# Patient Record
Sex: Female | Born: 1960 | Race: White | Hispanic: No | Marital: Single | Smoking: Former smoker
Health system: Southern US, Community
[De-identification: ages and names within clinical notes are randomized; demographics above are authoritative.]

## PROBLEM LIST (undated history)

## (undated) DIAGNOSIS — Z972 Presence of dental prosthetic device (complete) (partial): Secondary | ICD-10-CM

## (undated) DIAGNOSIS — R519 Headache, unspecified: Secondary | ICD-10-CM

## (undated) DIAGNOSIS — G40309 Generalized idiopathic epilepsy and epileptic syndromes, not intractable, without status epilepticus: Secondary | ICD-10-CM

## (undated) DIAGNOSIS — G20 Parkinson's disease: Secondary | ICD-10-CM

## (undated) DIAGNOSIS — R51 Headache: Secondary | ICD-10-CM

## (undated) DIAGNOSIS — F329 Major depressive disorder, single episode, unspecified: Secondary | ICD-10-CM

## (undated) DIAGNOSIS — F509 Eating disorder, unspecified: Secondary | ICD-10-CM

## (undated) DIAGNOSIS — F32A Depression, unspecified: Secondary | ICD-10-CM

## (undated) DIAGNOSIS — Z889 Allergy status to unspecified drugs, medicaments and biological substances status: Secondary | ICD-10-CM

## (undated) DIAGNOSIS — F319 Bipolar disorder, unspecified: Secondary | ICD-10-CM

## (undated) DIAGNOSIS — G20C Parkinsonism, unspecified: Secondary | ICD-10-CM

## (undated) DIAGNOSIS — G629 Polyneuropathy, unspecified: Secondary | ICD-10-CM

## (undated) DIAGNOSIS — G250 Essential tremor: Secondary | ICD-10-CM

## (undated) DIAGNOSIS — K219 Gastro-esophageal reflux disease without esophagitis: Secondary | ICD-10-CM

## (undated) DIAGNOSIS — R109 Unspecified abdominal pain: Secondary | ICD-10-CM

## (undated) DIAGNOSIS — Z85828 Personal history of other malignant neoplasm of skin: Secondary | ICD-10-CM

## (undated) DIAGNOSIS — G20A1 Parkinson's disease without dyskinesia, without mention of fluctuations: Secondary | ICD-10-CM

## (undated) DIAGNOSIS — J449 Chronic obstructive pulmonary disease, unspecified: Secondary | ICD-10-CM

## (undated) DIAGNOSIS — C4491 Basal cell carcinoma of skin, unspecified: Secondary | ICD-10-CM

## (undated) DIAGNOSIS — I10 Essential (primary) hypertension: Secondary | ICD-10-CM

## (undated) DIAGNOSIS — J45909 Unspecified asthma, uncomplicated: Secondary | ICD-10-CM

## (undated) DIAGNOSIS — C801 Malignant (primary) neoplasm, unspecified: Secondary | ICD-10-CM

## (undated) DIAGNOSIS — L309 Dermatitis, unspecified: Secondary | ICD-10-CM

## (undated) DIAGNOSIS — F419 Anxiety disorder, unspecified: Secondary | ICD-10-CM

## (undated) DIAGNOSIS — Z5189 Encounter for other specified aftercare: Secondary | ICD-10-CM

## (undated) DIAGNOSIS — Z8489 Family history of other specified conditions: Secondary | ICD-10-CM

## (undated) HISTORY — PX: OTHER SURGICAL HISTORY: SHX169

## (undated) HISTORY — PX: OOPHORECTOMY: SHX86

## (undated) HISTORY — PX: CERVICAL FUSION: SHX112

## (undated) HISTORY — PX: COLONOSCOPY WITH ESOPHAGOGASTRODUODENOSCOPY (EGD): SHX5779

## (undated) HISTORY — PX: GRAFT APPLICATION: SHX6696

## (undated) HISTORY — PX: CATARACT EXTRACTION W/ INTRAOCULAR LENS IMPLANT: SHX1309

## (undated) HISTORY — PX: ADENOIDECTOMY: SUR15

## (undated) HISTORY — PX: NEUROPLASTY / TRANSPOSITION ULNAR NERVE AT ELBOW: SUR895

## (undated) HISTORY — PX: SKIN CANCER EXCISION: SHX779

## (undated) HISTORY — PX: LAPAROSCOPIC BILATERAL SALPINGO OOPHERECTOMY: SHX5890

## (undated) HISTORY — PX: COLONOSCOPY: SHX174

## (undated) HISTORY — PX: BLADDER SURGERY: SHX569

## (undated) HISTORY — PX: TONSILLECTOMY: SUR1361

## (undated) HISTORY — PX: TUBAL LIGATION: SHX77

## (undated) HISTORY — PX: ABDOMINAL HYSTERECTOMY: SHX81

## (undated) HISTORY — PX: EYE SURGERY: SHX253

---

## 2004-09-15 ENCOUNTER — Emergency Department: Payer: Self-pay | Admitting: Internal Medicine

## 2004-10-03 ENCOUNTER — Ambulatory Visit: Payer: Self-pay | Admitting: Obstetrics and Gynecology

## 2005-01-20 ENCOUNTER — Ambulatory Visit: Payer: Self-pay | Admitting: Internal Medicine

## 2005-02-20 ENCOUNTER — Ambulatory Visit: Payer: Self-pay | Admitting: Unknown Physician Specialty

## 2005-03-24 ENCOUNTER — Ambulatory Visit: Payer: Self-pay | Admitting: Cardiovascular Disease

## 2005-03-26 ENCOUNTER — Ambulatory Visit: Payer: Self-pay | Admitting: Cardiovascular Disease

## 2005-06-06 ENCOUNTER — Emergency Department: Payer: Self-pay | Admitting: Emergency Medicine

## 2006-01-06 ENCOUNTER — Ambulatory Visit: Payer: Self-pay | Admitting: Oncology

## 2006-01-15 LAB — CBC WITH DIFFERENTIAL (CANCER CENTER ONLY)
BASO#: 0.1 10*3/uL (ref 0.0–0.2)
EOS%: 1.5 % (ref 0.0–7.0)
Eosinophils Absolute: 0.2 10*3/uL (ref 0.0–0.5)
HGB: 13.1 g/dL (ref 11.6–15.9)
LYMPH#: 2.7 10*3/uL (ref 0.9–3.3)
NEUT#: 6.4 10*3/uL (ref 1.5–6.5)
RBC: 4.4 10*6/uL (ref 3.70–5.32)
WBC: 9.7 10*3/uL (ref 3.9–10.0)

## 2006-01-15 LAB — COMPREHENSIVE METABOLIC PANEL
AST: 21 U/L (ref 0–37)
Albumin: 3.9 g/dL (ref 3.5–5.2)
Alkaline Phosphatase: 90 U/L (ref 39–117)
BUN: 2 mg/dL — ABNORMAL LOW (ref 6–23)
Potassium: 3.7 mEq/L (ref 3.5–5.3)

## 2006-01-15 LAB — MORPHOLOGY - CHCC SATELLITE
PLT EST ~~LOC~~: ADEQUATE
Platelet Morphology: NORMAL

## 2006-01-15 LAB — IRON AND TIBC
TIBC: 359 ug/dL (ref 250–470)
UIBC: 292 ug/dL

## 2006-01-15 LAB — FERRITIN: Ferritin: 36 ng/mL (ref 10–291)

## 2006-01-17 LAB — LEUKOCYTE ALKALINE PHOS: Leukocyte Alkaline  Phos Stain: 88 (ref 33–149)

## 2006-01-27 ENCOUNTER — Ambulatory Visit: Payer: Self-pay | Admitting: Internal Medicine

## 2006-02-25 ENCOUNTER — Ambulatory Visit: Payer: Self-pay | Admitting: Oncology

## 2006-02-26 LAB — CBC WITH DIFFERENTIAL (CANCER CENTER ONLY)
BASO%: 0.7 % (ref 0.0–2.0)
EOS%: 2 % (ref 0.0–7.0)
HGB: 11.3 g/dL — ABNORMAL LOW (ref 11.6–15.9)
LYMPH#: 2.2 10*3/uL (ref 0.9–3.3)
MCHC: 33.2 g/dL (ref 32.0–36.0)
NEUT#: 5.1 10*3/uL (ref 1.5–6.5)
RDW: 13.7 % (ref 10.5–14.6)

## 2006-09-25 ENCOUNTER — Ambulatory Visit: Payer: Self-pay | Admitting: Unknown Physician Specialty

## 2007-02-02 ENCOUNTER — Ambulatory Visit: Payer: Self-pay | Admitting: Internal Medicine

## 2007-05-26 ENCOUNTER — Ambulatory Visit: Payer: Self-pay | Admitting: Internal Medicine

## 2007-07-29 ENCOUNTER — Ambulatory Visit: Payer: Self-pay | Admitting: Unknown Physician Specialty

## 2007-08-01 ENCOUNTER — Emergency Department: Payer: Self-pay | Admitting: Emergency Medicine

## 2007-08-06 ENCOUNTER — Emergency Department: Payer: Self-pay | Admitting: Emergency Medicine

## 2007-09-02 ENCOUNTER — Ambulatory Visit: Payer: Self-pay | Admitting: Emergency Medicine

## 2007-09-13 ENCOUNTER — Emergency Department: Payer: Self-pay | Admitting: Internal Medicine

## 2007-09-13 ENCOUNTER — Other Ambulatory Visit: Payer: Self-pay

## 2007-09-20 ENCOUNTER — Ambulatory Visit: Payer: Self-pay | Admitting: Family Medicine

## 2007-10-04 ENCOUNTER — Emergency Department: Payer: Self-pay | Admitting: Emergency Medicine

## 2007-11-18 ENCOUNTER — Ambulatory Visit: Payer: Self-pay | Admitting: Unknown Physician Specialty

## 2007-11-25 ENCOUNTER — Ambulatory Visit: Payer: Self-pay | Admitting: Obstetrics & Gynecology

## 2007-12-03 ENCOUNTER — Inpatient Hospital Stay: Payer: Self-pay | Admitting: Obstetrics & Gynecology

## 2008-02-04 ENCOUNTER — Ambulatory Visit: Payer: Self-pay | Admitting: Internal Medicine

## 2008-07-10 ENCOUNTER — Ambulatory Visit: Payer: Self-pay | Admitting: Internal Medicine

## 2008-12-27 ENCOUNTER — Ambulatory Visit: Payer: Self-pay | Admitting: Internal Medicine

## 2009-01-08 ENCOUNTER — Ambulatory Visit: Payer: Self-pay | Admitting: Unknown Physician Specialty

## 2009-01-19 ENCOUNTER — Ambulatory Visit: Payer: Self-pay | Admitting: Unknown Physician Specialty

## 2009-01-23 ENCOUNTER — Ambulatory Visit: Payer: Self-pay | Admitting: Unknown Physician Specialty

## 2009-02-08 ENCOUNTER — Ambulatory Visit: Payer: Self-pay | Admitting: Obstetrics & Gynecology

## 2009-03-01 ENCOUNTER — Emergency Department: Payer: Self-pay | Admitting: Emergency Medicine

## 2009-03-30 ENCOUNTER — Emergency Department: Payer: Self-pay | Admitting: Emergency Medicine

## 2009-03-30 ENCOUNTER — Ambulatory Visit: Payer: Self-pay | Admitting: Family Medicine

## 2009-09-03 ENCOUNTER — Ambulatory Visit: Payer: Self-pay

## 2009-09-11 ENCOUNTER — Ambulatory Visit: Payer: Self-pay

## 2009-10-12 ENCOUNTER — Ambulatory Visit: Payer: Self-pay

## 2009-11-05 ENCOUNTER — Encounter: Payer: Self-pay | Admitting: Rehabilitation

## 2009-11-11 ENCOUNTER — Encounter: Payer: Self-pay | Admitting: Rehabilitation

## 2009-11-11 ENCOUNTER — Ambulatory Visit: Payer: Self-pay

## 2009-12-12 ENCOUNTER — Encounter: Payer: Self-pay | Admitting: Rehabilitation

## 2010-01-11 ENCOUNTER — Encounter: Payer: Self-pay | Admitting: Rehabilitation

## 2010-03-12 ENCOUNTER — Ambulatory Visit: Payer: Self-pay

## 2010-03-14 ENCOUNTER — Ambulatory Visit: Payer: Self-pay

## 2010-04-13 ENCOUNTER — Ambulatory Visit: Payer: Self-pay

## 2010-05-02 ENCOUNTER — Emergency Department: Payer: Self-pay | Admitting: Emergency Medicine

## 2010-07-15 ENCOUNTER — Ambulatory Visit: Payer: Self-pay | Admitting: Internal Medicine

## 2010-12-26 ENCOUNTER — Ambulatory Visit: Payer: Self-pay | Admitting: Family Medicine

## 2011-01-17 DIAGNOSIS — F411 Generalized anxiety disorder: Secondary | ICD-10-CM | POA: Insufficient documentation

## 2011-01-17 DIAGNOSIS — G43009 Migraine without aura, not intractable, without status migrainosus: Secondary | ICD-10-CM | POA: Insufficient documentation

## 2011-04-14 ENCOUNTER — Ambulatory Visit: Payer: Self-pay | Admitting: Otolaryngology

## 2011-04-15 ENCOUNTER — Emergency Department: Payer: Self-pay | Admitting: *Deleted

## 2012-01-06 ENCOUNTER — Ambulatory Visit: Payer: Self-pay | Admitting: Family Medicine

## 2012-01-19 ENCOUNTER — Ambulatory Visit: Payer: Self-pay | Admitting: Family Medicine

## 2012-02-16 ENCOUNTER — Ambulatory Visit: Payer: Self-pay | Admitting: Surgery

## 2012-03-01 ENCOUNTER — Ambulatory Visit: Payer: Self-pay | Admitting: Surgery

## 2012-03-01 ENCOUNTER — Emergency Department: Payer: Self-pay | Admitting: Emergency Medicine

## 2012-03-03 LAB — PATHOLOGY REPORT

## 2013-03-06 ENCOUNTER — Emergency Department: Payer: Self-pay | Admitting: Unknown Physician Specialty

## 2013-03-06 LAB — URINALYSIS, COMPLETE
Bacteria: NONE SEEN
Glucose,UR: NEGATIVE mg/dL (ref 0–75)
Nitrite: NEGATIVE
Ph: 5 (ref 4.5–8.0)
Protein: NEGATIVE
RBC,UR: <1 /HPF (ref 0–5)
Squamous Epithelial: NONE SEEN

## 2013-03-06 LAB — CBC
HCT: 37.4 % (ref 35.0–47.0)
MCHC: 35.1 g/dL (ref 32.0–36.0)
Platelet: 304 10*3/uL (ref 150–440)
RDW: 12.7 % (ref 11.5–14.5)

## 2013-03-06 LAB — COMPREHENSIVE METABOLIC PANEL
Albumin: 3.8 g/dL (ref 3.4–5.0)
Alkaline Phosphatase: 74 U/L (ref 50–136)
Anion Gap: 3 — ABNORMAL LOW (ref 7–16)
BUN: 13 mg/dL (ref 7–18)
Calcium, Total: 9 mg/dL (ref 8.5–10.1)
Chloride: 101 mmol/L (ref 98–107)
Co2: 30 mmol/L (ref 21–32)
Creatinine: 0.67 mg/dL (ref 0.60–1.30)
EGFR (Non-African Amer.): >60
Glucose: 84 mg/dL (ref 65–99)
Osmolality: 268 (ref 275–301)
Potassium: 4 mmol/L (ref 3.5–5.1)
SGOT(AST): 25 U/L (ref 15–37)
SGPT (ALT): 17 U/L (ref 12–78)
Sodium: 134 mmol/L — ABNORMAL LOW (ref 136–145)
Total Protein: 7.5 g/dL (ref 6.4–8.2)

## 2013-03-06 LAB — ETHANOL: Ethanol: <3 mg/dL

## 2013-03-06 LAB — TSH: Thyroid Stimulating Horm: 0.92 u[IU]/mL

## 2013-03-07 LAB — DRUG SCREEN, URINE
Barbiturates, Ur Screen: NEGATIVE (ref ?–200)
Cannabinoid 50 Ng, Ur ~~LOC~~: NEGATIVE (ref ?–50)
MDMA (Ecstasy)Ur Screen: NEGATIVE (ref ?–500)
Methadone, Ur Screen: NEGATIVE (ref ?–300)
Opiate, Ur Screen: NEGATIVE (ref ?–300)
Tricyclic, Ur Screen: POSITIVE (ref ?–1000)

## 2013-03-07 LAB — ACETAMINOPHEN LEVEL: Acetaminophen: <2 ug/mL

## 2013-03-07 LAB — SALICYLATE LEVEL: Salicylates, Serum: <1.7 mg/dL

## 2013-05-02 ENCOUNTER — Emergency Department: Payer: Self-pay | Admitting: Emergency Medicine

## 2013-07-28 DIAGNOSIS — J45909 Unspecified asthma, uncomplicated: Secondary | ICD-10-CM | POA: Insufficient documentation

## 2013-07-28 DIAGNOSIS — J449 Chronic obstructive pulmonary disease, unspecified: Secondary | ICD-10-CM | POA: Insufficient documentation

## 2014-01-02 DIAGNOSIS — R251 Tremor, unspecified: Secondary | ICD-10-CM | POA: Insufficient documentation

## 2014-03-08 ENCOUNTER — Ambulatory Visit: Payer: Self-pay | Admitting: Unknown Physician Specialty

## 2014-03-10 LAB — PATHOLOGY REPORT
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
## 2014-11-03 NOTE — Consult Note (Signed)
Brief Consult Note: Diagnosis: Schizoaffective disorder bipolar type.   Patient was seen by consultant.   Consult note dictated.   Recommend further assessment or treatment.   Orders entered.   Comments: Stacy Pearson has a long h/o mental illness. She called crisis line feeling suicidal with a plan to put a plastic bag over her head after an argument with her mother. She is no longer suicidal or homicidal.  PLAN: 1. The patient no longer meets criteria for IVC. I will terminate proceedings. Please discharge as appropriate.  2. She is to continue all medications as prescribed by Dr. Timmothy Euler. No Rx necessary.  3. She has appointment with Dr. Timmothy Euler on Oct 15 at 8:00. This will be moved up after she sees her therapist, Cristine Odom on 03/11/13 at 11:00.  Electronic Signatures: Orson Slick (MD)  (Signed 25-Aug-14 12:39)  Authored: Brief Consult Note   Last Updated: 25-Aug-14 12:39 by Orson Slick (MD)

## 2015-02-12 ENCOUNTER — Other Ambulatory Visit: Payer: Self-pay | Admitting: Family Medicine

## 2015-02-12 DIAGNOSIS — Z1231 Encounter for screening mammogram for malignant neoplasm of breast: Secondary | ICD-10-CM

## 2015-02-22 ENCOUNTER — Ambulatory Visit: Payer: Self-pay | Attending: Family Medicine

## 2015-07-04 DIAGNOSIS — M549 Dorsalgia, unspecified: Secondary | ICD-10-CM | POA: Insufficient documentation

## 2016-03-25 DIAGNOSIS — R61 Generalized hyperhidrosis: Secondary | ICD-10-CM | POA: Insufficient documentation

## 2016-03-25 DIAGNOSIS — L853 Xerosis cutis: Secondary | ICD-10-CM | POA: Insufficient documentation

## 2016-03-25 DIAGNOSIS — R238 Other skin changes: Secondary | ICD-10-CM | POA: Insufficient documentation

## 2016-08-19 DIAGNOSIS — G20 Parkinson's disease: Secondary | ICD-10-CM | POA: Insufficient documentation

## 2016-08-19 DIAGNOSIS — Z9181 History of falling: Secondary | ICD-10-CM | POA: Insufficient documentation

## 2016-08-19 DIAGNOSIS — R748 Abnormal levels of other serum enzymes: Secondary | ICD-10-CM | POA: Insufficient documentation

## 2016-08-19 DIAGNOSIS — R7989 Other specified abnormal findings of blood chemistry: Secondary | ICD-10-CM | POA: Insufficient documentation

## 2016-08-19 DIAGNOSIS — R112 Nausea with vomiting, unspecified: Secondary | ICD-10-CM | POA: Insufficient documentation

## 2016-08-25 DIAGNOSIS — G479 Sleep disorder, unspecified: Secondary | ICD-10-CM | POA: Insufficient documentation

## 2016-11-18 DIAGNOSIS — R252 Cramp and spasm: Secondary | ICD-10-CM | POA: Insufficient documentation

## 2016-12-17 DIAGNOSIS — J45909 Unspecified asthma, uncomplicated: Secondary | ICD-10-CM | POA: Insufficient documentation

## 2016-12-17 DIAGNOSIS — J3081 Allergic rhinitis due to animal (cat) (dog) hair and dander: Secondary | ICD-10-CM | POA: Insufficient documentation

## 2016-12-30 DIAGNOSIS — J309 Allergic rhinitis, unspecified: Secondary | ICD-10-CM | POA: Insufficient documentation

## 2016-12-30 DIAGNOSIS — T63461A Toxic effect of venom of wasps, accidental (unintentional), initial encounter: Secondary | ICD-10-CM | POA: Insufficient documentation

## 2017-09-03 DIAGNOSIS — M5412 Radiculopathy, cervical region: Secondary | ICD-10-CM | POA: Insufficient documentation

## 2017-10-08 DIAGNOSIS — K635 Polyp of colon: Secondary | ICD-10-CM | POA: Insufficient documentation

## 2017-10-08 DIAGNOSIS — G250 Essential tremor: Secondary | ICD-10-CM | POA: Insufficient documentation

## 2017-10-28 DIAGNOSIS — M4722 Other spondylosis with radiculopathy, cervical region: Secondary | ICD-10-CM | POA: Insufficient documentation

## 2017-12-31 DIAGNOSIS — Z981 Arthrodesis status: Secondary | ICD-10-CM | POA: Insufficient documentation

## 2018-03-08 ENCOUNTER — Ambulatory Visit
Admission: RE | Admit: 2018-03-08 | Discharge: 2018-03-08 | Disposition: A | Discharge status: Home / Self Care | Payer: Medicare Other | Source: Ambulatory Visit | Attending: Nurse Practitioner | Admitting: Nurse Practitioner

## 2018-03-08 ENCOUNTER — Other Ambulatory Visit: Payer: Self-pay | Admitting: Nurse Practitioner

## 2018-03-08 DIAGNOSIS — R197 Diarrhea, unspecified: Secondary | ICD-10-CM | POA: Insufficient documentation

## 2018-03-08 DIAGNOSIS — I700 Atherosclerosis of aorta: Secondary | ICD-10-CM | POA: Insufficient documentation

## 2018-03-08 DIAGNOSIS — R935 Abnormal findings on diagnostic imaging of other abdominal regions, including retroperitoneum: Secondary | ICD-10-CM | POA: Diagnosis not present

## 2018-03-08 DIAGNOSIS — R100 Acute abdomen: Secondary | ICD-10-CM | POA: Insufficient documentation

## 2018-03-08 DIAGNOSIS — R109 Unspecified abdominal pain: Secondary | ICD-10-CM

## 2018-03-08 HISTORY — DX: Malignant (primary) neoplasm, unspecified: C80.1

## 2018-03-08 HISTORY — DX: Unspecified asthma, uncomplicated: J45.909

## 2018-03-08 MED ORDER — IOHEXOL 300 MG/ML  SOLN
80.0000 mL | Freq: Once | INTRAMUSCULAR | Status: AC | PRN
  Administered 2018-03-08: 80 mL via INTRAVENOUS

## 2018-03-09 ENCOUNTER — Other Ambulatory Visit
Admission: RE | Admit: 2018-03-09 | Discharge: 2018-03-09 | Disposition: A | Discharge status: Home / Self Care | Payer: Medicare Other | Source: Ambulatory Visit | Attending: Nurse Practitioner | Admitting: Nurse Practitioner

## 2018-03-09 DIAGNOSIS — R197 Diarrhea, unspecified: Secondary | ICD-10-CM | POA: Insufficient documentation

## 2018-03-09 DIAGNOSIS — R1084 Generalized abdominal pain: Secondary | ICD-10-CM | POA: Diagnosis present

## 2018-03-09 LAB — GASTROINTESTINAL PANEL BY PCR, STOOL (REPLACES STOOL CULTURE)

## 2018-03-09 LAB — C DIFFICILE QUICK SCREEN W PCR REFLEX
C Diff antigen: NEGATIVE
C Diff interpretation: NOT DETECTED
C Diff toxin: NEGATIVE

## 2018-03-19 ENCOUNTER — Other Ambulatory Visit
Admission: RE | Admit: 2018-03-19 | Discharge: 2018-03-19 | Disposition: A | Discharge status: Home / Self Care | Payer: Medicare Other | Source: Ambulatory Visit | Attending: Nurse Practitioner | Admitting: Nurse Practitioner

## 2018-03-19 DIAGNOSIS — R197 Diarrhea, unspecified: Secondary | ICD-10-CM | POA: Insufficient documentation

## 2018-03-19 LAB — LACTOFERRIN, FECAL, QUALITATIVE: Lactoferrin, Fecal, Qual: NEGATIVE

## 2018-03-22 LAB — CALPROTECTIN, FECAL: Calprotectin, Fecal: <16 ug/g (ref 0–120)

## 2018-04-27 ENCOUNTER — Encounter: Payer: Self-pay | Admitting: *Deleted

## 2018-04-28 ENCOUNTER — Ambulatory Visit: Payer: Medicare Other | Admitting: Anesthesiology

## 2018-04-28 ENCOUNTER — Encounter: Payer: Self-pay | Admitting: *Deleted

## 2018-04-28 ENCOUNTER — Encounter
Admission: RE | Disposition: A | Discharge status: Home / Self Care | Payer: Self-pay | Source: Ambulatory Visit | Attending: Unknown Physician Specialty

## 2018-04-28 ENCOUNTER — Ambulatory Visit
Admission: RE | Admit: 2018-04-28 | Discharge: 2018-04-28 | Disposition: A | Discharge status: Home / Self Care | Payer: Medicare Other | Source: Ambulatory Visit | Attending: Unknown Physician Specialty | Admitting: Unknown Physician Specialty

## 2018-04-28 DIAGNOSIS — K219 Gastro-esophageal reflux disease without esophagitis: Secondary | ICD-10-CM | POA: Diagnosis not present

## 2018-04-28 DIAGNOSIS — G20 Parkinson's disease: Secondary | ICD-10-CM | POA: Diagnosis not present

## 2018-04-28 DIAGNOSIS — Z87891 Personal history of nicotine dependence: Secondary | ICD-10-CM | POA: Insufficient documentation

## 2018-04-28 DIAGNOSIS — Z1211 Encounter for screening for malignant neoplasm of colon: Secondary | ICD-10-CM | POA: Insufficient documentation

## 2018-04-28 DIAGNOSIS — Z885 Allergy status to narcotic agent status: Secondary | ICD-10-CM | POA: Diagnosis not present

## 2018-04-28 DIAGNOSIS — F319 Bipolar disorder, unspecified: Secondary | ICD-10-CM | POA: Insufficient documentation

## 2018-04-28 DIAGNOSIS — D123 Benign neoplasm of transverse colon: Secondary | ICD-10-CM | POA: Diagnosis not present

## 2018-04-28 DIAGNOSIS — Z85828 Personal history of other malignant neoplasm of skin: Secondary | ICD-10-CM | POA: Diagnosis not present

## 2018-04-28 DIAGNOSIS — D122 Benign neoplasm of ascending colon: Secondary | ICD-10-CM | POA: Insufficient documentation

## 2018-04-28 DIAGNOSIS — J45909 Unspecified asthma, uncomplicated: Secondary | ICD-10-CM | POA: Diagnosis not present

## 2018-04-28 DIAGNOSIS — Z79899 Other long term (current) drug therapy: Secondary | ICD-10-CM | POA: Insufficient documentation

## 2018-04-28 DIAGNOSIS — K5730 Diverticulosis of large intestine without perforation or abscess without bleeding: Secondary | ICD-10-CM | POA: Insufficient documentation

## 2018-04-28 DIAGNOSIS — Z981 Arthrodesis status: Secondary | ICD-10-CM | POA: Diagnosis not present

## 2018-04-28 DIAGNOSIS — Z888 Allergy status to other drugs, medicaments and biological substances status: Secondary | ICD-10-CM | POA: Insufficient documentation

## 2018-04-28 HISTORY — DX: Parkinson's disease: G20

## 2018-04-28 HISTORY — DX: Gastro-esophageal reflux disease without esophagitis: K21.9

## 2018-04-28 HISTORY — DX: Parkinson's disease without dyskinesia, without mention of fluctuations: G20.A1

## 2018-04-28 HISTORY — DX: Bipolar disorder, unspecified: F31.9

## 2018-04-28 HISTORY — PX: COLONOSCOPY WITH PROPOFOL: SHX5780

## 2018-04-28 SURGERY — COLONOSCOPY WITH PROPOFOL
Anesthesia: General

## 2018-04-28 MED ORDER — SODIUM CHLORIDE 0.9 % IV SOLN
INTRAVENOUS | Status: DC
  Administered 2018-04-28: 1000 mL via INTRAVENOUS

## 2018-04-28 MED ORDER — EPHEDRINE SULFATE 50 MG/ML IJ SOLN
INTRAMUSCULAR | Status: DC | PRN
  Administered 2018-04-28 (×4): 5 mg via INTRAVENOUS

## 2018-04-28 MED ORDER — PHENYLEPHRINE HCL 10 MG/ML IJ SOLN
INTRAMUSCULAR | Status: DC | PRN
  Administered 2018-04-28: 50 ug via INTRAVENOUS
  Administered 2018-04-28 (×2): 100 ug via INTRAVENOUS

## 2018-04-28 MED ORDER — FENTANYL CITRATE (PF) 100 MCG/2ML IJ SOLN
INTRAMUSCULAR | Status: DC | PRN
  Administered 2018-04-28 (×2): 50 ug via INTRAVENOUS

## 2018-04-28 MED ORDER — GLYCOPYRROLATE 0.2 MG/ML IJ SOLN
INTRAMUSCULAR | Status: DC | PRN
  Administered 2018-04-28 (×2): 0.2 mg via INTRAVENOUS

## 2018-04-28 MED ORDER — HYDROCORTISONE NA SUCCINATE PF 100 MG IJ SOLR
INTRAMUSCULAR | Status: DC | PRN
  Administered 2018-04-28: 100 mg via INTRAVENOUS

## 2018-04-28 MED ORDER — PROPOFOL 500 MG/50ML IV EMUL
INTRAVENOUS | Status: DC | PRN
  Administered 2018-04-28: 120 ug/kg/min via INTRAVENOUS

## 2018-04-28 MED ORDER — SODIUM CHLORIDE 0.9 % IV SOLN: INTRAVENOUS | Status: DC

## 2018-04-28 MED ORDER — PROPOFOL 10 MG/ML IV BOLUS
INTRAVENOUS | Status: DC | PRN
  Administered 2018-04-28: 70 mg via INTRAVENOUS

## 2018-04-28 MED ORDER — EPINEPHRINE PF 1 MG/10ML IJ SOSY
PREFILLED_SYRINGE | INTRAMUSCULAR | Status: DC | PRN
  Administered 2018-04-28 (×6): 10 ug via INTRAVENOUS

## 2018-04-28 MED ORDER — FLUMAZENIL 0.5 MG/5ML IV SOLN
INTRAVENOUS | Status: AC
  Filled 2018-04-28: qty 5

## 2018-04-28 MED ORDER — MIDAZOLAM HCL 5 MG/5ML IJ SOLN
INTRAMUSCULAR | Status: DC | PRN
  Administered 2018-04-28: 1 mg via INTRAVENOUS

## 2018-04-28 NOTE — Anesthesia Preprocedure Evaluation (Signed)
Anesthesia Evaluation  Patient identified by MRN, date of birth, ID band Patient awake    Reviewed: Allergy & Precautions, H&P , NPO status , Patient's Chart, lab work & pertinent test results  History of Anesthesia Complications Negative for: history of anesthetic complications  Airway Mallampati: III  TM Distance: >3 FB Neck ROM: full    Dental  (+) Upper Dentures, Lower Dentures   Pulmonary neg shortness of breath, asthma , former smoker,           Cardiovascular Exercise Tolerance: Good (-) angina(-) Past MI and (-) DOE negative cardio ROS       Neuro/Psych PSYCHIATRIC DISORDERS negative neurological ROS     GI/Hepatic Neg liver ROS, GERD  Medicated and Controlled,  Endo/Other  negative endocrine ROS  Renal/GU negative Renal ROS  negative genitourinary   Musculoskeletal   Abdominal   Peds  Hematology negative hematology ROS (+)   Anesthesia Other Findings Past Medical History: No date: Asthma No date: Bipolar disorder (HCC) No date: Cancer ([place])     Comment:  MOS surgery basal cell face No date: GERD (gastroesophageal reflux disease) No date: Parkinson's disease ([place])  Past Surgical History: No date: ABDOMINAL HYSTERECTOMY No date: BLADDER SURGERY No date: CATARACT EXTRACTION W/ INTRAOCULAR LENS IMPLANT No date: CERVICAL FUSION No date: COLONOSCOPY No date: COLONOSCOPY WITH ESOPHAGOGASTRODUODENOSCOPY (EGD) No date: EYE SURGERY No date: GRAFT APPLICATION     Comment:  in neck No date: LAPAROSCOPIC BILATERAL SALPINGO OOPHERECTOMY No date: NEUROPLASTY / TRANSPOSITION ULNAR NERVE AT ELBOW No date: SKIN CANCER EXCISION left forearm; hand bil.lower extremeties: skin grafts  No date: titanium plate No date: TONSILLECTOMY No date: TUBAL LIGATION     Reproductive/Obstetrics negative OB ROS                             Anesthesia Physical Anesthesia Plan  ASA:  III  Anesthesia Plan: General   Post-op Pain Management:    Induction: Intravenous  PONV Risk Score and Plan: Propofol infusion and TIVA  Airway Management Planned: Natural Airway and Nasal Cannula  Additional Equipment:   Intra-op Plan:   Post-operative Plan:   Informed Consent: I have reviewed the patients History and Physical, chart, labs and discussed the procedure including the risks, benefits and alternatives for the proposed anesthesia with the patient or authorized representative who has indicated his/her understanding and acceptance.   Dental Advisory Given  Plan Discussed with: Anesthesiologist, CRNA and Surgeon  Anesthesia Plan Comments: (Patient consented for risks of anesthesia including but not limited to:  - adverse reactions to medications - risk of intubation if required - damage to teeth, lips or other oral mucosa - sore throat or hoarseness - Damage to heart, brain, lungs or loss of life  Patient voiced understanding.)        Anesthesia Quick Evaluation

## 2018-04-28 NOTE — H&P (Signed)
Primary Care Physician:  Valera Castle, MD Primary Gastroenterologist:  Dr. Vira Agar  Pre-Procedure History & Physical: HPI:  Stacy Pearson is a 58 y.o. female is here for an colonoscopy.   Past Medical History:  Diagnosis Date  . Asthma   . Bipolar disorder ([place])   . Cancer ([place])    MOS surgery basal cell face  . GERD (gastroesophageal reflux disease)   . Parkinson's disease [facility])     Past Surgical History:  Procedure Laterality Date  . ABDOMINAL HYSTERECTOMY    . BLADDER SURGERY    . CATARACT EXTRACTION W/ INTRAOCULAR LENS IMPLANT    . CERVICAL FUSION    . COLONOSCOPY    . COLONOSCOPY WITH ESOPHAGOGASTRODUODENOSCOPY (EGD)    . EYE SURGERY    . GRAFT APPLICATION     in neck  . LAPAROSCOPIC BILATERAL SALPINGO OOPHERECTOMY    . NEUROPLASTY / TRANSPOSITION ULNAR NERVE AT ELBOW    . SKIN CANCER EXCISION    . skin grafts   left forearm; hand bil.lower extremeties  . titanium plate    . TONSILLECTOMY    . TUBAL LIGATION      Prior to Admission medications   Medication Sig Start Date End Date Taking? Authorizing Provider  azelastine (ASTELIN) 0.1 % nasal spray Place into both nostrils 2 (two) times daily. Use in each nostril as directed   Yes [provider]  carbidopa-levodopa (SINEMET IR) 25-100 MG tablet Take 1 tablet by mouth 3 (three) times daily.   Yes [provider]  fluticasone-salmeterol (ADVAIR HFA) 230-21 MCG/ACT inhaler Inhale 2 puffs into the lungs 2 (two) times daily.   Yes [provider]  Ipratropium-Albuterol (COMBIVENT RESPIMAT) 20-100 MCG/ACT AERS respimat Inhale 1 puff into the lungs every 6 (six) hours.   Yes [provider]  magnesium oxide (MAG-OX) 400 MG tablet Take 400 mg by mouth daily.   Yes [provider]  Multiple Vitamin (MULTIVITAMIN) capsule Take 1 capsule by mouth daily.   Yes [provider]  omega-3 acid ethyl esters (LOVAZA) 1 g capsule Take by mouth 2 (two) times daily.    Yes [provider]  ondansetron (ZOFRAN) 8 MG tablet Take by mouth every 8 (eight) hours as needed for nausea or vomiting.   Yes [provider]  predniSONE (DELTASONE) 1 MG tablet Take 1 mg by mouth daily with breakfast.   Yes [provider]  predniSONE (DELTASONE) 10 MG tablet Take 10 mg by mouth daily with breakfast.   Yes [provider]  rOPINIRole (REQUIP XL) 8 MG 24 hr tablet Take 8 mg by mouth at bedtime.   Yes [provider]  EPINEPHrine 0.3 mg/0.3 mL IJ SOAJ injection Inject into the muscle once.    [provider]  fluconazole (DIFLUCAN) 200 MG tablet Take 200 mg by mouth daily.    [provider]  montelukast (SINGULAIR) 10 MG tablet Take 10 mg by mouth at bedtime.    [provider]  naproxen (NAPROSYN) 500 MG tablet Take 500 mg by mouth 2 (two) times daily with a meal.    [provider]  SUMAtriptan (IMITREX) 50 MG tablet Take 50 mg by mouth every 2 (two) hours as needed for migraine. May repeat in 2 hours if headache persists or recurs.    [provider]    Allergies as of 04/02/2018 - Review Complete 03/08/2018  Allergen Reaction Noted  . Codeine Nausea And Vomiting 03/08/2018    History reviewed.  No pertinent family history.  Social History   Socioeconomic History  . Marital status: Single    Spouse name: Not on file  . Number of children: Not on file  . Years of education: Not on file  . Highest education level: Not on file  Occupational History  . Not on file  Social Needs  . Financial resource strain: Not on file  . Food insecurity:    Worry: Not on file    Inability: Not on file  . Transportation needs:    Medical: Not on file    Non-medical: Not on file  Tobacco Use  . Smoking status: Former Smoker    Types: Cigarettes    Last attempt to quit: 07/14/2006    Years since quitting: 11.7  . Smokeless tobacco: Never Used  Substance and Sexual Activity  . Alcohol  use: Never    Frequency: Never  . Drug use: Never  . Sexual activity: Not on file  Lifestyle  . Physical activity:    Days per week: Not on file    Minutes per session: Not on file  . Stress: Not on file  Relationships  . Social connections:    Talks on phone: Not on file    Gets together: Not on file    Attends religious service: Not on file    Active member of club or organization: Not on file    Attends meetings of clubs or organizations: Not on file    Relationship status: Not on file  . Intimate partner violence:    Fear of current or ex partner: Not on file    Emotionally abused: Not on file    Physically abused: Not on file    Forced sexual activity: Not on file  Other Topics Concern  . Not on file  Social History Narrative  . Not on file    Review of Systems: See HPI, otherwise negative ROS  Physical Exam: BP (!) 88/60   Pulse 62   Temp 98.6 F (37 C) (Tympanic)   Resp 20   Ht 5' 2.5" (1.588 m)   Wt 54 kg   SpO2 98%   BMI 21.42 kg/m  General:   Alert,  pleasant and cooperative in NAD Head:  Normocephalic and atraumatic. Neck:  Supple; no masses or thyromegaly. Lungs:  Clear throughout to auscultation.    Heart:  Regular rate and rhythm. Abdomen:  Soft, nontender and nondistended. Normal bowel sounds, without guarding, and without rebound.   Neurologic:  Alert and  oriented x4;  grossly normal neurologically.  Impression/Plan: Stacy Pearson is here for an colonoscopy to be performed for [facility] colon polyps.  Last one 03/08/14  Risks, benefits, limitations, and alternatives regarding  colonoscopy have been reviewed with the patient.  Questions have been answered.  All parties agreeable.   Gaylyn Cheers, MD  04/28/2018, 1:21 PM

## 2018-04-28 NOTE — Anesthesia Post-op Follow-up Note (Signed)
Anesthesia QCDR form completed.        

## 2018-04-28 NOTE — Transfer of Care (Signed)
Immediate Anesthesia Transfer of Care Note  Patient: Stacy Pearson  Procedure(s) Performed: COLONOSCOPY WITH PROPOFOL (N/A )  Patient Location: PACU  Anesthesia Type:General  Level of Consciousness: awake and patient cooperative  Airway & Oxygen Therapy: Patient Spontanous Breathing and Patient connected to nasal cannula oxygen  Post-op Assessment: Report given to RN and Post -op Vital signs reviewed and stable  Post vital signs: Reviewed and stable  Last Vitals:  Vitals Value Taken Time  BP 86/60 04/28/2018  2:26 PM  Temp 36.1 C 04/28/2018  2:17 PM  Pulse 57 04/28/2018  2:27 PM  Resp 17 04/28/2018  2:28 PM  SpO2 95 % 04/28/2018  2:27 PM  Vitals shown include unvalidated device data.  Last Pain:  Vitals:   04/28/18 1417  TempSrc: Tympanic  PainSc: 0-No pain         Complications: No apparent anesthesia complications

## 2018-04-28 NOTE — Op Note (Signed)
[facility] Gastroenterology Patient Name: Stacy Pearson Procedure Date: 04/28/2018 1:02 PM MRN: [id number] Account #: [id number] Date of Birth: 1960-07-22 Admit Type: Outpatient Age: 57 Room: [facility] ENDO ROOM 2 Gender: Female Note Status: Finalized Procedure:            Colonoscopy Indications:          Screening for colorectal malignant neoplasm Providers:            Manya Silvas, MD Referring MD:         Valera Castle (Referring MD) Medicines:            Propofol per Anesthesia Complications:        No immediate complications. Procedure:            Pre-Anesthesia Assessment:                       - After reviewing the risks and benefits, the patient                        was deemed in satisfactory condition to undergo the                        procedure.                       After obtaining informed consent, the colonoscope was                        passed under direct vision. Throughout the procedure,                        the patient's blood pressure, pulse, and oxygen                        saturations were monitored continuously. The                        Colonoscope was introduced through the anus and                        advanced to the the terminal ileum. The patient                        tolerated the procedure well. The quality of the bowel                        preparation was adequate to identify polyps. Findings:      Three sessile polyps were found in the hepatic flexure and ascending       colon. The polyps were small in size. These polyps were removed with a       hot snare. Resection and retrieval were complete.      Multiple small-mouthed diverticula were found in the sigmoid colon and       descending colon.      The terminal ileum appeared normal. This was entered for 10cm with       normal small bowel seen.      The colon had a fair amount of liquid stool we suctioned out and made       the scope clog 3  times. Impression:           -  Three small polyps at the hepatic flexure and in the                        ascending colon, removed with a hot snare. Resected and                        retrieved.                       - Diverticulosis in the sigmoid colon and in the                        descending colon.                       - The examined portion of the ileum was normal. Fluid                        aspiration performed. Recommendation:       - Await pathology results. Manya Silvas, MD 04/28/2018 2:17:36 PM This report has been signed electronically. Number of Addenda: 0 Note Initiated On: 04/28/2018 1:02 PM Scope Withdrawal Time: 0 hours 17 minutes 45 seconds  Total Procedure Duration: 0 hours 34 minutes 29 seconds       [facility]

## 2018-04-28 NOTE — Anesthesia Postprocedure Evaluation (Signed)
Anesthesia Post Note  Patient: Stacy Pearson  Procedure(s) Performed: COLONOSCOPY WITH PROPOFOL (N/A )  Patient location during evaluation: Endoscopy Anesthesia Type: General Level of consciousness: awake and alert Pain management: pain level controlled Vital Signs Assessment: post-procedure vital signs reviewed and stable Respiratory status: spontaneous breathing, nonlabored ventilation, respiratory function stable and patient connected to nasal cannula oxygen Cardiovascular status: blood pressure returned to baseline and stable Postop Assessment: no apparent nausea or vomiting Anesthetic complications: no     Last Vitals:  Vitals:   04/28/18 1427 04/28/18 1437  BP: (!) 86/60 (!) 96/58  Pulse: (!) 57   Resp: 16 12  Temp:    SpO2: 95%     Last Pain:  Vitals:   04/28/18 1437  TempSrc:   PainSc: 0-No pain                 Precious Haws Piscitello

## 2018-04-29 ENCOUNTER — Encounter: Payer: Self-pay | Admitting: Unknown Physician Specialty

## 2018-04-30 LAB — SURGICAL PATHOLOGY

## 2018-05-05 ENCOUNTER — Other Ambulatory Visit: Payer: Self-pay | Admitting: Nurse Practitioner

## 2018-05-05 DIAGNOSIS — R1011 Right upper quadrant pain: Secondary | ICD-10-CM

## 2018-05-05 DIAGNOSIS — K529 Noninfective gastroenteritis and colitis, unspecified: Secondary | ICD-10-CM

## 2018-05-27 ENCOUNTER — Ambulatory Visit: Payer: Medicare Other

## 2018-06-17 ENCOUNTER — Encounter: Payer: Self-pay | Admitting: Student

## 2018-06-18 ENCOUNTER — Encounter: Admission: RE | Payer: Self-pay | Source: Ambulatory Visit

## 2018-06-18 ENCOUNTER — Ambulatory Visit
Admission: RE | Admit: 2018-06-18 | Payer: Medicare Other | Source: Ambulatory Visit | Admitting: Unknown Physician Specialty

## 2018-06-18 HISTORY — DX: Encounter for other specified aftercare: Z51.89

## 2018-06-18 HISTORY — DX: Major depressive disorder, single episode, unspecified: F32.9

## 2018-06-18 HISTORY — DX: Parkinson's disease: G20

## 2018-06-18 HISTORY — DX: Parkinsonism, unspecified: G20.C

## 2018-06-18 HISTORY — DX: Polyneuropathy, unspecified: G62.9

## 2018-06-18 HISTORY — DX: Chronic obstructive pulmonary disease, unspecified: J44.9

## 2018-06-18 HISTORY — DX: Basal cell carcinoma of skin, unspecified: C44.91

## 2018-06-18 HISTORY — DX: Depression, unspecified: F32.A

## 2018-06-18 HISTORY — DX: Allergy status to unspecified drugs, medicaments and biological substances: Z88.9

## 2018-06-18 HISTORY — DX: Essential tremor: G25.0

## 2018-06-18 HISTORY — DX: Dermatitis, unspecified: L30.9

## 2018-06-18 HISTORY — DX: Headache: R51

## 2018-06-18 HISTORY — DX: Generalized idiopathic epilepsy and epileptic syndromes, not intractable, without status epilepticus: G40.309

## 2018-06-18 HISTORY — DX: Personal history of other malignant neoplasm of skin: Z85.828

## 2018-06-18 HISTORY — DX: Unspecified abdominal pain: R10.9

## 2018-06-18 HISTORY — DX: Headache, unspecified: R51.9

## 2018-06-18 HISTORY — DX: Essential (primary) hypertension: I10

## 2018-06-18 HISTORY — DX: Eating disorder, unspecified: F50.9

## 2018-06-18 HISTORY — DX: Anxiety disorder, unspecified: F41.9

## 2018-06-18 SURGERY — ESOPHAGOGASTRODUODENOSCOPY (EGD) WITH PROPOFOL
Anesthesia: General

## 2020-04-03 ENCOUNTER — Ambulatory Visit (INDEPENDENT_AMBULATORY_CARE_PROVIDER_SITE_OTHER): Payer: Medicare Other | Admitting: Podiatry

## 2020-04-03 ENCOUNTER — Other Ambulatory Visit: Payer: Self-pay

## 2020-04-03 ENCOUNTER — Ambulatory Visit (INDEPENDENT_AMBULATORY_CARE_PROVIDER_SITE_OTHER): Payer: Medicare Other

## 2020-04-03 DIAGNOSIS — M7742 Metatarsalgia, left foot: Secondary | ICD-10-CM | POA: Diagnosis not present

## 2020-04-03 DIAGNOSIS — M722 Plantar fascial fibromatosis: Secondary | ICD-10-CM

## 2020-04-03 DIAGNOSIS — M7741 Metatarsalgia, right foot: Secondary | ICD-10-CM

## 2020-04-03 NOTE — Progress Notes (Signed)
   Subjective: 59 y.o. female very active presenting today as a new patient for evaluation of bilateral foot pain.  Patient states that she has had pain for several years now.  She has tried custom orthotics with minimal relief.  She is not interested in any oral medication or treatment.  She denies history of injury.  She is very active and states that she normally walks 8 miles per day.  Currently because of her foot pain she is only able to walk to-2.5 miles per day.  She has been diagnosed with Parkinson's disease and she tries to stay very active.  She presents for further treatment evaluation   Past Medical History:  Diagnosis Date  . Abdominal pain   . Allergic genetic state   . Anxiety   . Asthma   . Basal cell carcinoma   . Bipolar disorder ([place])   . Cancer ([place])    MOS surgery basal cell face  . COPD (chronic obstructive pulmonary disease) ([place])   . Depression   . Eating disorder   . Eczema   . Encounter for blood transfusion   . Essential tremor   . 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 GERD (gastroesophageal reflux disease)   . Headache   . History of basal cell carcinoma   . Hypertension   . Neuropathy   . Parkinson's disease ([place])   . Parkinson's disease ([place])   . Parkinsonism ([place])      Objective: Physical Exam General: The patient is alert and oriented x3 in no acute distress.  Dermatology: Skin is warm, dry and supple bilateral lower extremities. Negative for open lesions or macerations bilateral.   Vascular: Dorsalis Pedis and Posterior Tibial pulses palpable bilateral.  Capillary fill time is immediate to all digits.  Neurological: Epicritic and protective threshold intact bilateral.   Musculoskeletal: Tenderness to palpation to the plantar aspect of the bilateral heels along the plantar fascia.  There is also some pain with palpation range of motion to the metatarsophalangeal joints of the bilateral feet.  High arches  noted bilateral all other joints range of motion within normal limits bilateral. Strength 5/5 in all groups bilateral.   Radiographic exam: Normal osseous mineralization. Joint spaces preserved. No fracture/dislocation/boney destruction. No other soft tissue abnormalities or radiopaque foreign bodies.  There is a small incidental finding of a bone spur formation to the talonavicular joint seen on lateral view of the right foot.  Assessment: 1. plantar fasciitis bilateral feet 2.  Metatarsalgia of both feet 3.  Cavus foot type bilateral  Plan of Care:  1. Patient evaluated. Xrays reviewed.   2.  OTC power step insoles were provided today.  Wear daily. 3.  Continue wearing Hoka, Solomon, and Altra shoes.  Patient states that she wears very good supportive sneakers daily and she changes them out. 4.  Plantar fascial braces were dispensed bilateral 5.  Return to clinic as needed  Edrick Kins, DPM [facility]  Dr. Edrick Kins, DPM    [address], [place] [address]                Office [phone]  Fax [phone]

## 2020-04-04 ENCOUNTER — Other Ambulatory Visit: Payer: Self-pay | Admitting: Podiatry

## 2020-04-04 DIAGNOSIS — M722 Plantar fascial fibromatosis: Secondary | ICD-10-CM

## 2020-06-25 ENCOUNTER — Telehealth: Payer: Self-pay | Admitting: Nurse Practitioner

## 2020-06-25 NOTE — Telephone Encounter (Signed)
Called patient to offer to schedule an in-home Palliative Consult, no answer - left message with reason for call along with my name and call back number. 

## 2020-06-29 ENCOUNTER — Telehealth: Payer: Self-pay | Admitting: Nurse Practitioner

## 2020-06-29 NOTE — Telephone Encounter (Signed)
Called patient's cell number to offer to schedule a Palliative Consult with her, no answer - left message with reason for call along with my name and call back number.

## 2020-07-02 ENCOUNTER — Telehealth: Payer: Self-pay | Admitting: Nurse Practitioner

## 2020-07-02 NOTE — Telephone Encounter (Signed)
Spoke with patient regarding the Palliative referral/services and she was in agreement with scheduling a visit with NP.  I have scheduled an In-home Consult for 07/17/20 @ 3 PM (pt requested visit after holidays).

## 2020-07-17 ENCOUNTER — Other Ambulatory Visit: Payer: Medicare Other | Admitting: Nurse Practitioner

## 2020-07-17 ENCOUNTER — Telehealth: Payer: Self-pay | Admitting: Nurse Practitioner

## 2020-07-17 ENCOUNTER — Other Ambulatory Visit: Payer: Self-pay

## 2020-07-17 NOTE — Telephone Encounter (Signed)
I attempted to contact Stacy Pearson concerning initial in person Palliative care visit to confirm appointment and covid screening. No answer, message left to contact prior to appointment to confirm. Call back not received, will need to attempt to reschedule appointment

## 2020-08-03 ENCOUNTER — Telehealth: Payer: Self-pay | Admitting: Nurse Practitioner

## 2020-08-03 NOTE — Telephone Encounter (Signed)
Called patient to offer to reschedule the Palliative Consult that was scheduled on 07/17/20 (No show), no answer - left message requesting a return call to reschedule visit, left my name and contact number.

## 2020-08-08 ENCOUNTER — Telehealth: Payer: Self-pay | Admitting: Nurse Practitioner

## 2020-08-08 NOTE — Telephone Encounter (Signed)
Called patient to offer to reschedule the Initial Palliative Consult that was scheduled previously for 07/17/20 (due to a No Show), no answer - left message requesting a return call to reschedule visit.  I then called the mother, Jetta Lout, and spoke with her to let her know that I was trying to reach the patient to schedule a visit and she said that patient doesn't always answer the phone if she doesn't know who it is.  Mother said they live together and I rescheduled the Consult with her for 08/16/20 @ 8:30 AM.

## 2020-08-09 ENCOUNTER — Telehealth: Payer: Self-pay | Admitting: Nurse Practitioner

## 2020-08-09 NOTE — Telephone Encounter (Signed)
The Palliative NP requested that I call and speak with the patient personally, to make sure that she was in agreement with her coming out and also to see if we could reschedule the Consult to 08/13/20 @ 9 AM. Called patient and left a message requesting that she call me back to confirm this instead of her Mom.  11:35 AM:  Rec'd call back from patient and she was in agreement with Palliative services and NP coming out to see her and she was also in agreement with rescheduling the visit to 08/13/20.  I also told her that the SW will also be accompanying the NP on the visit and she was okay with this.

## 2020-08-13 ENCOUNTER — Other Ambulatory Visit: Payer: Self-pay

## 2020-08-13 ENCOUNTER — Other Ambulatory Visit: Payer: Medicare Other | Admitting: Nurse Practitioner

## 2020-08-13 ENCOUNTER — Encounter: Payer: Self-pay | Admitting: Nurse Practitioner

## 2020-08-13 DIAGNOSIS — Z515 Encounter for palliative care: Secondary | ICD-10-CM

## 2020-08-13 DIAGNOSIS — G20 Parkinson's disease: Secondary | ICD-10-CM
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
## 2020-08-16 ENCOUNTER — Other Ambulatory Visit: Payer: Medicare Other | Admitting: Nurse Practitioner

## 2020-08-26 ENCOUNTER — Emergency Department: Payer: Medicare HMO

## 2020-08-26 ENCOUNTER — Inpatient Hospital Stay
Admission: EM | Admit: 2020-08-26 | Discharge: 2020-09-10 | DRG: 871 | Disposition: A | Discharge status: Home Health | Payer: Medicare HMO | Attending: Internal Medicine | Admitting: Internal Medicine

## 2020-08-26 ENCOUNTER — Other Ambulatory Visit: Payer: Self-pay

## 2020-08-26 ENCOUNTER — Encounter: Payer: Self-pay | Admitting: Internal Medicine

## 2020-08-26 ENCOUNTER — Inpatient Hospital Stay: Payer: Medicare HMO

## 2020-08-26 DIAGNOSIS — Z515 Encounter for palliative care: Secondary | ICD-10-CM | POA: Diagnosis not present

## 2020-08-26 DIAGNOSIS — J9601 Acute respiratory failure with hypoxia: Secondary | ICD-10-CM | POA: Diagnosis present

## 2020-08-26 DIAGNOSIS — J449 Chronic obstructive pulmonary disease, unspecified: Secondary | ICD-10-CM | POA: Diagnosis not present

## 2020-08-26 DIAGNOSIS — Z87891 Personal history of nicotine dependence: Secondary | ICD-10-CM

## 2020-08-26 DIAGNOSIS — G250 Essential tremor: Secondary | ICD-10-CM | POA: Diagnosis present

## 2020-08-26 DIAGNOSIS — N3000 Acute cystitis without hematuria: Secondary | ICD-10-CM

## 2020-08-26 DIAGNOSIS — E870 Hyperosmolality and hypernatremia: Secondary | ICD-10-CM | POA: Diagnosis present

## 2020-08-26 DIAGNOSIS — Z7951 Long term (current) use of inhaled steroids: Secondary | ICD-10-CM

## 2020-08-26 DIAGNOSIS — F319 Bipolar disorder, unspecified: Secondary | ICD-10-CM | POA: Diagnosis not present

## 2020-08-26 DIAGNOSIS — G40909 Epilepsy, unspecified, not intractable, without status epilepticus: Secondary | ICD-10-CM

## 2020-08-26 DIAGNOSIS — G20 Parkinson's disease: Secondary | ICD-10-CM | POA: Diagnosis present

## 2020-08-26 DIAGNOSIS — G43009 Migraine without aura, not intractable, without status migrainosus: Secondary | ICD-10-CM | POA: Diagnosis present

## 2020-08-26 DIAGNOSIS — R410 Disorientation, unspecified: Secondary | ICD-10-CM | POA: Diagnosis not present

## 2020-08-26 DIAGNOSIS — N390 Urinary tract infection, site not specified: Secondary | ICD-10-CM | POA: Diagnosis present

## 2020-08-26 DIAGNOSIS — K828 Other specified diseases of gallbladder: Secondary | ICD-10-CM | POA: Diagnosis present

## 2020-08-26 DIAGNOSIS — I120 Hypertensive chronic kidney disease with stage 5 chronic kidney disease or end stage renal disease: Secondary | ICD-10-CM | POA: Diagnosis present

## 2020-08-26 DIAGNOSIS — E875 Hyperkalemia: Secondary | ICD-10-CM | POA: Diagnosis present

## 2020-08-26 DIAGNOSIS — E872 Acidosis: Secondary | ICD-10-CM | POA: Diagnosis present

## 2020-08-26 DIAGNOSIS — Z85828 Personal history of other malignant neoplasm of skin: Secondary | ICD-10-CM

## 2020-08-26 DIAGNOSIS — I10 Essential (primary) hypertension: Secondary | ICD-10-CM | POA: Diagnosis present

## 2020-08-26 DIAGNOSIS — N2581 Secondary hyperparathyroidism of renal origin: Secondary | ICD-10-CM | POA: Diagnosis present

## 2020-08-26 DIAGNOSIS — M899 Disorder of bone, unspecified: Secondary | ICD-10-CM | POA: Diagnosis present

## 2020-08-26 DIAGNOSIS — R7989 Other specified abnormal findings of blood chemistry: Secondary | ICD-10-CM | POA: Diagnosis present

## 2020-08-26 DIAGNOSIS — C4490 Unspecified malignant neoplasm of skin, unspecified: Secondary | ICD-10-CM | POA: Diagnosis present

## 2020-08-26 DIAGNOSIS — G9341 Metabolic encephalopathy: Secondary | ICD-10-CM | POA: Diagnosis present

## 2020-08-26 DIAGNOSIS — L309 Dermatitis, unspecified: Secondary | ICD-10-CM | POA: Diagnosis present

## 2020-08-26 DIAGNOSIS — Z992 Dependence on renal dialysis: Secondary | ICD-10-CM | POA: Diagnosis not present

## 2020-08-26 DIAGNOSIS — D631 Anemia in chronic kidney disease: Secondary | ICD-10-CM | POA: Diagnosis present

## 2020-08-26 DIAGNOSIS — I700 Atherosclerosis of aorta: Secondary | ICD-10-CM | POA: Diagnosis present

## 2020-08-26 DIAGNOSIS — Z66 Do not resuscitate: Secondary | ICD-10-CM | POA: Diagnosis present

## 2020-08-26 DIAGNOSIS — G928 Other toxic encephalopathy: Secondary | ICD-10-CM | POA: Diagnosis present

## 2020-08-26 DIAGNOSIS — T424X5A Adverse effect of benzodiazepines, initial encounter: Secondary | ICD-10-CM | POA: Diagnosis not present

## 2020-08-26 DIAGNOSIS — T424X1A Poisoning by benzodiazepines, accidental (unintentional), initial encounter: Secondary | ICD-10-CM | POA: Diagnosis present

## 2020-08-26 DIAGNOSIS — G629 Polyneuropathy, unspecified: Secondary | ICD-10-CM | POA: Diagnosis present

## 2020-08-26 DIAGNOSIS — F418 Other specified anxiety disorders: Secondary | ICD-10-CM

## 2020-08-26 DIAGNOSIS — N179 Acute kidney failure, unspecified: Secondary | ICD-10-CM | POA: Diagnosis present

## 2020-08-26 DIAGNOSIS — D729 Disorder of white blood cells, unspecified: Secondary | ICD-10-CM | POA: Diagnosis not present

## 2020-08-26 DIAGNOSIS — T428X5A Adverse effect of antiparkinsonism drugs and other central muscle-tone depressants, initial encounter: Secondary | ICD-10-CM | POA: Diagnosis not present

## 2020-08-26 DIAGNOSIS — R6521 Severe sepsis with septic shock: Secondary | ICD-10-CM | POA: Diagnosis present

## 2020-08-26 DIAGNOSIS — G9340 Encephalopathy, unspecified: Secondary | ICD-10-CM | POA: Diagnosis not present

## 2020-08-26 DIAGNOSIS — N186 End stage renal disease: Secondary | ICD-10-CM | POA: Diagnosis present

## 2020-08-26 DIAGNOSIS — E8809 Other disorders of plasma-protein metabolism, not elsewhere classified: Secondary | ICD-10-CM | POA: Diagnosis present

## 2020-08-26 DIAGNOSIS — R509 Fever, unspecified: Secondary | ICD-10-CM | POA: Diagnosis not present

## 2020-08-26 DIAGNOSIS — R4182 Altered mental status, unspecified: Secondary | ICD-10-CM

## 2020-08-26 DIAGNOSIS — A419 Sepsis, unspecified organism: Secondary | ICD-10-CM | POA: Diagnosis present

## 2020-08-26 DIAGNOSIS — Z888 Allergy status to other drugs, medicaments and biological substances status: Secondary | ICD-10-CM

## 2020-08-26 DIAGNOSIS — Z7189 Other specified counseling: Secondary | ICD-10-CM | POA: Diagnosis not present

## 2020-08-26 DIAGNOSIS — G40409 Other generalized epilepsy and epileptic syndromes, not intractable, without status epilepticus: Secondary | ICD-10-CM | POA: Diagnosis present

## 2020-08-26 DIAGNOSIS — Z79891 Long term (current) use of opiate analgesic: Secondary | ICD-10-CM

## 2020-08-26 DIAGNOSIS — Z20822 Contact with and (suspected) exposure to covid-19: Secondary | ICD-10-CM | POA: Diagnosis present

## 2020-08-26 DIAGNOSIS — Z90710 Acquired absence of both cervix and uterus: Secondary | ICD-10-CM

## 2020-08-26 DIAGNOSIS — F061 Catatonic disorder due to known physiological condition: Secondary | ICD-10-CM | POA: Diagnosis present

## 2020-08-26 DIAGNOSIS — N185 Chronic kidney disease, stage 5: Secondary | ICD-10-CM | POA: Diagnosis not present

## 2020-08-26 DIAGNOSIS — Z885 Allergy status to narcotic agent status: Secondary | ICD-10-CM

## 2020-08-26 DIAGNOSIS — N170 Acute kidney failure with tubular necrosis: Secondary | ICD-10-CM | POA: Diagnosis present

## 2020-08-26 DIAGNOSIS — K219 Gastro-esophageal reflux disease without esophagitis: Secondary | ICD-10-CM | POA: Diagnosis present

## 2020-08-26 DIAGNOSIS — R0602 Shortness of breath: Secondary | ICD-10-CM

## 2020-08-26 DIAGNOSIS — F419 Anxiety disorder, unspecified: Secondary | ICD-10-CM | POA: Diagnosis present

## 2020-08-26 DIAGNOSIS — Z8659 Personal history of other mental and behavioral disorders: Secondary | ICD-10-CM

## 2020-08-26 DIAGNOSIS — Z79899 Other long term (current) drug therapy: Secondary | ICD-10-CM

## 2020-08-26 LAB — CBC WITH DIFFERENTIAL/PLATELET
Abs Immature Granulocytes: 0.13 10*3/uL — ABNORMAL HIGH (ref 0.00–0.07)
Basophils Absolute: 0 10*3/uL (ref 0.0–0.1)
Basophils Relative: 0 %
Eosinophils Absolute: 0 10*3/uL (ref 0.0–0.5)
Eosinophils Relative: 0 %
HCT: 46 % (ref 36.0–46.0)
Hemoglobin: 14.9 g/dL (ref 12.0–15.0)
Immature Granulocytes: 1 %
Lymphocytes Relative: 3 %
Lymphs Abs: 0.6 10*3/uL — ABNORMAL LOW (ref 0.7–4.0)
MCH: 30.7 pg (ref 26.0–34.0)
MCHC: 32.4 g/dL (ref 30.0–36.0)
MCV: 94.7 fL (ref 80.0–100.0)
Monocytes Absolute: 0.9 10*3/uL (ref 0.1–1.0)
Monocytes Relative: 5 %
Neutro Abs: 16.7 10*3/uL — ABNORMAL HIGH (ref 1.7–7.7)
Neutrophils Relative %: 91 %
Platelets: 475 10*3/uL — ABNORMAL HIGH (ref 150–400)
RBC: 4.86 MIL/uL (ref 3.87–5.11)
RDW: 14.9 % (ref 11.5–15.5)
WBC: 18.3 10*3/uL — ABNORMAL HIGH (ref 4.0–10.5)
nRBC: 0 % (ref 0.0–0.2)

## 2020-08-26 LAB — URINALYSIS, COMPLETE (UACMP) WITH MICROSCOPIC
Bilirubin Urine: NEGATIVE
Glucose, UA: 50 mg/dL — AB
Ketones, ur: NEGATIVE mg/dL
Leukocytes,Ua: NEGATIVE
Nitrite: NEGATIVE
Protein, ur: 100 mg/dL — AB
RBC / HPF: >50 RBC/hpf — ABNORMAL HIGH (ref 0–5)
Specific Gravity, Urine: 1.009 (ref 1.005–1.030)
pH: 5 (ref 5.0–8.0)

## 2020-08-26 LAB — TROPONIN I (HIGH SENSITIVITY)
Troponin I (High Sensitivity): 8 ng/L (ref <18)
Troponin I (High Sensitivity): 4 ng/L (ref <18)

## 2020-08-26 LAB — LACTIC ACID, PLASMA
Lactic Acid, Venous: 6.7 mmol/L (ref 0.5–1.9)
Lactic Acid, Venous: 5.3 mmol/L (ref 0.5–1.9)
Lactic Acid, Venous: 2.3 mmol/L (ref 0.5–1.9)
Lactic Acid, Venous: 2.8 mmol/L (ref 0.5–1.9)

## 2020-08-26 LAB — HIV ANTIBODY (ROUTINE TESTING W REFLEX): HIV Screen 4th Generation wRfx: NONREACTIVE

## 2020-08-26 LAB — COMPREHENSIVE METABOLIC PANEL
ALT: 20 U/L (ref 0–44)
AST: 46 U/L — ABNORMAL HIGH (ref 15–41)
Albumin: 4.5 g/dL (ref 3.5–5.0)
Alkaline Phosphatase: 112 U/L (ref 38–126)
Anion gap: 22 — ABNORMAL HIGH (ref 5–15)
BUN: 43 mg/dL — ABNORMAL HIGH (ref 6–20)
CO2: 12 mmol/L — ABNORMAL LOW (ref 22–32)
Calcium: 9.5 mg/dL (ref 8.9–10.3)
Chloride: 112 mmol/L — ABNORMAL HIGH (ref 98–111)
Creatinine, Ser: 3.53 mg/dL — ABNORMAL HIGH (ref 0.44–1.00)
GFR, Estimated: 14 mL/min — ABNORMAL LOW (ref >60)
Glucose, Bld: 148 mg/dL — ABNORMAL HIGH (ref 70–99)
Potassium: 4.8 mmol/L (ref 3.5–5.1)
Sodium: 146 mmol/L — ABNORMAL HIGH (ref 135–145)
Total Bilirubin: 0.5 mg/dL (ref 0.3–1.2)
Total Protein: 9 g/dL — ABNORMAL HIGH (ref 6.5–8.1)

## 2020-08-26 LAB — PROCALCITONIN: Procalcitonin: 1.77 ng/mL

## 2020-08-26 LAB — RESP PANEL BY RT-PCR (FLU A&B, COVID) ARPGX2
Influenza A by PCR: NEGATIVE
Influenza B by PCR: NEGATIVE
SARS Coronavirus 2 by RT PCR: NEGATIVE

## 2020-08-26 MED ORDER — PROPRANOLOL HCL ER 60 MG PO CP24
60.0000 mg | ORAL_CAPSULE | Freq: Every day | ORAL | Status: DC
  Administered 2020-08-26 – 2020-09-10 (×10): 60 mg via ORAL
  Filled 2020-08-26 (×16): qty 1

## 2020-08-26 MED ORDER — CARBIDOPA-LEVODOPA 25-100 MG PO TABS
1.0000 | ORAL_TABLET | Freq: Every day | ORAL | Status: DC
  Filled 2020-08-26: qty 1

## 2020-08-26 MED ORDER — IPRATROPIUM-ALBUTEROL 20-100 MCG/ACT IN AERS
1.0000 | INHALATION_SPRAY | Freq: Four times a day (QID) | RESPIRATORY_TRACT | Status: DC
  Administered 2020-09-01 – 2020-09-10 (×26): 1 via RESPIRATORY_TRACT
  Filled 2020-08-26 (×3): qty 4

## 2020-08-26 MED ORDER — VANCOMYCIN HCL 1500 MG/300ML IV SOLN
1500.0000 mg | Freq: Once | INTRAVENOUS | Status: AC
  Administered 2020-08-26: 1500 mg via INTRAVENOUS
  Filled 2020-08-26: qty 300

## 2020-08-26 MED ORDER — ACETAMINOPHEN 325 MG PO TABS
650.0000 mg | ORAL_TABLET | Freq: Four times a day (QID) | ORAL | Status: DC | PRN
  Administered 2020-09-09 – 2020-09-10 (×3): 650 mg via ORAL
  Filled 2020-08-26 (×3): qty 2

## 2020-08-26 MED ORDER — CARBIDOPA-LEVODOPA 25-100 MG PO TABS
1.0000 | ORAL_TABLET | Freq: Every day | ORAL | Status: DC
  Filled 2020-08-26 (×2): qty 1

## 2020-08-26 MED ORDER — ENOXAPARIN SODIUM 30 MG/0.3ML ~~LOC~~ SOLN
30.0000 mg | SUBCUTANEOUS | Status: DC
  Administered 2020-08-26 – 2020-08-27 (×2): 30 mg via SUBCUTANEOUS
  Filled 2020-08-26 (×3): qty 0.3

## 2020-08-26 MED ORDER — LORAZEPAM 2 MG/ML IJ SOLN: 1.0000 mg | Freq: Once | INTRAMUSCULAR | Status: DC

## 2020-08-26 MED ORDER — LORAZEPAM 2 MG/ML IJ SOLN
1.0000 mg | Freq: Four times a day (QID) | INTRAMUSCULAR | Status: DC
  Administered 2020-08-26 – 2020-08-28 (×7): 1 mg via INTRAVENOUS
  Filled 2020-08-26 (×7): qty 1

## 2020-08-26 MED ORDER — LAMOTRIGINE 25 MG PO TABS
25.0000 mg | ORAL_TABLET | Freq: Two times a day (BID) | ORAL | Status: DC
  Administered 2020-08-26 – 2020-09-10 (×21): 25 mg via ORAL
  Filled 2020-08-26 (×27): qty 1

## 2020-08-26 MED ORDER — LORAZEPAM 2 MG/ML IJ SOLN
0.5000 mg | INTRAMUSCULAR | Status: DC | PRN
  Filled 2020-08-26: qty 1

## 2020-08-26 MED ORDER — ACETAMINOPHEN 325 MG PO TABS: 650.0000 mg | ORAL_TABLET | Freq: Four times a day (QID) | ORAL | Status: DC | PRN

## 2020-08-26 MED ORDER — LAMOTRIGINE 25 MG PO TABS: 25.0000 mg | ORAL_TABLET | Freq: Two times a day (BID) | ORAL | Status: DC

## 2020-08-26 MED ORDER — CARBIDOPA-LEVODOPA 25-100 MG PO TABS
2.0000 | ORAL_TABLET | Freq: Two times a day (BID) | ORAL | Status: DC
  Administered 2020-08-26 – 2020-08-27 (×2): 2 via ORAL
  Filled 2020-08-26 (×4): qty 2

## 2020-08-26 MED ORDER — LORAZEPAM 2 MG/ML IJ SOLN: 2.0000 mg | INTRAMUSCULAR | Status: DC | PRN

## 2020-08-26 MED ORDER — SODIUM CHLORIDE 0.9 % IV SOLN
2.0000 g | Freq: Once | INTRAVENOUS | Status: AC
  Administered 2020-08-26: 2 g via INTRAVENOUS
  Filled 2020-08-26: qty 2

## 2020-08-26 MED ORDER — ENOXAPARIN SODIUM 40 MG/0.4ML ~~LOC~~ SOLN: 40.0000 mg | SUBCUTANEOUS | Status: DC

## 2020-08-26 MED ORDER — CARBIDOPA-LEVODOPA ER 25-100 MG PO TBCR: 2.0000 | EXTENDED_RELEASE_TABLET | Freq: Every day | ORAL | Status: DC

## 2020-08-26 MED ORDER — CARBIDOPA-LEVODOPA ER 25-100 MG PO TBCR: 1.0000 | EXTENDED_RELEASE_TABLET | Freq: Three times a day (TID) | ORAL | Status: DC

## 2020-08-26 MED ORDER — LACTATED RINGERS IV SOLN: INTRAVENOUS | Status: AC

## 2020-08-26 MED ORDER — VANCOMYCIN HCL IN DEXTROSE 1-5 GM/200ML-% IV SOLN: 1000.0000 mg | Freq: Once | INTRAVENOUS | Status: DC

## 2020-08-26 MED ORDER — LACTATED RINGERS IV BOLUS (SEPSIS)
1000.0000 mL | Freq: Once | INTRAVENOUS | Status: AC
  Administered 2020-08-26: 1000 mL via INTRAVENOUS

## 2020-08-26 MED ORDER — VANCOMYCIN HCL 750 MG/150ML IV SOLN
750.0000 mg | INTRAVENOUS | Status: DC
  Filled 2020-08-26: qty 150

## 2020-08-26 MED ORDER — CARBIDOPA-LEVODOPA ER 25-100 MG PO TBCR: 1.0000 | EXTENDED_RELEASE_TABLET | Freq: Four times a day (QID) | ORAL | Status: DC

## 2020-08-26 MED ORDER — METRONIDAZOLE IN NACL 5-0.79 MG/ML-% IV SOLN
500.0000 mg | Freq: Once | INTRAVENOUS | Status: AC
  Administered 2020-08-26: 500 mg via INTRAVENOUS
  Filled 2020-08-26: qty 100

## 2020-08-26 MED ORDER — ACETAMINOPHEN 650 MG RE SUPP
650.0000 mg | Freq: Four times a day (QID) | RECTAL | Status: DC | PRN
  Administered 2020-09-03 – 2020-09-04 (×2): 650 mg via RECTAL
  Filled 2020-08-26 (×2): qty 1

## 2020-08-26 MED ORDER — SODIUM CHLORIDE 0.9 % IV SOLN
2.0000 g | INTRAVENOUS | Status: DC
  Administered 2020-08-27: 2 g via INTRAVENOUS
  Filled 2020-08-26: qty 2

## 2020-08-26 MED ORDER — CARBIDOPA-LEVODOPA 25-100 MG PO TABS
1.0000 | ORAL_TABLET | Freq: Three times a day (TID) | ORAL | Status: DC
  Filled 2020-08-26 (×2): qty 1

## 2020-08-26 MED ORDER — DIAZEPAM 1 MG/ML PO SOLN: 5.0000 mg | Freq: Three times a day (TID) | ORAL | Status: DC

## 2020-08-26 MED ORDER — HALOPERIDOL LACTATE 5 MG/ML IJ SOLN
5.0000 mg | Freq: Once | INTRAMUSCULAR | Status: AC
  Administered 2020-08-26: 5 mg via INTRAVENOUS
  Filled 2020-08-26: qty 1

## 2020-08-26 MED ORDER — ONDANSETRON HCL 4 MG/2ML IJ SOLN
4.0000 mg | Freq: Three times a day (TID) | INTRAMUSCULAR | Status: DC | PRN
  Administered 2020-09-06 – 2020-09-08 (×2): 4 mg via INTRAVENOUS
  Filled 2020-08-26 (×2): qty 2

## 2020-08-26 MED ORDER — ALBUTEROL SULFATE (2.5 MG/3ML) 0.083% IN NEBU
2.5000 mg | INHALATION_SOLUTION | RESPIRATORY_TRACT | Status: DC | PRN
  Administered 2020-09-02: 2.5 mg via RESPIRATORY_TRACT
  Filled 2020-08-26: qty 3

## 2020-08-26 MED ORDER — CARBIDOPA-LEVODOPA ER 25-100 MG PO TBCR
1.0000 | EXTENDED_RELEASE_TABLET | Freq: Four times a day (QID) | ORAL | Status: DC
  Administered 2020-08-26 (×2): 1 via ORAL
  Filled 2020-08-26 (×7): qty 1

## 2020-08-26 NOTE — ED Notes (Signed)
Attempted second line, unsuccessfully.  Pt did not tolerate well.  Second liter of LR infusing.  Will hang abx pending Dr Blaine Hamper recommendation, as we are waiting for second set of cultures.  Pt becomes very agitated with mild stimulation.

## 2020-08-26 NOTE — ED Notes (Signed)
Critical received for RN Tammy, Lactic 5.3, will alert admitting MD

## 2020-08-26 NOTE — ED Notes (Signed)
Pt restless, trying to squirm out of bed.  Sitter remains at bedside.  Messaged provider regarding giving something to help calm.  Awaiting further orders.

## 2020-08-26 NOTE — Progress Notes (Signed)
PHARMACY -  BRIEF ANTIBIOTIC NOTE   Pharmacy has received consult(s) for Cefepime and Vancomycin from an ED provider.  The patient's profile has been reviewed for ht/wt/allergies/indication/available labs.    One time order(s) placed for Cefepime 2 gm and Vancomycin 1500mg  (Pt wt 61.6 kg).  Further antibiotics/pharmacy consults should be ordered by admitting physician if indicated.                       Renda Rolls, PharmD, [facility] 08/26/2020 6:04 AM

## 2020-08-26 NOTE — ED Triage Notes (Signed)
Pt arrives EMS for c/o altered mental status. Pt was found unresponsive by her mother and was difficult to awake. Last known well was Friday 2/11 at 1400. Pts mother states that she felt like the pt was short of breath. Pt took her bipolar meds and slept for the past few days which is her normal. Pt is restless and cussing during triage.

## 2020-08-26 NOTE — Progress Notes (Signed)
Pharmacy Antibiotic Note  Stacy Pearson is a 60 y.o. female admitted on 08/26/2020 with sepsis with unclear origin of infection.  Pharmacy has been consulted for vancomycin and cefepime dosing. Her renal function is noted to be significantly impaired compared to her last baseline level. She is scheduled to receive 1500 mg IV vancomycin as a loading dose in the ED  Plan:  1) start cefepime 2 grams IV every 24 hours  2) start vancomycin 750 mg IV every 48 hours  Ke: 0.018 h-1, T1/2: 38h  Css (calculated): 29.1/12.3 mcg/mL  Daily SCr while on vancomycin to assess renal function  Vancomycin levels as clinically indicated  Height: 5\' 7"  (170.2 cm) Weight: 61.6 kg (135 lb 12.9 oz) IBW/kg (Calculated) : 61.6  Temp (24hrs), Avg:98.5 F (36.9 C), Min:98.3 F (36.8 C), Max:98.7 F (37.1 C)  Recent Labs  Lab 08/26/20 0459 08/26/20 0508 08/26/20 0640  WBC  --  18.3*  --   CREATININE  --  3.53*  --   LATICACIDVEN 6.7*  --  5.3*    Estimated Creatinine Clearance: 16.7 mL/min (A) (by C-G formula based on SCr of 3.53 mg/dL (H)).    Allergies  Allergen Reactions  . Codeine Nausea And Vomiting  . Prednisone     Antimicrobials this admission: vancomycin 2/13 >>  cefepime 2/13 >>   Microbiology results: 2/13 BCx: pending 2/13 UCx: pending  2/13 SARS CoV-2: negative 2/13 influenza A/B: negaive  Thank you for allowing pharmacy to be a part of this patient's care.  Dallie Piles 08/26/2020 8:01 AM

## 2020-08-26 NOTE — ED Notes (Signed)
Sitting with pt 1:1

## 2020-08-26 NOTE — Consult Note (Signed)
[facility] Face-to-Face Psychiatry Consult   Reason for Consult: Consult for 60 year old woman brought to the hospital with altered mental status Referring Physician:  Blaine Hamper Patient Identification: Stacy Pearson MRN:  [id number] Principal Diagnosis: Delirium Diagnosis:  Principal Problem:   Delirium Active Problems:   Bipolar 1 disorder ([place])   COPD (chronic obstructive pulmonary disease) ([place])   Essential tremor   Migraine without aura   Parkinson's disease ([place])   Epilepsy ([place])   Depression with anxiety   HTN (hypertension)   Acute metabolic encephalopathy   UTI (urinary tract infection)   AKI (acute kidney injury) ([place])   Severe sepsis with septic shock ([place])   Total Time spent with patient: 1 hour  Subjective:   Stacy Pearson is a 60 y.o. female patient admitted with patient is not able to communicate.  HPI: Patient seen chart reviewed.  60 year old woman brought to the emergency room after family found her with altered mental status.  It sounds like her mother last saw her a couple days ago at which time the patient appeared to be in her normal condition.  Was found today confused and disorganized.  On interview today the patient was not able to give any information.  She responded very minimally to saying her name loudly but did not speak.  At 1 point I moved her hands slightly and she reacted with pain and said "I will" but then would not say anything else after that.  Labs so far not revealing of a specific abnormality.  Patient has a history of a diagnosis of bipolar disorder as well as other psychiatric concerns and is on Valium 10 mg 3 times a day along with other medications.  On examination today the patient is confused and not communicating but is not stiff at all it was possible to move all of her limbs without difficulty.  Past Psychiatric History: History of bipolar disorder diagnosis.  Also sounds like a more recent history of Parkinson's disease.  Risk to Self:    Risk to Others:   Prior Inpatient Therapy:   Prior Outpatient Therapy:    Past Medical History:  Past Medical History:  Diagnosis Date  . Abdominal pain   . Allergic genetic state   . Anxiety   . Asthma   . Basal cell carcinoma   . Bipolar disorder ([place])   . Cancer ([place])    MOS surgery basal cell face  . COPD (chronic obstructive pulmonary disease) ([place])   . Depression   . Eating disorder   . Eczema   . Encounter for blood transfusion   . Essential tremor   . 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 GERD (gastroesophageal reflux disease)   . Headache   . History of basal cell carcinoma   . Hypertension   . Neuropathy   . Parkinson's disease ([place])   . Parkinson's disease ([place])   . Parkinsonism [facility])     Past Surgical History:  Procedure Laterality Date  . ABDOMINAL HYSTERECTOMY    . ADENOIDECTOMY    . BLADDER SURGERY    . bone grafts    . CATARACT EXTRACTION W/ INTRAOCULAR LENS IMPLANT    . CERVICAL FUSION    . COLONOSCOPY    . COLONOSCOPY WITH ESOPHAGOGASTRODUODENOSCOPY (EGD)    . COLONOSCOPY WITH PROPOFOL N/A 04/28/2018   Procedure: COLONOSCOPY WITH PROPOFOL;  Surgeon: Manya Silvas, MD;  Location: [facility] ENDOSCOPY;  Service: Endoscopy;  Laterality: N/A;  . EYE SURGERY    .  GRAFT APPLICATION     in neck  . LAPAROSCOPIC BILATERAL SALPINGO OOPHERECTOMY    . NEUROPLASTY / TRANSPOSITION ULNAR NERVE AT ELBOW    . OOPHORECTOMY    . SKIN CANCER EXCISION    . skin grafts   left forearm; hand bil.lower extremeties  . titanium plate    . TONSILLECTOMY    . TUBAL LIGATION     Family History:  Family History  Problem Relation Age of Onset  . Breast cancer Mother   . Lung cancer Father    Family Psychiatric  History: None known at this time Social History:  Social History   Substance and Sexual Activity  Alcohol Use Never     Social History   Substance and Sexual Activity  Drug Use Never    Social History   Socioeconomic  History  . Marital status: Single    Spouse name: Not on file  . Number of children: Not on file  . Years of education: Not on file  . Highest education level: Not on file  Occupational History  . Not on file  Tobacco Use  . Smoking status: Former Smoker    Types: Cigarettes    Quit date: 07/14/2006    Years since quitting: 14.1  . Smokeless tobacco: Never Used  Substance and Sexual Activity  . Alcohol use: Never  . Drug use: Never  . Sexual activity: Not on file  Other Topics Concern  . Not on file  Social History Narrative  . Not on file   Social Determinants of Health   Financial Resource Strain: Not on file  Food Insecurity: Not on file  Transportation Needs: Not on file  Physical Activity: Not on file  Stress: Not on file  Social Connections: Not on file   Additional Social History:    Allergies:   Allergies  Allergen Reactions  . Codeine Nausea And Vomiting  . Prednisone     Labs:  Results for orders placed or performed during the hospital encounter of 08/26/20 (from the past 48 hour(s))  Lactic acid, plasma     Status: Abnormal   Collection Time: 08/26/20  4:59 AM  Result Value Ref Range   Lactic Acid, Venous 6.7 (HH) 0.5 - 1.9 mmol/L    Comment: CRITICAL RESULT CALLED TO, READ BACK BY AND VERIFIED WITH MEGAN CHANEY AT 4098 08/26/20 MF. Performed at [facility], [address] [place], [place] [address]   CBC with Differential     Status: Abnormal   Collection Time: 08/26/20  5:08 AM  Result Value Ref Range   WBC 18.3 (H) 4.0 - 10.5 K/uL   RBC 4.86 3.87 - 5.11 MIL/uL   Hemoglobin 14.9 12.0 - 15.0 g/dL   HCT 46.0 36.0 - 46.0 %   MCV 94.7 80.0 - 100.0 fL   MCH 30.7 26.0 - 34.0 pg   MCHC 32.4 30.0 - 36.0 g/dL   RDW 14.9 11.5 - 15.5 %   Platelets 475 (H) 150 - 400 K/uL   nRBC 0.0 0.0 - 0.2 %   Neutrophils Relative % 91 %   Neutro Abs 16.7 (H) 1.7 - 7.7 K/uL   Lymphocytes Relative 3 %   Lymphs Abs 0.6 (L) 0.7 - 4.0 K/uL   Monocytes  Relative 5 %   Monocytes Absolute 0.9 0.1 - 1.0 K/uL   Eosinophils Relative 0 %   Eosinophils Absolute 0.0 0.0 - 0.5 K/uL   Basophils Relative 0 %   Basophils Absolute 0.0 0.0 -  0.1 K/uL   Immature Granulocytes 1 %   Abs Immature Granulocytes 0.13 (H) 0.00 - 0.07 K/uL    Comment: Performed at [facility], [address] [place], [place] [address]  Comprehensive metabolic panel     Status: Abnormal   Collection Time: 08/26/20  5:08 AM  Result Value Ref Range   Sodium 146 (H) 135 - 145 mmol/L    Comment: LYTES REPEATED DAS   Potassium 4.8 3.5 - 5.1 mmol/L   Chloride 112 (H) 98 - 111 mmol/L   CO2 12 (L) 22 - 32 mmol/L   Glucose, Bld 148 (H) 70 - 99 mg/dL    Comment: Glucose reference range applies only to samples taken after fasting for at least 8 hours.   BUN 43 (H) 6 - 20 mg/dL   Creatinine, Ser 3.53 (H) 0.44 - 1.00 mg/dL   Calcium 9.5 8.9 - 10.3 mg/dL   Total Protein 9.0 (H) 6.5 - 8.1 g/dL   Albumin 4.5 3.5 - 5.0 g/dL   AST 46 (H) 15 - 41 U/L   ALT 20 0 - 44 U/L   Alkaline Phosphatase 112 38 - 126 U/L   Total Bilirubin 0.5 0.3 - 1.2 mg/dL   GFR, Estimated 14 (L) >60 mL/min    Comment: (NOTE) Calculated using the CKD-EPI Creatinine Equation (2021)    Anion gap 22 (H) 5 - 15    Comment: Performed at [facility], [place], [place] [address]  Troponin I (High Sensitivity)     Status: None   Collection Time: 08/26/20  5:08 AM  Result Value Ref Range   Troponin I (High Sensitivity) 8 <18 ng/L    Comment: (NOTE) Elevated high sensitivity troponin I (hsTnI) values and significant  changes across serial measurements may suggest ACS but many other  chronic and acute conditions are known to elevate hsTnI results.  Refer to the "Links" section for chest pain algorithms and additional  guidance. Performed at [facility], [address] [place], [place] [address]   Urinalysis, Complete w Microscopic Urine, Clean Catch     Status:  Abnormal   Collection Time: 08/26/20  5:08 AM  Result Value Ref Range   Color, Urine YELLOW (A) YELLOW   APPearance CLOUDY (A) CLEAR   Specific Gravity, Urine 1.009 1.005 - 1.030   pH 5.0 5.0 - 8.0   Glucose, UA 50 (A) NEGATIVE mg/dL   Hgb urine dipstick LARGE (A) NEGATIVE   Bilirubin Urine NEGATIVE NEGATIVE   Ketones, ur NEGATIVE NEGATIVE mg/dL   Protein, ur 100 (A) NEGATIVE mg/dL   Nitrite NEGATIVE NEGATIVE   Leukocytes,Ua NEGATIVE NEGATIVE   RBC / HPF >50 (H) 0 - 5 RBC/hpf   WBC, UA 11-20 0 - 5 WBC/hpf   Bacteria, UA RARE (A) NONE SEEN   Squamous Epithelial / LPF 0-5 0 - 5   WBC Clumps PRESENT     Comment: Performed at [facility], [address], [place], [place] [address]  Resp Panel by RT-PCR (Flu A&B, Covid) Urine, Clean Catch     Status: None   Collection Time: 08/26/20  5:08 AM   Specimen: Urine, Clean Catch; Nasopharyngeal(NP) swabs in vial transport medium  Result Value Ref Range   SARS Coronavirus 2 by RT PCR NEGATIVE NEGATIVE    Comment: (NOTE) SARS-CoV-2 target nucleic acids are NOT DETECTED.  The SARS-CoV-2 RNA is generally detectable in upper respiratory specimens during the acute phase of infection. The lowest concentration of  SARS-CoV-2 viral copies this assay can detect is 138 copies/mL. A negative result does not preclude SARS-Cov-2 infection and should not be used as the sole basis for treatment or other patient management decisions. A negative result may occur with  improper specimen collection/handling, submission of specimen other than nasopharyngeal swab, presence of viral mutation(s) within the areas targeted by this assay, and inadequate number of viral copies(<138 copies/mL). A negative result must be combined with clinical observations, patient history, and epidemiological information. The expected result is Negative.  Fact Sheet for Patients:  EntrepreneurPulse.com.au  Fact Sheet for Healthcare Providers:   IncredibleEmployment.be  This test is no t yet approved or cleared by the [place] FDA and  has been authorized for detection and/or diagnosis of SARS-CoV-2 by FDA under an Emergency Use Authorization (EUA). This EUA will remain  in effect (meaning this test can be used) for the duration of the COVID-19 declaration under Section 564(b)(1) of the Act, 21 U.S.C.section 360bbb-3(b)(1), unless the authorization is terminated  or revoked sooner.       Influenza A by PCR NEGATIVE NEGATIVE   Influenza B by PCR NEGATIVE NEGATIVE    Comment: (NOTE) The Xpert Xpress SARS-CoV-2/FLU/RSV plus assay is intended as an aid in the diagnosis of influenza from Nasopharyngeal swab specimens and should not be used as a sole basis for treatment. Nasal washings and aspirates are unacceptable for Xpert Xpress SARS-CoV-2/FLU/RSV testing.  Fact Sheet for Patients: EntrepreneurPulse.com.au  Fact Sheet for Healthcare Providers: IncredibleEmployment.be  This test is not yet approved or cleared by the [place] FDA and has been authorized for detection and/or diagnosis of SARS-CoV-2 by FDA under an Emergency Use Authorization (EUA). This EUA will remain in effect (meaning this test can be used) for the duration of the COVID-19 declaration under Section 564(b)(1) of the Act, 21 U.S.C. section 360bbb-3(b)(1), unless the authorization is terminated or revoked.  Performed at [facility], [place], [place] [address]   Procalcitonin - Baseline     Status: None   Collection Time: 08/26/20  5:08 AM  Result Value Ref Range   Procalcitonin 1.77 ng/mL    Comment:        Interpretation: PCT > 0.5 ng/mL and <= 2 ng/mL: Systemic infection (sepsis) is possible, but other conditions are known to elevate PCT as well. (NOTE)       Sepsis PCT Algorithm           Lower Respiratory Tract                                       Infection PCT Algorithm    ----------------------------     ----------------------------         PCT < 0.25 ng/mL                PCT < 0.10 ng/mL          Strongly encourage             Strongly discourage   discontinuation of antibiotics    initiation of antibiotics    ----------------------------     -----------------------------       PCT 0.25 - 0.50 ng/mL            PCT 0.10 - 0.25 ng/mL               OR       >80% decrease  in PCT            Discourage initiation of                                            antibiotics      Encourage discontinuation           of antibiotics    ----------------------------     -----------------------------         PCT >= 0.50 ng/mL              PCT 0.26 - 0.50 ng/mL                AND       <80% decrease in PCT             Encourage initiation of                                             antibiotics       Encourage continuation           of antibiotics    ----------------------------     -----------------------------        PCT >= 0.50 ng/mL                  PCT > 0.50 ng/mL               AND         increase in PCT                  Strongly encourage                                      initiation of antibiotics    Strongly encourage escalation           of antibiotics                                     -----------------------------                                           PCT <= 0.25 ng/mL                                                 OR                                        > 80% decrease in PCT                                      Discontinue / Do not initiate  antibiotics  Performed at [facility], [place], [place] [address]   Lactic acid, plasma     Status: Abnormal   Collection Time: 08/26/20  6:40 AM  Result Value Ref Range   Lactic Acid, Venous 5.3 (HH) 0.5 - 1.9 mmol/L    Comment: CRITICAL RESULT CALLED TO, READ BACK BY AND VERIFIED WITH LAURA  STANFIELD AT 7989 08/26/20 DAS Performed at [facility], [place], [place] [address]   Troponin I (High Sensitivity)     Status: None   Collection Time: 08/26/20  6:40 AM  Result Value Ref Range   Troponin I (High Sensitivity) 4 <18 ng/L    Comment: (NOTE) Elevated high sensitivity troponin I (hsTnI) values and significant  changes across serial measurements may suggest ACS but many other  chronic and acute conditions are known to elevate hsTnI results.  Refer to the "Links" section for chest pain algorithms and additional  guidance. Performed at [facility], [address] [place], [place] [address]   Lactic acid, plasma     Status: Abnormal   Collection Time: 08/26/20 10:30 AM  Result Value Ref Range   Lactic Acid, Venous 2.3 (HH) 0.5 - 1.9 mmol/L    Comment: CRITICAL VALUE NOTED. VALUE IS CONSISTENT WITH PREVIOUSLY REPORTED/CALLED VALUE DAS Performed at [facility], [address] [place], [place] [address]     Current Facility-Administered Medications  Medication Dose Route Frequency Provider Last Rate Last Admin  . acetaminophen (TYLENOL) tablet 650 mg  650 mg Oral Q6H PRN Ivor Costa, MD       Or  . acetaminophen (TYLENOL) suppository 650 mg  650 mg Rectal Q6H PRN Ivor Costa, MD      . albuterol (PROVENTIL) (2.5 MG/3ML) 0.083% nebulizer solution 2.5 mg  2.5 mg Nebulization Q4H PRN Ivor Costa, MD      . carbidopa-levodopa (SINEMET IR) 25-100 MG per tablet immediate release 1 tablet  1 tablet Oral Daily Ivor Costa, MD      . carbidopa-levodopa (SINEMET IR) 25-100 MG per tablet immediate release 2 tablet  2 tablet Oral BID Ivor Costa, MD   2 tablet at 08/26/20 1243  . Carbidopa-Levodopa ER (SINEMET CR) 25-100 MG tablet controlled release 1 tablet  1 tablet Oral QID Ivor Costa, MD   1 tablet at 08/26/20 1243  . [START ON 08/27/2020] ceFEPIme (MAXIPIME) 2 g in sodium chloride 0.9 % 100 mL IVPB  2 g Intravenous Q24H Dallie Piles, RPH       . enoxaparin (LOVENOX) injection 30 mg  30 mg Subcutaneous Q24H Dallie Piles, RPH      . Ipratropium-Albuterol (COMBIVENT) respimat 1 puff  1 puff Inhalation Q6H Ivor Costa, MD      . lactated ringers infusion   Intravenous Continuous Ivor Costa, MD 100 mL/hr at 08/26/20 0912 New Bag at 08/26/20 0912  . lamoTRIgine (LAMICTAL) tablet 25 mg  25 mg Oral BID Ivor Costa, MD   25 mg at 08/26/20 1110  . LORazepam (ATIVAN) injection 0.5 mg  0.5 mg Intravenous Q4H PRN Ivor Costa, MD      . LORazepam (ATIVAN) injection 1 mg  1 mg Intravenous Once Ivor Costa, MD      . LORazepam (ATIVAN) injection 1 mg  1 mg Intravenous Q6H Maripat Borba T, MD      . LORazepam (ATIVAN) injection 2 mg  2 mg Intravenous Q2H PRN Ivor Costa, MD      . ondansetron (  ZOFRAN) injection 4 mg  4 mg Intravenous Q8H PRN Ivor Costa, MD      . propranolol ER (INDERAL LA) 24 hr capsule 60 mg  60 mg Oral Daily Ivor Costa, MD   60 mg at 08/26/20 1242  . [START ON 08/27/2020] vancomycin (VANCOREADY) IVPB 750 mg/150 mL  750 mg Intravenous Q48H Dallie Piles, [facility]       Current Outpatient Medications  Medication Sig Dispense Refill  . Carbidopa-Levodopa ER (SINEMET CR) 25-100 MG tablet controlled release Take 1-2 tablets by mouth 4 (four) times daily. Take one tablet by mouth at 7 am, one tablet at 12 pm, one tablet between 5-6 pm and two tablets at bedtime    . amitriptyline (ELAVIL) 50 MG tablet Take 1-2 tablets by mouth at bedtime.    . carbidopa-levodopa (SINEMET IR) 25-100 MG tablet Take 1-2 tablets by mouth 3 (three) times daily. Take two tablets by mouth at 7 am, two tablets at 12 pm and one tablet at 5 pm    . Cariprazine HCl 6 MG CAPS Take 6 mg by mouth daily at 12 noon.    . diazepam (VALIUM) 10 MG tablet Take 10 mg by mouth 3 (three) times daily as needed.    . DULoxetine (CYMBALTA) 60 MG capsule Take by mouth.    . EPINEPHrine 0.3 mg/0.3 mL IJ SOAJ injection Inject into the muscle once.    . Ipratropium-Albuterol  (COMBIVENT) 20-100 MCG/ACT AERS respimat Inhale 1 puff into the lungs every 6 (six) hours.    Marland Kitchen lamoTRIgine (LAMICTAL) 25 MG tablet Take 50 mg by mouth at bedtime.    . ondansetron (ZOFRAN) 8 MG tablet Take by mouth every 8 (eight) hours as needed for nausea or vomiting.    . propranolol ER (INDERAL LA) 60 MG 24 hr capsule Take 60 mg by mouth daily.      Musculoskeletal: Strength & Muscle Tone: decreased Gait & Station: unable to stand Patient leans: N/A  Psychiatric Specialty Exam: Physical Exam Vitals and nursing note reviewed.  Constitutional:      Appearance: She is well-developed and well-nourished.  HENT:     Head: Normocephalic and atraumatic.  Eyes:     Conjunctiva/sclera: Conjunctivae normal.     Pupils: Pupils are equal, round, and reactive to light.  Cardiovascular:     Heart sounds: Normal heart sounds.  Pulmonary:     Effort: Pulmonary effort is normal.  Abdominal:     Palpations: Abdomen is soft.  Musculoskeletal:        General: Normal range of motion.     Cervical back: Normal range of motion.  Skin:    General: Skin is warm and dry.  Neurological:     General: No focal deficit present.     Mental Status: She is alert.  Psychiatric:        Attention and Perception: She is inattentive.        Mood and Affect: Affect is blunt.        Speech: She is noncommunicative.     Review of Systems  Unable to perform ROS: Mental status change    Blood pressure 134/65, pulse (!) 108, temperature 98.7 F (37.1 C), temperature source Rectal, resp. rate (!) 23, height 5\' 7"  (1.702 m), weight 61.6 kg, SpO2 99 %.Body mass index is 21.27 kg/m.  General Appearance: Disheveled  Eye Contact:  None  Speech:  Negative  Volume:  Decreased  Mood:  Negative  Affect:  Negative  Thought Process:  Disorganized  Orientation:  Negative  Thought Content:  Negative  Suicidal Thoughts:  No  Homicidal Thoughts:  No  Memory:  Negative  Judgement:  Negative  Insight:  Negative   Psychomotor Activity:  Negative  Concentration:  Concentration: Negative  Recall:  Negative  Fund of Knowledge:  Negative  Language:  Negative  Akathisia:  Negative  Handed:  Right  AIMS (if indicated):     Assets:  Housing  ADL's:  Impaired  Cognition:  Impaired,  Moderate  Sleep:        Treatment Plan Summary: Medication management and Plan Patient is currently being worked up and managed with empiric coverage for possible sepsis.  Sitter is present.  She was not agitated at this time and I would not recommend adding any new psychiatric medicine.  Orders appear to be in place for her lamotrigine which apparently she was taking before she came in.  I saw that she got her diazepam filled about 5 days ago on the usual schedule 10 mg 3 times a day.  This is a high enough dose that even if she had not overused it the withdrawal could be problematic.  Therefore I have put in orders for Ativan 1 mg every 6 hours IV hoping that this will at least prevent seizures.  Patient is not currently able to take p.o.  I will continue to follow and will certainly see her tomorrow and see if we can get any more information about what might be going on.  Disposition: No evidence of imminent risk to self or others at present.   Patient does not meet criteria for psychiatric inpatient admission. Supportive therapy provided about ongoing stressors.  Alethia Berthold, MD 08/26/2020 2:39 PM

## 2020-08-26 NOTE — ED Notes (Addendum)
Mother, Marquis Buggy, called, notified of inability to give verbal permission to release information att, pt room assignment, and of visiting hours - family reports will visit after a doctor appt (post 11am) tomorrow

## 2020-08-26 NOTE — Progress Notes (Signed)
Elink following for Sepsis Protocol 

## 2020-08-26 NOTE — H&P (Addendum)
History and Physical    Stacy Pearson GDJ:[id number] DOB: 1961/04/12 DOA: 08/26/2020  Referring MD/NP/PA:   PCP: Valera Castle, MD   Patient coming from:  The patient is coming from home.  At baseline, pt is independent for most of ADL.        Chief Complaint: AMS  HPI: Stacy Pearson is a 60 y.o. female with medical history significant of bipolar, depression, anxiety, Parkinson, epilepsy, HTN, COPD, presents with AMS.  Patient is confused and agitated. She is unable to provide accurate medical history. Per her mother (I called her mother by phone), patient was last seen in her normal state of health Friday afternoon. She has been sleeping since then. Pt was found to be very confused and difficult to be aroused. At arrival to ED, pt woke up, confused, agitated, being restless and cursing.  She moves all extremities normally.  No facial droop or slurred speech.  Per her mother, patient seems to have some shortness breath, but no active cough, respiratory distress, nausea, vomiting, diarrhea noted.  No fever or chills.   ED Course: pt was found to have WBC 18.3, lactic acid 6.7, 5.3, troponin level 4 -->4, urinalysis (cloudy appearance, negative leukocyte, rare bacteria, WBC 11-20), Covid PCR negative, AKI with creatinine 3.53, BUN 43 (creatinine 0.8 on 01/31/2020), temperature normal, blood pressure 131/68, tachycardia with heart rate 105, RR 33, oxygen saturation 92-100% on room air.  Chest x-ray showed low lung volumes without infiltration.  CT of the head is negative for acute intracranial abnormalities.  Patient is admitted to progressive bed as inpatient.   Review of Systems: Could not be reviewed due to altered mental status   Allergy:  Allergies  Allergen Reactions  . Codeine Nausea And Vomiting  . Prednisone     Past Medical History:  Diagnosis Date  . Abdominal pain   . Allergic genetic state   . Anxiety   . Asthma   . Basal cell carcinoma   . Bipolar  disorder ([place])   . Cancer ([place])    MOS surgery basal cell face  . COPD (chronic obstructive pulmonary disease) ([place])   . Depression   . Eating disorder   . Eczema   . Encounter for blood transfusion   . Essential tremor   . 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 GERD (gastroesophageal reflux disease)   . Headache   . History of basal cell carcinoma   . Hypertension   . Neuropathy   . Parkinson's disease ([place])   . Parkinson's disease ([place])   . Parkinsonism [facility])     Past Surgical History:  Procedure Laterality Date  . ABDOMINAL HYSTERECTOMY    . ADENOIDECTOMY    . BLADDER SURGERY    . bone grafts    . CATARACT EXTRACTION W/ INTRAOCULAR LENS IMPLANT    . CERVICAL FUSION    . COLONOSCOPY    . COLONOSCOPY WITH ESOPHAGOGASTRODUODENOSCOPY (EGD)    . COLONOSCOPY WITH PROPOFOL N/A 04/28/2018   Procedure: COLONOSCOPY WITH PROPOFOL;  Surgeon: Manya Silvas, MD;  Location: [facility] ENDOSCOPY;  Service: Endoscopy;  Laterality: N/A;  . EYE SURGERY    . GRAFT APPLICATION     in neck  . LAPAROSCOPIC BILATERAL SALPINGO OOPHERECTOMY    . NEUROPLASTY / TRANSPOSITION ULNAR NERVE AT ELBOW    . OOPHORECTOMY    . SKIN CANCER EXCISION    . skin grafts   left forearm; hand bil.lower extremeties  . titanium  plate    . TONSILLECTOMY    . TUBAL LIGATION      Social History:  reports that she quit smoking about 14 years ago. Her smoking use included cigarettes. She has never used smokeless tobacco. She reports that she does not drink alcohol and does not use drugs.  Family History:  Family History  Problem Relation Age of Onset  . Breast cancer Mother   . Lung cancer Father      Prior to Admission medications   Medication Sig Start Date End Date Taking? Authorizing Provider  albuterol (PROVENTIL) (2.5 MG/3ML) 0.083% nebulizer solution Inhale into the lungs. 12/14/18   [provider]  amitriptyline (ELAVIL) 50 MG tablet Take 1-2 tablets by mouth at  bedtime. 03/10/20   [provider]  azelastine (ASTELIN) 0.1 % nasal spray Place into both nostrils 2 (two) times daily. Use in each nostril as directed    [provider]  carbidopa-levodopa (SINEMET IR) 25-100 MG tablet Take 1 tablet by mouth 3 (three) times daily.    [provider]  carbidopa-levodopa (SINEMET IR) 25-100 MG tablet Take 1 tablet by mouth at bedtime.    [provider]  Cariprazine HCl (VRAYLAR) 4.5 MG CAPS  03/30/20   [provider]  diazepam (VALIUM) 10 MG tablet Take 10 mg by mouth 3 (three) times daily as needed. 08/22/20   [provider]  DULoxetine (CYMBALTA) 60 MG capsule Take by mouth. 05/03/19   [provider]  EPINEPHrine 0.3 mg/0.3 mL IJ SOAJ injection Inject into the muscle once.    [provider]  fluconazole (DIFLUCAN) 200 MG tablet Take 200 mg by mouth daily.    [provider]  fluticasone-salmeterol (ADVAIR HFA) 230-21 MCG/ACT inhaler Inhale 2 puffs into the lungs 2 (two) times daily.    [provider]  HYDROcodone-acetaminophen (NORCO/VICODIN) 5-325 MG tablet Take by mouth. 11/09/17   [provider]  Ipratropium-Albuterol (COMBIVENT RESPIMAT) 20-100 MCG/ACT AERS respimat Inhale 1 puff into the lungs every 6 (six) hours.    [provider]  lamoTRIgine (LAMICTAL) 25 MG tablet Take 25 mg by mouth 2 (two) times daily. 08/14/20   [provider]  magnesium oxide (MAG-OX) 400 MG tablet Take 400 mg by mouth daily.    [provider]  methocarbamol (ROBAXIN) 750 MG tablet Take by mouth. 12/09/17   [provider]  montelukast (SINGULAIR) 10 MG tablet Take 10 mg by mouth at bedtime.    [provider]  Multiple Vitamin (MULTIVITAMIN) capsule Take 1 capsule by mouth daily.    [provider]  naproxen (NAPROSYN) 500 MG tablet Take 500 mg by mouth 2 (two) times daily with a meal.    [provider]  omega-3 acid  ethyl esters (LOVAZA) 1 g capsule Take by mouth 2 (two) times daily.    [provider]  Omega-3 Fatty Acids (FISH OIL) 1200 MG CAPS Take by mouth. 11/04/10   [provider]  ondansetron (ZOFRAN) 8 MG tablet Take by mouth every 8 (eight) hours as needed for nausea or vomiting.    [provider]  ondansetron (ZOFRAN-ODT) 8 MG disintegrating tablet Take by mouth. 04/25/14   [provider]  predniSONE (DELTASONE) 1 MG tablet Take 1 mg by mouth daily with breakfast.    [provider]  predniSONE (DELTASONE) 10 MG tablet Take 10 mg by mouth daily with breakfast.    [provider]  propranolol ER (INDERAL LA) 60 MG 24 hr  capsule Take 60 mg by mouth daily. 08/14/20   [provider]  rOPINIRole (REQUIP XL) 8 MG 24 hr tablet Take 8 mg by mouth at bedtime.    [provider]  SUMAtriptan (IMITREX) 50 MG tablet Take 50 mg by mouth every 2 (two) hours as needed for migraine. May repeat in 2 hours if headache persists or recurs.    [provider]    Physical Exam: Vitals:   08/26/20 0630 08/26/20 0700 08/26/20 0730 08/26/20 0800  BP: 131/68 134/77 126/77 136/76  Pulse: 96 97 94 98  Resp: (!) 26 (!) 24 20 (!) 23  Temp:      TempSrc:      SpO2: 100% 100% 98% 100%  Weight:      Height:       General: Not in acute distress HEENT:       Eyes: PERRL, EOMI, no scleral icterus.       ENT: No discharge from the ears and nose       Neck: No JVD, no bruit, no mass felt. Heme: No neck lymph node enlargement. Cardiac: S1/S2, RRR, No murmurs, No gallops or rubs. Respiratory: No rales, wheezing, rhonchi or rubs. GI: Soft, nondistended, nontender, no organomegaly, BS present. GU: No hematuria Ext: No pitting leg edema bilaterally. 1+DP/PT pulse bilaterally. Musculoskeletal: No joint deformities, No joint redness or warmth, no limitation of ROM in spin. Skin: No rashes.  Neuro: confused, not oriented X3, not following command,  cranial nerves II-XII grossly intact, moves all extremities normally. Psych: Patient is not psychotic, no suicidal or hemocidal ideation.  Labs on Admission: I have personally reviewed following labs and imaging studies  CBC: Recent Labs  Lab 08/26/20 0508  WBC 18.3*  NEUTROABS 16.7*  HGB 14.9  HCT 46.0  MCV 94.7  PLT 161*   Basic Metabolic Panel: Recent Labs  Lab 08/26/20 0508  NA 146*  K 4.8  CL 112*  CO2 12*  GLUCOSE 148*  BUN 43*  CREATININE 3.53*  CALCIUM 9.5   GFR: Estimated Creatinine Clearance: 16.7 mL/min (A) (by C-G formula based on SCr of 3.53 mg/dL (H)). Liver Function Tests: Recent Labs  Lab 08/26/20 0508  AST 46*  ALT 20  ALKPHOS 112  BILITOT 0.5  PROT 9.0*  ALBUMIN 4.5   No results for input(s): LIPASE, AMYLASE in the last 168 hours. No results for input(s): AMMONIA in the last 168 hours. Coagulation Profile: No results for input(s): INR, PROTIME in the last 168 hours. Cardiac Enzymes: No results for input(s): CKTOTAL, CKMB, CKMBINDEX, TROPONINI in the last 168 hours. BNP (last 3 results) No results for input(s): PROBNP in the last 8760 hours. HbA1C: No results for input(s): HGBA1C in the last 72 hours. CBG: No results for input(s): GLUCAP in the last 168 hours. Lipid Profile: No results for input(s): CHOL, HDL, LDLCALC, TRIG, CHOLHDL, LDLDIRECT in the last 72 hours. Thyroid Function Tests: No results for input(s): TSH, T4TOTAL, FREET4, T3FREE, THYROIDAB in the last 72 hours. Anemia Panel: No results for input(s): VITAMINB12, FOLATE, FERRITIN, TIBC, IRON, RETICCTPCT in the last 72 hours. Urine analysis:    Component Value Date/Time   COLORURINE YELLOW (A) 08/26/2020 0508   APPEARANCEUR CLOUDY (A) 08/26/2020 0508   APPEARANCEUR Clear 03/06/2013 2254   LABSPEC 1.009 08/26/2020 0508   LABSPEC 1.005 03/06/2013 2254   PHURINE 5.0 08/26/2020 0508   GLUCOSEU 50 (A) 08/26/2020 0508   GLUCOSEU Negative 03/06/2013 2254   HGBUR LARGE (A)  08/26/2020 0960  BILIRUBINUR NEGATIVE 08/26/2020 0508   BILIRUBINUR Negative 03/06/2013 2254   KETONESUR NEGATIVE 08/26/2020 0508   PROTEINUR 100 (A) 08/26/2020 0508   NITRITE NEGATIVE 08/26/2020 0508   LEUKOCYTESUR NEGATIVE 08/26/2020 0508   LEUKOCYTESUR Negative 03/06/2013 2254   Sepsis Labs: @LABRCNTIP (procalcitonin:4,lacticidven:4) ) Recent Results (from the past 240 hour(s))  Resp Panel by RT-PCR (Flu A&B, Covid) Urine, Clean Catch     Status: None   Collection Time: 08/26/20  5:08 AM   Specimen: Urine, Clean Catch; Nasopharyngeal(NP) swabs in vial transport medium  Result Value Ref Range Status   SARS Coronavirus 2 by RT PCR NEGATIVE NEGATIVE Final    Comment: (NOTE) SARS-CoV-2 target nucleic acids are NOT DETECTED.  The SARS-CoV-2 RNA is generally detectable in upper respiratory specimens during the acute phase of infection. The lowest concentration of SARS-CoV-2 viral copies this assay can detect is 138 copies/mL. A negative result does not preclude SARS-Cov-2 infection and should not be used as the sole basis for treatment or other patient management decisions. A negative result may occur with  improper specimen collection/handling, submission of specimen other than nasopharyngeal swab, presence of viral mutation(s) within the areas targeted by this assay, and inadequate number of viral copies(<138 copies/mL). A negative result must be combined with clinical observations, patient history, and epidemiological information. The expected result is Negative.  Fact Sheet for Patients:  EntrepreneurPulse.com.au  Fact Sheet for Healthcare Providers:  IncredibleEmployment.be  This test is no t yet approved or cleared by the [place] FDA and  has been authorized for detection and/or diagnosis of SARS-CoV-2 by FDA under an Emergency Use Authorization (EUA). This EUA will remain  in effect (meaning this test can be used) for the  duration of the COVID-19 declaration under Section 564(b)(1) of the Act, 21 U.S.C.section 360bbb-3(b)(1), unless the authorization is terminated  or revoked sooner.       Influenza A by PCR NEGATIVE NEGATIVE Final   Influenza B by PCR NEGATIVE NEGATIVE Final    Comment: (NOTE) The Xpert Xpress SARS-CoV-2/FLU/RSV plus assay is intended as an aid in the diagnosis of influenza from Nasopharyngeal swab specimens and should not be used as a sole basis for treatment. Nasal washings and aspirates are unacceptable for Xpert Xpress SARS-CoV-2/FLU/RSV testing.  Fact Sheet for Patients: EntrepreneurPulse.com.au  Fact Sheet for Healthcare Providers: IncredibleEmployment.be  This test is not yet approved or cleared by the [place] FDA and has been authorized for detection and/or diagnosis of SARS-CoV-2 by FDA under an Emergency Use Authorization (EUA). This EUA will remain in effect (meaning this test can be used) for the duration of the COVID-19 declaration under Section 564(b)(1) of the Act, 21 U.S.C. section 360bbb-3(b)(1), unless the authorization is terminated or revoked.  Performed at [facility], [address] [place], [place] [address]      Radiological Exams on Admission: CT Head Wo Contrast  Result Date: 08/26/2020 CLINICAL DATA:  Mental status change.  Unknown cause. EXAM: CT HEAD WITHOUT CONTRAST TECHNIQUE: Contiguous axial images were obtained from the base of the skull through the vertex without intravenous contrast. COMPARISON:  CT head 03/30/2009, MRI head 07/2010 FINDINGS: Brain: No evidence of large-territorial acute infarction. No parenchymal hemorrhage. No mass lesion. No extra-axial collection. No mass effect or midline shift. Redemonstration of a nonspecific tiny 2 mm hyperdensity within the foramen of [place] noted on axial imaging and not definitely visualized on coronal or sagittal view. Finding likely related to  normal anatomy/vasculature versus could represent a tiny colloid. No  hydrocephalus. Basilar cisterns are patent. Vascular: No hyperdense vessel. Atherosclerotic calcifications are present within the cavernous internal carotid arteries. Skull: No acute fracture or focal lesion. Sinuses/Orbits: Paranasal sinuses and mastoid air cells are clear. Bilateral lens replacement. Otherwise the orbits are unremarkable. Other: None. IMPRESSION: No acute intracranial abnormality. Electronically Signed   By: Iven Finn M.D.   On: 08/26/2020 06:07   DG Chest Port 1 View  Result Date: 08/26/2020 CLINICAL DATA:  Weakness.  Found unresponsive.  Short of breath. EXAM: PORTABLE CHEST 1 VIEW COMPARISON:  Chest x-ray 05/02/2013, CT chest 09/13/2007 FINDINGS: The heart size and mediastinal contours are within normal limits. Low lung volumes. No focal consolidation. No pulmonary edema. No pleural effusion. No pneumothorax. No acute osseous abnormality.  Cervical surgical hardware noted. IMPRESSION: Low lung volumes with no acute cardiopulmonary disease. Electronically Signed   By: Iven Finn M.D.   On: 08/26/2020 05:19     EKG: I have personally reviewed.  Sinus rhythm, QTC 482, early R wave progression, ST depression in the inferior leads in the precordial leads  Assessment/Plan Principal Problem:   Severe sepsis with septic shock (HCC) Active Problems:   Bipolar 1 disorder (HCC)   COPD (chronic obstructive pulmonary disease) (HCC)   Essential tremor   Migraine without aura   Parkinson's disease ([place])   Epilepsy ([place])   Depression with anxiety   HTN (hypertension)   Acute metabolic encephalopathy   UTI (urinary tract infection)   AKI (acute kidney injury) ([place])   Severe sepsis with septic shock due to possible UTI: Patient meets criteria for severe sepsis with septic shock with WBC 18.3, tachycardia with heart rate of 105, tachypnea with RR 34.  Lactic acid is elevated up to 6.7.  Currently  hemodynamically stable.  Source of infection is not completely clear, but possibly due to UTI.  Urinalysis showed cloudy appearance, negative leukocyte, rare bacteria, WBC 11-20.  Since patient has acute AKI with creatinine up from 0.8 to 3.53, will get CT scan to rule out obstructive uropathy.  -Admitted to progressive bed as inpatient -Vancomycin, cefepime and Flagyl started in ED --> will continue vancomycin and cefepime -Follow-up blood culture urine culture -will get Procalcitonin and trend lactic acid levels per sepsis protocol. -IVF: 2L of LR bolus in ED, followed by 100 cc/h  - f/u CT-renal stone study.  AKI (acute kidney injury) ([place]): Recent creatinine 0.8 on 01/31/2020.  Her creatinine is 3.53, BUN 43.  Likely multifactorial etiology, including possible UTI, dehydration and continuation of naproxen. Will also need to r/o obstructive uropathy - IVF as above - Follow up renal function by BMP - Avoid using renal toxic medications, hypotension and contrast dye (or carefully use) - Hold naproxen - f/u CT-renal stone study.  Acute metabolic encephalopathy: Etiology is not clear, likely multifactorial etiology, including severe sepsis with septic shock, UTI.  Patient is taking multiple sedative medications, indicating possible polypharmacy.  Patient is taking amitriptyline, cariprazine, Valium, Cymbalta. Also has Norco, Robaxin, Requip on list, not sure if she takes or not.  CT head is negative for acute intracranial abnormalities.  No focal neuro deficit on physical examination -Will hold sedative medications -Frequent neuro check  Bipolar 1 disorder and depression with anxiety: Patient is agitated, restless and confused -Hold home medications as above due to concerning for polypharmacy -Consult psychiatry, Dr. Weber Cooks  COPD (chronic obstructive pulmonary disease) ([place]): Stable.  No oxygen desaturation. -Bronchodilators  Essential tremor -Propranolol  Migraine without  aura -Continue home as needed tylenol  Parkinson's disease ([place]) -Sinemet  Epilepsy ([place]) -Seizure precaution -When necessary Ativan for seizure -Continue Home medications: Lamictal  HTN (hypertension) -On propranolol      DVT ppx:  SQ Lovenox  Code Status: DNR per her mother Family Communication:   Yes, patient's mother by phone Disposition Plan:  Anticipate discharge back to previous environment Consults called:  Dr. Weber Cooks of psychiatry Admission status and Level of care: Progressive Cardiac:   as inpt       Status is: Inpatient  Remains inpatient appropriate because:Inpatient level of care appropriate due to severity of illness   Dispo: The patient is from: Home              Anticipated d/c is to: Home              Anticipated d/c date is: 2 days              Patient currently is not medically stable to d/c.   Difficult to place patient No          Date of Service 08/26/2020    [facility]   If 7PM-7AM, please contact night-coverage [email or web address] 08/26/2020, 8:50 AM

## 2020-08-26 NOTE — ED Notes (Signed)
Dr Mariam Dollar at bedside

## 2020-08-26 NOTE — ED Provider Notes (Signed)
[facility] Emergency Department Provider Note   ____________________________________________   Event Date/Time   First MD Initiated Contact with Patient 08/26/20 [id number]     (approximate)  I have reviewed the triage vital signs and the nursing notes.   HISTORY  Chief Complaint Altered Mental Status  Level of V caveat: Limited by altered mentation  HPI Stacy Pearson is a 60 y.o. female brought to the ED via EMS from home with a chief complaint of altered mentation. Patient has a history of bipolar disorder, COPD, hypertension, Parkinson's disease who lives with her mother. Mother told EMS it is not unusual for patient to take her bipolar medications and to sleep for several days straight. Patient was last seen in her normal state of health Friday afternoon. Mother decided to wake her tonight as she appeared to have increased work of breathing. Once awake, mother states patient was altered and not following commands which is unusual for her. Patient arrives to the ED awake, alert, agitated, restless and cursing. Rest of history is unobtainable secondary to altered mentation.     Past Medical History:  Diagnosis Date  . Abdominal pain   . Allergic genetic state   . Anxiety   . Asthma   . Basal cell carcinoma   . Bipolar disorder ([place])   . Cancer ([place])    MOS surgery basal cell face  . COPD (chronic obstructive pulmonary disease) ([place])   . Depression   . Eating disorder   . Eczema   . Encounter for blood transfusion   . Essential tremor   . 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 GERD (gastroesophageal reflux disease)   . Headache   . History of basal cell carcinoma   . Hypertension   . Neuropathy   . Parkinson's disease ([place])   . Parkinson's disease ([place])   . Parkinsonism [facility])     Patient Active Problem List   Diagnosis Date Noted  . Severe sepsis ([place]) 08/26/2020  . Acute diarrhea 03/08/2018  . Status post  cervical spinal fusion 12/31/2017  . Cervical spondylosis with radiculopathy 10/28/2017  . Colon polyp 10/08/2017  . Essential tremor 10/08/2017  . Cervical radiculopathy 09/03/2017  . Allergic rhinitis 12/30/2016  . Sting of hornets, wasps, and bees causing poisoning and toxic reactions 12/30/2016  . Allergic rhinitis due to dogs 12/17/2016  . Asthma, currently active 12/17/2016  . Muscle cramps 11/18/2016  . Sleep disorder 08/25/2016  . At risk for falls 08/19/2016  . Elevated vitamin B12 level 08/19/2016  . History of fall within past 90 days 08/19/2016  . Nausea and vomiting 08/19/2016  . Parkinson's disease ([place]) 08/19/2016  . Diaphoresis 03/25/2016  . Dry scalp 03/25/2016  . Dry skin 03/25/2016  . Back pain 07/04/2015  . Tremor 01/02/2014  . Asthma 07/28/2013  . COPD (chronic obstructive pulmonary disease) ([place]) 07/28/2013  . Ulnar neuropathy of left upper extremity 06/06/2013  . Meibomian gland dysfunction 11/09/2012  . Raynaud phenomenon 05/03/2012  . Anorexia nervosa with bulimia 01/17/2011  . Basal cell carcinoma of other specified sites of skin 01/17/2011  . Dyspepsia and other specified disorders of function of stomach 01/17/2011  . Generalized anxiety disorder 01/17/2011  . 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 Migraine without aura 01/17/2011  . Other atopic dermatitis and related conditions 01/17/2011  . Bipolar 1 disorder ([place]) 04/28/1995    Past Surgical History:  Procedure Laterality Date  . ABDOMINAL HYSTERECTOMY    .  ADENOIDECTOMY    . BLADDER SURGERY    . bone grafts    . CATARACT EXTRACTION W/ INTRAOCULAR LENS IMPLANT    . CERVICAL FUSION    . COLONOSCOPY    . COLONOSCOPY WITH ESOPHAGOGASTRODUODENOSCOPY (EGD)    . COLONOSCOPY WITH PROPOFOL N/A 04/28/2018   Procedure: COLONOSCOPY WITH PROPOFOL;  Surgeon: Manya Silvas, MD;  Location: [facility] ENDOSCOPY;  Service: Endoscopy;  Laterality: N/A;  . EYE SURGERY    . GRAFT APPLICATION      in neck  . LAPAROSCOPIC BILATERAL SALPINGO OOPHERECTOMY    . NEUROPLASTY / TRANSPOSITION ULNAR NERVE AT ELBOW    . OOPHORECTOMY    . SKIN CANCER EXCISION    . skin grafts   left forearm; hand bil.lower extremeties  . titanium plate    . TONSILLECTOMY    . TUBAL LIGATION      Prior to Admission medications   Medication Sig Start Date End Date Taking? Authorizing Provider  albuterol (PROVENTIL) (2.5 MG/3ML) 0.083% nebulizer solution Inhale into the lungs. 12/14/18   [provider]  amitriptyline (ELAVIL) 50 MG tablet Take 1-2 tablets by mouth at bedtime. 03/10/20   [provider]  azelastine (ASTELIN) 0.1 % nasal spray Place into both nostrils 2 (two) times daily. Use in each nostril as directed    [provider]  carbidopa-levodopa (SINEMET IR) 25-100 MG tablet Take 1 tablet by mouth 3 (three) times daily.    [provider]  carbidopa-levodopa (SINEMET IR) 25-100 MG tablet Take 1 tablet by mouth at bedtime.    [provider]  Cariprazine HCl (VRAYLAR) 4.5 MG CAPS  03/30/20   [provider]  diazepam (VALIUM) 10 MG tablet Take 10 mg by mouth 3 (three) times daily as needed. 08/22/20   [provider]  DULoxetine (CYMBALTA) 60 MG capsule Take by mouth. 05/03/19   [provider]  EPINEPHrine 0.3 mg/0.3 mL IJ SOAJ injection Inject into the muscle once.    [provider]  fluconazole (DIFLUCAN) 200 MG tablet Take 200 mg by mouth daily.    [provider]  fluticasone-salmeterol (ADVAIR HFA) 230-21 MCG/ACT inhaler Inhale 2 puffs into the lungs 2 (two) times daily.    [provider]  HYDROcodone-acetaminophen (NORCO/VICODIN) 5-325 MG tablet Take by mouth. 11/09/17   [provider]  Ipratropium-Albuterol (COMBIVENT RESPIMAT) 20-100 MCG/ACT AERS respimat Inhale 1 puff into the lungs every 6 (six) hours.    [provider]  lamoTRIgine (LAMICTAL) 25 MG tablet Take 25 mg by mouth 2  (two) times daily. 08/14/20   [provider]  magnesium oxide (MAG-OX) 400 MG tablet Take 400 mg by mouth daily.    [provider]  methocarbamol (ROBAXIN) 750 MG tablet Take by mouth. 12/09/17   [provider]  montelukast (SINGULAIR) 10 MG tablet Take 10 mg by mouth at bedtime.    [provider]  Multiple Vitamin (MULTIVITAMIN) capsule Take 1 capsule by mouth daily.    [provider]  naproxen (NAPROSYN) 500 MG tablet Take 500 mg by mouth 2 (two) times daily with a meal.    [provider]  omega-3 acid ethyl esters (LOVAZA) 1 g capsule Take by mouth 2 (two) times daily.    [provider]  Omega-3 Fatty Acids (FISH OIL) 1200 MG CAPS Take by mouth. 11/04/10   [provider]  ondansetron (ZOFRAN) 8 MG tablet Take by mouth every 8 (eight) hours as needed for nausea or vomiting.  [provider]  ondansetron (ZOFRAN-ODT) 8 MG disintegrating tablet Take by mouth. 04/25/14   [provider]  predniSONE (DELTASONE) 1 MG tablet Take 1 mg by mouth daily with breakfast.    [provider]  predniSONE (DELTASONE) 10 MG tablet Take 10 mg by mouth daily with breakfast.    [provider]  propranolol ER (INDERAL LA) 60 MG 24 hr capsule Take 60 mg by mouth daily. 08/14/20   [provider]  rOPINIRole (REQUIP XL) 8 MG 24 hr tablet Take 8 mg by mouth at bedtime.    [provider]  SUMAtriptan (IMITREX) 50 MG tablet Take 50 mg by mouth every 2 (two) hours as needed for migraine. May repeat in 2 hours if headache persists or recurs.    [provider]    Allergies Codeine and Prednisone  No family history on file.  Social History Social History   Tobacco Use  . Smoking status: Former Smoker    Types: Cigarettes    Quit date: 07/14/2006    Years since quitting: 14.1  . Smokeless tobacco: Never Used  Substance Use Topics  . Alcohol use: Never  . Drug use: Never     Review of Systems  Constitutional: No fever/chills Eyes: No visual changes. ENT: No sore throat. Cardiovascular: Denies chest pain. Respiratory: Denies shortness of breath. Gastrointestinal: No abdominal pain.  No nausea, no vomiting.  No diarrhea.  No constipation. Genitourinary: Negative for dysuria. Musculoskeletal: Negative for back pain. Skin: Negative for rash. Neurological: Positive for altered mentation. Negative for headaches, focal weakness or numbness.   ____________________________________________   PHYSICAL EXAM:  VITAL SIGNS: ED Triage Vitals  Enc Vitals Group     BP 08/26/20 0453 111/64     Pulse Rate 08/26/20 0453 (!) 105     Resp 08/26/20 0453 19     Temp 08/26/20 0457 98.3 F (36.8 C)     Temp Source 08/26/20 0457 Oral     SpO2 08/26/20 0453 92 %     Weight 08/26/20 0458 135 lb 12.9 oz (61.6 kg)     Height 08/26/20 0458 5\' 7"  (1.702 m)     Head Circumference --      Peak Flow --      Pain Score 08/26/20 0458 0     Pain Loc --      Pain Edu? --      Excl. in [place]? --     Constitutional: Alert and oriented. Appears older than stated age and in mild acute distress. Eyes: Conjunctivae are normal. PERRL. EOMI. Head: Atraumatic. Nose: No congestion/rhinnorhea. Mouth/Throat: Mucous membranes are mildly dry.  Neck: No stridor.   Cardiovascular: Normal rate, regular rhythm. Grossly normal heart sounds.  Good peripheral circulation. Respiratory: Normal respiratory effort.  No retractions. Lungs CTAB. Gastrointestinal: Soft and nontender. No distention. No abdominal bruits. No CVA tenderness. Musculoskeletal: No lower extremity tenderness nor edema.  No joint effusions. Neurologic: Alert and oriented to person only. Slurred speech and language. No gross focal neurologic deficits are appreciated. Obeys only simple commands. Cannot follow directions for NIH scale. MAEx4. Skin:  Skin is warm, dry and intact. No rash noted. Psychiatric: Mood and affect are  agitated. Speech and behavior are normal.  ____________________________________________   LABS (all labs ordered are listed, but only abnormal results are displayed)  Labs Reviewed  CBC WITH DIFFERENTIAL/PLATELET - Abnormal; Notable for the following components:      Result Value   WBC 18.3 (*)  Platelets 475 (*)    Neutro Abs 16.7 (*)    Lymphs Abs 0.6 (*)    Abs Immature Granulocytes 0.13 (*)    All other components within normal limits  COMPREHENSIVE METABOLIC PANEL - Abnormal; Notable for the following components:   Sodium 146 (*)    Chloride 112 (*)    CO2 12 (*)    Glucose, Bld 148 (*)    BUN 43 (*)    Creatinine, Ser 3.53 (*)    Total Protein 9.0 (*)    AST 46 (*)    GFR, Estimated 14 (*)    Anion gap 22 (*)    All other components within normal limits  LACTIC ACID, PLASMA - Abnormal; Notable for the following components:   Lactic Acid, Venous 6.7 (*)    All other components within normal limits  URINALYSIS, COMPLETE (UACMP) WITH MICROSCOPIC - Abnormal; Notable for the following components:   Color, Urine YELLOW (*)    APPearance CLOUDY (*)    Glucose, UA 50 (*)    Hgb urine dipstick LARGE (*)    Protein, ur 100 (*)    RBC / HPF >50 (*)    Bacteria, UA RARE (*)    All other components within normal limits  RESP PANEL BY RT-PCR (FLU A&B, COVID) ARPGX2  CULTURE, BLOOD (ROUTINE X 2)  CULTURE, BLOOD (ROUTINE X 2)  URINE CULTURE  PROCALCITONIN  LACTIC ACID, PLASMA  TROPONIN I (HIGH SENSITIVITY)  TROPONIN I (HIGH SENSITIVITY)   ____________________________________________  EKG  ED ECG REPORT I, Aubery Douthat J, the attending physician, personally viewed and interpreted this ECG.   Date: 08/26/2020  EKG Time: 0504  Rate: 99  Rhythm: normal EKG, normal sinus rhythm  Axis: Normal  Intervals:none  ST&T Change: Nonspecific  ____________________________________________  RADIOLOGY I, Emer Onnen J, personally viewed and evaluated these images (plain  radiographs) as part of my medical decision making, as well as reviewing the written report by the radiologist.  ED MD interpretation: No acute cardiopulmonary process  Official radiology report(s): CT Head Wo Contrast  Result Date: 08/26/2020 CLINICAL DATA:  Mental status change.  Unknown cause. EXAM: CT HEAD WITHOUT CONTRAST TECHNIQUE: Contiguous axial images were obtained from the base of the skull through the vertex without intravenous contrast. COMPARISON:  CT head 03/30/2009, MRI head 07/2010 FINDINGS: Brain: No evidence of large-territorial acute infarction. No parenchymal hemorrhage. No mass lesion. No extra-axial collection. No mass effect or midline shift. Redemonstration of a nonspecific tiny 2 mm hyperdensity within the foramen of [place] noted on axial imaging and not definitely visualized on coronal or sagittal view. Finding likely related to normal anatomy/vasculature versus could represent a tiny colloid. No hydrocephalus. Basilar cisterns are patent. Vascular: No hyperdense vessel. Atherosclerotic calcifications are present within the cavernous internal carotid arteries. Skull: No acute fracture or focal lesion. Sinuses/Orbits: Paranasal sinuses and mastoid air cells are clear. Bilateral lens replacement. Otherwise the orbits are unremarkable. Other: None. IMPRESSION: No acute intracranial abnormality. Electronically Signed   By: Iven Finn M.D.   On: 08/26/2020 06:07   DG Chest Port 1 View  Result Date: 08/26/2020 CLINICAL DATA:  Weakness.  Found unresponsive.  Short of breath. EXAM: PORTABLE CHEST 1 VIEW COMPARISON:  Chest x-ray 05/02/2013, CT chest 09/13/2007 FINDINGS: The heart size and mediastinal contours are within normal limits. Low lung volumes. No focal consolidation. No pulmonary edema. No pleural effusion. No pneumothorax. No acute osseous abnormality.  Cervical surgical hardware noted. IMPRESSION: Low lung volumes with no acute cardiopulmonary  disease. Electronically  Signed   By: Iven Finn M.D.   On: 08/26/2020 05:19    ____________________________________________   PROCEDURES  Procedure(s) performed (including Critical Care):  .1-3 Lead EKG Interpretation Performed by: Paulette Blanch, MD Authorized by: Paulette Blanch, MD     Interpretation: normal     ECG rate:  99   ECG rate assessment: normal     Rhythm: sinus rhythm     Ectopy: none     Conduction: normal   Comments:     Placed on cardiac monitor to evaluate for arrhythmias     ____________________________________________   INITIAL IMPRESSION / ASSESSMENT AND PLAN / ED COURSE  As part of my medical decision making, I reviewed the following data within the [place] notes reviewed and incorporated, Labs reviewed, EKG interpreted, Old chart reviewed, Radiograph reviewed, Discussed with admitting physician and Notes from prior ED visits     60 year old female brought to the ED for altered mental status Differential diagnosis includes, but is not limited to, alcohol, illicit or prescription medications, or other toxic ingestion; intracranial pathology such as stroke or intracerebral hemorrhage; fever or infectious causes including sepsis; hypoxemia and/or hypercarbia; uremia; trauma; endocrine related disorders such as diabetes, hypoglycemia, and thyroid-related diseases; hypertensive encephalopathy; etc.  We will obtain basic lab work, CT head. Patient unable to follow commands to assess NIH score. She was last seen in her usual state of health the afternoon of 08/24/2020. She is out of the window for TPA or procedural intervention for CVA. Requires calming agent for CT scan. Anticipate hospitalization. Clinical Course as of 08/26/20 6160  Sun Aug 26, 2020  0601 Sepsis reassessment - remains hemodynamically stable. IV fluids and abx infusing. [JS]    Clinical Course User Index [JS] Paulette Blanch, MD      ____________________________________________   FINAL CLINICAL IMPRESSION(S) / ED DIAGNOSES  Final diagnoses:  Altered mental status, unspecified altered mental status type  Confusion  Sepsis, due to unspecified organism, unspecified whether acute organ dysfunction present [facility])     ED Discharge Orders    None      *Please note:  Stacy Pearson was evaluated in Emergency Department on 08/26/2020 for the symptoms described in the history of present illness. She was evaluated in the context of the global COVID-19 pandemic, which necessitated consideration that the patient might be at risk for infection with the SARS-CoV-2 virus that causes COVID-19. Institutional protocols and algorithms that pertain to the evaluation of patients at risk for COVID-19 are in a state of rapid change based on information released by regulatory bodies including the CDC and federal and state organizations. These policies and algorithms were followed during the patient's care in the ED.  Some ED evaluations and interventions may be delayed as a result of limited staffing during and the pandemic.*   Note:  This document was prepared using Dragon voice recognition software and may include unintentional dictation errors.   Paulette Blanch, MD 08/26/20 [id number]

## 2020-08-26 NOTE — Progress Notes (Signed)
CODE SEPSIS - PHARMACY COMMUNICATION  **Broad Spectrum Antibiotics should be administered within 1 hour of Sepsis diagnosis**  Time Code Sepsis Called/Page Received: 6886  Antibiotics Ordered: Cefepime. Flagyl, & Vancomcyin  Time of 1st antibiotic administration: 0609  Renda Rolls, PharmD, [facility] 08/26/2020 6:12 AM

## 2020-08-26 NOTE — ED Notes (Signed)
Attempted to give pt apple juice thru straw.  Unable to use straw.  Could not suck, but was blowing the juice from the cup.   Pt able to drink 2 apple juices when given with syringe.  Also ate applesause without difficulty

## 2020-08-26 NOTE — ED Notes (Signed)
Handoff report provided to Melody Comas, RN. Pt to be transported at this time.

## 2020-08-26 NOTE — Progress Notes (Signed)
PHARMACIST - PHYSICIAN COMMUNICATION  CONCERNING:  Enoxaparin (Lovenox) for DVT Prophylaxis    RECOMMENDATION: Patient was prescribed enoxaprin 40mg  q24 hours for VTE prophylaxis.   Filed Weights   08/26/20 0458  Weight: 61.6 kg (135 lb 12.9 oz)    Body mass index is 21.27 kg/m.  Estimated Creatinine Clearance: 16.7 mL/min (A) (by C-G formula based on SCr of 3.53 mg/dL (H)).  Patient is candidate for enoxaparin 30mg  every 24 hours based on CrCl <30ml/min   DESCRIPTION: Pharmacy has adjusted enoxaparin dose per [facility] policy.  Patient is now receiving enoxaparin 30 mg every 24 hours    Dallie Piles, PharmD Clinical Pharmacist  08/26/2020 10:43 AM

## 2020-08-26 NOTE — ED Notes (Signed)
2nd set of blood cultures drawn by IV team

## 2020-08-27 DIAGNOSIS — R4182 Altered mental status, unspecified: Secondary | ICD-10-CM

## 2020-08-27 DIAGNOSIS — N179 Acute kidney failure, unspecified: Secondary | ICD-10-CM | POA: Diagnosis not present

## 2020-08-27 DIAGNOSIS — R410 Disorientation, unspecified: Secondary | ICD-10-CM | POA: Diagnosis not present

## 2020-08-27 LAB — URINE CULTURE: Culture: NO GROWTH

## 2020-08-27 LAB — URINE DRUG SCREEN, QUALITATIVE (ARMC ONLY)
Amphetamines, Ur Screen: NOT DETECTED
Barbiturates, Ur Screen: NOT DETECTED
Benzodiazepine, Ur Scrn: POSITIVE — AB
Cannabinoid 50 Ng, Ur ~~LOC~~: NOT DETECTED
Cocaine Metabolite,Ur ~~LOC~~: NOT DETECTED
MDMA (Ecstasy)Ur Screen: NOT DETECTED
Methadone Scn, Ur: NOT DETECTED
Opiate, Ur Screen: NOT DETECTED
Phencyclidine (PCP) Ur S: NOT DETECTED
Tricyclic, Ur Screen: POSITIVE — AB

## 2020-08-27 LAB — SALICYLATE LEVEL: Salicylate Lvl: <7 mg/dL — ABNORMAL LOW (ref 7.0–30.0)

## 2020-08-27 LAB — CBC
HCT: 35.2 % — ABNORMAL LOW (ref 36.0–46.0)
Hemoglobin: 11.8 g/dL — ABNORMAL LOW (ref 12.0–15.0)
MCH: 31.1 pg (ref 26.0–34.0)
MCHC: 33.5 g/dL (ref 30.0–36.0)
MCV: 92.6 fL (ref 80.0–100.0)
Platelets: 281 10*3/uL (ref 150–400)
RBC: 3.8 MIL/uL — ABNORMAL LOW (ref 3.87–5.11)
RDW: 15.2 % (ref 11.5–15.5)
WBC: 15.3 10*3/uL — ABNORMAL HIGH (ref 4.0–10.5)
nRBC: 0 % (ref 0.0–0.2)

## 2020-08-27 LAB — ACETAMINOPHEN LEVEL: Acetaminophen (Tylenol), Serum: <10 ug/mL — ABNORMAL LOW (ref 10–30)

## 2020-08-27 LAB — LACTIC ACID, PLASMA
Lactic Acid, Venous: 3 mmol/L (ref 0.5–1.9)
Lactic Acid, Venous: 2.1 mmol/L (ref 0.5–1.9)

## 2020-08-27 LAB — BASIC METABOLIC PANEL
Anion gap: 18 — ABNORMAL HIGH (ref 5–15)
BUN: 63 mg/dL — ABNORMAL HIGH (ref 6–20)
CO2: 17 mmol/L — ABNORMAL LOW (ref 22–32)
Calcium: 9.2 mg/dL (ref 8.9–10.3)
Chloride: 110 mmol/L (ref 98–111)
Creatinine, Ser: 5.55 mg/dL — ABNORMAL HIGH (ref 0.44–1.00)
GFR, Estimated: 8 mL/min — ABNORMAL LOW (ref >60)
Glucose, Bld: 75 mg/dL (ref 70–99)
Potassium: 5.4 mmol/L — ABNORMAL HIGH (ref 3.5–5.1)
Sodium: 145 mmol/L (ref 135–145)

## 2020-08-27 LAB — VITAMIN B12: Vitamin B-12: 221 pg/mL (ref 180–914)

## 2020-08-27 LAB — FOLATE: Folate: 31 ng/mL (ref >5.9)

## 2020-08-27 LAB — RAPID HIV SCREEN (HIV 1/2 AB+AG)
HIV 1/2 Antibodies: NONREACTIVE
HIV-1 P24 Antigen - HIV24: NONREACTIVE

## 2020-08-27 LAB — TSH: TSH: 1.155 u[IU]/mL (ref 0.350–4.500)

## 2020-08-27 LAB — CK: Total CK: 364 U/L — ABNORMAL HIGH (ref 38–234)

## 2020-08-27 LAB — VANCOMYCIN, RANDOM: Vancomycin Rm: 22

## 2020-08-27 LAB — AMMONIA: Ammonia: 28 umol/L (ref 9–35)

## 2020-08-27 MED ORDER — CARBIDOPA-LEVODOPA 25-100 MG PO TABS
1.0000 | ORAL_TABLET | Freq: Every day | ORAL | Status: DC
  Administered 2020-09-01 – 2020-09-02 (×2): 1 via ORAL
  Filled 2020-08-27 (×8): qty 1

## 2020-08-27 MED ORDER — SODIUM CHLORIDE 0.9 % IV SOLN: 1.0000 g | INTRAVENOUS | Status: DC

## 2020-08-27 MED ORDER — CARBIDOPA-LEVODOPA ER 25-100 MG PO TBCR
1.0000 | EXTENDED_RELEASE_TABLET | Freq: Three times a day (TID) | ORAL | Status: DC
  Administered 2020-08-27 – 2020-09-02 (×7): 1 via ORAL
  Filled 2020-08-27 (×24): qty 1

## 2020-08-27 MED ORDER — CARBIDOPA-LEVODOPA 25-100 MG PO TABS
2.0000 | ORAL_TABLET | Freq: Two times a day (BID) | ORAL | Status: DC
  Administered 2020-08-27 – 2020-09-02 (×5): 2 via ORAL
  Filled 2020-08-27 (×15): qty 2

## 2020-08-27 MED ORDER — VANCOMYCIN VARIABLE DOSE PER UNSTABLE RENAL FUNCTION (PHARMACIST DOSING): Status: DC

## 2020-08-27 MED ORDER — SODIUM CHLORIDE 0.9 % IV SOLN
1.0000 g | INTRAVENOUS | Status: AC
  Administered 2020-08-28: 1 g via INTRAVENOUS
  Filled 2020-08-27 (×2): qty 1

## 2020-08-27 MED ORDER — THIAMINE HCL 100 MG/ML IJ SOLN: 100.0000 mg | Freq: Every day | INTRAMUSCULAR | Status: DC

## 2020-08-27 MED ORDER — VANCOMYCIN HCL 1000 MG/200ML IV SOLN
1000.0000 mg | INTRAVENOUS | Status: AC
  Administered 2020-08-28: 1000 mg via INTRAVENOUS
  Filled 2020-08-27: qty 200

## 2020-08-27 MED ORDER — SODIUM CHLORIDE 0.9 % IV SOLN
2.0000 g | INTRAVENOUS | Status: DC
  Filled 2020-08-27: qty 20

## 2020-08-27 MED ORDER — SODIUM BICARBONATE 8.4 % IV SOLN
INTRAVENOUS | Status: AC
  Filled 2020-08-27 (×2): qty 150
  Filled 2020-08-27: qty 850
  Filled 2020-08-27: qty 150
  Filled 2020-08-27: qty 850

## 2020-08-27 MED ORDER — DEXTROSE 5 % IV SOLN
10.0000 mg/kg | INTRAVENOUS | Status: DC
  Administered 2020-08-27: 640 mg via INTRAVENOUS
  Filled 2020-08-27 (×2): qty 12.8

## 2020-08-27 MED ORDER — CARBIDOPA-LEVODOPA ER 25-100 MG PO TBCR
2.0000 | EXTENDED_RELEASE_TABLET | Freq: Every day | ORAL | Status: DC
  Administered 2020-08-27 – 2020-09-02 (×3): 2 via ORAL
  Filled 2020-08-27 (×7): qty 2

## 2020-08-27 MED ORDER — THIAMINE HCL 100 MG/ML IJ SOLN
500.0000 mg | Freq: Three times a day (TID) | INTRAVENOUS | Status: DC
  Administered 2020-08-27 – 2020-08-28 (×2): 500 mg via INTRAVENOUS
  Filled 2020-08-27 (×6): qty 5

## 2020-08-27 MED ORDER — SODIUM ZIRCONIUM CYCLOSILICATE 10 G PO PACK
10.0000 g | PACK | Freq: Every day | ORAL | Status: DC
  Administered 2020-08-27: 10 g via ORAL
  Filled 2020-08-27 (×2): qty 1

## 2020-08-27 MED ORDER — THIAMINE HCL 100 MG/ML IJ SOLN: 250.0000 mg | Freq: Every day | INTRAVENOUS | Status: DC

## 2020-08-27 NOTE — Progress Notes (Signed)
Unable to complete admission. Patient does not respond to questions or follow commands. She will occasionally say "yes" or "no" to some questions. Able to take meds whole without problem. Will continue with the current plan of care and rounding on patient.

## 2020-08-27 NOTE — Progress Notes (Addendum)
PROGRESS NOTE    Stacy Pearson  YPP:[id number] DOB: 05-Mar-1961 DOA: 08/26/2020 PCP: Valera Castle, MD   Brief Narrative: Taken from H&P. Stacy Pearson is a 60 y.o. female with medical history significant of bipolar, depression, anxiety, Parkinson, epilepsy, HTN, COPD, presents with AMS. On presentation patient was confused and agitated.  Admitting provider was able to contact her mother who is the only contact listed, and according to her she lives alone and was at her normal state of health until Friday afternoon. She was sleeping most of the time since then and found to be more confused and somnolent and EMS was called. Mother was not aware of any recent illnesses. On presentation she was found to have neutrophilic predominant leukocytosis, lactic acidosis at 6.7, Covid PCR negative, AKI, tachycardic and tachypneic.  Chest x-ray with low lung volumes and no infiltrate.  CT head was negative.  Lactic acidosis started improving, worsening renal function and hyperkalemia next morning.  Procalcitonin elevated at 1.77.  Blood and urine cultures negative.  Remained very altered, unable to provide any history.  Subjective: Patient remained very altered, appears somnolent, easily arousable and keeps saying yes ma'am and not following any command.  Not comprehending any statement.  Tried calling mother twice with no response.  Assessment & Plan:   Principal Problem:   Delirium Active Problems:   Bipolar 1 disorder (HCC)   COPD (chronic obstructive pulmonary disease) (HCC)   Essential tremor   Migraine without aura   Parkinson's disease ([place])   Epilepsy ([place])   Depression with anxiety   HTN (hypertension)   Acute metabolic encephalopathy   UTI (urinary tract infection)   AKI (acute kidney injury) ([place])   Severe sepsis with septic shock (HCC)  Altered mental status.  Initially met severe sepsis criteria with leukocytosis, tachycardic, tachypneic, lactic acidosis, cultures  remain negative, sepsis cannot be ruled in at this time without any obvious source of infection. Initial CT head negative.  Procalcitonin at 1.77.  Worsening renal function. Patient is having quite broad differential at this time which include CNS infection, polypharmacy, drug overdose. -Neurology consult to rule out any CNS infection. -UDS -Check CK [id number] -Salicylates and acetaminophen levels-within normal limit. -Discontinue vancomycin due to worsening renal function and no obvious source. -Switch cefepime with ceftriaxone. -Keep holding sedative medications.  AKI.  Worsening renal function.  Baseline creatinine below 1 Nephrology was consulted-appreciate their help. CT stone studies without any significant abnormalities except a hypodensity in the bladder wall which can represent some bleed without any wall thickness. UA was positive for RBCs, urine cultures negative. Nephrology is recommending continue with IV fluid-some concern of diabetes insipidus with her underlying psych issues and meds. -Monitor renal function -Avoid nephrotoxins  Hyperkalemia.  Most likely secondary to worsening renal function.  Concern of renal tubular acidosis. -Try Lokelma. -Monitor potassium.  Anion gap metabolic acidosis.  Most likely secondary to lactic acidosis. Elevated CK so rhabdomyolysis can be playing a role. -Nephrology started her on bicarb infusion. -Pending UDS screening  Bipolar 1 disorder with depression and anxiety.  Psych was consulted from ED, they are recommending continuation of benzos with Ativan as she was taking very high dose at baseline to prevent any withdrawal. -Continue with Ativan -Keep holding rest of her sedating medications.  COPD.  Appears stable, no wheezing and chest x-ray without any acute abnormality. -As needed bronchodilators  History of Parkinson's disease.  Unknown baseline -Continue with home dose of Sinemet. -Continue with propranolol-do not know  whether tremors are essential or secondary to underlying Parkinson's.  History of migraine. -Continue as needed Tylenol  History of seizure disorder ??  No witnessed seizure-like activity. -Continue home dose of Lamictal-not sure whether she was taking it for seizures. -Continue with Ativan as needed. -EEG  Hypertension.  Blood pressure mildly elevated. On home propranolol. -Can do as needed hydralazine.   Objective: Vitals:   08/27/20 0100 08/27/20 0421 08/27/20 0820 08/27/20 1139  BP:  133/85 (!) 145/74 (!) 142/80  Pulse:  75 74 79  Resp:  '17 16 20  ' Temp:  97.9 F (36.6 C) 98.1 F (36.7 C) 98.4 F (36.9 C)  TempSrc:   Oral Oral  SpO2:  93% 98%   Weight: 64.1 kg     Height:        Intake/Output Summary (Last 24 hours) at 08/27/2020 1308 Last data filed at 08/27/2020 1013 Gross per 24 hour  Intake 1339.69 ml  Output --  Net 1339.69 ml   Filed Weights   08/26/20 0458 08/27/20 0100  Weight: 61.6 kg 64.1 kg    Examination:  General exam: Somnolent lady, not following any command Respiratory system: Clear to auscultation. Respiratory effort normal. Cardiovascular system: S1 & S2 heard, RRR.  Gastrointestinal system: Soft, nontender, nondistended, bowel sounds positive. Central nervous system: Somnolent and not following any command. Extremities: No edema, no cyanosis, pulses intact and symmetrical. Psychiatry: Judgement and insight appear impaired.   DVT prophylaxis: Lovenox Code Status: DNR-admitting provider confirmed with mother Family Communication: Called mother twice with no response. Disposition Plan:  Status is: Inpatient  Remains inpatient appropriate because:Inpatient level of care appropriate due to severity of illness   Dispo: The patient is from: Home              Anticipated d/c is to: To be determined              Anticipated d/c date is: 3 days              Patient currently is not medically stable to d/c.   Difficult to place patient Yes               Level of care: Progressive Cardiac  Consultants:   Psychiatry  Nephrology  Neurology  Procedures:  Antimicrobials:  Ceftriaxone  Data Reviewed: I have personally reviewed following labs and imaging studies  CBC: Recent Labs  Lab 08/26/20 0508 08/27/20 0643  WBC 18.3* 15.3*  NEUTROABS 16.7*  --   HGB 14.9 11.8*  HCT 46.0 35.2*  MCV 94.7 92.6  PLT 475* 688   Basic Metabolic Panel: Recent Labs  Lab 08/26/20 0508 08/27/20 0643  NA 146* 145  K 4.8 5.4*  CL 112* 110  CO2 12* 17*  GLUCOSE 148* 75  BUN 43* 63*  CREATININE 3.53* 5.55*  CALCIUM 9.5 9.2   GFR: Estimated Creatinine Clearance: 10.6 mL/min (A) (by C-G formula based on SCr of 5.55 mg/dL (H)). Liver Function Tests: Recent Labs  Lab 08/26/20 0508  AST 46*  ALT 20  ALKPHOS 112  BILITOT 0.5  PROT 9.0*  ALBUMIN 4.5   No results for input(s): LIPASE, AMYLASE in the last 168 hours. No results for input(s): AMMONIA in the last 168 hours. Coagulation Profile: No results for input(s): INR, PROTIME in the last 168 hours. Cardiac Enzymes: Recent Labs  Lab 08/27/20 0911  CKTOTAL 364*   BNP (last 3 results) No results for input(s): PROBNP in the last 8760 hours. HbA1C: No results for  input(s): HGBA1C in the last 72 hours. CBG: No results for input(s): GLUCAP in the last 168 hours. Lipid Profile: No results for input(s): CHOL, HDL, LDLCALC, TRIG, CHOLHDL, LDLDIRECT in the last 72 hours. Thyroid Function Tests: No results for input(s): TSH, T4TOTAL, FREET4, T3FREE, THYROIDAB in the last 72 hours. Anemia Panel: No results for input(s): VITAMINB12, FOLATE, FERRITIN, TIBC, IRON, RETICCTPCT in the last 72 hours. Sepsis Labs: Recent Labs  Lab 08/26/20 0459 08/26/20 0508 08/26/20 0640 08/26/20 1030 08/26/20 1451  PROCALCITON  --  1.77  --   --   --   LATICACIDVEN 6.7*  --  5.3* 2.3* 2.8*    Recent Results (from the past 240 hour(s))  Resp Panel by RT-PCR (Flu A&B, Covid) Urine, Clean  Catch     Status: None   Collection Time: 08/26/20  5:08 AM   Specimen: Urine, Clean Catch; Nasopharyngeal(NP) swabs in vial transport medium  Result Value Ref Range Status   SARS Coronavirus 2 by RT PCR NEGATIVE NEGATIVE Final    Comment: (NOTE) SARS-CoV-2 target nucleic acids are NOT DETECTED.  The SARS-CoV-2 RNA is generally detectable in upper respiratory specimens during the acute phase of infection. The lowest concentration of SARS-CoV-2 viral copies this assay can detect is 138 copies/mL. A negative result does not preclude SARS-Cov-2 infection and should not be used as the sole basis for treatment or other patient management decisions. A negative result may occur with  improper specimen collection/handling, submission of specimen other than nasopharyngeal swab, presence of viral mutation(s) within the areas targeted by this assay, and inadequate number of viral copies(<138 copies/mL). A negative result must be combined with clinical observations, patient history, and epidemiological information. The expected result is Negative.  Fact Sheet for Patients:  EntrepreneurPulse.com.au  Fact Sheet for Healthcare Providers:  IncredibleEmployment.be  This test is no t yet approved or cleared by the [place] FDA and  has been authorized for detection and/or diagnosis of SARS-CoV-2 by FDA under an Emergency Use Authorization (EUA). This EUA will remain  in effect (meaning this test can be used) for the duration of the COVID-19 declaration under Section 564(b)(1) of the Act, 21 U.S.C.section 360bbb-3(b)(1), unless the authorization is terminated  or revoked sooner.       Influenza A by PCR NEGATIVE NEGATIVE Final   Influenza B by PCR NEGATIVE NEGATIVE Final    Comment: (NOTE) The Xpert Xpress SARS-CoV-2/FLU/RSV plus assay is intended as an aid in the diagnosis of influenza from Nasopharyngeal swab specimens and should not be used as a sole  basis for treatment. Nasal washings and aspirates are unacceptable for Xpert Xpress SARS-CoV-2/FLU/RSV testing.  Fact Sheet for Patients: EntrepreneurPulse.com.au  Fact Sheet for Healthcare Providers: IncredibleEmployment.be  This test is not yet approved or cleared by the [place] FDA and has been authorized for detection and/or diagnosis of SARS-CoV-2 by FDA under an Emergency Use Authorization (EUA). This EUA will remain in effect (meaning this test can be used) for the duration of the COVID-19 declaration under Section 564(b)(1) of the Act, 21 U.S.C. section 360bbb-3(b)(1), unless the authorization is terminated or revoked.  Performed at [facility], [address], [place], [place] [address]   Urine culture     Status: None   Collection Time: 08/26/20  5:54 AM   Specimen: Urine, Clean Catch  Result Value Ref Range Status   Specimen Description   Final    URINE, CLEAN CATCH Performed at [facility], [place], [place]  [address]    Special Requests   Final    NONE Performed at [facility], [address] [place], [place] [address]    Culture   Final    NO GROWTH Performed at [facility], [place] [address] [place], [place] [address]    Report Status 08/27/2020 FINAL  Final  Culture, blood (routine x 2)     Status: None (Preliminary result)   Collection Time: 08/26/20  6:03 AM   Specimen: Left Antecubital; Blood  Result Value Ref Range Status   Specimen Description LEFT ANTECUBITAL  Final   Special Requests   Final    BOTTLES DRAWN AEROBIC AND ANAEROBIC Blood Culture adequate volume   Culture   Final    NO GROWTH 1 DAY Performed at [facility], [address] [place], [place] [address]    Report Status PENDING  Incomplete  Culture, blood (routine x 2)     Status: None (Preliminary result)   Collection Time: 08/26/20  8:55 AM   Specimen: BLOOD LEFT FOREARM   Result Value Ref Range Status   Specimen Description BLOOD LEFT FOREARM  Final   Special Requests   Final    BOTTLES DRAWN AEROBIC AND ANAEROBIC Blood Culture adequate volume   Culture   Final    NO GROWTH 1 DAY Performed at [facility], [address] [place], [place] [address]    Report Status PENDING  Incomplete     Radiology Studies: CT Head Wo Contrast  Result Date: 08/26/2020 CLINICAL DATA:  Mental status change.  Unknown cause. EXAM: CT HEAD WITHOUT CONTRAST TECHNIQUE: Contiguous axial images were obtained from the base of the skull through the vertex without intravenous contrast. COMPARISON:  CT head 03/30/2009, MRI head 07/2010 FINDINGS: Brain: No evidence of large-territorial acute infarction. No parenchymal hemorrhage. No mass lesion. No extra-axial collection. No mass effect or midline shift. Redemonstration of a nonspecific tiny 2 mm hyperdensity within the foramen of [place] noted on axial imaging and not definitely visualized on coronal or sagittal view. Finding likely related to normal anatomy/vasculature versus could represent a tiny colloid. No hydrocephalus. Basilar cisterns are patent. Vascular: No hyperdense vessel. Atherosclerotic calcifications are present within the cavernous internal carotid arteries. Skull: No acute fracture or focal lesion. Sinuses/Orbits: Paranasal sinuses and mastoid air cells are clear. Bilateral lens replacement. Otherwise the orbits are unremarkable. Other: None. IMPRESSION: No acute intracranial abnormality. Electronically Signed   By: Iven Finn M.D.   On: 08/26/2020 06:07   DG Chest Port 1 View  Result Date: 08/26/2020 CLINICAL DATA:  Weakness.  Found unresponsive.  Short of breath. EXAM: PORTABLE CHEST 1 VIEW COMPARISON:  Chest x-ray 05/02/2013, CT chest 09/13/2007 FINDINGS: The heart size and mediastinal contours are within normal limits. Low lung volumes. No focal consolidation. No pulmonary edema. No pleural effusion. No  pneumothorax. No acute osseous abnormality.  Cervical surgical hardware noted. IMPRESSION: Low lung volumes with no acute cardiopulmonary disease. Electronically Signed   By: Iven Finn M.D.   On: 08/26/2020 05:19   CT RENAL STONE STUDY  Result Date: 08/26/2020 CLINICAL DATA:  Sepsis.  Renal failure. EXAM: CT ABDOMEN AND PELVIS WITHOUT CONTRAST TECHNIQUE: Multidetector CT imaging of the abdomen and pelvis was performed following the standard protocol without IV contrast. COMPARISON:  CT abdomen pelvis dated March 08, 2018. FINDINGS: Lower chest: No acute abnormality. Hepatobiliary: No focal liver abnormality. Layering sludge in the gallbladder. No gallbladder wall thickening or biliary dilatation. Pancreas: Unremarkable. No pancreatic  ductal dilatation or surrounding inflammatory changes. Spleen: Normal in size without focal abnormality. Adrenals/Urinary Tract: The adrenal glands are unremarkable. Unchanged small left renal simple cysts. No renal calculi or hydronephrosis. Small amount of layering hyperdensity in the posterior bladder (series 2, image 79). No bladder wall thickening. Stomach/Bowel: Stomach is within normal limits. Appendix appears normal. No evidence of bowel wall thickening, distention, or inflammatory changes. Vascular/Lymphatic: Aortic atherosclerosis. No enlarged abdominal or pelvic lymph nodes. Reproductive: Status post hysterectomy. No adnexal masses. Other: No abdominal wall hernia or abnormality. No abdominopelvic ascites. No pneumoperitoneum. Musculoskeletal: No acute or significant osseous findings. IMPRESSION: 1. Small amount of layering hyperdensity in the posterior bladder could reflect hemorrhage or tiny calculi. No obstructive uropathy. 2. Gallbladder sludge. 3. Aortic Atherosclerosis (ICD10-I70.0). Electronically Signed   By: Titus Dubin M.D.   On: 08/26/2020 09:25    Scheduled Meds: . carbidopa-levodopa  1 tablet Oral Daily  . carbidopa-levodopa  2 tablet Oral BID   . Carbidopa-Levodopa ER  1 tablet Oral TID  . Carbidopa-Levodopa ER  2 tablet Oral QHS  . enoxaparin (LOVENOX) injection  30 mg Subcutaneous Q24H  . Ipratropium-Albuterol  1 puff Inhalation Q6H  . lamoTRIgine  25 mg Oral BID  . LORazepam  1 mg Intravenous Q6H  . propranolol ER  60 mg Oral Daily  . sodium zirconium cyclosilicate  10 g Oral [id number]   Continuous Infusions: . [START ON 08/28/2020] cefTRIAXone (ROCEPHIN)  IV    . sodium bicarbonate (isotonic) 150 mEq in D5W 1000 mL infusion 100 mL/hr at 08/27/20 1233     LOS: 1 day   Time spent: 45 minutes.  Lorella Nimrod, MD [facility]  If 7PM-7AM, please contact night-coverage [email or web address]  08/27/2020, 1:08 PM   This record has been created using Systems analyst. Errors have been sought and corrected,but may not always be located. Such creation errors do not reflect on the standard of care.

## 2020-08-27 NOTE — Progress Notes (Signed)
Per pharmacy Carbidopa-Levadopa dosing is correct for the patient.

## 2020-08-27 NOTE — Consult Note (Signed)
NEUROLOGY CONSULTATION NOTE   Date of service: August 27, 2020 Patient Name: Stacy Pearson MRN:  [id number] DOB:  30-Aug-1960 Reason for consult: "concern for meningitis" _ _ _   _ __   _ __ _ _  __ __   _ __   __ _  History of Present Illness  GLYNIS HUNSUCKER is a 60 y.o. female with PMH significant for Asthma, Depression, COPD, Essential tremor, GERD, HTN, Parkinsonism, Bipolar disorder who presents with somnolence, confusion and difficult to arouse. She was found to become agitated. She is moving all extremitie sapontaneously, is awake, responds to every question with "Yes Ma'm", knows her name but is unable to provide any meaningful history. Most of the history obtained from chart review.   It appears that patient is a high functioning adult despite her medical issues and was last seen by her family at her baseline on 08/24/20. Since then has been more somnolent, confused and family eventually called EMS. I attempted to contact her mother to get some history but her phone went to voice mail on 2 separate attempts.  Workup revealed leukocytosis with a WBC count of 18 on presentation, lactic acidosis, elevated troponins with elevated creatinine to 3 and 5 today. CTH was negative for an acute infarct. CXR with no clear pneumonia, Urine analysis with no concerns for a UTI.  Antibiotics were stopped but she continues to be encephalopathic and her lactate uptrended to 3 today.   ROS   Unable to obtain due to encephalopathy.  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[place])    GERD  (gastroesophageal reflux disease)    Headache    History of basal cell carcinoma    Hypertension    Neuropathy    Parkinson's disease ([place])    Parkinson's disease ([place])    Parkinsonism ([place])    Past Surgical History:  Procedure Laterality Date   ABDOMINAL HYSTERECTOMY     ADENOIDECTOMY     BLADDER SURGERY     bone grafts     CATARACT EXTRACTION W/ INTRAOCULAR LENS IMPLANT     CERVICAL FUSION     COLONOSCOPY     COLONOSCOPY WITH ESOPHAGOGASTRODUODENOSCOPY (EGD)     COLONOSCOPY WITH PROPOFOL N/A 04/28/2018   Procedure: COLONOSCOPY WITH PROPOFOL;  Surgeon: Manya Silvas, MD;  Location: [facility] ENDOSCOPY;  Service: Endoscopy;  Laterality: N/A;   EYE SURGERY     GRAFT APPLICATION     in neck   LAPAROSCOPIC BILATERAL SALPINGO OOPHERECTOMY     NEUROPLASTY / TRANSPOSITION ULNAR NERVE AT ELBOW     OOPHORECTOMY     SKIN CANCER EXCISION     skin grafts   left forearm; hand bil.lower extremeties   titanium plate     TONSILLECTOMY     TUBAL LIGATION     Family History  Problem Relation Age of Onset   Breast cancer Mother    Lung cancer Father    Social History   Socioeconomic History  Marital status: Single    Spouse name: Not on file   Number of children: Not on file   Years of education: Not on file   Highest education level: Not on file  Occupational History   Not on file  Tobacco Use   Smoking status: Former Smoker    Types: Cigarettes    Quit date: 07/14/2006    Years since quitting: 14.1   Smokeless tobacco: Never Used  Substance and Sexual Activity   Alcohol use: Never   Drug use: Never   Sexual activity: Not on file  Other Topics Concern   Not on file  Social History Narrative   Not on file   Social Determinants of Health   Financial Resource Strain: Not on file  Food Insecurity: Not on file  Transportation Needs: Not on file  Physical Activity: Not on file  Stress: Not on file  Social Connections: Not on file    Allergies  Allergen Reactions   Codeine Nausea And Vomiting   Prednisone     Medications   Medications Prior to Admission  Medication Sig Dispense Refill Last Dose   Carbidopa-Levodopa ER (SINEMET CR) 25-100 MG tablet controlled release Take 1-2 tablets by mouth 4 (four) times daily. Take one tablet by mouth at 7 am, one tablet at 12 pm, one tablet between 5-6 pm and two tablets at bedtime   08/24/2020   amitriptyline (ELAVIL) 50 MG tablet Take 1-2 tablets by mouth at bedtime.   08/24/2020   carbidopa-levodopa (SINEMET IR) 25-100 MG tablet Take 1-2 tablets by mouth 3 (three) times daily. Take two tablets by mouth at 7 am, two tablets at 12 pm and one tablet at 5 pm   08/24/2020   Cariprazine HCl 6 MG CAPS Take 6 mg by mouth daily at 12 noon.   08/24/2020   diazepam (VALIUM) 10 MG tablet Take 10 mg by mouth 3 (three) times daily as needed.   08/24/2020   DULoxetine (CYMBALTA) 60 MG capsule Take by mouth.      EPINEPHrine 0.3 mg/0.3 mL IJ SOAJ injection Inject into the muscle once.   prn at prn   Ipratropium-Albuterol (COMBIVENT) 20-100 MCG/ACT AERS respimat Inhale 1 puff into the lungs every 6 (six) hours.   prn at prn   lamoTRIgine (LAMICTAL) 25 MG tablet Take 50 mg by mouth at bedtime.   08/24/2020   ondansetron (ZOFRAN) 8 MG tablet Take by mouth every 8 (eight) hours as needed for nausea or vomiting.   unknown at prn   propranolol ER (INDERAL LA) 60 MG 24 hr capsule Take 60 mg by mouth daily.   08/24/2020     Vitals   Vitals:   08/27/20 0100 08/27/20 0421 08/27/20 0820 08/27/20 1139  BP:  133/85 (!) 145/74 (!) 142/80  Pulse:  75 74 79  Resp:  17 16 20   Temp:  97.9 F (36.6 C) 98.1 F (36.7 C) 98.4 F (36.9 C)  TempSrc:   Oral Oral  SpO2:  93% 98%   Weight: 64.1 kg     Height:         Body mass index is 22.15 kg/m.  Physical Exam   General: Laying comfortably in bed; in no acute distress. HENT: Normal oropharynx and mucosa. Normal external appearance of ears  and nose. Neck: Supple, no pain or tenderness CV: No JVD. No peripheral edema. Pulmonary: Symmetric Chest rise. Normal respiratory effort. Abdomen: Soft to touch, non-tender. Ext: No cyanosis, edema, or deformity Skin: No rash.  Normal palpation of skin.  Musculoskeletal: Normal digits and nails by inspection. No clubbing.  Neurologic Examination  Mental status/Cognition:Alert, oriented to self only, responds with yes mam to any questions asked. Very poor attention, does not follow any commands.  Speech/language: poor attention makes it difficult to assess language accurately. Does not follow commands, does not name objects or repeat. No dysarthria. Cranial nerves:   CN II Pupils equal and reactive to light, blinks to threat BL   CN III,IV,VI EOM intact to dolls eyes, no gaze preference or deviation, no nystagmus   CN V    CN VII Symmetric facial grimace to pain.   CN VIII Turns towards speech.   CN IX & X    CN XI    CN XII    Motor:  Muscle bulk: normal, tone increased mildly in all extremitie but no cog wheeling.  Attempting to sit up in the bed. Moves all extremities spontaneously and antigravity and holds them up in the air, does not move to command.  Reflexes:  Right Left Comments  Pectoralis      Biceps (C5/6) 2+ 2+   Brachioradialis (C5/6) 2+ 2+    Triceps (C6/7) 2+ 2+    Patellar (L3/4) 2+ 2+    Achilles (S1) 2 2    Hoffman      Plantar     Jaw jerk    Sensation:  Light touch Grimace to pain in all extremities.   Pin prick    Temperature    Vibration   Proprioception    Coordination/Complex Motor:  Unable to assess.  Labs   CBC:  Recent Labs  Lab 08/26/20 0508 08/27/20 0643  WBC 18.3* 15.3*  NEUTROABS 16.7*  --   HGB 14.9 11.8*  HCT 46.0 35.2*  MCV 94.7 92.6  PLT 475* 025    Basic Metabolic Panel:  Lab Results  Component Value Date   NA 145 08/27/2020   K 5.4 (H) 08/27/2020   CO2 17 (L) 08/27/2020   GLUCOSE 75 08/27/2020   BUN 63 (H)  08/27/2020   CREATININE 5.55 (H) 08/27/2020   CALCIUM 9.2 08/27/2020   GFRNONAA 8 (L) 08/27/2020   GFRAA >60 03/06/2013   Lipid Panel: No results found for: LDLCALC HgbA1c: No results found for: HGBA1C Urine Drug Screen:     Component Value Date/Time   LABOPIA NEGATIVE 03/06/2013 2254   COCAINSCRNUR NEGATIVE 03/06/2013 2254   LABBENZ POSITIVE 03/06/2013 2254   AMPHETMU NEGATIVE 03/06/2013 2254   THCU NEGATIVE 03/06/2013 2254   LABBARB NEGATIVE 03/06/2013 2254    Alcohol Level No results found for: ETH  CT Head without contrast: CTH was negative for a large hypodensity concerning for a large territory infarct or hyperdensity concerning for an ICH  rEEG: pending  Impression   GERALINE HALBERSTADT is a 60 y.o. female with PMH significant for Asthma, Depression, COPD, Essential tremor, GERD, HTN, Parkinsonism, Bipolar disorder who presents with confusion, lethargy, followed by agitation. Encephalopathic on exam with no focal deficit. Labs with leukocytosis, elevated lactate, Procalcitonin and significant AKI.   Given her presentation, specifically with sepsis with unknown/unclear source, recommend CNS coverage with Vanc, Cefepime and Acyclovir. I do think that part of her presentation is also due to polypharmacy. Specially with the AKI and reduced clearance of multiple medications.  Her UDS is positive for Tricyclics, Benzos, with negative Salcylate, Acetaminophen and Ethanol levels. She does hav a benzo and tricyclic prescription.  Recommendations  - Recommend Broad Spectrum Antibiotic coverage with  Vanc, Cefepime, Acyclovir - Recommend consult to IR for LP under fluoro with CSF cell count, differential, protein, glucose, gram stain, cultures, CSF lactate. - I ordered rEEG. - I ordered serum Ammonia, Vit B12, folate, TSH, thiamine levels with high dose Thiamine replacement. - Will need to get MRI Brain without contrast after  LP ______________________________________________________________________   Thank you for the opportunity to take part in the care of this patient. If you have any further questions, please contact the neurology consultation attending.  Signed,  [place] [facility] Pager Number [phone] _ _ _   _ __   _ __ _ _  __ __   _ __   __ _

## 2020-08-27 NOTE — Progress Notes (Addendum)
Pharmacy Antibiotic Note  Stacy Pearson is a 60 y.o. female admitted on 08/26/2020 with sepsis with unclear origin of infection. Patient found by mother with decreased level of consciousness and labored breathing.   Pharmacy has been consulted for vancomycin and cefepime dosing. Her renal function is noted to be significantly impaired compared to her last baseline level. She is scheduled to receive 1500 mg IV vancomycin as a loading dose in the ED  Today, 08/27/2020 Day #2 vancomycin, cefepime  Renal: Scr worsening  WBC improved  Afebrile  No Urine Drug screen available  CXR and CT head unrevealing for acute infectious process  Urine Culture: No growth  Blood Culture: pending   Plan: Per Worsening renal function  1) Reduce cefepime to 1 gram IV every 24 hours  2) Stop vancomycin and check random level 2/15 with morning labs  F/u ability to de-escalate antibiotics  (? narrow cefepime to ceftriaxone, f/u ability to stop vancomycin )  Addendum: vancomycin has been stopped by Hospitalist  Height: 5\' 7"  (170.2 cm) Weight: 64.1 kg (141 lb 6.4 oz) IBW/kg (Calculated) : 61.6  Temp (24hrs), Avg:98.7 F (37.1 C), Min:97.9 F (36.6 C), Max:99.7 F (37.6 C)  Recent Labs  Lab 08/26/20 0459 08/26/20 0508 08/26/20 0640 08/26/20 1030 08/26/20 1451 08/27/20 0643  WBC  --  18.3*  --   --   --  15.3*  CREATININE  --  3.53*  --   --   --  5.55*  LATICACIDVEN 6.7*  --  5.3* 2.3* 2.8*  --     Estimated Creatinine Clearance: 10.6 mL/min (A) (by C-G formula based on SCr of 5.55 mg/dL (H)).    Allergies  Allergen Reactions  . Codeine Nausea And Vomiting  . Prednisone     Antimicrobials this admission: vancomycin 2/13 >>  cefepime 2/13 >>   Microbiology results: 2/13 BCx: pending 2/13 UCx: NG 2/13 SARS CoV-2: negative 2/13 influenza A/B: negaive  Thank you for allowing pharmacy to be a part of this patient's care.  Doreene Eland, PharmD, BCPS.   Work Cell:  [phone] 08/27/2020 8:48 AM

## 2020-08-27 NOTE — Consult Note (Signed)
[facility]  CONSULT NOTE    Date: 08/27/2020                  Patient Name:  Stacy Pearson  MRN: [id number]  DOB: 1960-10-08  Age / Sex: 60 y.o., female         PCP: Valera Castle, MD                 Service Requesting Consult: Dr. Reesa Chew                 Reason for Consult: Acute kidney injury            History of Present Illness: [name] admitted to [facility] for altered mental status. Patient is unable to give a history. Patient was found after missing for two days by her mother. Patient was confused and agitated. Patient found to have acute kidney injury and nephrology was consulted. Lactated Ringers IV fluids. CT with no obstruction. No IV contrast exposure. Baseline creatinine of 0.8 with normal GFR on 01/31/2020.     Medications: Outpatient medications: Medications Prior to Admission  Medication Sig Dispense Refill Last Dose  . Carbidopa-Levodopa ER (SINEMET CR) 25-100 MG tablet controlled release Take 1-2 tablets by mouth 4 (four) times daily. Take one tablet by mouth at 7 am, one tablet at 12 pm, one tablet between 5-6 pm and two tablets at bedtime   08/24/2020  . amitriptyline (ELAVIL) 50 MG tablet Take 1-2 tablets by mouth at bedtime.   08/24/2020  . carbidopa-levodopa (SINEMET IR) 25-100 MG tablet Take 1-2 tablets by mouth 3 (three) times daily. Take two tablets by mouth at 7 am, two tablets at 12 pm and one tablet at 5 pm   08/24/2020  . Cariprazine HCl 6 MG CAPS Take 6 mg by mouth daily at 12 noon.   08/24/2020  . diazepam (VALIUM) 10 MG tablet Take 10 mg by mouth 3 (three) times daily as needed.   08/24/2020  . DULoxetine (CYMBALTA) 60 MG capsule Take by mouth.     . EPINEPHrine 0.3 mg/0.3 mL IJ SOAJ injection Inject into the muscle once.   prn at prn  . Ipratropium-Albuterol (COMBIVENT) 20-100 MCG/ACT AERS respimat Inhale 1 puff into the lungs every 6 (six) hours.   prn at prn  . lamoTRIgine (LAMICTAL) 25 MG tablet Take 50 mg by  mouth at bedtime.   08/24/2020  . ondansetron (ZOFRAN) 8 MG tablet Take by mouth every 8 (eight) hours as needed for nausea or vomiting.   unknown at prn  . propranolol ER (INDERAL LA) 60 MG 24 hr capsule Take 60 mg by mouth daily.   08/24/2020    Current medications: Current Facility-Administered Medications  Medication Dose Route Frequency Provider Last Rate Last Admin  . acetaminophen (TYLENOL) tablet 650 mg  650 mg Oral Q6H PRN Ivor Costa, MD       Or  . acetaminophen (TYLENOL) suppository 650 mg  650 mg Rectal Q6H PRN Ivor Costa, MD      . albuterol (PROVENTIL) (2.5 MG/3ML) 0.083% nebulizer solution 2.5 mg  2.5 mg Nebulization Q4H PRN Ivor Costa, MD      . carbidopa-levodopa (SINEMET IR) 25-100 MG per tablet immediate release 1 tablet  1 tablet Oral Daily Lorella Nimrod, MD      . carbidopa-levodopa (SINEMET IR) 25-100 MG per tablet immediate release 2 tablet  2 tablet Oral BID Lorella Nimrod, MD   2 tablet at 08/27/20  1417  . Carbidopa-Levodopa ER (SINEMET CR) 25-100 MG tablet controlled release 1 tablet  1 tablet Oral TID Oswald Hillock, RPH   1 tablet at 08/27/20 1229  . Carbidopa-Levodopa ER (SINEMET CR) 25-100 MG tablet controlled release 2 tablet  2 tablet Oral QHS Oswald Hillock, RPH      . [START ON 08/28/2020] cefTRIAXone (ROCEPHIN) 2 g in sodium chloride 0.9 % 100 mL IVPB  2 g Intravenous Q24H Amin, Soundra Pilon, MD      . enoxaparin (LOVENOX) injection 30 mg  30 mg Subcutaneous Q24H Dallie Piles, RPH   30 mg at 08/26/20 2234  . Ipratropium-Albuterol (COMBIVENT) respimat 1 puff  1 puff Inhalation Q6H Ivor Costa, MD      . lamoTRIgine (LAMICTAL) tablet 25 mg  25 mg Oral BID Ivor Costa, MD   25 mg at 08/27/20 0959  . LORazepam (ATIVAN) injection 0.5 mg  0.5 mg Intravenous Q4H PRN Ivor Costa, MD      . LORazepam (ATIVAN) injection 1 mg  1 mg Intravenous Q6H Clapacs, Madie Reno, MD   1 mg at 08/27/20 0957  . LORazepam (ATIVAN) injection 2 mg  2 mg Intravenous Q2H PRN Ivor Costa, MD      .  [facility]) injection 4 mg  4 mg Intravenous Q8H PRN Ivor Costa, MD      . propranolol ER (INDERAL LA) 24 hr capsule 60 mg  60 mg Oral Daily Ivor Costa, MD   60 mg at 08/27/20 0958  . sodium bicarbonate 150 mEq in dextrose 5 % 1,000 mL infusion   Intravenous Continuous Petrice Beedy, MD 100 mL/hr at 08/27/20 1233 New Bag at 08/27/20 1233  . sodium zirconium cyclosilicate (LOKELMA) packet 10 g  10 g Oral Q1400 Lorella Nimrod, MD   10 g at 08/27/20 1417      Allergies: Allergies  Allergen Reactions  . Codeine Nausea And Vomiting  . Prednisone       Past Medical History: Past Medical History:  Diagnosis Date  . Abdominal pain   . Allergic genetic state   . Anxiety   . Asthma   . Basal cell carcinoma   . Bipolar disorder ([place])   . Cancer ([place])    MOS surgery basal cell face  . COPD (chronic obstructive pulmonary disease) ([place])   . Depression   . Eating disorder   . Eczema   . Encounter for blood transfusion   . Essential tremor   . 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 GERD (gastroesophageal reflux disease)   . Headache   . History of basal cell carcinoma   . Hypertension   . Neuropathy   . Parkinson's disease ([place])   . Parkinson's disease ([place])   . Parkinsonism [facility])      Past Surgical History: Past Surgical History:  Procedure Laterality Date  . ABDOMINAL HYSTERECTOMY    . ADENOIDECTOMY    . BLADDER SURGERY    . bone grafts    . CATARACT EXTRACTION W/ INTRAOCULAR LENS IMPLANT    . CERVICAL FUSION    . COLONOSCOPY    . COLONOSCOPY WITH ESOPHAGOGASTRODUODENOSCOPY (EGD)    . COLONOSCOPY WITH PROPOFOL N/A 04/28/2018   Procedure: COLONOSCOPY WITH PROPOFOL;  Surgeon: Manya Silvas, MD;  Location: [facility] ENDOSCOPY;  Service: Endoscopy;  Laterality: N/A;  . EYE SURGERY    . GRAFT APPLICATION     in neck  . LAPAROSCOPIC BILATERAL SALPINGO OOPHERECTOMY    . NEUROPLASTY /  TRANSPOSITION ULNAR NERVE AT ELBOW    . OOPHORECTOMY     . SKIN CANCER EXCISION    . skin grafts   left forearm; hand bil.lower extremeties  . titanium plate    . TONSILLECTOMY    . TUBAL LIGATION       Family History: Family History  Problem Relation Age of Onset  . Breast cancer Mother   . Lung cancer Father      Social History: Social History   Socioeconomic History  . Marital status: Single    Spouse name: Not on file  . Number of children: Not on file  . Years of education: Not on file  . Highest education level: Not on file  Occupational History  . Not on file  Tobacco Use  . Smoking status: Former Smoker    Types: Cigarettes    Quit date: 07/14/2006    Years since quitting: 14.1  . Smokeless tobacco: Never Used  Substance and Sexual Activity  . Alcohol use: Never  . Drug use: Never  . Sexual activity: Not on file  Other Topics Concern  . Not on file  Social History Narrative  . Not on file   Social Determinants of Health   Financial Resource Strain: Not on file  Food Insecurity: Not on file  Transportation Needs: Not on file  Physical Activity: Not on file  Stress: Not on file  Social Connections: Not on file  Intimate Partner Violence: Not on file     Review of Systems: Review of Systems  Unable to perform ROS: Mental status change    Vital Signs: Blood pressure (!) 142/80, pulse 79, temperature 98.4 F (36.9 C), temperature source Oral, resp. rate 20, height 5\' 7"  (1.702 m), weight 64.1 kg, SpO2 98 %.  Weight trends: Filed Weights   08/26/20 0458 08/27/20 0100  Weight: 61.6 kg 64.1 kg    Physical Exam: General: Ill appearing  Head: Dry oral mucosal membranes  Eyes: Anicteric, PERRL  Neck: Supple, trachea midline  Lungs:  Clear to auscultation  Heart: Regular rate and rhythm  Abdomen:  Soft, nontender,   Extremities:  no peripheral edema.  Neurologic: Not following commands  Skin: No lesions  Access: none     Lab results: Basic Metabolic Panel: Recent Labs  Lab 08/26/20 0508  08/27/20 0643  NA 146* 145  K 4.8 5.4*  CL 112* 110  CO2 12* 17*  GLUCOSE 148* 75  BUN 43* 63*  CREATININE 3.53* 5.55*  CALCIUM 9.5 9.2    Liver Function Tests: Recent Labs  Lab 08/26/20 0508  AST 46*  ALT 20  ALKPHOS 112  BILITOT 0.5  PROT 9.0*  ALBUMIN 4.5   No results for input(s): LIPASE, AMYLASE in the last 168 hours. No results for input(s): AMMONIA in the last 168 hours.  CBC: Recent Labs  Lab 08/26/20 0508 08/27/20 0643  WBC 18.3* 15.3*  NEUTROABS 16.7*  --   HGB 14.9 11.8*  HCT 46.0 35.2*  MCV 94.7 92.6  PLT 475* 281    Cardiac Enzymes: Recent Labs  Lab 08/27/20 0911  CKTOTAL 364*    BNP: Invalid input(s): POCBNP  CBG: No results for input(s): GLUCAP in the last 168 hours.  Microbiology: Results for orders placed or performed during the hospital encounter of 08/26/20  Resp Panel by RT-PCR (Flu A&B, Covid) Urine, Clean Catch     Status: None   Collection Time: 08/26/20  5:08 AM   Specimen: Urine, Clean Catch; Nasopharyngeal(NP) swabs  in vial transport medium  Result Value Ref Range Status   SARS Coronavirus 2 by RT PCR NEGATIVE NEGATIVE Final    Comment: (NOTE) SARS-CoV-2 target nucleic acids are NOT DETECTED.  The SARS-CoV-2 RNA is generally detectable in upper respiratory specimens during the acute phase of infection. The lowest concentration of SARS-CoV-2 viral copies this assay can detect is 138 copies/mL. A negative result does not preclude SARS-Cov-2 infection and should not be used as the sole basis for treatment or other patient management decisions. A negative result may occur with  improper specimen collection/handling, submission of specimen other than nasopharyngeal swab, presence of viral mutation(s) within the areas targeted by this assay, and inadequate number of viral copies(<138 copies/mL). A negative result must be combined with clinical observations, patient history, and epidemiological information. The expected  result is Negative.  Fact Sheet for Patients:  EntrepreneurPulse.com.au  Fact Sheet for Healthcare Providers:  IncredibleEmployment.be  This test is no t yet approved or cleared by the [place] FDA and  has been authorized for detection and/or diagnosis of SARS-CoV-2 by FDA under an Emergency Use Authorization (EUA). This EUA will remain  in effect (meaning this test can be used) for the duration of the COVID-19 declaration under Section 564(b)(1) of the Act, 21 U.S.C.section 360bbb-3(b)(1), unless the authorization is terminated  or revoked sooner.       Influenza A by PCR NEGATIVE NEGATIVE Final   Influenza B by PCR NEGATIVE NEGATIVE Final    Comment: (NOTE) The Xpert Xpress SARS-CoV-2/FLU/RSV plus assay is intended as an aid in the diagnosis of influenza from Nasopharyngeal swab specimens and should not be used as a sole basis for treatment. Nasal washings and aspirates are unacceptable for Xpert Xpress SARS-CoV-2/FLU/RSV testing.  Fact Sheet for Patients: EntrepreneurPulse.com.au  Fact Sheet for Healthcare Providers: IncredibleEmployment.be  This test is not yet approved or cleared by the [place] FDA and has been authorized for detection and/or diagnosis of SARS-CoV-2 by FDA under an Emergency Use Authorization (EUA). This EUA will remain in effect (meaning this test can be used) for the duration of the COVID-19 declaration under Section 564(b)(1) of the Act, 21 U.S.C. section 360bbb-3(b)(1), unless the authorization is terminated or revoked.  Performed at [facility], [address] [place], [place] [address]   Urine culture     Status: None   Collection Time: 08/26/20  5:54 AM   Specimen: Urine, Clean Catch  Result Value Ref Range Status   Specimen Description   Final    URINE, CLEAN CATCH Performed at [facility], [address] [place], [place] [address]     Special Requests   Final    NONE Performed at [facility], [address] [place], [place] [address]    Culture   Final    NO GROWTH Performed at [facility], [place] [address], [place], [place] [address]    Report Status 08/27/2020 FINAL  Final  Culture, blood (routine x 2)     Status: None (Preliminary result)   Collection Time: 08/26/20  6:03 AM   Specimen: Left Antecubital; Blood  Result Value Ref Range Status   Specimen Description LEFT ANTECUBITAL  Final   Special Requests   Final    BOTTLES DRAWN AEROBIC AND ANAEROBIC Blood Culture adequate volume   Culture   Final    NO GROWTH 1 DAY Performed at [facility], [address], [place], [place] [address]    Report Status PENDING  Incomplete  Culture, blood (routine x 2)     Status: None (Preliminary result)   Collection Time: 08/26/20  8:55 AM   Specimen: BLOOD LEFT FOREARM  Result Value Ref Range Status   Specimen Description BLOOD LEFT FOREARM  Final   Special Requests   Final    BOTTLES DRAWN AEROBIC AND ANAEROBIC Blood Culture adequate volume   Culture   Final    NO GROWTH 1 DAY Performed at [facility], [address] [place], [place] [address]    Report Status PENDING  Incomplete    Coagulation Studies: No results for input(s): LABPROT, INR in the last 72 hours.  Urinalysis: Recent Labs    08/26/20 0508  COLORURINE YELLOW*  LABSPEC 1.009  PHURINE 5.0  GLUCOSEU 50*  HGBUR LARGE*  BILIRUBINUR NEGATIVE  KETONESUR NEGATIVE  PROTEINUR 100*  NITRITE NEGATIVE  LEUKOCYTESUR NEGATIVE      Imaging: CT Head Wo Contrast  Result Date: 08/26/2020 CLINICAL DATA:  Mental status change.  Unknown cause. EXAM: CT HEAD WITHOUT CONTRAST TECHNIQUE: Contiguous axial images were obtained from the base of the skull through the vertex without intravenous contrast. COMPARISON:  CT head 03/30/2009, MRI head 07/2010 FINDINGS: Brain: No evidence of large-territorial acute infarction. No  parenchymal hemorrhage. No mass lesion. No extra-axial collection. No mass effect or midline shift. Redemonstration of a nonspecific tiny 2 mm hyperdensity within the foramen of [place] noted on axial imaging and not definitely visualized on coronal or sagittal view. Finding likely related to normal anatomy/vasculature versus could represent a tiny colloid. No hydrocephalus. Basilar cisterns are patent. Vascular: No hyperdense vessel. Atherosclerotic calcifications are present within the cavernous internal carotid arteries. Skull: No acute fracture or focal lesion. Sinuses/Orbits: Paranasal sinuses and mastoid air cells are clear. Bilateral lens replacement. Otherwise the orbits are unremarkable. Other: None. IMPRESSION: No acute intracranial abnormality. Electronically Signed   By: Iven Finn M.D.   On: 08/26/2020 06:07   DG Chest Port 1 View  Result Date: 08/26/2020 CLINICAL DATA:  Weakness.  Found unresponsive.  Short of breath. EXAM: PORTABLE CHEST 1 VIEW COMPARISON:  Chest x-ray 05/02/2013, CT chest 09/13/2007 FINDINGS: The heart size and mediastinal contours are within normal limits. Low lung volumes. No focal consolidation. No pulmonary edema. No pleural effusion. No pneumothorax. No acute osseous abnormality.  Cervical surgical hardware noted. IMPRESSION: Low lung volumes with no acute cardiopulmonary disease. Electronically Signed   By: Iven Finn M.D.   On: 08/26/2020 05:19   CT RENAL STONE STUDY  Result Date: 08/26/2020 CLINICAL DATA:  Sepsis.  Renal failure. EXAM: CT ABDOMEN AND PELVIS WITHOUT CONTRAST TECHNIQUE: Multidetector CT imaging of the abdomen and pelvis was performed following the standard protocol without IV contrast. COMPARISON:  CT abdomen pelvis dated March 08, 2018. FINDINGS: Lower chest: No acute abnormality. Hepatobiliary: No focal liver abnormality. Layering sludge in the gallbladder. No gallbladder wall thickening or biliary dilatation. Pancreas: Unremarkable. No  pancreatic ductal dilatation or surrounding inflammatory changes. Spleen: Normal in size without focal abnormality. Adrenals/Urinary Tract: The adrenal glands are unremarkable. Unchanged small left renal simple cysts. No renal calculi or hydronephrosis. Small amount of layering hyperdensity in the posterior bladder (series 2, image 79). No bladder wall thickening. Stomach/Bowel: Stomach is within normal limits. Appendix appears normal. No evidence of bowel wall thickening, distention, or inflammatory changes. Vascular/Lymphatic: Aortic atherosclerosis. No enlarged abdominal or pelvic lymph nodes. Reproductive: Status post hysterectomy. No adnexal masses. Other: No abdominal wall hernia or abnormality. No abdominopelvic ascites. No pneumoperitoneum. Musculoskeletal:  No acute or significant osseous findings. IMPRESSION: 1. Small amount of layering hyperdensity in the posterior bladder could reflect hemorrhage or tiny calculi. No obstructive uropathy. 2. Gallbladder sludge. 3. Aortic Atherosclerosis (ICD10-I70.0). Electronically Signed   By: Titus Dubin M.D.   On: 08/26/2020 09:25      Assessment & Plan: Ms. LUCI BELLUCCI is a 60 y.o. white female with Bipolar disorder, Parkinson, GERD, history of seizure disorder, COPD, who was admitted to [facility] on 08/26/2020 for Confusion [R41.0] Severe sepsis with septic shock ([place]) [A41.9, R65.21] Severe sepsis ([place]) [A41.9, R65.20] Altered mental status, unspecified altered mental status type [R41.82] Sepsis, due to unspecified organism, unspecified whether acute organ dysfunction present ([place]) [A41.9]  1. Acute kidney injury  2. Hyperkalemia 3. Hypernatremia - with history of hyponatremia 4. Metabolic acidosis 5. Proteinuria  6. Hematuria  Concerning for prerenal azotemia.  - IV fluids: sodium bicarbonate infusion - Lokelma - empiric antibiotics.  - Low threshold for renal replacement therapy - hold cariprazine due to renal insufficiency.   LOS:  1 Carrah Eppolito 2/14/20223:00 PM

## 2020-08-27 NOTE — Progress Notes (Signed)
Attempted to give 7am scheduled medication but patient uncooperative at this time and just spits out the pills.

## 2020-08-27 NOTE — Progress Notes (Signed)
Pharmacy Antibiotic Note  Stacy Pearson is a 60 y.o. female admitted on 08/26/2020 with sepsis with unclear origin of infection. Patient found by mother with decreased level of consciousness and labored breathing.   Pharmacy has been consulted for vancomycin and cefepime dosing. Her renal function is noted to be significantly impaired compared to her last baseline level. She is scheduled to receive 1500 mg IV vancomycin as a loading dose in the ED  Today, 08/27/2020 Day #2 antibiotics  Antibiotics were adjusted to ceftriaxone monotherapy this morning but patient seen by neurology and rec broadening coverage for meningitis.  The recommendation is for vancomycin, cefepime and acyclovir    Renal: Scr worsening  WBC improved  Afebrile  CK 364  Urine Drug screen - detects TCA and benzos  CXR and CT head unrevealing for acute infectious process  Urine Culture: No growth  Blood Culture: pending   Vancomycin 1500mg  IV x 1 given 2/13 at 0900  Plan:  Check vancomycin trough prior to redosing vancomycin, redose if trough < 20 mcg/ml  Cefepime 1gm IV q24h for CrCl < 11 ml/min (next dose due 2/15 am as givne 2gm 2/14 at 0600)  Acyclovir 10mg /kg q24h per current renal function,  Change to 5mg /kg if renal function worsens  Follow renal function closely  Await LP  Monitor for seizures on cefepime and acyclovir with poor renal fx and PMH of seizures  Height: 5\' 7"  (170.2 cm) Weight: 64.1 kg (141 lb 6.4 oz) IBW/kg (Calculated) : 61.6  Temp (24hrs), Avg:98.6 F (37 C), Min:97.9 F (36.6 C), Max:99.7 F (37.6 C)  Recent Labs  Lab 08/26/20 0508 08/26/20 0640 08/26/20 1030 08/26/20 1451 08/27/20 0643 08/27/20 1235 08/27/20 1510  WBC 18.3*  --   --   --  15.3*  --   --   CREATININE 3.53*  --   --   --  5.55*  --   --   LATICACIDVEN  --  5.3* 2.3* 2.8*  --  3.0* 2.1*    Estimated Creatinine Clearance: 10.6 mL/min (A) (by C-G formula based on SCr of 5.55 mg/dL (H)).     Allergies  Allergen Reactions  . Codeine Nausea And Vomiting  . Prednisone     Antimicrobials this admission: vancomycin 2/13 >>  cefepime 2/13 >>  Acyclovir 2/13 >>  Microbiology results: 2/13 BCx: NGTD 2/13 UCx: NG 2/13 SARS CoV-2: negative 2/13 influenza A/B: negaive  Thank you for allowing pharmacy to be a part of this patient's care.  Doreene Eland, PharmD, BCPS.   Work Cell: [phone] 08/27/2020 4:12 PM

## 2020-08-27 NOTE — Consult Note (Signed)
[facility] Consult   Reason for Consult: Follow-up consult 60 year old woman with acute mental status change Referring Physician: Reesa Chew Patient Identification: Stacy Pearson MRN:  [id number] Principal Diagnosis: Delirium Diagnosis:  Principal Problem:   Delirium Active Problems:   Bipolar 1 disorder ([place])   COPD (chronic obstructive pulmonary disease) (HCC)   Essential tremor   Migraine without aura   Parkinson's disease ([place])   Epilepsy ([place])   Depression with anxiety   HTN (hypertension)   Acute metabolic encephalopathy   UTI (urinary tract infection)   AKI (acute kidney injury) ([place])   Severe sepsis with septic shock ([place])   Total Time spent with patient: 30 minutes  Subjective:   Stacy Pearson is a 60 y.o. female patient admitted with patient nonverbal.  HPI:   Patient seen chart reviewed.  Patient remains nonverbal.  I found her in the fetal position in bed seemed to be breathing comfortably not diaphoretic did not appear to be in evident pain.  Limbs were not stiff and were easily moved.  When I said the name of the patient loudly her eyes would flicker but she would not vocalize.  Full evaluation continues including spinal tap stuff etc.  So far no diagnostic findings.  Past Psychiatric History: History of substance use and bipolar disorder and multiple psychiatric illnesses.  Relatively newer diagnosis of Parkinson's disease  Risk to Self:   Risk to Others:   Prior Inpatient Therapy:   Prior Outpatient Therapy:    Past Medical History:  Past Medical History:  Diagnosis Date  . Abdominal pain   . Allergic genetic state   . Anxiety   . Asthma   . Basal cell carcinoma   . Bipolar disorder ([place])   . Cancer ([place])    MOS surgery basal cell face  . COPD (chronic obstructive pulmonary disease) ([place])   . Depression   . Eating disorder   . Eczema   . Encounter for blood transfusion   . Essential tremor   . 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 GERD (gastroesophageal reflux disease)   . Headache   . History of basal cell carcinoma   . Hypertension   . Neuropathy   . Parkinson's disease ([place])   . Parkinson's disease ([place])   . Parkinsonism [facility])     Past Surgical History:  Procedure Laterality Date  . ABDOMINAL HYSTERECTOMY    . ADENOIDECTOMY    . BLADDER SURGERY    . bone grafts    . CATARACT EXTRACTION W/ INTRAOCULAR LENS IMPLANT    . CERVICAL FUSION    . COLONOSCOPY    . COLONOSCOPY WITH ESOPHAGOGASTRODUODENOSCOPY (EGD)    . COLONOSCOPY WITH PROPOFOL N/A 04/28/2018   Procedure: COLONOSCOPY WITH PROPOFOL;  Surgeon: Manya Silvas, MD;  [facility] END[facility];  Service: Endoscopy;  Laterality: N/A;  . EYE SURGERY    . GRAFT APPLICATION     in neck  . LAPAROSCOPIC BILATERAL SALPINGO OOPHERECTOMY    . NEUROPLASTY / TRANSPOSITION ULNAR NERVE AT ELBOW    . OOPHORECTOMY    . SKIN CANCER EXCISION    . skin grafts   left forearm; hand bil.lower extremeties  . titanium plate    . TONSILLECTOMY    . TUBAL LIGATION     Family History:  Family History  Problem Relation Age of Onset  . Breast cancer Mother   . Lung cancer Father    Family Psychiatric  History: See previous Social  History:  Social History   Substance and Sexual Activity  Alcohol Use Never     Social History   Substance and Sexual Activity  Drug Use Never    Social History   Socioeconomic History  . Marital status: Single    Spouse name: Not on file  . Number of children: Not on file  . Years of education: Not on file  . Highest education level: Not on file  Occupational History  . Not on file  Tobacco Use  . Smoking status: Former Smoker    Types: Cigarettes    Quit date: 07/14/2006    Years since quitting: 14.1  . Smokeless tobacco: Never Used  Substance and Sexual Activity  . Alcohol use: Never  . Drug use: Never  . Sexual activity: Not on file  Other Topics Concern  . Not on file   Social History Narrative  . Not on file   Social Determinants of Health   Financial Resource Strain: Not on file  Food Insecurity: Not on file  Transportation Needs: Not on file  Physical Activity: Not on file  Stress: Not on file  Social Connections: Not on file   Additional Social History:    Allergies:   Allergies  Allergen Reactions  . Codeine Nausea And Vomiting  . Prednisone     Labs:  Results for orders placed or performed during the hospital encounter of 08/26/20 (from the past 48 hour(s))  Lactic acid, plasma     Status: Abnormal   Collection Time: 08/26/20  4:59 AM  Result Value Ref Range   Lactic Acid, Venous 6.7 (HH) 0.5 - 1.9 mmol/L    Comment: CRITICAL RESULT CALLED TO, READ BACK BY AND VERIFIED WITH MEGAN CHANEY AT 3810 08/26/20 MF. Performed at [facility], [address] [place], [place] [address]   CBC with Differential     Status: Abnormal   Collection Time: 08/26/20  5:08 AM  Result Value Ref Range   WBC 18.3 (H) 4.0 - 10.5 K/uL   RBC 4.86 3.87 - 5.11 MIL/uL   Hemoglobin 14.9 12.0 - 15.0 g/dL   HCT 46.0 36.0 - 46.0 %   MCV 94.7 80.0 - 100.0 fL   MCH 30.7 26.0 - 34.0 pg   MCHC 32.4 30.0 - 36.0 g/dL   RDW 14.9 11.5 - 15.5 %   Platelets 475 (H) 150 - 400 K/uL   nRBC 0.0 0.0 - 0.2 %   Neutrophils Relative % 91 %   Neutro Abs 16.7 (H) 1.7 - 7.7 K/uL   Lymphocytes Relative 3 %   Lymphs Abs 0.6 (L) 0.7 - 4.0 K/uL   Monocytes Relative 5 %   Monocytes Absolute 0.9 0.1 - 1.0 K/uL   Eosinophils Relative 0 %   Eosinophils Absolute 0.0 0.0 - 0.5 K/uL   Basophils Relative 0 %   Basophils Absolute 0.0 0.0 - 0.1 K/uL   Immature Granulocytes 1 %   Abs Immature Granulocytes 0.13 (H) 0.00 - 0.07 K/uL    Comment: Performed at [facility], [address] [place], [place] [address]  Comprehensive metabolic panel     Status: Abnormal   Collection Time: 08/26/20  5:08 AM  Result Value Ref Range   Sodium 146 (H) 135 - 145 mmol/L     Comment: LYTES REPEATED DAS   Potassium 4.8 3.5 - 5.1 mmol/L   Chloride 112 (H) 98 - 111 mmol/L   CO2 12 (L) 22 - 32 mmol/L   Glucose,  Bld 148 (H) 70 - 99 mg/dL    Comment: Glucose reference range applies only to samples taken after fasting for at least 8 hours.   BUN 43 (H) 6 - 20 mg/dL   Creatinine, Ser 3.53 (H) 0.44 - 1.00 mg/dL   Calcium 9.5 8.9 - 10.3 mg/dL   Total Protein 9.0 (H) 6.5 - 8.1 g/dL   Albumin 4.5 3.5 - 5.0 g/dL   AST 46 (H) 15 - 41 U/L   ALT 20 0 - 44 U/L   Alkaline Phosphatase 112 38 - 126 U/L   Total Bilirubin 0.5 0.3 - 1.2 mg/dL   GFR, Estimated 14 (L) >60 mL/min    Comment: (NOTE) Calculated using the CKD-EPI Creatinine Equation (2021)    Anion gap 22 (H) 5 - 15    Comment: Performed at [facility], [place], [place] [address]  Troponin I (High Sensitivity)     Status: None   Collection Time: 08/26/20  5:08 AM  Result Value Ref Range   Troponin I (High Sensitivity) 8 <18 ng/L    Comment: (NOTE) Elevated high sensitivity troponin I (hsTnI) values and significant  changes across serial measurements may suggest ACS but many other  chronic and acute conditions are known to elevate hsTnI results.  Refer to the "Links" section for chest pain algorithms and additional  guidance. Performed at [facility], [address] [place], [place] [address]   Urinalysis, Complete w Microscopic Urine, Clean Catch     Status: Abnormal   Collection Time: 08/26/20  5:08 AM  Result Value Ref Range   Color, Urine YELLOW (A) YELLOW   APPearance CLOUDY (A) CLEAR   Specific Gravity, Urine 1.009 1.005 - 1.030   pH 5.0 5.0 - 8.0   Glucose, UA 50 (A) NEGATIVE mg/dL   Hgb urine dipstick LARGE (A) NEGATIVE   Bilirubin Urine NEGATIVE NEGATIVE   Ketones, ur NEGATIVE NEGATIVE mg/dL   Protein, ur 100 (A) NEGATIVE mg/dL   Nitrite NEGATIVE NEGATIVE   Leukocytes,Ua NEGATIVE NEGATIVE   RBC / HPF >50 (H) 0 - 5 RBC/hpf   WBC, UA 11-20 0 - 5 WBC/hpf    Bacteria, UA RARE (A) NONE SEEN   Squamous Epithelial / LPF 0-5 0 - 5   WBC Clumps PRESENT     Comment: Performed at [facility], [address] [place], [place] [address]  Resp Panel by RT-PCR (Flu A&B, Covid) Urine, Clean Catch     Status: None   Collection Time: 08/26/20  5:08 AM   Specimen: Urine, Clean Catch; Nasopharyngeal(NP) swabs in vial transport medium  Result Value Ref Range   SARS Coronavirus 2 by RT PCR NEGATIVE NEGATIVE    Comment: (NOTE) SARS-CoV-2 target nucleic acids are NOT DETECTED.  The SARS-CoV-2 RNA is generally detectable in upper respiratory specimens during the acute phase of infection. The lowest concentration of SARS-CoV-2 viral copies this assay can detect is 138 copies/mL. A negative result does not preclude SARS-Cov-2 infection and should not be used as the sole basis for treatment or other patient management decisions. A negative result may occur with  improper specimen collection/handling, submission of specimen other than nasopharyngeal swab, presence of viral mutation(s) within the areas targeted by this assay, and inadequate number of viral copies(<138 copies/mL). A negative result must be combined with clinical observations, patient history, and epidemiological information. The expected result is Negative.  Fact Sheet for Patients:  EntrepreneurPulse.com.au  Fact Sheet for Healthcare Providers:  IncredibleEmployment.be  This test is no t yet approved or cleared by the [place] and  has been authorized for detection and/or diagnosis of SARS-CoV-2 by FDA under an Emergency Use Authorization (EUA). This EUA will remain  in effect (meaning this test can be used) for the duration of the COVID-19 declaration under Section 564(b)(1) of the Act, 21 U.S.C.section 360bbb-3(b)(1), unless the authorization is terminated  or revoked sooner.       Influenza A by PCR NEGATIVE NEGATIVE   Influenza B  by PCR NEGATIVE NEGATIVE    Comment: (NOTE) The Xpert Xpress SARS-CoV-2/FLU/RSV plus assay is intended as an aid in the diagnosis of influenza from Nasopharyngeal swab specimens and should not be used as a sole basis for treatment. Nasal washings and aspirates are unacceptable for Xpert Xpress SARS-CoV-2/FLU/RSV testing.  Fact Sheet for Patients: EntrepreneurPulse.com.au  Fact Sheet for Healthcare Providers: IncredibleEmployment.be  This test is not yet approved or cleared by the [place] FDA and has been authorized for detection and/or diagnosis of SARS-CoV-2 by FDA under an Emergency Use Authorization (EUA). This EUA will remain in effect (meaning this test can be used) for the duration of the COVID-19 declaration under Section 564(b)(1) of the Act, 21 U.S.C. section 360bbb-3(b)(1), unless the authorization is terminated or revoked.  Performed at [facility], [place], [place] [address]   Procalcitonin - Baseline     Status: None   Collection Time: 08/26/20  5:08 AM  Result Value Ref Range   Procalcitonin 1.77 ng/mL    Comment:        Interpretation: PCT > 0.5 ng/mL and <= 2 ng/mL: Systemic infection (sepsis) is possible, but other conditions are known to elevate PCT as well. (NOTE)       Sepsis PCT Algorithm           Lower Respiratory Tract                                      Infection PCT Algorithm    ----------------------------     ----------------------------         PCT < 0.25 ng/mL                PCT < 0.10 ng/mL          Strongly encourage             Strongly discourage   discontinuation of antibiotics    initiation of antibiotics    ----------------------------     -----------------------------       PCT 0.25 - 0.50 ng/mL            PCT 0.10 - 0.25 ng/mL               OR       >80% decrease in PCT            Discourage initiation of                                            antibiotics       Encourage discontinuation           of antibiotics    ----------------------------     -----------------------------         PCT >= 0.50 ng/mL  PCT 0.26 - 0.50 ng/mL                AND       <80% decrease in PCT             Encourage initiation of                                             antibiotics       Encourage continuation           of antibiotics    ----------------------------     -----------------------------        PCT >= 0.50 ng/mL                  PCT > 0.50 ng/mL               AND         increase in PCT                  Strongly encourage                                      initiation of antibiotics    Strongly encourage escalation           of antibiotics                                     -----------------------------                                           PCT <= 0.25 ng/mL                                                 OR                                        > 80% decrease in PCT                                      Discontinue / Do not initiate                                             antibiotics  Performed at [facility], [address], [place], [place] [address]   Urine culture     Status: None   Collection Time: 08/26/20  5:54 AM   Specimen: Urine, Clean Catch  Result Value Ref Range   Specimen Description      URINE, CLEAN CATCH Performed at [facility], [address] [place], [place] [address]    Special Requests      NONE Performed at [facility],  [place], [place] [address]    Culture      NO GROWTH Performed at [facility], [place] [address], [place], [place] [address]    Report Status 08/27/2020 FINAL   Culture, blood (routine x 2)     Status: None (Preliminary result)   Collection Time: 08/26/20  6:03 AM   Specimen: Left Antecubital; Blood  Result Value Ref Range   Specimen Description LEFT ANTECUBITAL    Special Requests      BOTTLES DRAWN AEROBIC AND  ANAEROBIC Blood Culture adequate volume   Culture      NO GROWTH 1 DAY Performed at [facility], [address] [place], [place] [address]    Report Status PENDING   Lactic acid, plasma     Status: Abnormal   Collection Time: 08/26/20  6:40 AM  Result Value Ref Range   Lactic Acid, Venous 5.3 (HH) 0.5 - 1.9 mmol/L    Comment: CRITICAL RESULT CALLED TO, READ BACK BY AND VERIFIED WITH LAURA STANFIELD AT 9147 08/26/20 DAS Performed at [facility], [place], [place] [address]   Troponin I (High Sensitivity)     Status: None   Collection Time: 08/26/20  6:40 AM  Result Value Ref Range   Troponin I (High Sensitivity) 4 <18 ng/L    Comment: (NOTE) Elevated high sensitivity troponin I (hsTnI) values and significant  changes across serial measurements may suggest ACS but many other  chronic and acute conditions are known to elevate hsTnI results.  Refer to the "Links" section for chest pain algorithms and additional  guidance. Performed at [facility], [address] [place], [place] [address]   HIV Antibody (routine testing w rflx)     Status: None   Collection Time: 08/26/20  8:27 AM  Result Value Ref Range   HIV Screen 4th Generation wRfx Non Reactive Non Reactive    Comment: Performed at [facility], [place] [address] [place], [place] [address]  Culture, blood (routine x 2)     Status: None (Preliminary result)   Collection Time: 08/26/20  8:55 AM   Specimen: BLOOD LEFT FOREARM  Result Value Ref Range   Specimen Description BLOOD LEFT FOREARM    Special Requests      BOTTLES DRAWN AEROBIC AND ANAEROBIC Blood Culture adequate volume   Culture      NO GROWTH 1 DAY Performed at [facility], [address], [place], [place] [address]    Report Status PENDING   Lactic acid, plasma     Status: Abnormal   Collection Time: 08/26/20 10:30 AM  Result Value Ref Range   Lactic Acid, Venous 2.3 (HH) 0.5 - 1.9 mmol/L     Comment: CRITICAL VALUE NOTED. VALUE IS CONSISTENT WITH PREVIOUSLY REPORTED/CALLED VALUE DAS Performed at [facility], [place], [place] [address]   Lactic acid, plasma     Status: Abnormal   Collection Time: 08/26/20  2:51 PM  Result Value Ref Range   Lactic Acid, Venous 2.8 (HH) 0.5 - 1.9 mmol/L    Comment: CRITICAL VALUE NOTED. VALUE IS CONSISTENT WITH PREVIOUSLY REPORTED/CALLED VALUE KBH Performed at [facility], [address] [place], [place] [address]   Basic metabolic panel     Status: Abnormal   Collection Time: 08/27/20  6:43 AM  Result Value Ref Range   Sodium 145 135 - 145 mmol/L   Potassium 5.4 (H) 3.5 - 5.1 mmol/L  Chloride 110 98 - 111 mmol/L   CO2 17 (L) 22 - 32 mmol/L   Glucose, Bld 75 70 - 99 mg/dL    Comment: Glucose reference range applies only to samples taken after fasting for at least 8 hours.   BUN 63 (H) 6 - 20 mg/dL   Creatinine, Ser 5.55 (H) 0.44 - 1.00 mg/dL   Calcium 9.2 8.9 - 10.3 mg/dL   GFR, Estimated 8 (L) >60 mL/min    Comment: (NOTE) Calculated using the CKD-EPI Creatinine Equation (2021)    Anion gap 18 (H) 5 - 15    Comment: Performed at [facility], [address] [place], [place] [address]  CBC     Status: Abnormal   Collection Time: 08/27/20  6:43 AM  Result Value Ref Range   WBC 15.3 (H) 4.0 - 10.5 K/uL   RBC 3.80 (L) 3.87 - 5.11 MIL/uL   Hemoglobin 11.8 (L) 12.0 - 15.0 g/dL   HCT 35.2 (L) 36.0 - 46.0 %   MCV 92.6 80.0 - 100.0 fL   MCH 31.1 26.0 - 34.0 pg   MCHC 33.5 30.0 - 36.0 g/dL   RDW 15.2 11.5 - 15.5 %   Platelets 281 150 - 400 K/uL   nRBC 0.0 0.0 - 0.2 %    Comment: Performed at [facility], [address], [place], [place] [address]  Salicylate level     Status: Abnormal   Collection Time: 08/27/20  9:11 AM  Result Value Ref Range   Salicylate Lvl <9.3 (L) 7.0 - 30.0 mg/dL    Comment: Performed at [facility], [place], [place]  [address]  Acetaminophen level     Status: Abnormal   Collection Time: 08/27/20  9:11 AM  Result Value Ref Range   Acetaminophen (Tylenol), Serum <10 (L) 10 - 30 ug/mL    Comment: (NOTE) Therapeutic concentrations vary significantly. A range of 10-30 ug/mL  may be an effective concentration for many patients. However, some  are best treated at concentrations outside of this range. Acetaminophen concentrations >150 ug/mL at 4 hours after ingestion  and >50 ug/mL at 12 hours after ingestion are often associated with  toxic reactions.  Performed at [facility], [address] [place], [place] [address]   CK     Status: Abnormal   Collection Time: 08/27/20  9:11 AM  Result Value Ref Range   Total CK 364 (H) 38 - 234 U/L    Comment: Performed at [facility], [address] [place], [place] [address]  Lactic acid, plasma     Status: Abnormal   Collection Time: 08/27/20 12:35 PM  Result Value Ref Range   Lactic Acid, Venous 3.0 (HH) 0.5 - 1.9 mmol/L    Comment: CRITICAL VALUE NOTED. VALUE IS CONSISTENT WITH PREVIOUSLY REPORTED/CALLED VALUE. QSD Performed at [facility], [address] [place], [place] [address]   Urine Drug Screen, Qualitative [facility] only)     Status: Abnormal   Collection Time: 08/27/20  2:00 PM  Result Value Ref Range   Tricyclic, Ur Screen POSITIVE (A) NONE DETECTED   Amphetamines, Ur Screen NONE DETECTED NONE DETECTED   MDMA (Ecstasy)Ur Screen NONE DETECTED NONE DETECTED   Cocaine Metabolite,Ur [place] NONE DETECTED NONE DETECTED   Opiate, Ur Screen NONE DETECTED NONE DETECTED   Phencyclidine (PCP) Ur S NONE DETECTED NONE DETECTED   Cannabinoid 50 Ng, Ur [place] NONE DETECTED NONE DETECTED   Barbiturates, Ur Screen NONE DETECTED  NONE DETECTED   Benzodiazepine, Ur Scrn POSITIVE (A) NONE DETECTED   Methadone Scn, Ur NONE DETECTED NONE DETECTED    Comment: (NOTE) Tricyclics + metabolites, urine    Cutoff 1000 ng/mL Amphetamines + metabolites,  urine  Cutoff 1000 ng/mL MDMA (Ecstasy), urine              Cutoff 500 ng/mL Cocaine Metabolite, urine          Cutoff 300 ng/mL Opiate + metabolites, urine        Cutoff 300 ng/mL Phencyclidine (PCP), urine         Cutoff 25 ng/mL Cannabinoid, urine                 Cutoff 50 ng/mL Barbiturates + metabolites, urine  Cutoff 200 ng/mL Benzodiazepine, urine              Cutoff 200 ng/mL Methadone, urine                   Cutoff 300 ng/mL  The urine drug screen provides only a preliminary, unconfirmed analytical test result and should not be used for non-medical purposes. Clinical consideration and professional judgment should be applied to any positive drug screen result due to possible interfering substances. A more specific alternate chemical method must be used in order to obtain a confirmed analytical result. Gas chromatography / mass spectrometry (GC/MS) is the preferred confirm atory method. Performed at [facility], [address] [place], [place] [address]   Lactic acid, plasma     Status: Abnormal   Collection Time: 08/27/20  3:10 PM  Result Value Ref Range   Lactic Acid, Venous 2.1 (HH) 0.5 - 1.9 mmol/L    Comment: CRITICAL VALUE NOTED. VALUE IS CONSISTENT WITH PREVIOUSLY REPORTED/CALLED VALUE SNG Performed at [facility], [address] [place], [place] [address]   Ammonia     Status: None   Collection Time: 08/27/20  3:57 PM  Result Value Ref Range   Ammonia 28 9 - 35 umol/L    Comment: Performed at [facility], [address] [place], [place] [address]  TSH     Status: None   Collection Time: 08/27/20  3:57 PM  Result Value Ref Range   TSH 1.155 0.350 - 4.500 uIU/mL    Comment: Performed by a 3rd Generation assay with a functional sensitivity of <=0.01 uIU/mL. Performed at [facility], [address] [place], [place] [address]   Folate     Status: None   Collection Time: 08/27/20  3:57 PM  Result Value Ref Range    Folate 31.0 >5.9 ng/mL    Comment: Performed at [facility], [place], [place] [address]  Rapid HIV screen (HIV 1/2 Ab+Ag) ([facility] Only)     Status: None   Collection Time: 08/27/20  3:57 PM  Result Value Ref Range   HIV-1 P24 Antigen - HIV24 NON REACTIVE NON REACTIVE    Comment: (NOTE) Detection of p24 may be inhibited by biotin in the sample, causing false negative results in acute infection.    HIV 1/2 Antibodies NON REACTIVE NON REACTIVE   Interpretation (HIV Ag Ab)      A non reactive test result means that HIV 1 or HIV 2 antibodies and HIV 1 p24 antigen were not detected in the specimen.    Comment: Performed at [facility], [address] [place], [place] [address]    Current Facility-Administered Medications  Medication Dose  Route Frequency Provider Last Rate Last Admin  . acetaminophen (TYLENOL) tablet 650 mg  650 mg Oral Q6H PRN Ivor Costa, MD       Or  . acetaminophen (TYLENOL) suppository 650 mg  650 mg Rectal Q6H PRN Ivor Costa, MD      . acyclovir (ZOVIRAX) 640 mg in dextrose 5 % 100 mL IVPB  10 mg/kg Intravenous Q24H Zeigler, Dustin G, RPH      . albuterol (PROVENTIL) (2.5 MG/3ML) 0.083% nebulizer solution 2.5 mg  2.5 mg Nebulization Q4H PRN Ivor Costa, MD      . carbidopa-levodopa (SINEMET IR) 25-100 MG per tablet immediate release 1 tablet  1 tablet Oral Daily Lorella Nimrod, MD      . carbidopa-levodopa (SINEMET IR) 25-100 MG per tablet immediate release 2 tablet  2 tablet Oral BID Lorella Nimrod, MD   2 tablet at 08/27/20 1417  . Carbidopa-Levodopa ER (SINEMET CR) 25-100 MG tablet controlled release 1 tablet  1 tablet Oral TID Oswald Hillock, RPH   1 tablet at 08/27/20 1229  . Carbidopa-Levodopa ER (SINEMET CR) 25-100 MG tablet controlled release 2 tablet  2 tablet Oral QHS Oswald Hillock, RPH      . [START ON 08/28/2020] ceFEPIme (MAXIPIME) 1 g in sodium chloride 0.9 % 100 mL IVPB  1 g Intravenous Q24H Zeigler, Sandi Mealy, RPH      .  enoxaparin (LOVENOX) injection 30 mg  30 mg Subcutaneous Q24H Dallie Piles, RPH   30 mg at 08/26/20 2234  . Ipratropium-Albuterol (COMBIVENT) respimat 1 puff  1 puff Inhalation Q6H Ivor Costa, MD      . lamoTRIgine (LAMICTAL) tablet 25 mg  25 mg Oral BID Ivor Costa, MD   25 mg at 08/27/20 0959  . LORazepam (ATIVAN) injection 0.5 mg  0.5 mg Intravenous Q4H PRN Ivor Costa, MD      . LORazepam (ATIVAN) injection 1 mg  1 mg Intravenous Q6H Hawraa Stambaugh T, MD   1 mg at 08/27/20 1551  . LORazepam (ATIVAN) injection 2 mg  2 mg Intravenous Q2H PRN Ivor Costa, MD      . ondansetron [facility]) injection 4 mg  4 mg Intravenous Q8H PRN Ivor Costa, MD      . propranolol ER (INDERAL LA) 24 hr capsule 60 mg  60 mg Oral Daily Ivor Costa, MD   60 mg at 08/27/20 0958  . sodium bicarbonate 150 mEq in dextrose 5 % 1,000 mL infusion   Intravenous Continuous Kolluru, Sarath, MD 100 mL/hr at 08/27/20 1233 New Bag at 08/27/20 1233  . sodium zirconium cyclosilicate (LOKELMA) packet 10 g  10 g Oral Q1400 Lorella Nimrod, MD   10 g at 08/27/20 1417  . thiamine 500mg  in normal saline (42ml) IVPB  500 mg Intravenous Q8H Donnetta Simpers, MD 100 mL/hr at 08/27/20 1650 500 mg at 08/27/20 1650   Followed by  . [START ON 08/29/2020] thiamine (B-1) 250 mg in sodium chloride 0.9 % 50 mL IVPB  250 mg Intravenous Daily Donnetta Simpers, MD       Followed by  . [START ON 09/04/2020] thiamine (B-1) injection 100 mg  100 mg Intravenous Daily Donnetta Simpers, MD      . vancomycin variable dose per unstable renal function (pharmacist dosing)   Does not apply See admin instructions Berton Mount, [facility]        Musculoskeletal: Strength & Muscle Tone: decreased Gait & Station: unable to stand Patient leans: N/A  Psychiatric Specialty  Exam: Physical Exam Vitals and nursing note reviewed.  Constitutional:      Appearance: She is well-developed and well-nourished.  HENT:     Head: Normocephalic and atraumatic.  Eyes:      Conjunctiva/sclera: Conjunctivae normal.     Pupils: Pupils are equal, round, and reactive to light.  Cardiovascular:     Heart sounds: Normal heart sounds.  Pulmonary:     Effort: Pulmonary effort is normal.  Abdominal:     Palpations: Abdomen is soft.  Musculoskeletal:        General: Normal range of motion.     Cervical back: Normal range of motion.  Skin:    General: Skin is warm and dry.  Neurological:     General: No focal deficit present.     Mental Status: She is alert.     Review of Systems  Unable to perform ROS: Mental status change    Blood pressure (!) 111/57, pulse 69, temperature 98.3 F (36.8 C), temperature source Oral, resp. rate 20, height 5\' 7"  (1.702 m), weight 64.1 kg, SpO2 98 %.Body mass index is 22.15 kg/m.  General Appearance: Disheveled  Eye Contact:  Minimal  Speech:  Negative  Volume:  Decreased  Mood:  Negative  Affect:  Negative  Thought Process:  NA  Orientation:  Negative  Thought Content:  Negative  Suicidal Thoughts:  No  Homicidal Thoughts:  No  Memory:  Negative  Judgement:  Negative  Insight:  Negative  Psychomotor Activity:  Negative  Concentration:  Concentration: Negative  Recall:  Negative  Fund of Knowledge:  Negative  Language:  Negative  Akathisia:  Negative  Handed:  Right  AIMS (if indicated):     Assets:  Social Support  ADL's:  Impaired  Cognition:  Impaired,  Severe  Sleep:        Treatment Plan Summary: Plan Patient continues to be severely delirious unresponsive largely.  Wide differential diagnosis.  Being treated with antibiotics and supportive therapy.  I would recommend continuing the modest dose of benzodiazepines to prevent worsening withdrawal from benzodiazepines but at this point she does not require any increase in any medication.  Continue to monitor as needed.  Disposition: Patient does not meet criteria for psychiatric inpatient admission.  Alethia Berthold, MD 08/27/2020 6:24 PM

## 2020-08-27 NOTE — Progress Notes (Signed)
Pt will not open her mouth for her evening dose of Carbidopa-Levodopa. Dr. Reesa Chew made aware via secure chat.

## 2020-08-28 ENCOUNTER — Inpatient Hospital Stay: Payer: Medicare HMO

## 2020-08-28 DIAGNOSIS — G9341 Metabolic encephalopathy: Secondary | ICD-10-CM | POA: Diagnosis not present

## 2020-08-28 DIAGNOSIS — N179 Acute kidney failure, unspecified: Secondary | ICD-10-CM | POA: Diagnosis not present

## 2020-08-28 DIAGNOSIS — D729 Disorder of white blood cells, unspecified: Secondary | ICD-10-CM | POA: Diagnosis not present

## 2020-08-28 DIAGNOSIS — R410 Disorientation, unspecified: Secondary | ICD-10-CM | POA: Diagnosis not present

## 2020-08-28 LAB — RENAL FUNCTION PANEL
Albumin: 2.9 g/dL — ABNORMAL LOW (ref 3.5–5.0)
Anion gap: 16 — ABNORMAL HIGH (ref 5–15)
BUN: 87 mg/dL — ABNORMAL HIGH (ref 6–20)
CO2: 23 mmol/L (ref 22–32)
Calcium: 8.5 mg/dL — ABNORMAL LOW (ref 8.9–10.3)
Chloride: 105 mmol/L (ref 98–111)
Creatinine, Ser: 6.92 mg/dL — ABNORMAL HIGH (ref 0.44–1.00)
GFR, Estimated: 6 mL/min — ABNORMAL LOW (ref >60)
Glucose, Bld: 120 mg/dL — ABNORMAL HIGH (ref 70–99)
Phosphorus: 5.8 mg/dL — ABNORMAL HIGH (ref 2.5–4.6)
Potassium: 3.9 mmol/L (ref 3.5–5.1)
Sodium: 144 mmol/L (ref 135–145)

## 2020-08-28 LAB — CSF CELL COUNT WITH DIFFERENTIAL
Eosinophils, CSF: 0 %
Lymphs, CSF: 5 %
Monocyte-Macrophage-Spinal Fluid: 27 %
Other Cells, CSF: 0
RBC Count, CSF: 10 /mm3 — ABNORMAL HIGH (ref 0–3)
Segmented Neutrophils-CSF: 68 %
Tube #: 3
WBC, CSF: 39 /mm3 (ref 0–5)

## 2020-08-28 LAB — CBC
HCT: 29.6 % — ABNORMAL LOW (ref 36.0–46.0)
Hemoglobin: 9.8 g/dL — ABNORMAL LOW (ref 12.0–15.0)
MCH: 30.2 pg (ref 26.0–34.0)
MCHC: 33.1 g/dL (ref 30.0–36.0)
MCV: 91.4 fL (ref 80.0–100.0)
Platelets: 268 10*3/uL (ref 150–400)
RBC: 3.24 MIL/uL — ABNORMAL LOW (ref 3.87–5.11)
RDW: 14.5 % (ref 11.5–15.5)
WBC: 7.8 10*3/uL (ref 4.0–10.5)
nRBC: 0 % (ref 0.0–0.2)

## 2020-08-28 LAB — GLUCOSE, CAPILLARY: Glucose-Capillary: 96 mg/dL (ref 70–99)

## 2020-08-28 LAB — PROTIME-INR
INR: 1.1 (ref 0.8–1.2)
Prothrombin Time: 13.3 seconds (ref 11.4–15.2)

## 2020-08-28 LAB — PATHOLOGIST SMEAR REVIEW

## 2020-08-28 LAB — RPR: RPR Ser Ql: NONREACTIVE

## 2020-08-28 LAB — PROTEIN, CSF: Total  Protein, CSF: 64 mg/dL — ABNORMAL HIGH (ref 15–45)

## 2020-08-28 LAB — GLUCOSE, CSF: Glucose, CSF: 75 mg/dL — ABNORMAL HIGH (ref 40–70)

## 2020-08-28 MED ORDER — CHLORHEXIDINE GLUCONATE CLOTH 2 % EX PADS
6.0000 | MEDICATED_PAD | Freq: Every day | CUTANEOUS | Status: DC
  Administered 2020-08-29 – 2020-09-10 (×11): 6 via TOPICAL

## 2020-08-28 MED ORDER — CYANOCOBALAMIN 1000 MCG/ML IJ SOLN
1000.0000 ug | Freq: Once | INTRAMUSCULAR | Status: AC
  Administered 2020-08-28: 1000 ug via INTRAMUSCULAR
  Filled 2020-08-28: qty 1

## 2020-08-28 MED ORDER — DEXTROSE 5 % IV SOLN
500.0000 mg | Freq: Once | INTRAVENOUS | Status: AC
  Administered 2020-08-28: 500 mg via INTRAVENOUS
  Filled 2020-08-28: qty 10

## 2020-08-28 MED ORDER — VITAMIN B-12 1000 MCG PO TABS
1000.0000 ug | ORAL_TABLET | Freq: Every day | ORAL | Status: DC
  Administered 2020-09-01 – 2020-09-10 (×8): 1000 ug via ORAL
  Filled 2020-08-28 (×11): qty 1

## 2020-08-28 MED ORDER — HEPARIN SODIUM (PORCINE) 5000 UNIT/ML IJ SOLN
5000.0000 [IU] | Freq: Three times a day (TID) | INTRAMUSCULAR | Status: DC
  Administered 2020-08-28 – 2020-09-10 (×34): 5000 [IU] via SUBCUTANEOUS
  Filled 2020-08-28 (×34): qty 1

## 2020-08-28 MED ORDER — DEXTROSE 5 % IV SOLN
10.0000 mg/kg | INTRAVENOUS | Status: DC
  Filled 2020-08-28: qty 12.8

## 2020-08-28 MED ORDER — THIAMINE HCL 100 MG/ML IJ SOLN
500.0000 mg | Freq: Three times a day (TID) | INTRAVENOUS | Status: AC
  Administered 2020-08-28 (×3): 500 mg via INTRAVENOUS
  Filled 2020-08-28 (×3): qty 5

## 2020-08-28 MED ORDER — SODIUM CHLORIDE 0.9 % IV SOLN
2.0000 g | Freq: Two times a day (BID) | INTRAVENOUS | Status: DC
  Administered 2020-08-29: 2 g via INTRAVENOUS
  Filled 2020-08-28 (×2): qty 20

## 2020-08-28 MED ORDER — DEXTROSE 5 % IV SOLN
5.0000 mg/kg | INTRAVENOUS | Status: DC
  Filled 2020-08-28: qty 6.4

## 2020-08-28 MED ORDER — LORAZEPAM 2 MG/ML IJ SOLN: 0.5000 mg | Freq: Three times a day (TID) | INTRAMUSCULAR | Status: DC

## 2020-08-28 MED ORDER — CYANOCOBALAMIN 1000 MCG/ML IJ SOLN
1000.0000 ug | Freq: Every day | INTRAMUSCULAR | Status: DC
  Administered 2020-08-30 – 2020-09-04 (×4): 1000 ug via INTRAMUSCULAR
  Filled 2020-08-28 (×13): qty 1

## 2020-08-28 MED ORDER — THIAMINE HCL 100 MG/ML IJ SOLN
250.0000 mg | Freq: Every day | INTRAVENOUS | Status: DC
  Administered 2020-08-30 – 2020-09-03 (×4): 250 mg via INTRAVENOUS
  Filled 2020-08-28 (×7): qty 2.5

## 2020-08-28 MED ORDER — THIAMINE HCL 100 MG/ML IJ SOLN: 100.0000 mg | Freq: Every day | INTRAMUSCULAR | Status: DC

## 2020-08-28 MED ORDER — LACTATED RINGERS IV SOLN: INTRAVENOUS | Status: DC

## 2020-08-28 NOTE — Significant Event (Signed)
Rapid Response Event Note   Reason for Call :   Altered mental status Initial Focused Assessment:   Per bedside RN- patient came back from having EEG and LP performed and patient more altered than previous assessment. Vitals stable on room air- O2 sats are 98%.     Interventions:    Plan of Care:  Dr. Reesa Chew at bedside- she ordered to have an ABG, foley inserted and neuro consulted to assess patient. Patient to stay in room 250 at this time.   Event Summary:   MD Notified: Dr. Reesa Chew Call Time:14:45 Arrival Time:14:46 End Time:15:00  Penne Lash, RN

## 2020-08-28 NOTE — Progress Notes (Signed)
Pt is currently off the unit to a procedure.

## 2020-08-28 NOTE — Progress Notes (Signed)
eeg done °

## 2020-08-28 NOTE — Progress Notes (Signed)
NEUROLOGY CONSULTATION PROGRESS NOTE   Date of service: August 28, 2020 Patient Name: Stacy Pearson MRN:  [id number] DOB:  22-Nov-1960  Brief HPI  Stacy Pearson is a 60 y.o. female admitted with somnolence, confusion, agitation and lethargy. Found to have sepsis with unclear/unknown source, elevated serum lactate along with significant AKI. Started on Meningitis coverage and LP after about 2 doses of Vanc, Cefepime and Acyclovir with pleocytosis WBC count of 49 and mildly elevated CSF protein. CTH negative. MRI Brain pending. rEEG with no seizures.   Interval Hx   Notified by RN that patient is more confused after she returned from LP and EEG. She was evaluated by Rapid Response team earlier.  Evalauted patient at bedside along with her primary RN besides me. Did discuss that it might be appropriate to move her to Progressive care if she continues to worsen at this pace.  Vitals   Vitals:   08/27/20 1945 08/28/20 0245 08/28/20 0757 08/28/20 1537  BP: 128/64 (!) 146/82 135/80 (!) 145/95  Pulse: 68 72 79 83  Resp: 20 18 16 17   Temp: 97.8 F (36.6 C) 97.6 F (36.4 C) 98.1 F (36.7 C) 98.1 F (36.7 C)  TempSrc:  Oral    SpO2: 99% 100% 97% 92%  Weight:  66.8 kg    Height:         Body mass index is 23.05 kg/m.  Physical Exam   General: Laying diagonally in bed; in no acute distress. HENT: Normal oropharynx and mucosa. Normal external appearance of ears and nose. Neck: Supple, no pain or tenderness CV: No JVD. No peripheral edema. Pulmonary: Symmetric Chest rise. Normal respiratory effort. Abdomen: Soft to touch, non-tender. Ext: No cyanosis, edema, or deformity Skin: No rash. Normal palpation of skin.  Musculoskeletal: Normal digits and nails by inspection. No clubbing.  Neurologic Examination  Mental status/Cognition: Eyes closed. Partially opens eyes to vigorous tactile stimulation. Much less interactive then yesterday. With a lot of encouragement, she eventually  opens her eyes briefly regard my face but does not track. She responds with yes. Speech/language: Only spoke a few words including yes and okay during my brief interaction. Does not follow commands. Cranial nerves:   CN II Pupils equal and reactive to light, no VF deficits   CN III,IV,VI EOM intact, no gaze preference or deviation, no nystagmus   CN V    CN VII Some initial concern for a mild L facial droop but none noted when she grimaces.   CN VIII Responds to yes to loud voice later in exam and turns towards speech   CN IX & X    CN XI    CN XII    Gag intact.  Motor:  Muscle bulk: normal, tone normal  Both arms fall to the side when lifted above her head and let go. She withdraws and lifts her legs up in the air and holds for a couple seconds when the bottom of her feet are stroked bilaterally.  Reflexes:  Right Left Comments  Pectoralis      Biceps (C5/6) 2 2   Brachioradialis (C5/6) 2 2    Triceps (C6/7) 2 2    Patellar (L3/4) 2 2    Achilles (S1)      Hoffman      Plantar     Jaw jerk    Sensation:  Light touch Grimaces to nailbed pressure in all extremities.   Pin prick    Temperature    Vibration  Proprioception    Coordination/Complex Motor:  Unable to assess but no obvious ataxia or tremor.  Labs   Basic Metabolic Panel:  Lab Results  Component Value Date   NA 144 08/28/2020   K 3.9 08/28/2020   CO2 23 08/28/2020   GLUCOSE 120 (H) 08/28/2020   BUN 87 (H) 08/28/2020   CREATININE 6.92 (H) 08/28/2020   CALCIUM 8.5 (L) 08/28/2020   GFRNONAA 6 (L) 08/28/2020   GFRAA >60 03/06/2013   HbA1c: No results found for: HGBA1C LDL: No results found for: [facility] Urine Drug Screen:     Component Value Date/Time   LABOPIA NONE DETECTED 08/27/2020 1400   COCAINSCRNUR NONE DETECTED 08/27/2020 1400   LABBENZ POSITIVE (A) 08/27/2020 1400   AMPHETMU NONE DETECTED 08/27/2020 1400   THCU NONE DETECTED 08/27/2020 1400   LABBARB NONE DETECTED 08/27/2020 1400     Alcohol Level No results found for: ETH No results found for: PHENYTOIN, ZONISAMIDE, LAMOTRIGINE, LEVETIRACETA No results found for: PHENYTOIN, PHENOBARB, VALPROATE, CBMZ  Imaging and Diagnostic studies   CTH without contrast: CTH was negative for a large hypodensity concerning for a large territory infarct or hyperdensity concerning for an ICH.  LP with WBC count of 39, RBC count of 10. Protein of 64, glucose of 75. CSF Gram stain with no organisms. CSF cultures pending.  REEG: This study is suggestive of moderate diffuse encephalopathy, nonspecific etiology. Given the morphology and frequency of periodic discharges with triphasic morphology are most likely due to toxic-metabolic causes. No seizures or definite epileptiform discharges were seen throughout the recording.  Impression   Stacy Pearson is a 60 y.o. female  admitted with somnolence, confusion, agitation and lethargy. Found to have sepsis with unclear/unknown source, elevated serum lactate along with significant AKI. Her neurologic examination is notable for worsening somnolence, no focal deficit.  Labs with low normal Vit B12 levels. Vit B12 replacement ordered. LP with pleocytosis with WBC count of 39 with a RBC count of 10. LP findings are confounded by the fact that she was started on Acyclovir, Cefepime, and Vancomycin a day before her LP. Continue broad spectrum coverage. Will get ID team on board to assist with Antimicrobial management and help with de-escalation.  Likely, her presentation is due to a combination of meningitis along with contribution from poor renal clearance.  Recommendations  - Antimicrobials and duration per ID - Vit B12 replacement. PO or IM. - Continue Thiamine high dose replacement until levels returns. - I ordered MRI Brain without contrast to evaluate for any other intracranial abnormalities. CTH w/o contrast was negative. - Will consider repeating rEEG if she continues to be  somnolent. ______________________________________________________________________   Thank you for the opportunity to take part in the care of this patient. If you have any further questions, please contact the neurology consultation attending.  Signed,  [place] Pager Number [phone]

## 2020-08-28 NOTE — Consult Note (Signed)
[place] Psychiatry Consult   Reason for Consult: Follow-up patient with ongoing delirium of unclear etiology Referring Physician: Reesa Chew Patient Identification: Stacy Pearson MRN:  [id number] Principal Diagnosis: Delirium Diagnosis:  Principal Problem:   Delirium Active Problems:   Bipolar 1 disorder ([place])   COPD (chronic obstructive pulmonary disease) (HCC)   Essential tremor   Migraine without aura   Parkinson's disease ([place])   Epilepsy ([place])   Depression with anxiety   HTN (hypertension)   Acute metabolic encephalopathy   UTI (urinary tract infection)   AKI (acute kidney injury) ([place])   Severe sepsis with septic shock ([place])   Total Time spent with patient: 30 minutes  Subjective:   Stacy Pearson is a 60 y.o. female patient admitted with unable to communicate.  HPI: Patient seen.  Not much different than yesterday.  Eyelids will flicker a little to her name but she does not respond verbally.  Chart reviewed.  Looks like the spinal tap may have some abnormal findings.  Look forward to comment from infectious disease and more results from the lumbar puncture.  Patient remains extremely delirious unresponsive not aggressive though.  Past Psychiatric History: See previous  Risk to Self:   Risk to Others:   Prior Inpatient Therapy:   Prior Outpatient Therapy:    Past Medical History:  Past Medical History:  Diagnosis Date  . Abdominal pain   . Allergic genetic state   . Anxiety   . Asthma   . Basal cell carcinoma   . Bipolar disorder ([place])   . Cancer ([place])    MOS surgery basal cell face  . COPD (chronic obstructive pulmonary disease) ([place])   . Depression   . Eating disorder   . Eczema   . Encounter for blood transfusion   . Essential tremor   . 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 GERD (gastroesophageal reflux disease)   . Headache   . History of basal cell carcinoma   . Hypertension   . Neuropathy   . Parkinson's  disease ([place])   . Parkinson's disease ([place])   . [facility])     Past Surgical History:  Procedure Laterality Date  . ABDOMINAL HYSTERECTOMY    . ADENOIDECTOMY    . BLADDER SURGERY    . bone grafts    . CATARACT EXTRACTION W/ INTRAOCULAR LENS IMPLANT    . CERVICAL FUSION    . COLONOSCOPY    . COLONOSCOPY WITH ESOPHAGOGASTRODUODENOSCOPY (EGD)    . COLONOSCOPY WITH PROPOFOL N/A 04/28/2018   Procedure: COLONOSCOPY WITH PROPOFOL;  Surgeon: Manya Silvas, MD;  Location[facility] ENDOSCOPY;  Service: Endoscopy;  Laterality: N/A;  . EYE SURGERY    . GRAFT APPLICATION     in neck  . LAPAROSCOPIC BILATERAL SALPINGO OOPHERECTOMY    . NEUROPLASTY / TRANSPOSITION ULNAR NERVE AT ELBOW    . OOPHORECTOMY    . SKIN CANCER EXCISION    . skin grafts   left forearm; hand bil.lower extremeties  . titanium plate    . TONSILLECTOMY    . TUBAL LIGATION     Family History:  Family History  Problem Relation Age of Onset  . Breast cancer Mother   . Lung cancer Father    Family Psychiatric  History: See previous Social History:  Social History   Substance and Sexual Activity  Alcohol Use Never     Social History   Substance and Sexual Activity  Drug Use Never  Social History   Socioeconomic History  . Marital status: Single    Spouse name: Not on file  . Number of children: Not on file  . Years of education: Not on file  . Highest education level: Not on file  Occupational History  . Not on file  Tobacco Use  . Smoking status: Former Smoker    Types: Cigarettes    Quit date: 07/14/2006    Years since quitting: 14.1  . Smokeless tobacco: Never Used  Substance and Sexual Activity  . Alcohol use: Never  . Drug use: Never  . Sexual activity: Not on file  Other Topics Concern  . Not on file  Social History Narrative  . Not on file   Social Determinants of Health   Financial Resource Strain: Not on file  Food Insecurity: Not on file  Transportation Needs: Not on file   Physical Activity: Not on file  Stress: Not on file  Social Connections: Not on file   Additional Social History:    Allergies:   Allergies  Allergen Reactions  . Codeine Nausea And Vomiting  . Prednisone     Labs:  Results for orders placed or performed during the hospital encounter of 08/26/20 (from the past 48 hour(s))  Basic metabolic panel     Status: Abnormal   Collection Time: 08/27/20  6:43 AM  Result Value Ref Range   Sodium 145 135 - 145 mmol/L   Potassium 5.4 (H) 3.5 - 5.1 mmol/L   Chloride 110 98 - 111 mmol/L   CO2 17 (L) 22 - 32 mmol/L   Glucose, Bld 75 70 - 99 mg/dL    Comment: Glucose reference range applies only to samples taken after fasting for at least 8 hours.   BUN 63 (H) 6 - 20 mg/dL   Creatinine, Ser 5.55 (H) 0.44 - 1.00 mg/dL   Calcium 9.2 8.9 - 10.3 mg/dL   GFR, Estimated 8 (L) >60 mL/min    Comment: (NOTE) Calculated using the CKD-EPI Creatinine Equation (2021)    Anion gap 18 (H) 5 - 15    Comment: Performed at [facility], [address] [place], [place] [address]  CBC     Status: Abnormal   Collection Time: 08/27/20  6:43 AM  Result Value Ref Range   WBC 15.3 (H) 4.0 - 10.5 K/uL   RBC 3.80 (L) 3.87 - 5.11 MIL/uL   Hemoglobin 11.8 (L) 12.0 - 15.0 g/dL   HCT 35.2 (L) 36.0 - 46.0 %   MCV 92.6 80.0 - 100.0 fL   MCH 31.1 26.0 - 34.0 pg   MCHC 33.5 30.0 - 36.0 g/dL   RDW 15.2 11.5 - 15.5 %   Platelets 281 150 - 400 K/uL   nRBC 0.0 0.0 - 0.2 %    Comment: Performed at [facility], [address], [place], [place] [address]  Salicylate level     Status: Abnormal   Collection Time: 08/27/20  9:11 AM  Result Value Ref Range   Salicylate Lvl <9.3 (L) 7.0 - 30.0 mg/dL    Comment: Performed at [facility], [place], [place] [address]  Acetaminophen level     Status: Abnormal   Collection Time: 08/27/20  9:11 AM  Result Value Ref Range   Acetaminophen (Tylenol), Serum <10 (L) 10 - 30 ug/mL     Comment: (NOTE) Therapeutic concentrations vary significantly. A range of 10-30 ug/mL  may be an effective concentration for many patients. However,  some  are best treated at concentrations outside of this range. Acetaminophen concentrations >150 ug/mL at 4 hours after ingestion  and >50 ug/mL at 12 hours after ingestion are often associated with  toxic reactions.  Performed at [facility], [address] [place], [place] [address]   CK     Status: Abnormal   Collection Time: 08/27/20  9:11 AM  Result Value Ref Range   Total CK 364 (H) 38 - 234 U/L    Comment: Performed at [facility], [address] [place], [place] [address]  Lactic acid, plasma     Status: Abnormal   Collection Time: 08/27/20 12:35 PM  Result Value Ref Range   Lactic Acid, Venous 3.0 (HH) 0.5 - 1.9 mmol/L    Comment: CRITICAL VALUE NOTED. VALUE IS CONSISTENT WITH PREVIOUSLY REPORTED/CALLED VALUE. QSD Performed at [facility], [address] [place], [place] [address]   Urine Drug Screen, [facility] only)     Status: Abnormal   Collection Time: 08/27/20  2:00 PM  Result Value Ref Range   Tricyclic, Ur Screen POSITIVE (A) NONE DETECTED   Amphetamines, Ur Screen NONE DETECTED NONE DETECTED   MDMA (Ecstasy)Ur Screen NONE DETECTED NONE DETECTED   Cocaine Metabolite,Ur [place] NONE DETECTED NONE DETECTED   Opiate, Ur Screen NONE DETECTED NONE DETECTED   Phencyclidine (PCP) Ur S NONE DETECTED NONE DETECTED   Cannabinoid 50 Ng, Ur [place] NONE DETECTED NONE DETECTED   Barbiturates, Ur Screen NONE DETECTED NONE DETECTED   Benzodiazepine, Ur Scrn POSITIVE (A) NONE DETECTED   Methadone Scn, Ur NONE DETECTED NONE DETECTED    Comment: (NOTE) Tricyclics + metabolites, urine    Cutoff 1000 ng/mL Amphetamines + metabolites, urine  Cutoff 1000 ng/mL MDMA (Ecstasy), urine              Cutoff 500 ng/mL Cocaine Metabolite, urine          Cutoff 300 ng/mL Opiate + metabolites, urine         Cutoff 300 ng/mL Phencyclidine (PCP), urine         Cutoff 25 ng/mL Cannabinoid, urine                 Cutoff 50 ng/mL Barbiturates + metabolites, urine  Cutoff 200 ng/mL Benzodiazepine, urine              Cutoff 200 ng/mL Methadone, urine                   Cutoff 300 ng/mL  The urine drug screen provides only a preliminary, unconfirmed analytical test result and should not be used for non-medical purposes. Clinical consideration and professional judgment should be applied to any positive drug screen result due to possible interfering substances. A more specific alternate chemical method must be used in order to obtain a confirmed analytical result. Gas chromatography / mass spectrometry (GC/MS) is the preferred confirm atory method. Performed at [facility], [address] [place], [place] [address]   Lactic acid, plasma     Status: Abnormal   Collection Time: 08/27/20  3:10 PM  Result Value Ref Range   Lactic Acid, Venous 2.1 (HH) 0.5 - 1.9 mmol/L    Comment: CRITICAL VALUE NOTED. VALUE IS CONSISTENT WITH PREVIOUSLY REPORTED/CALLED VALUE SNG Performed at [facility], [address] [place],  [address]   Ammonia     Status: None   Collection Time: 08/27/20  3:57 PM  Result Value Ref Range  Ammonia 28 9 - 35 umol/L    Comment: Performed at [facility], [address] [place], [place] [address]  RPR     Status: None   Collection Time: 08/27/20  3:57 PM  Result Value Ref Range   RPR Ser Ql NON REACTIVE NON REACTIVE    Comment: Performed at [facility], [place] [address] [place], [place] [address]  TSH     Status: None   Collection Time: 08/27/20  3:57 PM  Result Value Ref Range   TSH 1.155 0.350 - 4.500 uIU/mL    Comment: Performed by a 3rd Generation assay with a functional sensitivity of <=0.01 uIU/mL. Performed at [facility], [address] [place], [place] [address]   Vitamin B12     Status: None   Collection  Time: 08/27/20  3:57 PM  Result Value Ref Range   Vitamin B-12 221 180 - 914 pg/mL    Comment: (NOTE) This assay is not validated for testing neonatal or myeloproliferative syndrome specimens for Vitamin B12 levels. Performed at [facility], [address], [place], [place] [address]   Folate     Status: None   Collection Time: 08/27/20  3:57 PM  Result Value Ref Range   Folate 31.0 >5.9 ng/mL    Comment: Performed at [facility], [place], [place] [address]  Rapid HIV screen (HIV 1/2 Ab+Ag) ([facility] Only)     Status: None   Collection Time: 08/27/20  3:57 PM  Result Value Ref Range   HIV-1 P24 Antigen - HIV24 NON REACTIVE NON REACTIVE    Comment: (NOTE) Detection of p24 may be inhibited by biotin in the sample, causing false negative results in acute infection.    HIV 1/2 Antibodies NON REACTIVE NON REACTIVE   Interpretation (HIV Ag Ab)      A non reactive test result means that HIV 1 or HIV 2 antibodies and HIV 1 p24 antigen were not detected in the specimen.    Comment: Performed at [facility], [address] [place], [place] [address]  Vancomycin, random     Status: None   Collection Time: 08/27/20  5:24 PM  Result Value Ref Range   Vancomycin Rm 22     Comment:        Random Vancomycin therapeutic range is dependent on dosage and time of specimen collection. A peak range is 20.0-40.0 ug/mL A trough range is 5.0-15.0 ug/mL        Performed at [facility], [address] [place],  [address]   Renal function panel     Status: Abnormal   Collection Time: 08/28/20  9:02 AM  Result Value Ref Range   Sodium 144 135 - 145 mmol/L   Potassium 3.9 3.5 - 5.1 mmol/L   Chloride 105 98 - 111 mmol/L   CO2 23 22 - 32 mmol/L   Glucose, Bld 120 (H) 70 - 99 mg/dL    Comment: Glucose reference range applies only to samples taken after fasting for at least 8 hours.   BUN 87 (H) 6 - 20 mg/dL   Creatinine, Ser 6.92 (H) 0.44 -  1.00 mg/dL   Calcium 8.5 (L) 8.9 - 10.3 mg/dL   Phosphorus 5.8 (H) 2.5 - 4.6 mg/dL   Albumin 2.9 (L) 3.5 - 5.0 g/dL   GFR, Estimated 6 (L) >60 mL/min    Comment: (NOTE) Calculated using the CKD-EPI Creatinine Equation (2021)    Anion  gap 16 (H) 5 - 15    Comment: Performed at [facility], [address] [place], [place] [address]  Protime-INR     Status: None   Collection Time: 08/28/20  9:02 AM  Result Value Ref Range   Prothrombin Time 13.3 11.4 - 15.2 seconds   INR 1.1 0.8 - 1.2    Comment: (NOTE) INR goal varies based on device and disease states. Performed at [facility], [address] [place], [place] [address]   CBC     Status: Abnormal   Collection Time: 08/28/20  9:02 AM  Result Value Ref Range   WBC 7.8 4.0 - 10.5 K/uL   RBC 3.24 (L) 3.87 - 5.11 MIL/uL   Hemoglobin 9.8 (L) 12.0 - 15.0 g/dL   HCT 29.6 (L) 36.0 - 46.0 %   MCV 91.4 80.0 - 100.0 fL   MCH 30.2 26.0 - 34.0 pg   MCHC 33.1 30.0 - 36.0 g/dL   RDW 14.5 11.5 - 15.5 %   Platelets 268 150 - 400 K/uL   nRBC 0.0 0.0 - 0.2 %    Comment: Performed at [facility], [address] [place], [place] [address]  Glucose, CSF     Status: Abnormal   Collection Time: 08/28/20 12:01 PM  Result Value Ref Range   Glucose, CSF 75 (H) 40 - 70 mg/dL    Comment: Performed at [facility], [address] [place], [place] [address]  Protein, CSF     Status: Abnormal   Collection Time: 08/28/20 12:01 PM  Result Value Ref Range   Total  Protein, CSF 64 (H) 15 - 45 mg/dL    Comment: Performed at [facility], [address] [place], [place] [address]  CSF cell count with differential     Status: Abnormal   Collection Time: 08/28/20 12:01 PM  Result Value Ref Range   Tube # 3    Color, CSF COLORLESS COLORLESS   Appearance, CSF CLEAR CLEAR   Supernatant NOT INDICATED    RBC Count, CSF 10 (H) 0 - 3 /cu mm   WBC, CSF 39 (HH) 0 - 5 /cu mm    Comment: CRITICAL RESULT CALLED  TO, READ BACK BY AND VERIFIED WITH: EDWINNA HICKMAN RN AT 1343 ON 08/28/20 SNG    Segmented Neutrophils-CSF 68 %   Lymphs, CSF 5 %   Monocyte-Macrophage-Spinal Fluid 27 %   Eosinophils, CSF 0 %   Other Cells, CSF 0     Comment: Performed at [facility], [place], [place] [address]  CSF culture w Gram Stain     Status: None (Preliminary result)   Collection Time: 08/28/20 12:01 PM   Specimen: PATH Cytology CSF; Cerebrospinal Fluid  Result Value Ref Range   Specimen Description      CSF Performed at [facility], [address], [place], [place] [address]    Special Requests      NONE Performed at [facility] [address], [place], [place] [address]    Gram Stain      WBC SEEN RED BLOOD CELLS NO ORGANISMS SEEN Performed at [facility], [address] [place], [place] [address]    Culture PENDING    Report Status PENDING   Culture, fungus without smear     Status: None (Preliminary result)   Collection Time: 08/28/20 12:01 PM   Specimen: PATH Cytology CSF; Cerebrospinal Fluid  Result Value Ref Range  Specimen Description      CSF Performed at [facility], [address], [place], [place] [address]    Special Requests      NONE Performed at [facility], [place] [address] [place], [place] [address]    Culture PENDING    Report Status PENDING   Pathologist smear review     Status: None   Collection Time: 08/28/20 12:01 PM  Result Value Ref Range   Path Review Cytospin slide of cerebrospinal fluid is reviewed.     Comment: I agree with the reported differential. No immature cells or atypical cells are identified. Reviewed by Lemmie Evens. Dicie Beam, MD. Performed at [facility], [address] [place], [place] [address]   Glucose, capillary     Status: None   Collection Time: 08/28/20  2:49 PM  Result Value Ref Range   Glucose-Capillary 96 70 - 99 mg/dL    Comment: Glucose reference range  applies only to samples taken after fasting for at least 8 hours.    Current Facility-Administered Medications  Medication Dose Route Frequency Provider Last Rate Last Admin  . acetaminophen (TYLENOL) tablet 650 mg  650 mg Oral Q6H PRN Ivor Costa, MD       Or  . acetaminophen (TYLENOL) suppository 650 mg  650 mg Rectal Q6H PRN Ivor Costa, MD      . albuterol (PROVENTIL) (2.5 MG/3ML) 0.083% nebulizer solution 2.5 mg  2.5 mg Nebulization Q4H PRN Ivor Costa, MD      . carbidopa-levodopa (SINEMET IR) 25-100 MG per tablet immediate release 1 tablet  1 tablet Oral Daily Lorella Nimrod, MD      . carbidopa-levodopa (SINEMET IR) 25-100 MG per tablet immediate release 2 tablet  2 tablet Oral BID Lorella Nimrod, MD   2 tablet at 08/27/20 1417  . Carbidopa-Levodopa ER (SINEMET CR) 25-100 MG tablet controlled release 1 tablet  1 tablet Oral TID Oswald Hillock, RPH   1 tablet at 08/27/20 1229  . Carbidopa-Levodopa ER (SINEMET CR) 25-100 MG tablet controlled release 2 tablet  2 tablet Oral QHS Oswald Hillock, RPH   2 tablet at 08/27/20 2134  . [START ON 08/29/2020] cefTRIAXone (ROCEPHIN) 2 g in sodium chloride 0.9 % 100 mL IVPB  2 g Intravenous Q12H Tsosie Billing, MD      . Derrill Memo ON 08/29/2020] Chlorhexidine Gluconate Cloth 2 % PADS 6 each  6 each Topical Q0600 Lorella Nimrod, MD      . cyanocobalamin ((VITAMIN B-12)) injection 1,000 mcg  1,000 mcg Intramuscular Once Donnetta Simpers, MD      . Derrill Memo ON 08/29/2020] vitamin B-12 (CYANOCOBALAMIN) tablet 1,000 mcg  1,000 mcg Oral Daily Donnetta Simpers, MD       Or  . Derrill Memo ON 08/29/2020] cyanocobalamin ((VITAMIN B-12)) injection 1,000 mcg  1,000 mcg Intramuscular Daily Donnetta Simpers, MD      . heparin injection 5,000 Units  5,000 Units Subcutaneous Q8H Oswald Hillock, RPH      . Ipratropium-Albuterol (COMBIVENT) respimat 1 puff  1 puff Inhalation Q6H Ivor Costa, MD      . lactated ringers infusion   Intravenous Continuous Lorella Nimrod, MD 75  mL/hr at 08/28/20 1608 New Bag at 08/28/20 1608  . lamoTRIgine (LAMICTAL) tablet 25 mg  25 mg Oral BID Ivor Costa, MD   25 mg at 08/28/20 0900  . LORazepam (ATIVAN) injection 0.5 mg  0.5 mg Intravenous Q4H PRN Ivor Costa, MD      . LORazepam (  ATIVAN) injection 0.5 mg  0.5 mg Intravenous Q8H Amin, Soundra Pilon, MD      . LORazepam (ATIVAN) injection 2 mg  2 mg Intravenous Q2H PRN Ivor Costa, MD      . ondansetron [facility]) injection 4 mg  4 mg Intravenous Q8H PRN Ivor Costa, MD      . propranolol ER (INDERAL LA) 24 hr capsule 60 mg  60 mg Oral Daily Ivor Costa, MD   60 mg at 08/28/20 0900  . thiamine 500mg  in normal saline (43ml) IVPB  500 mg Intravenous Ferdinand Cava, MD   Stopped at 08/28/20 1648   Followed by  . [START ON 08/29/2020] thiamine (B-1) 250 mg in sodium chloride 0.9 % 50 mL IVPB  250 mg Intravenous Daily Lorella Nimrod, MD       Followed by  . [START ON 09/04/2020] thiamine (B-1) injection 100 mg  100 mg Intravenous Daily Lorella Nimrod, MD      . vancomycin variable dose per unstable renal function (pharmacist dosing)   Does not apply See admin instructions Berton Mount, [facility]        Musculoskeletal: Strength & Muscle Tone: decreased Gait & Station: unable to stand Patient leans: N/A  Psychiatric Specialty Exam: Physical Exam Vitals and nursing note reviewed.  Constitutional:      Appearance: She is well-developed and well-nourished. She is ill-appearing.  HENT:     Head: Normocephalic and atraumatic.  Eyes:     Conjunctiva/sclera: Conjunctivae normal.     Pupils: Pupils are equal, round, and reactive to light.  Cardiovascular:     Heart sounds: Normal heart sounds.  Pulmonary:     Effort: Pulmonary effort is normal.  Abdominal:     Palpations: Abdomen is soft.  Musculoskeletal:        General: Normal range of motion.     Cervical back: Normal range of motion.  Skin:    General: Skin is warm and dry.     Review of Systems  Unable to perform ROS: Mental status  change    Blood pressure (!) 145/95, pulse 83, temperature 98.1 F (36.7 C), resp. rate 17, height 5\' 7"  (1.702 m), weight 66.8 kg, SpO2 92 %.Body mass index is 23.05 kg/m.  General Appearance: Disheveled  Eye Contact:  None  Speech:  Negative  Volume:  Decreased  Mood:  Negative  Affect:  Negative  Thought Process:  NA  Orientation:  Negative  Thought Content:  Negative  Suicidal Thoughts:  No  Homicidal Thoughts:  No  Memory:  Negative  Judgement:  Negative  Insight:  Negative  Psychomotor Activity:  Negative  Concentration:  Concentration: Negative  Recall:  Negative  Fund of Knowledge:  Negative  Language:  Negative  Akathisia:  Negative  Handed:  Right  AIMS (if indicated):     Assets:  Social Support  ADL's:  Impaired  Cognition:  Impaired,  Severe  Sleep:        Treatment Plan Summary: Plan Doubtful that this is psychiatric at this point.  Possibly still could have represented some kind of overdose but minimal recovery.  Further work-up is proceeding.  No changes to medicine.  Disposition: Patient does not meet criteria for psychiatric inpatient admission. Supportive therapy provided about ongoing stressors.  Alethia Berthold, MD 08/28/2020 7:10 PM

## 2020-08-28 NOTE — Progress Notes (Signed)
Pt returned from room from EEG. Pt normally shakes her head and say, "yes ma'am", The pt was using abdominal muscles while breathing. Rapid response and Dr. Reesa Pearson notified. Dr. Lorrin Goodell also notified regarding the patient.

## 2020-08-28 NOTE — Progress Notes (Addendum)
Pharmacy Antibiotic Note  Stacy Pearson is a 60 y.o. female admitted on 08/26/2020 with sepsis with unclear origin of infection. Patient found by mother with decreased level of consciousness and labored breathing.   Pharmacy has been consulted for vancomycin and cefepime dosing. Her renal function is noted to be significantly impaired compared to her last baseline level. She is scheduled to receive 1500 mg IV vancomycin as a loading dose in the ED  Today, 08/28/2020 Day #3 antibiotics - currently vancomycin/cefepime Day #2 acyclovir  Indication: rule out meningitis per neurology  Renal: Scr worsening  WBC improved  Afebrile  CK 364  Urine Drug screen - detects TCA and benzos  CXR and CT head unrevealing for acute infectious process  Urine Culture: No growth  Blood Culture: pending   Vancomycin 1500mg  IV x 1 given 2/13 at 0900  Vanco random 2/14 at 17:24 = 22 mcg/mL  Plan:  Vancomycin 1gm x 1 today and will plan to redose per levels  Cefepime 1gm IV q24h   Change Acyclovir to 5mg /kg q24h for worsening renal function  Follow renal function closely  Awaiting LP  Monitor for seizures on cefepime and acyclovir with poor renal fx and PMH of seizures  Height: 5\' 7"  (170.2 cm) Weight: 66.8 kg (147 lb 3.2 oz) IBW/kg (Calculated) : 61.6  Temp (24hrs), Avg:98 F (36.7 C), Min:97.6 F (36.4 C), Max:98.4 F (36.9 C)  Recent Labs  Lab 08/26/20 0508 08/26/20 0640 08/26/20 1030 08/26/20 1451 08/27/20 0643 08/27/20 1235 08/27/20 1510 08/27/20 1724 08/28/20 0902  WBC 18.3*  --   --   --  15.3*  --   --   --  7.8  CREATININE 3.53*  --   --   --  5.55*  --   --   --  6.92*  LATICACIDVEN  --  5.3* 2.3* 2.8*  --  3.0* 2.1*  --   --   VANCORANDOM  --   --   --   --   --   --   --  22  --     Estimated Creatinine Clearance: 8.5 mL/min (A) (by C-G formula based on SCr of 6.92 mg/dL (H)).    Allergies  Allergen Reactions  . Codeine Nausea And Vomiting  . Prednisone      Antimicrobials this admission: vancomycin 2/13 >>  cefepime 2/13 >>  Acyclovir 2/13 >>  Microbiology results: 2/13 BCx: NGTD 2/13 UCx: NG 2/13 SARS CoV-2: negative 2/13 influenza A/B: negaive  Thank you for allowing pharmacy to be a part of this patient's care.  Doreene Eland, PharmD, BCPS.   Work Cell: [phone] 08/28/2020 10:21 AM

## 2020-08-28 NOTE — Procedures (Signed)
Patient Name: GIAVANNA KANG  MRN: [id number]  Epilepsy Attending: Lora Havens  Referring Physician/Provider: Dr Malvin Johns Date: 08/28/2020 Duration: 26.12 mins  Patient history: 60yo F with ams. EEG to evaluate for seizure  Level of alertness: Awake  AEDs during EEG study: LTG, LRZ  Technical aspects: This EEG study was done with scalp electrodes positioned according to the 10-20 International system of electrode placement. Electrical activity was acquired at a sampling rate of 500Hz  and reviewed with a high frequency filter of 70Hz  and a low frequency filter of 1Hz . EEG data were recorded continuously and digitally stored.   Description: No posterior dominant rhythm was seen. EEG showed continuous generalized 3 to 6 Hz theta-delta slowing. Generalized periodic discharges with triphasic morphology at 1hz  were also noted. Physiologic photic driving was not seen during photic stimulation.  Hyperventilation  Was not performed.     ABNORMALITY -Continuous slow, generalized -Periodic discharges with triphasic morphology, generalized  IMPRESSION: This study is suggestive of moderate diffuse encephalopathy, nonspecific etiology. Given the morphology and frequency of periodic discharges with triphasic morphology are most likely due to toxic-metabolic causes. No seizures or definite epileptiform discharges were seen throughout the recording.  Salimatou Simone Barbra Sarks

## 2020-08-28 NOTE — Consult Note (Signed)
NAME: Stacy Pearson  DOB: 07-Nov-1960  MRN: [id number]  Date/Time: 08/28/2020 3:49 PM  REQUESTING PROVIDER: Dr.Amin Subjective:  REASON FOR CONSULT: meningits  No History available from patient- chart reviewed- cpuld not reach mother  ?Stacy Pearson is a 60 y.o.female with bipolar disorder,  with a history oAs per the EMS note pt was found to be in altered mental status when they brought he rin on 08/26/20. Pt lives with ehr mother- She has Bipolar, parkinsons disease, seizure disorder- Mother said to the EMS that patient once a month will sleep for 2 days after taking her bipolar meds and she thought she was doing the same from Friday 2/11 until Sunday 2/13- When she went to check on her on Sunday she found the patient obtunded and breathing was abnormal    Past Medical History:  Diagnosis Date  . Abdominal pain   . Allergic genetic state   . Anxiety   . Asthma   . Basal cell carcinoma   . Bipolar disorder ([place])   . Cancer ([place])    MOS surgery basal cell face  . COPD (chronic obstructive pulmonary disease) ([place])   . Depression   . Eating disorder   . Eczema   . Encounter for blood transfusion   . Essential tremor   . 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 GERD (gastroesophageal reflux disease)   . Headache   . History of basal cell carcinoma   . Hypertension   . Neuropathy   . Parkinson's disease ([place])   . Parkinson's disease ([place])   . Parkinsonism [facility])     Past Surgical History:  Procedure Laterality Date  . ABDOMINAL HYSTERECTOMY    . ADENOIDECTOMY    . BLADDER SURGERY    . bone grafts    . CATARACT EXTRACTION W/ INTRAOCULAR LENS IMPLANT    . CERVICAL FUSION    . COLONOSCOPY    . COLONOSCOPY WITH ESOPHAGOGASTRODUODENOSCOPY (EGD)    . COLONOSCOPY WITH PROPOFOL N/A 04/28/2018   Procedure: COLONOSCOPY WITH PROPOFOL;  Surgeon: Manya Silvas, MD;  Location: [facility] ENDOSCOPY;  Service: Endoscopy;  Laterality: N/A;  . EYE SURGERY     . GRAFT APPLICATION     in neck  . LAPAROSCOPIC BILATERAL SALPINGO OOPHERECTOMY    . NEUROPLASTY / TRANSPOSITION ULNAR NERVE AT ELBOW    . OOPHORECTOMY    . SKIN CANCER EXCISION    . skin grafts   left forearm; hand bil.lower extremeties  . titanium plate    . TONSILLECTOMY    . TUBAL LIGATION      Social History   Socioeconomic History  . Marital status: Single    Spouse name: Not on file  . Number of children: Not on file  . Years of education: Not on file  . Highest education level: Not on file  Occupational History  . Not on file  Tobacco Use  . Smoking status: Former Smoker    Types: Cigarettes    Quit date: 07/14/2006    Years since quitting: 14.1  . Smokeless tobacco: Never Used  Substance and Sexual Activity  . Alcohol use: Never  . Drug use: Never  . Sexual activity: Not on file  Other Topics Concern  . Not on file  Social History Narrative  . Not on file   Social Determinants of Health   Financial Resource Strain: Not on file  Food Insecurity: Not on file  Transportation Needs: Not on file  Physical Activity:  Not on file  Stress: Not on file  Social Connections: Not on file  Intimate Partner Violence: Not on file    Family History  Problem Relation Age of Onset  . Breast cancer Mother   . Lung cancer Father    Allergies  Allergen Reactions  . Codeine Nausea And Vomiting  . Prednisone    ? Current Facility-Administered Medications  Medication Dose Route Frequency Provider Last Rate Last Admin  . acetaminophen (TYLENOL) tablet 650 mg  650 mg Oral Q6H PRN Ivor Costa, MD       Or  . acetaminophen (TYLENOL) suppository 650 mg  650 mg Rectal Q6H PRN Ivor Costa, MD      . albuterol (PROVENTIL) (2.5 MG/3ML) 0.083% nebulizer solution 2.5 mg  2.5 mg Nebulization Q4H PRN Ivor Costa, MD      . carbidopa-levodopa (SINEMET IR) 25-100 MG per tablet immediate release 1 tablet  1 tablet Oral Daily Lorella Nimrod, MD      . carbidopa-levodopa (SINEMET IR) 25-100  MG per tablet immediate release 2 tablet  2 tablet Oral BID Lorella Nimrod, MD   2 tablet at 08/27/20 1417  . Carbidopa-Levodopa ER (SINEMET CR) 25-100 MG tablet controlled release 1 tablet  1 tablet Oral TID Oswald Hillock, RPH   1 tablet at 08/27/20 1229  . Carbidopa-Levodopa ER (SINEMET CR) 25-100 MG tablet controlled release 2 tablet  2 tablet Oral QHS Oswald Hillock, RPH   2 tablet at 08/27/20 2134  . ceFEPIme (MAXIPIME) 1 g in sodium chloride 0.9 % 100 mL IVPB  1 g Intravenous Q24H Berton Mount, [place]   Stopped at 08/28/20 7622  . [START ON 08/29/2020] Chlorhexidine Gluconate Cloth 2 % PADS 6 each  6 each Topical Q0600 Lorella Nimrod, MD      . cyanocobalamin ((VITAMIN B-12)) injection 1,000 mcg  1,000 mcg Intramuscular Once Donnetta Simpers, MD      . Derrill Memo ON 08/29/2020] vitamin B-12 (CYANOCOBALAMIN) tablet 1,000 mcg  1,000 mcg Oral Daily Donnetta Simpers, MD       Or  . Derrill Memo ON 08/29/2020] cyanocobalamin ((VITAMIN B-12)) injection 1,000 mcg  1,000 mcg Intramuscular Daily Donnetta Simpers, MD      . heparin injection 5,000 Units  5,000 Units Subcutaneous Q8H Oswald Hillock, RPH      . Ipratropium-Albuterol (COMBIVENT) respimat 1 puff  1 puff Inhalation Q6H Ivor Costa, MD      . lactated ringers infusion   Intravenous Continuous Lorella Nimrod, MD      . lamoTRIgine (LAMICTAL) tablet 25 mg  25 mg Oral BID Ivor Costa, MD   25 mg at 08/28/20 0900  . LORazepam (ATIVAN) injection 0.5 mg  0.5 mg Intravenous Q4H PRN Ivor Costa, MD      . LORazepam (ATIVAN) injection 1 mg  1 mg Intravenous Q6H Clapacs, John T, MD   1 mg at 08/28/20 0900  . LORazepam (ATIVAN) injection 2 mg  2 mg Intravenous Q2H PRN Ivor Costa, MD      . ondansetron [facility]) injection 4 mg  4 mg Intravenous Q8H PRN Ivor Costa, MD      . propranolol ER (INDERAL LA) 24 hr capsule 60 mg  60 mg Oral Daily Ivor Costa, MD   60 mg at 08/28/20 0900  . thiamine 500mg  in normal saline (34ml) IVPB  500 mg Intravenous Q8H Amin,  Soundra Pilon, MD 100 mL/hr at 08/28/20 1529 500 mg at 08/28/20 1529   Followed by  . [START ON 08/29/2020]  thiamine (B-1) 250 mg in sodium chloride 0.9 % 50 mL IVPB  250 mg Intravenous Daily Lorella Nimrod, MD       Followed by  . [START ON 09/04/2020] thiamine (B-1) injection 100 mg  100 mg Intravenous Daily Lorella Nimrod, MD      . vancomycin variable dose per unstable renal function (pharmacist dosing)   Does not apply See admin instructions Berton Mount, RPH         Abtx:  Anti-infectives (From admission, onward)   Start     Dose/Rate Route Frequency Ordered Stop   08/28/20 2000  acyclovir (ZOVIRAX) 640 mg in dextrose 5 % 100 mL IVPB  Status:  Discontinued        10 mg/kg  64.1 kg 112.8 mL/hr over 60 Minutes Intravenous Every 24 hours 08/28/20 0727 08/28/20 1005   08/28/20 2000  acyclovir (ZOVIRAX) 320 mg in dextrose 5 % 100 mL IVPB  Status:  Discontinued        5 mg/kg  64.1 kg 106.4 mL/hr over 60 Minutes Intravenous Every 24 hours 08/28/20 1005 08/28/20 1549   08/28/20 1000  vancomycin (VANCOREADY) IVPB 1000 mg/200 mL        1,000 mg 200 mL/hr over 60 Minutes Intravenous Every 48 hours 08/27/20 1927 08/28/20 1103   08/28/20 0600  ceFEPIme (MAXIPIME) 1 g in sodium chloride 0.9 % 100 mL IVPB  Status:  Discontinued        1 g 200 mL/hr over 30 Minutes Intravenous Every 24 hours 08/27/20 0850 08/27/20 0901   08/28/20 0600  cefTRIAXone (ROCEPHIN) 2 g in sodium chloride 0.9 % 100 mL IVPB  Status:  Discontinued        2 g 200 mL/hr over 30 Minutes Intravenous Every 24 hours 08/27/20 0901 08/27/20 1611   08/28/20 0600  ceFEPIme (MAXIPIME) 1 g in sodium chloride 0.9 % 100 mL IVPB        1 g 200 mL/hr over 30 Minutes Intravenous Every 24 hours 08/27/20 1623     08/27/20 2200  vancomycin (VANCOREADY) IVPB 750 mg/150 mL  Status:  Discontinued        750 mg 150 mL/hr over 60 Minutes Intravenous Every 48 hours 08/26/20 0816 08/27/20 0846   08/27/20 1800  acyclovir (ZOVIRAX) 640 mg in dextrose  5 % 100 mL IVPB  Status:  Discontinued        10 mg/kg  64.1 kg 112.8 mL/hr over 60 Minutes Intravenous Every 24 hours 08/27/20 1623 08/28/20 0727   08/27/20 1621  vancomycin variable dose per unstable renal function (pharmacist dosing)         Does not apply See admin instructions 08/27/20 1623     08/27/20 0600  ceFEPIme (MAXIPIME) 2 g in sodium chloride 0.9 % 100 mL IVPB  Status:  Discontinued        2 g 200 mL/hr over 30 Minutes Intravenous Every 24 hours 08/26/20 0816 08/27/20 0850   08/26/20 0615  vancomycin (VANCOREADY) IVPB 1500 mg/300 mL        1,500 mg 150 mL/hr over 120 Minutes Intravenous  Once 08/26/20 0602 08/26/20 1152   08/26/20 0600  ceFEPIme (MAXIPIME) 2 g in sodium chloride 0.9 % 100 mL IVPB        2 g 200 mL/hr over 30 Minutes Intravenous  Once 08/26/20 0555 08/26/20 0646   08/26/20 0600  metroNIDAZOLE (FLAGYL) IVPB 500 mg        500 mg 100 mL/hr over 60 Minutes Intravenous  Once 08/26/20 0555 08/26/20 0712   08/26/20 0600  vancomycin (VANCOCIN) IVPB 1000 mg/200 mL premix  Status:  Discontinued        1,000 mg 200 mL/hr over 60 Minutes Intravenous  Once 08/26/20 0555 08/26/20 0602      REVIEW OF SYSTEMS:  NA Objective:  VITALS:  BP (!) 145/95 (BP Location: Right Arm)   Pulse 83   Temp 98.1 F (36.7 C)   Resp 17   Ht 5\' 7"  (1.702 m)   Wt 66.8 kg   SpO2 92%   BMI 23.05 kg/m  PHYSICAL EXAM:  General: obtunded Head: Normocephalic, without obvious abnormality, atraumatic. Eyes: Conjunctivae clear, anicteric sclerae. Pupils are equal but small ENT cannot examine Neck: Supple, symmetrical, no adenopathy, thyroid: non tender no carotid bruit and no JVD. Lungs: b/l air entry Heart: s1s2 Abdomen: Soft, non-tender,not distended. Bowel sounds normal. No masses Extremities: atraumatic, no cyanosis. No edema. No clubbing Skin: small erythematous area over the rt knee medially  Neurologic: cannot assess  Pertinent Labs Lab Results CBC    Component Value  Date/Time   WBC 7.8 08/28/2020 0902   RBC 3.24 (L) 08/28/2020 0902   HGB 9.8 (L) 08/28/2020 0902   HGB 13.1 03/06/2013 2254   HGB 11.3 (L) 02/26/2006 1125   HCT 29.6 (L) 08/28/2020 0902   HCT 37.4 03/06/2013 2254   HCT 34.2 (L) 02/26/2006 1125   PLT 268 08/28/2020 0902   PLT 304 03/06/2013 2254   PLT 367 02/26/2006 1125   MCV 91.4 08/28/2020 0902   MCV 91 03/06/2013 2254   MCV 88 02/26/2006 1125   MCH 30.2 08/28/2020 0902   MCHC 33.1 08/28/2020 0902   RDW 14.5 08/28/2020 0902   RDW 12.7 03/06/2013 2254   RDW 13.7 02/26/2006 1125   LYMPHSABS 0.6 (L) 08/26/2020 0508   LYMPHSABS 2.2 02/26/2006 1125   MONOABS 0.9 08/26/2020 0508   EOSABS 0.0 08/26/2020 0508   EOSABS 0.2 02/26/2006 1125   BASOSABS 0.0 08/26/2020 0508   BASOSABS 0.1 02/26/2006 1125    CMP Latest Ref Rng & Units 08/28/2020 08/27/2020 08/26/2020  Glucose 70 - 99 mg/dL 120(H) 75 148(H)  BUN 6 - 20 mg/dL 87(H) 63(H) 43(H)  Creatinine 0.44 - 1.00 mg/dL 6.92(H) 5.55(H) 3.53(H)  Sodium 135 - 145 mmol/L 144 145 146(H)  Potassium 3.5 - 5.1 mmol/L 3.9 5.4(H) 4.8  Chloride 98 - 111 mmol/L 105 110 112(H)  CO2 22 - 32 mmol/L 23 17(L) 12(L)  Calcium 8.9 - 10.3 mg/dL 8.5(L) 9.2 9.5  Total Protein 6.5 - 8.1 g/dL - - 9.0(H)  Total Bilirubin 0.3 - 1.2 mg/dL - - 0.5  Alkaline Phos 38 - 126 U/L - - 112  AST 15 - 41 U/L - - 46(H)  ALT 0 - 44 U/L - - 20      Microbiology: Recent Results (from the past 240 hour(s))  Resp Panel by RT-PCR (Flu A&B, Covid) Urine, Clean Catch     Status: None   Collection Time: 08/26/20  5:08 AM   Specimen: Urine, Clean Catch; Nasopharyngeal(NP) swabs in vial transport medium  Result Value Ref Range Status   SARS Coronavirus 2 by RT PCR NEGATIVE NEGATIVE Final    Comment: (NOTE) SARS-CoV-2 target nucleic acids are NOT DETECTED.  The SARS-CoV-2 RNA is generally detectable in upper respiratory specimens during the acute phase of infection. The lowest concentration of SARS-CoV-2 viral copies this  assay can detect is 138 copies/mL. A negative result does not preclude SARS-Cov-2 infection  and should not be used as the sole basis for treatment or other patient management decisions. A negative result may occur with  improper specimen collection/handling, submission of specimen other than nasopharyngeal swab, presence of viral mutation(s) within the areas targeted by this assay, and inadequate number of viral copies(<138 copies/mL). A negative result must be combined with clinical observations, patient history, and epidemiological information. The expected result is Negative.  Fact Sheet for Patients:  EntrepreneurPulse.com.au  Fact Sheet for Healthcare Providers:  IncredibleEmployment.be  This test is no t yet approved or cleared by the [place] FDA and  has been authorized for detection and/or diagnosis of SARS-CoV-2 by FDA under an Emergency Use Authorization (EUA). This EUA will remain  in effect (meaning this test can be used) for the duration of the COVID-19 declaration under Section 564(b)(1) of the Act, 21 U.S.C.section 360bbb-3(b)(1), unless the authorization is terminated  or revoked sooner.       Influenza A by PCR NEGATIVE NEGATIVE Final   Influenza B by PCR NEGATIVE NEGATIVE Final    Comment: (NOTE) The Xpert Xpress SARS-CoV-2/FLU/RSV plus assay is intended as an aid in the diagnosis of influenza from Nasopharyngeal swab specimens and should not be used as a sole basis for treatment. Nasal washings and aspirates are unacceptable for Xpert Xpress SARS-CoV-2/FLU/RSV testing.  Fact Sheet for Patients: EntrepreneurPulse.com.au  Fact Sheet for Healthcare Providers: IncredibleEmployment.be  This test is not yet approved or cleared by the [place] FDA and has been authorized for detection and/or diagnosis of SARS-CoV-2 by FDA under an Emergency Use Authorization (EUA). This EUA will  remain in effect (meaning this test can be used) for the duration of the COVID-19 declaration under Section 564(b)(1) of the Act, 21 U.S.C. section 360bbb-3(b)(1), unless the authorization is terminated or revoked.  Performed at [facility], [address], [place], [place] [address]   Urine culture     Status: None   Collection Time: 08/26/20  5:54 AM   Specimen: Urine, Clean Catch  Result Value Ref Range Status   Specimen Description   Final    URINE, CLEAN CATCH Performed at [facility], [address], [place], [place] [address]    Special Requests   Final    NONE Performed at [facility], [address], [place], [place] [address]    Culture   Final    NO GROWTH Performed at [facility], [place] [address], [place], [place] [address]    Report Status 08/27/2020 FINAL  Final  Culture, blood (routine x 2)     Status: None (Preliminary result)   Collection Time: 08/26/20  6:03 AM   Specimen: Left Antecubital; Blood  Result Value Ref Range Status   Specimen Description LEFT ANTECUBITAL  Final   Special Requests   Final    BOTTLES DRAWN AEROBIC AND ANAEROBIC Blood Culture adequate volume   Culture   Final    NO GROWTH 2 DAYS Performed at [facility], [address] [place], [place] [address]    Report Status PENDING  Incomplete  Culture, blood (routine x 2)     Status: None (Preliminary result)   Collection Time: 08/26/20  8:55 AM   Specimen: BLOOD LEFT FOREARM  Result Value Ref Range Status   Specimen Description BLOOD LEFT FOREARM  Final   Special Requests   Final    BOTTLES DRAWN AEROBIC AND ANAEROBIC Blood Culture adequate volume   Culture   Final    NO  GROWTH 2 DAYS Performed at [facility], [address] [place], [place] [address]    Report Status PENDING  Incomplete  CSF culture w Gram Stain     Status: None (Preliminary result)   Collection Time: 08/28/20 12:01 PM   Specimen: PATH Cytology CSF;  Cerebrospinal Fluid  Result Value Ref Range Status   Specimen Description   Final    CSF Performed at [facility], [address] [place], [place] [address]    Special Requests   Final    NONE Performed at [facility], [place] [address] [place], [place] [address]    Gram Stain   Final    WBC SEEN RED BLOOD CELLS NO ORGANISMS SEEN Performed at [facility], [address] [place], [place] [address]    Culture PENDING  Incomplete   Report Status PENDING  Incomplete  Culture, fungus without smear     Status: None (Preliminary result)   Collection Time: 08/28/20 12:01 PM   Specimen: PATH Cytology CSF; Cerebrospinal Fluid  Result Value Ref Range Status   Specimen Description   Final    CSF Performed at [facility], [address], [place], [place] [address]    Special Requests   Final    NONE Performed at [facility], [place] [address] [place],  [address]    Culture PENDING  Incomplete   Report Status PENDING  Incomplete    IMAGING RESULTS:  I have personally reviewed the films ? Impression/Recommendation ? ?unclear picture, not able to reach her mother to get history Acute encephalopathy, AKI, leucocytosis,lactic acidosis in a patient who has bipolar disorder, parkinsons on sinemet CSF 39 wbc and 10 rbc does not favor herpes encephalitis, even if the LP was done after 1 dose of acyclovir. EEG no PLEDS, CT SCAN without contrast  Shows no temporal lobe findings eventhough MRI more sensitive for herpes. especially with AKI  caution regarding nephrotoxic drug administration, await PCR and if neg DC acyclovir D.D infectious VS non infectious   meningitis  VS seizures VS neuroleptic malignant syndrome due to her meds( but no fever)    Pt is on scheduled ativan which can keep her somnolent and obtunded especially with underlying AKI Also got haldol 2 days ago  Will  need dialysis especially because of persistent encephalopathy  and polypharmacy  ?Discussed with her nurse Unable to reach her mother Note:  This document was prepared using Dragon voice recognition software and may include unintentional dictation errors.

## 2020-08-28 NOTE — Progress Notes (Signed)
Results for LUNETTE, TAPP (MRN [id number]) as of 08/28/2020 13:57  Ref. Range 08/28/2020 12:01  WBC, CSF Latest Ref Range: 0 - 5 /cu mm 39 (HH)    Dr. Reesa Chew notified via secure chat.

## 2020-08-28 NOTE — Procedures (Signed)
Lumbar Puncture Procedure Note  TEXIE TUPOU  [id number]  September 23, 1960  Date:08/28/20  Time:12:31 PM   Provider Performing:[place] Inthavong Posey Pronto   Procedure: Fluoroscopic Guided Lumbar Puncture   Indication(s) Rule out meningitis  Consent Risks of the procedure as well as the alternatives and risks of each were explained to the patient and/or caregiver.  Consent for the procedure was obtained and is signed in the bedside chart  Anesthesia Topical only with 1% lidocaine    Time Out Verified patient identification, verified procedure, site/side was marked, verified correct patient position, special equipment/implants available, medications/allergies/relevant history reviewed, required imaging and test results available.   Sterile Technique Maximal sterile technique including sterile barrier drape, hand hygiene, sterile gown, sterile gloves, mask, hair covering.    Procedure Description Lidocaine used to anesthetize skin and subcutaneous tissue overlying this area.  A 22g spinal needle was then used to access the subarachnoid space at the L4-L5 level. Opening pressure:Not obtained. Closing pressure:Not obtained. Clear CSF obtained. Refer to PACS for dictation.   Complications/Tolerance None; patient tolerated the procedure well.   EBL Minimal   Specimen(s) CSF

## 2020-08-28 NOTE — Progress Notes (Signed)
Brief Neuro Update:  Labs with low normal Vit B12 levels. Vit B12 replacement ordered. LP with pleocytosis with WBC count of 39 with a RBC count of 10. LP findings are confounded by the fact that she was started on Acyclovir, Cefepime, and Vancomycin a day before her LP. Continue broad spectrum coverage. Will get ID team on board to assist with Antimicrobial management and help with de-escalation.  Recs: - EEG is pending. - I ordered MRI Brain without contrast. - Continue Thiamine high dose replacement until thiamine levels are back.  [place] Pager Number [phone]

## 2020-08-28 NOTE — Progress Notes (Signed)
[place], [place] 08/28/20  Subjective:  Stacy Pearson is a 60 y.o. caucasian female with PMHX of bipolar, depression, anxiety, parkinson's, epilepsy, HTN, and COPD. She presents to the ED with AMS.   Dx of severe sepsis with septic shock due to possible UTI.   Patient was seen today resting quietly in bed. She was arousable, but incoherent with responses to questions  [id number] via straight cath  Objective:  Vital signs in last 24 hours:  Temp:  [97.6 F (36.4 C)-98.1 F (36.7 C)] 98.1 F (36.7 C) (02/15 1537) Pulse Rate:  [68-83] 83 (02/15 1537) Resp:  [16-20] 17 (02/15 1537) BP: (128-146)/(64-95) 145/95 (02/15 1537) SpO2:  [92 %-100 %] 92 % (02/15 1537) Weight:  [66.8 kg] 66.8 kg (02/15 0245)  Weight change: 2.631 kg Filed Weights   08/26/20 0458 08/27/20 0100 08/28/20 0245  Weight: 61.6 kg 64.1 kg 66.8 kg    Intake/Output:    Intake/Output Summary (Last 24 hours) at 08/28/2020 1628 Last data filed at 08/28/2020 7673 Gross per 24 hour  Intake 2044.71 ml  Output --  Net 2044.71 ml    Physical Exam: General: Ill appearing, NAD  Head: Dry oral mucosal membranes  Eyes: Anicteric  Neck: Supple, trachea midline  Lungs:  Clear to auscultation  Heart: Regular rate and rhythm  Abdomen:  Soft, nontender,   Extremities:  no peripheral edema.  Neurologic: Not following commands, incoherent speech  Skin: No lesions  Access: none   Basic Metabolic Panel:  Recent Labs  Lab 08/26/20 0508 08/27/20 0643 08/28/20 0902  NA 146* 145 144  K 4.8 5.4* 3.9  CL 112* 110 105  CO2 12* 17* 23  GLUCOSE 148* 75 120*  BUN 43* 63* 87*  CREATININE 3.53* 5.55* 6.92*  CALCIUM 9.5 9.2 8.5*  PHOS  --   --  5.8*     CBC: Recent Labs  Lab 08/26/20 0508 08/27/20 0643 08/28/20 0902  WBC 18.3* 15.3* 7.8  NEUTROABS 16.7*  --   --   HGB 14.9 11.8* 9.8*  HCT 46.0 35.2* 29.6*  MCV 94.7 92.6 91.4  PLT 475* 281 268     No results found for:  HEPBSAG, HEPBSAB, HEPBIGM    Microbiology:  Recent Results (from the past 240 hour(s))  Resp Panel by RT-PCR (Flu A&B, Covid) Urine, Clean Catch     Status: None   Collection Time: 08/26/20  5:08 AM   Specimen: Urine, Clean Catch; Nasopharyngeal(NP) swabs in vial transport medium  Result Value Ref Range Status   SARS Coronavirus 2 by RT PCR NEGATIVE NEGATIVE Final    Comment: (NOTE) SARS-CoV-2 target nucleic acids are NOT DETECTED.  The SARS-CoV-2 RNA is generally detectable in upper respiratory specimens during the acute phase of infection. The lowest concentration of SARS-CoV-2 viral copies this assay can detect is 138 copies/mL. A negative result does not preclude SARS-Cov-2 infection and should not be used as the sole basis for treatment or other patient management decisions. A negative result may occur with  improper specimen collection/handling, submission of specimen other than nasopharyngeal swab, presence of viral mutation(s) within the areas targeted by this assay, and inadequate number of viral copies(<138 copies/mL). A negative result must be combined with clinical observations, patient history, and epidemiological information. The expected result is Negative.  Fact Sheet for Patients:  EntrepreneurPulse.com.au  Fact Sheet for Healthcare Providers:  IncredibleEmployment.be  This test is no t yet approved or cleared by the [place] FDA and  has been  authorized for detection and/or diagnosis of SARS-CoV-2 by FDA under an Emergency Use Authorization (EUA). This EUA will remain  in effect (meaning this test can be used) for the duration of the COVID-19 declaration under Section 564(b)(1) of the Act, 21 U.S.C.section 360bbb-3(b)(1), unless the authorization is terminated  or revoked sooner.       Influenza A by PCR NEGATIVE NEGATIVE Final   Influenza B by PCR NEGATIVE NEGATIVE Final    Comment: (NOTE) The Xpert Xpress  SARS-CoV-2/FLU/RSV plus assay is intended as an aid in the diagnosis of influenza from Nasopharyngeal swab specimens and should not be used as a sole basis for treatment. Nasal washings and aspirates are unacceptable for Xpert Xpress SARS-CoV-2/FLU/RSV testing.  Fact Sheet for Patients: EntrepreneurPulse.com.au  Fact Sheet for Healthcare Providers: IncredibleEmployment.be  This test is not yet approved or cleared by the [place] FDA and has been authorized for detection and/or diagnosis of SARS-CoV-2 by FDA under an Emergency Use Authorization (EUA). This EUA will remain in effect (meaning this test can be used) for the duration of the COVID-19 declaration under Section 564(b)(1) of the Act, 21 U.S.C. section 360bbb-3(b)(1), unless the authorization is terminated or revoked.  Performed at [facility], [address], [place], [place] [address]   Urine culture     Status: None   Collection Time: 08/26/20  5:54 AM   Specimen: Urine, Clean Catch  Result Value Ref Range Status   Specimen Description   Final    URINE, CLEAN CATCH Performed at [facility], [address] [place], [place] [address]    Special Requests   Final    NONE Performed at [facility], [address] [place], [place] [address]    Culture   Final    NO GROWTH Performed at [facility], [place] [address] [place], [place] [address]    Report Status 08/27/2020 FINAL  Final  Culture, blood (routine x 2)     Status: None (Preliminary result)   Collection Time: 08/26/20  6:03 AM   Specimen: Left Antecubital; Blood  Result Value Ref Range Status   Specimen Description LEFT ANTECUBITAL  Final   Special Requests   Final    BOTTLES DRAWN AEROBIC AND ANAEROBIC Blood Culture adequate volume   Culture   Final    NO GROWTH 2 DAYS Performed at [facility], [address] [place], [place] [address]    Report Status PENDING   Incomplete  Culture, blood (routine x 2)     Status: None (Preliminary result)   Collection Time: 08/26/20  8:55 AM   Specimen: BLOOD LEFT FOREARM  Result Value Ref Range Status   Specimen Description BLOOD LEFT FOREARM  Final   Special Requests   Final    BOTTLES DRAWN AEROBIC AND ANAEROBIC Blood Culture adequate volume   Culture   Final    NO GROWTH 2 DAYS Performed at [facility], [address], [place], [place] [address]    Report Status PENDING  Incomplete  CSF culture w Gram Stain     Status: None (Preliminary result)   Collection Time: 08/28/20 12:01 PM   Specimen: PATH Cytology CSF; Cerebrospinal Fluid  Result Value Ref Range Status   Specimen Description   Final    CSF Performed at [facility], [address] [place],  [address]    Special Requests   Final    NONE Performed at [facility], [place] [address]  [place], [place] [address]    Gram Stain   Final    WBC SEEN RED BLOOD CELLS NO ORGANISMS SEEN Performed at [facility], [address] [place], [place] [address]    Culture PENDING  Incomplete   Report Status PENDING  Incomplete  Culture, fungus without smear     Status: None (Preliminary result)   Collection Time: 08/28/20 12:01 PM   Specimen: PATH Cytology CSF; Cerebrospinal Fluid  Result Value Ref Range Status   Specimen Description   Final    CSF Performed at [facility], [address] [place], [place] [address]    Special Requests   Final    NONE Performed at [facility], [place] [address], [place], [place] [address]    Culture PENDING  Incomplete   Report Status PENDING  Incomplete    Coagulation Studies: Recent Labs    08/28/20 0902  LABPROT 13.3  INR 1.1    Urinalysis: Recent Labs    08/26/20 0508  COLORURINE YELLOW*  LABSPEC 1.009  PHURINE 5.0  GLUCOSEU 50*  HGBUR LARGE*  BILIRUBINUR NEGATIVE  KETONESUR NEGATIVE  PROTEINUR 100*  NITRITE NEGATIVE  LEUKOCYTESUR  NEGATIVE      Imaging: EEG  Result Date: 08/28/2020 Lora Havens, MD     08/28/2020  3:40 PM Patient Name: NIANA MARTORANA MRN: [id number] Epilepsy Attending: Lora Havens Referring Physician/Provider: Dr Malvin Johns Date: 08/28/2020 Duration: 26.12 mins Patient history: 59yo F with ams. EEG to evaluate for seizure Level of alertness: Awake AEDs during EEG study: LTG, LRZ Technical aspects: This EEG study was done with scalp electrodes positioned according to the 10-20 International system of electrode placement. Electrical activity was acquired at a sampling rate of 500Hz  and reviewed with a high frequency filter of 70Hz  and a low frequency filter of 1Hz . EEG data were recorded continuously and digitally stored. Description: No posterior dominant rhythm was seen. EEG showed continuous generalized 3 to 6 Hz theta-delta slowing. Generalized periodic discharges with triphasic morphology at 1hz  were also noted. Physiologic photic driving was not seen during photic stimulation.  Hyperventilation  Was not performed.   ABNORMALITY -Continuous slow, generalized -Periodic discharges with triphasic morphology, generalized IMPRESSION: This study is suggestive of moderate diffuse encephalopathy, nonspecific etiology. Given the morphology and frequency of periodic discharges with triphasic morphology are most likely due to toxic-metabolic causes. No seizures or definite epileptiform discharges were seen throughout the recording. Melissa   DG FLUORO GUIDED LOC OF NEEDLE/CATH TIP FOR SPINAL INJECT RT  Result Date: 08/28/2020 CLINICAL DATA:  Altered mental status EXAM: DIAGNOSTIC LUMBAR PUNCTURE UNDER FLUOROSCOPIC GUIDANCE FLUOROSCOPY TIME:  Fluoroscopy Time:  18 seconds Radiation Exposure Index (if provided by the fluoroscopic device): 3.8 mGy PROCEDURE: Informed consent was obtained from the patient prior to the procedure, including potential complications of headache, allergy, and pain. With the  patient prone, the lower back was prepped with Betadine. 1% Lidocaine was used for local anesthesia. Lumbar puncture was performed at the L4-L5 level using a 22 gauge needle with return of clear CSF. 10 ml of CSF were obtained for laboratory studies. The patient tolerated the procedure well and there were no apparent complications. IMPRESSION: Technically successful fluoroscopy guided lumbar puncture. Electronically Signed   By: Macy Mis M.D.   On: 08/28/2020 12:35     Medications:    ceFEPime (MAXIPIME) IV Stopped (08/28/20 3614)   lactated ringers 75 mL/hr at 08/28/20 1608   thiamine injection 500 mg (08/28/20  1529)   Followed by   Derrill Memo ON 08/29/2020] thiamine injection      carbidopa-levodopa  1 tablet Oral Daily   carbidopa-levodopa  2 tablet Oral BID   Carbidopa-Levodopa ER  1 tablet Oral TID   Carbidopa-Levodopa ER  2 tablet Oral QHS   [START ON 08/29/2020] Chlorhexidine Gluconate Cloth  6 each Topical Q0600   cyanocobalamin  1,000 mcg Intramuscular Once   [START ON 08/29/2020] vitamin B-12  1,000 mcg Oral Daily   Or   [START ON 08/29/2020] cyanocobalamin  1,000 mcg Intramuscular Daily   heparin injection (subcutaneous)  5,000 Units Subcutaneous Q8H   Ipratropium-Albuterol  1 puff Inhalation Q6H   lamoTRIgine  25 mg Oral BID   LORazepam  0.5 mg Intravenous Q8H   propranolol ER  60 mg Oral Daily   [START ON 09/04/2020] thiamine injection  100 mg Intravenous Daily   vancomycin variable dose per unstable renal function (pharmacist dosing)   Does not apply See admin instructions   acetaminophen **OR** acetaminophen, albuterol, LORazepam, LORazepam, ondansetron (ZOFRAN) IV  Assessment/ Plan:  60 y.o. female with PMHX of bipolar, depression, anxiety, parkinson's, epilepsy, HTN, and COPD was admitted on 08/26/2020   Principal Problem:   Delirium Active Problems:   Bipolar 1 disorder (HCC)   COPD (chronic obstructive pulmonary disease) (HCC)   Essential  tremor   Migraine without aura   Parkinson's disease ([place])   Epilepsy ([place])   Depression with anxiety   HTN (hypertension)   Acute metabolic encephalopathy   UTI (urinary tract infection)   AKI (acute kidney injury) ([place])   Severe sepsis with septic shock (HCC)  Confusion [R41.0] Severe sepsis with septic shock ([place]) [A41.9, R65.21] Severe sepsis ([place]) [A41.9, R65.20] Altered mental status, unspecified altered mental status type [R41.82] Sepsis, due to unspecified organism, unspecified whether acute organ dysfunction present ([place]) [A41.9]  #. Acute kidney injury Likely due to azotemia IVF ordered at [facility] @75  ml/hr Creatinine on admission 3.53 Currently Crt-6.92 BUN 43 on admission Currently BUN 87  #. Hyperkalemia -Currently K-3.9 Will continue to monitor   #. Hypernatremia with history of hyponatremia Currently Na-144 Continuing to monitor   #. Metabolic acidosis Managed by primary team   LOS: 2 Colon Flattery 2/15/20224:28 PM  [facility] [place], [place]

## 2020-08-28 NOTE — Progress Notes (Signed)
PROGRESS NOTE    Stacy Pearson  YWV:[id number] DOB: 1960/11/02 DOA: 08/26/2020 PCP: Valera Castle, MD   Brief Narrative: Taken from H&P. Stacy Pearson is a 60 y.o. female with medical history significant of bipolar, depression, anxiety, Parkinson, epilepsy, HTN, COPD, presents with AMS. On presentation patient was confused and agitated.  Admitting provider was able to contact her mother who is the only contact listed, and according to her she lives alone and was at her normal state of health until Friday afternoon. She was sleeping most of the time since then and found to be more confused and somnolent and EMS was called. Mother was not aware of any recent illnesses. On presentation she was found to have neutrophilic predominant leukocytosis, lactic acidosis at 6.7, Covid PCR negative, AKI, tachycardic and tachypneic.  Chest x-ray with low lung volumes and no infiltrate.  CT head was negative.  Lactic acidosis started improving, worsening renal function and hyperkalemia next morning.  Procalcitonin elevated at 1.77.  Blood and urine cultures negative.  Remained very altered, unable to provide any history. Worsening renal function, nephrology is involved. Neurology was consulted with persistent altered mental status, LP was done and initial labs concerning for intracranial/meningeal infection.  Patient is currently on broad spectrum antibiotics.  ID was also consulted. Pending final culture results, VDRL CSF and HSV PCR.  Unable to reach mother, only listed contact, unknown baseline.  Subjective: Patient was lying with eyes close and did not open up on command.  Withdrawing with noxious stimuli.  Did not talk at all.  She was able to take her meds with nursing staff and did eat some.  Assessment & Plan:   Principal Problem:   Delirium Active Problems:   Bipolar 1 disorder (HCC)   COPD (chronic obstructive pulmonary disease) (HCC)   Essential tremor   Migraine without  aura   Parkinson's disease ([place])   Epilepsy ([place])   Depression with anxiety   HTN (hypertension)   Acute metabolic encephalopathy   UTI (urinary tract infection)   AKI (acute kidney injury) ([place])   Severe sepsis with septic shock (HCC)  Altered mental status.  Initially met severe sepsis criteria with leukocytosis, tachycardic, tachypneic, lactic acidosis, cultures remain negative,  Initial CT head negative.  Procalcitonin at 1.77.  Worsening renal function. Patient is having quite broad differential at this time which include CNS infection, polypharmacy, drug overdose. -Neurology consult to rule out any CNS infection, LP was done which was concerning for meningitis on initial labs, culture and further labs are pending. -UDS-Zosyn tricyclics -Checked CK -854 -Salicylates and acetaminophen levels-within normal limit. -Neurology restarted broad-spectrum antibiotics. -ID was also consulted after seeing preliminary LP results. -Keep holding sedative medications.  AKI.  Worsening renal function.  Baseline creatinine below 1 Nephrology was consulted-appreciate their help. CT stone studies without any significant abnormalities except a hypodensity in the bladder wall which can represent some bleed without any wall thickness. UA was positive for RBCs, urine cultures negative. Nephrology is recommending continue with IV fluid-some concern of diabetes insipidus with her underlying psych issues and meds. There was some concern of urinary retention yesterday and we did in and out catheter.  Foley will be placed today due to worsening renal function with strict intake and output record. -Monitor renal function -Avoid nephrotoxins  Hyperkalemia.  Resolved today with Lokelma.  Most likely secondary to worsening renal function.  Concern of renal tubular acidosis. -Monitor potassium.  Anion gap metabolic acidosis.  Most likely secondary to  lactic acidosis. Elevated CK so rhabdomyolysis can be  playing a role. -Nephrology started her on bicarb infusion.  Bipolar 1 disorder with depression and anxiety.  Psych was consulted from ED, they are recommending continuation of benzos with Ativan as she was taking very high dose at baseline to prevent any withdrawal. -Continue with Ativan -Keep holding rest of her sedating medications.  COPD.  Appears stable, no wheezing and chest x-ray without any acute abnormality. -As needed bronchodilators  History of Parkinson's disease.  Unknown baseline -Continue with home dose of Sinemet. -Continue with propranolol-do not know whether tremors are essential or secondary to underlying Parkinson's.  History of migraine. -Continue as needed Tylenol  History of seizure disorder ??  No witnessed seizure-like activity. -Continue home dose of Lamictal-not sure whether she was taking it for seizures. -Continue with Ativan as needed. -EEG-done pending results  Hypertension.  Blood pressure within goal. On home propranolol. -Can do as needed hydralazine.   Objective: Vitals:   08/27/20 1500 08/27/20 1945 08/28/20 0245 08/28/20 0757  BP: (!) 111/57 128/64 (!) 146/82 135/80  Pulse: 69 68 72 79  Resp: _0 Temp: 98.3 F (36.8 C) 97.8 F (36.6 C) 97.6 F (36.4 C) 98.1 F (36.7 C)  TempSrc: Oral  Oral   SpO2:  99% 100% 97%  Weight:   66.8 kg   Height:        Intake/Output Summary (Last 24 hours) at 08/28/2020 1418 Last data filed at 08/28/2020 4656 Gross per 24 hour  Intake 2044.71 ml  Output 600 ml  Net 1444.71 ml   Filed Weights   08/26/20 0458 08/27/20 0100 08/28/20 0245  Weight: 61.6 kg 64.1 kg 66.8 kg    Examination:  General.  Chronically ill-appearing lady, in no acute distress, responding to noxious stimuli, not following any command Pulmonary.  Lungs clear bilaterally, normal respiratory effort. CV.  Regular rate and rhythm, no JVD, rub or murmur. Abdomen.  Soft, nontender, nondistended, BS positive. CNS.  Patient  only responding to noxious stimuli by withdrawing the limbs, not following any commands. Extremities.  No edema, no cyanosis, pulses intact and symmetrical. Psychiatry.  Judgment and insight appears impaired  DVT prophylaxis: Lovenox Code Status: DNR-admitting provider confirmed with mother Family Communication: Called mother again today with no response. Disposition Plan:  Status is: Inpatient  Remains inpatient appropriate because:Inpatient level of care appropriate due to severity of illness   Dispo: The patient is from: Home              Anticipated d/c is to: To be determined              Anticipated d/c date is: 3 days              Patient currently is not medically stable to d/c.   Difficult to place patient Yes              Level of care: Progressive Cardiac  Consultants:   Psychiatry  Nephrology  Neurology  ID  Procedures:  Antimicrobials:  Vancomycin Cefepime   acyclovir  Data Reviewed: I have personally reviewed following labs and imaging studies  CBC: Recent Labs  Lab 08/26/20 0508 08/27/20 0643 08/28/20 0902  WBC 18.3* 15.3* 7.8  NEUTROABS 16.7*  --   --   HGB 14.9 11.8* 9.8*  HCT 46.0 35.2* 29.6*  MCV 94.7 92.6 91.4  PLT 475* 281 812   Basic Metabolic Panel: Recent Labs  Lab 08/26/20 0508 08/27/20 7517  08/28/20 0902  NA 146* 145 144  K 4.8 5.4* 3.9  CL 112* 110 105  CO2 12* 17* 23  GLUCOSE 148* 75 120*  BUN 43* 63* 87*  CREATININE 3.53* 5.55* 6.92*  CALCIUM 9.5 9.2 8.5*  PHOS  --   --  5.8*   GFR: Estimated Creatinine Clearance: 8.5 mL/min (A) (by C-G formula based on SCr of 6.92 mg/dL (H)). Liver Function Tests: Recent Labs  Lab 08/26/20 0508 08/28/20 0902  AST 46*  --   ALT 20  --   ALKPHOS 112  --   BILITOT 0.5  --   PROT 9.0*  --   ALBUMIN 4.5 2.9*   No results for input(s): LIPASE, AMYLASE in the last 168 hours. Recent Labs  Lab 08/27/20 1557  AMMONIA 28   Coagulation Profile: Recent Labs  Lab 08/28/20 0902   INR 1.1   Cardiac Enzymes: Recent Labs  Lab 08/27/20 0911  CKTOTAL 364*   BNP (last 3 results) No results for input(s): PROBNP in the last 8760 hours. HbA1C: No results for input(s): HGBA1C in the last 72 hours. CBG: No results for input(s): GLUCAP in the last 168 hours. Lipid Profile: No results for input(s): CHOL, HDL, LDLCALC, TRIG, CHOLHDL, LDLDIRECT in the last 72 hours. Thyroid Function Tests: Recent Labs    08/27/20 1557  TSH 1.155   Anemia Panel: Recent Labs    08/27/20 1557  VITAMINB12 221  FOLATE 31.0   Sepsis Labs: Recent Labs  Lab 08/26/20 0508 08/26/20 0640 08/26/20 1030 08/26/20 1451 08/27/20 1235 08/27/20 1510  PROCALCITON 1.77  --   --   --   --   --   LATICACIDVEN  --    < > 2.3* 2.8* 3.0* 2.1*   < > = values in this interval not displayed.    Recent Results (from the past 240 hour(s))  Resp Panel by RT-PCR (Flu A&B, Covid) Urine, Clean Catch     Status: None   Collection Time: 08/26/20  5:08 AM   Specimen: Urine, Clean Catch; Nasopharyngeal(NP) swabs in vial transport medium  Result Value Ref Range Status   SARS Coronavirus 2 by RT PCR NEGATIVE NEGATIVE Final    Comment: (NOTE) SARS-CoV-2 target nucleic acids are NOT DETECTED.  The SARS-CoV-2 RNA is generally detectable in upper respiratory specimens during the acute phase of infection. The lowest concentration of SARS-CoV-2 viral copies this assay can detect is 138 copies/mL. A negative result does not preclude SARS-Cov-2 infection and should not be used as the sole basis for treatment or other patient management decisions. A negative result may occur with  improper specimen collection/handling, submission of specimen other than nasopharyngeal swab, presence of viral mutation(s) within the areas targeted by this assay, and inadequate number of viral copies(<138 copies/mL). A negative result must be combined with clinical observations, patient history, and epidemiological information.  The expected result is Negative.  Fact Sheet for Patients:  EntrepreneurPulse.com.au  Fact Sheet for Healthcare Providers:  IncredibleEmployment.be  This test is no t yet approved or cleared by the [place] FDA and  has been authorized for detection and/or diagnosis of SARS-CoV-2 by FDA under an Emergency Use Authorization (EUA). This EUA will remain  in effect (meaning this test can be used) for the duration of the COVID-19 declaration under Section 564(b)(1) of the Act, 21 U.S.C.section 360bbb-3(b)(1), unless the authorization is terminated  or revoked sooner.       Influenza A by PCR NEGATIVE NEGATIVE Final   Influenza  B by PCR NEGATIVE NEGATIVE Final    Comment: (NOTE) The Xpert Xpress SARS-CoV-2/FLU/RSV plus assay is intended as an aid in the diagnosis of influenza from Nasopharyngeal swab specimens and should not be used as a sole basis for treatment. Nasal washings and aspirates are unacceptable for Xpert Xpress SARS-CoV-2/FLU/RSV testing.  Fact Sheet for Patients: EntrepreneurPulse.com.au  Fact Sheet for Healthcare Providers: IncredibleEmployment.be  This test is not yet approved or cleared by the [place] FDA and has been authorized for detection and/or diagnosis of SARS-CoV-2 by FDA under an Emergency Use Authorization (EUA). This EUA will remain in effect (meaning this test can be used) for the duration of the COVID-19 declaration under Section 564(b)(1) of the Act, 21 U.S.C. section 360bbb-3(b)(1), unless the authorization is terminated or revoked.  Performed at [facility], [address], [place], [place] [address]   Urine culture     Status: None   Collection Time: 08/26/20  5:54 AM   Specimen: Urine, Clean Catch  Result Value Ref Range Status   Specimen Description   Final    URINE, CLEAN CATCH Performed at [facility], [address]  [place], [place] [address]    Special Requests   Final    NONE Performed at [facility], [address], [place], [place] [address]    Culture   Final    NO GROWTH Performed at [facility], [place] [address], [place], [place] [address]    Report Status 08/27/2020 FINAL  Final  Culture, blood (routine x 2)     Status: None (Preliminary result)   Collection Time: 08/26/20  6:03 AM   Specimen: Left Antecubital; Blood  Result Value Ref Range Status   Specimen Description LEFT ANTECUBITAL  Final   Special Requests   Final    BOTTLES DRAWN AEROBIC AND ANAEROBIC Blood Culture adequate volume   Culture   Final    NO GROWTH 2 DAYS Performed at [facility], [address], [place], [place] [address]    Report Status PENDING  Incomplete  Culture, blood (routine x 2)     Status: None (Preliminary result)   Collection Time: 08/26/20  8:55 AM   Specimen: BLOOD LEFT FOREARM  Result Value Ref Range Status   Specimen Description BLOOD LEFT FOREARM  Final   Special Requests   Final    BOTTLES DRAWN AEROBIC AND ANAEROBIC Blood Culture adequate volume   Culture   Final    NO GROWTH 2 DAYS Performed at [facility], [address] [place], [place] [address]    Report Status PENDING  Incomplete  CSF culture w Gram Stain     Status: None (Preliminary result)   Collection Time: 08/28/20 12:01 PM   Specimen: PATH Cytology CSF; Cerebrospinal Fluid  Result Value Ref Range Status   Specimen Description CSF  Final   Special Requests CSF  Final   Gram Stain   Final    WBC SEEN RED BLOOD CELLS NO ORGANISMS SEEN Performed at [facility], [address], [place],  [address]    Culture PENDING  Incomplete   Report Status PENDING  Incomplete     Radiology Studies: DG FLUORO GUIDED LOC OF NEEDLE/CATH TIP FOR SPINAL INJECT RT  Result Date: 08/28/2020 CLINICAL DATA:  Altered mental status EXAM: DIAGNOSTIC LUMBAR PUNCTURE UNDER FLUOROSCOPIC GUIDANCE  FLUOROSCOPY TIME:  Fluoroscopy Time:  18 seconds Radiation Exposure Index (if provided by the fluoroscopic device): 3.8 mGy PROCEDURE: Informed consent  was obtained from the patient prior to the procedure, including potential complications of headache, allergy, and pain. With the patient prone, the lower back was prepped with Betadine. 1% Lidocaine was used for local anesthesia. Lumbar puncture was performed at the L4-L5 level using a 22 gauge needle with return of clear CSF. 10 ml of CSF were obtained for laboratory studies. The patient tolerated the procedure well and there were no apparent complications. IMPRESSION: Technically successful fluoroscopy guided lumbar puncture. Electronically Signed   By: Macy Mis M.D.   On: 08/28/2020 12:35    Scheduled Meds: . carbidopa-levodopa  1 tablet Oral Daily  . carbidopa-levodopa  2 tablet Oral BID  . Carbidopa-Levodopa ER  1 tablet Oral TID  . Carbidopa-Levodopa ER  2 tablet Oral QHS  . cyanocobalamin  1,000 mcg Intramuscular Once  . [START ON 08/29/2020] vitamin B-12  1,000 mcg Oral Daily   Or  . [START ON 08/29/2020] cyanocobalamin  1,000 mcg Intramuscular Daily  . heparin injection (subcutaneous)  5,000 Units Subcutaneous Q8H  . Ipratropium-Albuterol  1 puff Inhalation Q6H  . lamoTRIgine  25 mg Oral BID  . LORazepam  1 mg Intravenous Q6H  . propranolol ER  60 mg Oral Daily  . [START ON 09/04/2020] thiamine injection  100 mg Intravenous Daily  . vancomycin variable dose per unstable renal function (pharmacist dosing)   Does not apply See admin instructions   Continuous Infusions: . acyclovir    . ceFEPime (MAXIPIME) IV Stopped (08/28/20 5056)  . thiamine injection Stopped (08/28/20 1337)   Followed by  . [START ON 08/29/2020] thiamine injection       LOS: 2 days   Time spent: 40 minutes.  Lorella Nimrod, MD [facility]  If 7PM-7AM, please contact night-coverage [email or web address]  08/28/2020, 2:18 PM   This record has been created  using Systems analyst. Errors have been sought and corrected,but may not always be located. Such creation errors do not reflect on the standard of care.

## 2020-08-28 NOTE — Progress Notes (Signed)
   08/28/20 1446  Clinical Encounter Type  Visited With Patient  Visit Type Initial;Psychological support;Code;Spiritual support;Social support  Referral From Nurse  Consult/Referral To Chaplain   Chaplain responded to a rapid response page. Chaplain checked on nurses and PT. Nurse stated she would contact chaplain, if there is a need.

## 2020-08-28 NOTE — Progress Notes (Signed)
Noticed during assessment, pt had dried up blood on nostrils. Dr. Reesa Chew notified via secure chat.

## 2020-08-29 ENCOUNTER — Inpatient Hospital Stay: Payer: Medicare HMO

## 2020-08-29 ENCOUNTER — Encounter
Admission: EM | Disposition: A | Discharge status: Home Health | Payer: Self-pay | Source: Home / Self Care | Attending: Internal Medicine

## 2020-08-29 DIAGNOSIS — N179 Acute kidney failure, unspecified: Secondary | ICD-10-CM | POA: Diagnosis not present

## 2020-08-29 DIAGNOSIS — G9341 Metabolic encephalopathy: Secondary | ICD-10-CM | POA: Diagnosis not present

## 2020-08-29 DIAGNOSIS — R410 Disorientation, unspecified: Secondary | ICD-10-CM | POA: Diagnosis not present

## 2020-08-29 DIAGNOSIS — F319 Bipolar disorder, unspecified: Secondary | ICD-10-CM | POA: Diagnosis not present

## 2020-08-29 DIAGNOSIS — D729 Disorder of white blood cells, unspecified: Secondary | ICD-10-CM | POA: Diagnosis not present

## 2020-08-29 LAB — BLOOD GAS, ARTERIAL
Acid-base deficit: 3 mmol/L — ABNORMAL HIGH (ref 0.0–2.0)
Bicarbonate: 22 mmol/L (ref 20.0–28.0)
O2 Saturation: 91 %
Patient temperature: 37
pCO2 arterial: 38 mmHg (ref 32.0–48.0)
pH, Arterial: 7.37 (ref 7.350–7.450)
pO2, Arterial: 63 mmHg — ABNORMAL LOW (ref 83.0–108.0)

## 2020-08-29 LAB — CBC
HCT: 29.9 % — ABNORMAL LOW (ref 36.0–46.0)
Hemoglobin: 10 g/dL — ABNORMAL LOW (ref 12.0–15.0)
MCH: 30.5 pg (ref 26.0–34.0)
MCHC: 33.4 g/dL (ref 30.0–36.0)
MCV: 91.2 fL (ref 80.0–100.0)
Platelets: 285 10*3/uL (ref 150–400)
RBC: 3.28 MIL/uL — ABNORMAL LOW (ref 3.87–5.11)
RDW: 14.2 % (ref 11.5–15.5)
WBC: 7.7 10*3/uL (ref 4.0–10.5)
nRBC: 0 % (ref 0.0–0.2)

## 2020-08-29 LAB — RENAL FUNCTION PANEL
Albumin: 2.9 g/dL — ABNORMAL LOW (ref 3.5–5.0)
Anion gap: 19 — ABNORMAL HIGH (ref 5–15)
BUN: 96 mg/dL — ABNORMAL HIGH (ref 6–20)
CO2: 20 mmol/L — ABNORMAL LOW (ref 22–32)
Calcium: 8.7 mg/dL — ABNORMAL LOW (ref 8.9–10.3)
Chloride: 104 mmol/L (ref 98–111)
Creatinine, Ser: 8.43 mg/dL — ABNORMAL HIGH (ref 0.44–1.00)
GFR, Estimated: 5 mL/min — ABNORMAL LOW (ref >60)
Glucose, Bld: 94 mg/dL (ref 70–99)
Phosphorus: 7.3 mg/dL — ABNORMAL HIGH (ref 2.5–4.6)
Potassium: 4.2 mmol/L (ref 3.5–5.1)
Sodium: 143 mmol/L (ref 135–145)

## 2020-08-29 LAB — VDRL, CSF: VDRL Quant, CSF: NONREACTIVE

## 2020-08-29 LAB — CK: Total CK: 183 U/L (ref 38–234)

## 2020-08-29 LAB — LD, BODY FLUID (OTHER): LD, Body Fluid: 16 IU/L

## 2020-08-29 LAB — HEPATITIS B SURFACE ANTIBODY,QUALITATIVE: Hep B S Ab: NONREACTIVE

## 2020-08-29 LAB — GLUCOSE, CAPILLARY: Glucose-Capillary: 83 mg/dL (ref 70–99)

## 2020-08-29 LAB — HEPATITIS C ANTIBODY: HCV Ab: NONREACTIVE

## 2020-08-29 LAB — HEPATITIS B SURFACE ANTIGEN: Hepatitis B Surface Ag: NONREACTIVE

## 2020-08-29 LAB — HEPATITIS B CORE ANTIBODY, IGM: Hep B C IgM: NONREACTIVE

## 2020-08-29 SURGERY — TEMPORARY DIALYSIS CATHETER
Anesthesia: LOCAL

## 2020-08-29 MED ORDER — SODIUM CHLORIDE 0.9 % IV SOLN
2.0000 g | INTRAVENOUS | Status: DC
  Filled 2020-08-29: qty 20

## 2020-08-29 MED ORDER — CHLORHEXIDINE GLUCONATE CLOTH 2 % EX PADS
6.0000 | MEDICATED_PAD | Freq: Every day | CUTANEOUS | Status: DC
  Administered 2020-08-30 – 2020-09-03 (×4): 6 via TOPICAL

## 2020-08-29 MED ORDER — MIDAZOLAM HCL 2 MG/2ML IJ SOLN: 2.0000 mg | Freq: Once | INTRAMUSCULAR | Status: AC

## 2020-08-29 MED ORDER — MIDAZOLAM HCL 2 MG/2ML IJ SOLN
INTRAMUSCULAR | Status: AC
  Administered 2020-08-29: 2 mg via INTRAVENOUS
  Filled 2020-08-29: qty 2

## 2020-08-29 MED ORDER — SODIUM CHLORIDE 0.9 % IV SOLN
2.0000 g | Freq: Two times a day (BID) | INTRAVENOUS | Status: DC
  Administered 2020-08-29 – 2020-08-30 (×3): 2 g via INTRAVENOUS
  Filled 2020-08-29: qty 2
  Filled 2020-08-29 (×2): qty 20
  Filled 2020-08-29: qty 2
  Filled 2020-08-29: qty 20
  Filled 2020-08-29: qty 2

## 2020-08-29 NOTE — Progress Notes (Addendum)
PROGRESS NOTE    IJANAE Pearson  GUY:[id number] DOB: 1961/03/29 DOA: 08/26/2020 PCP: Valera Castle, MD   Brief Narrative: Taken from H&P. Stacy Pearson is a 60 y.o. female with medical history significant of bipolar, depression, anxiety, Parkinson, epilepsy, HTN, COPD, presents with AMS. On presentation patient was confused and agitated.  Admitting provider was able to contact her mother who is the only contact listed, and according to her she lives alone and was at her normal state of health until Friday afternoon. She was sleeping most of the time since then and found to be more confused and somnolent and EMS was called. Mother was not aware of any recent illnesses. On presentation she was found to have neutrophilic predominant leukocytosis, lactic acidosis at 6.7, Covid PCR negative, AKI, tachycardic and tachypneic.  Chest x-ray with low lung volumes and no infiltrate.  CT head was negative.  Lactic acidosis started improving, worsening renal function and hyperkalemia next morning.  Procalcitonin elevated at 1.77.  Blood and urine cultures negative.  Remained very altered, unable to provide any history. Worsening renal function, nephrology is involved. Neurology was consulted with persistent altered mental status, LP was done and initial labs concerning for intracranial/meningeal infection.  Patient is currently on broad spectrum antibiotics.  ID was also consulted. Pending final culture results, VDRL CSF and HSV PCR.  Unable to reach mother, only listed contact, unknown baseline.  Subjective: Seems very altered, tachypneic and tremulous. Patient was lying with eyes close and did not open up on command.  Withdrawing with noxious stimuli.  Did not talk at all.   Assessment & Plan:   Principal Problem:   Delirium Active Problems:   Bipolar 1 disorder (HCC)   COPD (chronic obstructive pulmonary disease) (HCC)   Essential tremor   Migraine without aura   Parkinson's disease  ([place])   Epilepsy ([place])   Depression with anxiety   HTN (hypertension)   Acute metabolic encephalopathy   UTI (urinary tract infection)   AKI (acute kidney injury) ([place])   Severe sepsis with septic shock (HCC)  Acute Metabolic encephalopathy  Initially met severe sepsis criteria with leukocytosis, tachycardic, tachypneic, lactic acidosis, cultures remain negative,  D.D. - Uremia/Azotemia vs Drug OD (?Cariprazine) vs Infectious etio like Meningitis/encephalitis Initial CT head negative.  Procalcitonin at 1.77.  Worsening renal function/azotemia likely contributing to her MS Patient is having quite broad differential at this time which include CNS infection, polypharmacy, drug overdose. -Neurology following, s/p LP with CSF not consistent with infection - per ID less likely meningitis/encephalitis -UDS positive for benzo & tricyclics - CK -563 -Salicylates and acetaminophen levels-within normal limit. - on IV rocephin Empirically for now. received acyclovir (s/p 1 dose) on 2/15 -Keep holding sedative medications.  AKI.  Worsening renal function.  Baseline creatinine below 1->8.43 (today) Nephrology planning to start HD soon once temp cathetar in place (today) CT stone studies without any significant abnormalities except a hypodensity in the bladder wall which can represent some bleed without any wall thickness. UA was positive for RBCs, urine cultures negative. There was some concern of urinary retention so Foley was placed on 2/15 due to worsening renal function for strict intake and output record.  Hyperkalemia.  Resolved today with Lokelma.  Most likely secondary to worsening renal function.  Concern of renal tubular acidosis. -Monitor potassium.  Anion gap metabolic acidosis.  Most likely secondary to lactic acidosis. Elevated CK so rhabdomyolysis can be playing a role. -should correct with HD  Bipolar 1  disorder with depression and anxiety.  Psych is recommending continuation of  benzos with prn Ativan as she was taking very high dose at baseline to prevent any withdrawal. -Continue with Ativan 2 mg IV Q 2 hrs prn -Keep holding rest of her sedating medications.  COPD.  Appears stable, no wheezing and chest x-ray without any acute abnormality. -As needed bronchodilators  History of Parkinson's disease.  Unknown baseline -Continue with home dose of Sinemet. -Continue with propranolol-do not know whether tremors are essential or secondary to underlying Parkinson's.  History of migraine. -Continue as needed Tylenol  History of seizure disorder ??  No witnessed seizure-like activity. -Continue home dose of Lamictal-not sure whether she was taking it for seizures. -Continue with Ativan as needed. -EEG shows triphasic waves c/s medication related and no epileptic waves, repeat EEG today per neuro  Hypertension.  Blood pressure within goal. On home propranolol. -Can do as needed hydralazine.   Objective: Vitals:   08/29/20 1625 08/29/20 1630 08/29/20 1645 08/29/20 1700  BP:  (!) 157/87 (!) 145/84 (!) 162/85  Pulse: 82 80 80 83  Resp: (!) 29 (!) 24 (!) 24 (!) 29  Temp:      TempSrc:      SpO2: 99% 100% 100% 99%  Weight:      Height:        Intake/Output Summary (Last 24 hours) at 08/29/2020 1741 Last data filed at 08/29/2020 1015 Gross per 24 hour  Intake 1196.8 ml  Output 301 ml  Net 895.8 ml   Filed Weights   08/27/20 0100 08/28/20 0245 08/29/20 0337  Weight: 64.1 kg 66.8 kg 67.1 kg    Examination:  General.  Chronically ill-appearing lady, in acute distress, responding to noxious stimuli, not following any command Pulmonary.  Lungs clear bilaterally, normal respiratory effort. Tachypneic CV.  Regular rate and rhythm, no JVD, rub or murmur. Abdomen.  Soft, nontender, nondistended, BS positive. CNS.  Patient only responding to noxious stimuli by withdrawing the limbs, not following any commands. Extremities.  No edema, no cyanosis, pulses intact  and symmetrical. Psychiatry.  Judgment and insight appears impaired  DVT prophylaxis: Lovenox Code Status: DNR-admitting provider confirmed with mother Family Communication: None, nephro is trying to call mother to start HD Disposition Plan: unknown Status is: Inpatient  Patient looks critically sick and high risk for respi failure and multi-organ failure including death. Will transfer to [place] for close monitoring. D/w ICU team and requested c/s. I've d/w all specialists and nursing as well.  Remains inpatient appropriate because:Inpatient level of care appropriate due to severity of illness   Dispo: The patient is from: Home              Anticipated d/c is to: To be determined              Anticipated d/c date is: 3 days              Patient currently is not medically stable to d/c.   Difficult to place patient Yes              Level of care: Stepdown  Consultants:   Psychiatry  Nephrology  Neurology  ID  Procedures:  Antimicrobials:  Vancomycin Cefepime   acyclovir  Data Reviewed: I have personally reviewed following labs and imaging studies  CBC: Recent Labs  Lab 08/26/20 0508 08/27/20 0643 08/28/20 0902 08/29/20 1018  WBC 18.3* 15.3* 7.8 7.7  NEUTROABS 16.7*  --   --   --  HGB 14.9 11.8* 9.8* 10.0*  HCT 46.0 35.2* 29.6* 29.9*  MCV 94.7 92.6 91.4 91.2  PLT 475* 281 268 845   Basic Metabolic Panel: Recent Labs  Lab 08/26/20 0508 08/27/20 0643 08/28/20 0902 08/29/20 1018  NA 146* 145 144 143  K 4.8 5.4* 3.9 4.2  CL 112* 110 105 104  CO2 12* 17* 23 20*  GLUCOSE 148* 75 120* 94  BUN 43* 63* 87* 96*  CREATININE 3.53* 5.55* 6.92* 8.43*  CALCIUM 9.5 9.2 8.5* 8.7*  PHOS  --   --  5.8* 7.3*   GFR: Estimated Creatinine Clearance: 7 mL/min (A) (by C-G formula based on SCr of 8.43 mg/dL (H)). Liver Function Tests: Recent Labs  Lab 08/26/20 0508 08/28/20 0902 08/29/20 1018  AST 46*  --   --   ALT 20  --   --   ALKPHOS 112  --   --   BILITOT 0.5   --   --   PROT 9.0*  --   --   ALBUMIN 4.5 2.9* 2.9*   No results for input(s): LIPASE, AMYLASE in the last 168 hours. Recent Labs  Lab 08/27/20 1557  AMMONIA 28   Coagulation Profile: Recent Labs  Lab 08/28/20 0902  INR 1.1   Cardiac Enzymes: Recent Labs  Lab 08/27/20 0911 08/29/20 1018  CKTOTAL 364* 183   BNP (last 3 results) No results for input(s): PROBNP in the last 8760 hours. HbA1C: No results for input(s): HGBA1C in the last 72 hours. CBG: Recent Labs  Lab 08/28/20 1449 08/29/20 1152  GLUCAP 96 83   Lipid Profile: No results for input(s): CHOL, HDL, LDLCALC, TRIG, CHOLHDL, LDLDIRECT in the last 72 hours. Thyroid Function Tests: Recent Labs    08/27/20 1557  TSH 1.155   Anemia Panel: Recent Labs    08/27/20 1557  VITAMINB12 221  FOLATE 31.0   Sepsis Labs: Recent Labs  Lab 08/26/20 0508 08/26/20 0640 08/26/20 1030 08/26/20 1451 08/27/20 1235 08/27/20 1510  PROCALCITON 1.77  --   --   --   --   --   LATICACIDVEN  --    < > 2.3* 2.8* 3.0* 2.1*   < > = values in this interval not displayed.    Recent Results (from the past 240 hour(s))  Resp Panel by RT-PCR (Flu A&B, Covid) Urine, Clean Catch     Status: None   Collection Time: 08/26/20  5:08 AM   Specimen: Urine, Clean Catch; Nasopharyngeal(NP) swabs in vial transport medium  Result Value Ref Range Status   SARS Coronavirus 2 by RT PCR NEGATIVE NEGATIVE Final    Comment: (NOTE) SARS-CoV-2 target nucleic acids are NOT DETECTED.  The SARS-CoV-2 RNA is generally detectable in upper respiratory specimens during the acute phase of infection. The lowest concentration of SARS-CoV-2 viral copies this assay can detect is 138 copies/mL. A negative result does not preclude SARS-Cov-2 infection and should not be used as the sole basis for treatment or other patient management decisions. A negative result may occur with  improper specimen collection/handling, submission of specimen other than  nasopharyngeal swab, presence of viral mutation(s) within the areas targeted by this assay, and inadequate number of viral copies(<138 copies/mL). A negative result must be combined with clinical observations, patient history, and epidemiological information. The expected result is Negative.  Fact Sheet for Patients:  EntrepreneurPulse.com.au  Fact Sheet for Healthcare Providers:  IncredibleEmployment.be  This test is no t yet approved or cleared by the [place] and  has been authorized for detection and/or diagnosis of SARS-CoV-2 by FDA under an Emergency Use Authorization (EUA). This EUA will remain  in effect (meaning this test can be used) for the duration of the COVID-19 declaration under Section 564(b)(1) of the Act, 21 U.S.C.section 360bbb-3(b)(1), unless the authorization is terminated  or revoked sooner.       Influenza A by PCR NEGATIVE NEGATIVE Final   Influenza B by PCR NEGATIVE NEGATIVE Final    Comment: (NOTE) The Xpert Xpress SARS-CoV-2/FLU/RSV plus assay is intended as an aid in the diagnosis of influenza from Nasopharyngeal swab specimens and should not be used as a sole basis for treatment. Nasal washings and aspirates are unacceptable for Xpert Xpress SARS-CoV-2/FLU/RSV testing.  Fact Sheet for Patients: EntrepreneurPulse.com.au  Fact Sheet for Healthcare Providers: IncredibleEmployment.be  This test is not yet approved or cleared by the [place] FDA and has been authorized for detection and/or diagnosis of SARS-CoV-2 by FDA under an Emergency Use Authorization (EUA). This EUA will remain in effect (meaning this test can be used) for the duration of the COVID-19 declaration under Section 564(b)(1) of the Act, 21 U.S.C. section 360bbb-3(b)(1), unless the authorization is terminated or revoked.  Performed at [facility], [address], [place], [place]  [address]   Urine culture     Status: None   Collection Time: 08/26/20  5:54 AM   Specimen: Urine, Clean Catch  Result Value Ref Range Status   Specimen Description   Final    URINE, CLEAN CATCH Performed at [facility], [address], [place], [place] [address]    Special Requests   Final    NONE Performed at [facility], [address], [place], [place] [address]    Culture   Final    NO GROWTH Performed at [facility], [place] [address] [place], [place] [address]    Report Status 08/27/2020 FINAL  Final  Culture, blood (routine x 2)     Status: None (Preliminary result)   Collection Time: 08/26/20  6:03 AM   Specimen: Left Antecubital; Blood  Result Value Ref Range Status   Specimen Description LEFT ANTECUBITAL  Final   Special Requests   Final    BOTTLES DRAWN AEROBIC AND ANAEROBIC Blood Culture adequate volume   Culture   Final    NO GROWTH 3 DAYS Performed at [facility], [address] [place], [place] [address]    Report Status PENDING  Incomplete  Culture, blood (routine x 2)     Status: None (Preliminary result)   Collection Time: 08/26/20  8:55 AM   Specimen: BLOOD LEFT FOREARM  Result Value Ref Range Status   Specimen Description BLOOD LEFT FOREARM  Final   Special Requests   Final    BOTTLES DRAWN AEROBIC AND ANAEROBIC Blood Culture adequate volume   Culture   Final    NO GROWTH 3 DAYS Performed at [facility], [address] [place], [place] [address]    Report Status PENDING  Incomplete  CSF culture w Gram Stain     Status: None (Preliminary result)   Collection Time: 08/28/20 12:01 PM   Specimen: PATH Cytology CSF; Cerebrospinal Fluid  Result Value Ref Range Status   Specimen Description   Final    CSF Performed at [facility], [address], [place], [place] [address]    Special Requests   Final    NONE Performed at [facility], [place]  [address] [place], [place] [address]    Gram  Stain   Final    WBC SEEN RED BLOOD CELLS NO ORGANISMS SEEN Performed at [facility], [address] [place], [place] [address]    Culture   Final    NO GROWTH < 24 HOURS Performed at [facility], [place] [address], [place], [place] [address]    Report Status PENDING  Incomplete  Culture, fungus without smear     Status: None (Preliminary result)   Collection Time: 08/28/20 12:01 PM   Specimen: PATH Cytology CSF; Cerebrospinal Fluid  Result Value Ref Range Status   Specimen Description   Final    CSF Performed at [facility], [address], [place],  [address]    Special Requests NONE  Final   Culture   Final    NO FUNGUS ISOLATED AFTER 1 DAY Performed at [facility], [place] [address] [place],  [address]    Report Status PENDING  Incomplete     Radiology Studies: EEG  Result Date: 08/28/2020 Lora Havens, MD     08/28/2020  3:40 PM Patient Name: Stacy Pearson MRN: [id number] Epilepsy Attending: Lora Havens Referring Physician/Provider: Dr Malvin Johns Date: 08/28/2020 Duration: 26.12 mins Patient history: 60yo F with ams. EEG to evaluate for seizure Level of alertness: Awake AEDs during EEG study: LTG, LRZ Technical aspects: This EEG study was done with scalp electrodes positioned according to the 10-20 International system of electrode placement. Electrical activity was acquired at a sampling rate of '500Hz'  and reviewed with a high frequency filter of '70Hz'  and a low frequency filter of '1Hz' . EEG data were recorded continuously and digitally stored. Description: No posterior dominant rhythm was seen. EEG showed continuous generalized 3 to 6 Hz theta-delta slowing. Generalized periodic discharges with triphasic morphology at '1hz'  were also noted. Physiologic photic driving was not seen during photic stimulation.  Hyperventilation  Was not performed.   ABNORMALITY -Continuous slow, generalized -Periodic discharges with triphasic  morphology, generalized IMPRESSION: This study is suggestive of moderate diffuse encephalopathy, nonspecific etiology. Given the morphology and frequency of periodic discharges with triphasic morphology are most likely due to toxic-metabolic causes. No seizures or definite epileptiform discharges were seen throughout the recording. Lora Havens   MR BRAIN WO CONTRAST  Result Date: 08/28/2020 CLINICAL DATA:  Sepsis.  Possible bacterial meningitis. EXAM: MRI HEAD WITHOUT CONTRAST TECHNIQUE: Multiplanar, multiecho pulse sequences of the brain and surrounding structures were obtained without intravenous contrast. COMPARISON:  None. FINDINGS: Severely motion degraded examination. Brain: No acute infarct, mass effect or extra-axial collection. No acute or chronic hemorrhage. Normal white matter signal, parenchymal volume and CSF spaces. The midline structures are normal. Vascular: Major flow voids are preserved. Skull and upper cervical spine: Normal calvarium and skull base. Visualized upper cervical spine and soft tissues are normal. Sinuses/Orbits:No paranasal sinus fluid levels or advanced mucosal thickening. No mastoid or middle ear effusion. Normal orbits. IMPRESSION: 1. Severely motion degraded examination. 2. Within that limitation, no acute intracranial abnormality. Electronically Signed   By: Ulyses Jarred M.D.   On: 08/28/2020 23:01   DG Chest Port 1 View  Result Date: 08/29/2020 CLINICAL DATA:  Altered mental status with shortness of breath EXAM: PORTABLE CHEST 1 VIEW COMPARISON:  08/26/2020 FINDINGS: Low lung volumes. No consolidation or edema. No pleural effusion or pneumothorax. Cardiomediastinal contours are within normal limits for technique. IMPRESSION: No acute process in the chest. Electronically Signed   By: Malachi Carl  Patel M.D.   On: 08/29/2020 14:15   DG FLUORO GUIDED LOC OF NEEDLE/CATH TIP FOR SPINAL INJECT RT  Result Date: 08/28/2020 CLINICAL DATA:  Altered mental status EXAM:  DIAGNOSTIC LUMBAR PUNCTURE UNDER FLUOROSCOPIC GUIDANCE FLUOROSCOPY TIME:  Fluoroscopy Time:  18 seconds Radiation Exposure Index (if provided by the fluoroscopic device): 3.8 mGy PROCEDURE: Informed consent was obtained from the patient prior to the procedure, including potential complications of headache, allergy, and pain. With the patient prone, the lower back was prepped with Betadine. 1% Lidocaine was used for local anesthesia. Lumbar puncture was performed at the L4-L5 level using a 22 gauge needle with return of clear CSF. 10 ml of CSF were obtained for laboratory studies. The patient tolerated the procedure well and there were no apparent complications. IMPRESSION: Technically successful fluoroscopy guided lumbar puncture. Electronically Signed   By: Macy Mis M.D.   On: 08/28/2020 12:35    Scheduled Meds: . carbidopa-levodopa  1 tablet Oral Daily  . carbidopa-levodopa  2 tablet Oral BID  . Carbidopa-Levodopa ER  1 tablet Oral TID  . Carbidopa-Levodopa ER  2 tablet Oral QHS  . Chlorhexidine Gluconate Cloth  6 each Topical Q0600  . [START ON 08/30/2020] Chlorhexidine Gluconate Cloth  6 each Topical Q0600  . vitamin B-12  1,000 mcg Oral Daily   Or  . cyanocobalamin  1,000 mcg Intramuscular Daily  . heparin injection (subcutaneous)  5,000 Units Subcutaneous Q8H  . Ipratropium-Albuterol  1 puff Inhalation Q6H  . lamoTRIgine  25 mg Oral BID  . propranolol ER  60 mg Oral Daily  . [START ON 09/04/2020] thiamine injection  100 mg Intravenous Daily   Continuous Infusions: . cefTRIAXone (ROCEPHIN)  IV    . lactated ringers 75 mL/hr at 08/29/20 0640  . thiamine injection       LOS: 3 days   Time spent (critical care): 40 minutes.  Max Sane, MD [facility]  If 7PM-7AM, please contact night-coverage [email or web address]  08/29/2020, 5:41 PM   This record has been created using Systems analyst. Errors have been sought and corrected,but may not always be located.  Such creation errors do not reflect on the standard of care.

## 2020-08-29 NOTE — Progress Notes (Signed)
[place], [place] 08/29/20  Subjective:  Stacy Pearson is a 60 y.o. caucasian female with PMHX of bipolar, depression, anxiety, parkinson's, epilepsy, HTN, and COPD. She presents to the ED with AMS.   Dx of severe sepsis with septic shock due to possible UTI.   However now there is report that patient may have overdosed on cariprazine.   Patient with worsening renal function. Creatinine 8.43 - despite IV fluids  Now being moved to step down.   Objective:  Vital signs in last 24 hours:  Temp:  [97.7 F (36.5 C)-98.8 F (37.1 C)] 98.7 F (37.1 C) (02/16 0738) Pulse Rate:  [83-95] 95 (02/16 1128) Resp:  [17-40] 40 (02/16 1128) BP: (145-158)/(81-95) 156/85 (02/16 1128) SpO2:  [68 %-95 %] 91 % (02/16 1128) Weight:  [67.1 kg] 67.1 kg (02/16 0337)  Weight change: 0.331 kg Filed Weights   08/27/20 0100 08/28/20 0245 08/29/20 0337  Weight: 64.1 kg 66.8 kg 67.1 kg    Intake/Output:    Intake/Output Summary (Last 24 hours) at 08/29/2020 1222 Last data filed at 08/29/2020 1015 Gross per 24 hour  Intake 1296.8 ml  Output 301 ml  Net 995.8 ml    Physical Exam: General: Ill appearing, laying in bed  Head: Dry oral mucosal membranes  Eyes: Anicteric  Neck: Supple, trachea midline  Lungs:  Clear to auscultation  Heart: Regular rate and rhythm  Abdomen:  Soft, nontender,   Extremities:  no peripheral edema.  Neurologic: Not following commands, incoherent speech  Skin: No lesions  Access: none   Basic Metabolic Panel:  Recent Labs  Lab 08/26/20 0508 08/27/20 0643 08/28/20 0902 08/29/20 1018  NA 146* 145 144 143  K 4.8 5.4* 3.9 4.2  CL 112* 110 105 104  CO2 12* 17* 23 20*  GLUCOSE 148* 75 120* 94  BUN 43* 63* 87* 96*  CREATININE 3.53* 5.55* 6.92* 8.43*  CALCIUM 9.5 9.2 8.5* 8.7*  PHOS  --   --  5.8* 7.3*     CBC: Recent Labs  Lab 08/26/20 0508 08/27/20 0643 08/28/20 0902 08/29/20 1018  WBC 18.3* 15.3* 7.8 7.7  NEUTROABS  16.7*  --   --   --   HGB 14.9 11.8* 9.8* 10.0*  HCT 46.0 35.2* 29.6* 29.9*  MCV 94.7 92.6 91.4 91.2  PLT 475* 281 268 285     No results found for: HEPBSAG, HEPBSAB, HEPBIGM    Microbiology:  Recent Results (from the past 240 hour(s))  Resp Panel by RT-PCR (Flu A&B, Covid) Urine, Clean Catch     Status: None   Collection Time: 08/26/20  5:08 AM   Specimen: Urine, Clean Catch; Nasopharyngeal(NP) swabs in vial transport medium  Result Value Ref Range Status   SARS Coronavirus 2 by RT PCR NEGATIVE NEGATIVE Final    Comment: (NOTE) SARS-CoV-2 target nucleic acids are NOT DETECTED.  The SARS-CoV-2 RNA is generally detectable in upper respiratory specimens during the acute phase of infection. The lowest concentration of SARS-CoV-2 viral copies this assay can detect is 138 copies/mL. A negative result does not preclude SARS-Cov-2 infection and should not be used as the sole basis for treatment or other patient management decisions. A negative result may occur with  improper specimen collection/handling, submission of specimen other than nasopharyngeal swab, presence of viral mutation(s) within the areas targeted by this assay, and inadequate number of viral copies(<138 copies/mL). A negative result must be combined with clinical observations, patient history, and epidemiological information. The expected result is  Negative.  Fact Sheet for Patients:  EntrepreneurPulse.com.au  Fact Sheet for Healthcare Providers:  IncredibleEmployment.be  This test is no t yet approved or cleared by the [place] FDA and  has been authorized for detection and/or diagnosis of SARS-CoV-2 by FDA under an Emergency Use Authorization (EUA). This EUA will remain  in effect (meaning this test can be used) for the duration of the COVID-19 declaration under Section 564(b)(1) of the Act, 21 U.S.C.section 360bbb-3(b)(1), unless the authorization is terminated  or  revoked sooner.       Influenza A by PCR NEGATIVE NEGATIVE Final   Influenza B by PCR NEGATIVE NEGATIVE Final    Comment: (NOTE) The Xpert Xpress SARS-CoV-2/FLU/RSV plus assay is intended as an aid in the diagnosis of influenza from Nasopharyngeal swab specimens and should not be used as a sole basis for treatment. Nasal washings and aspirates are unacceptable for Xpert Xpress SARS-CoV-2/FLU/RSV testing.  Fact Sheet for Patients: EntrepreneurPulse.com.au  Fact Sheet for Healthcare Providers: IncredibleEmployment.be  This test is not yet approved or cleared by the [place] FDA and has been authorized for detection and/or diagnosis of SARS-CoV-2 by FDA under an Emergency Use Authorization (EUA). This EUA will remain in effect (meaning this test can be used) for the duration of the COVID-19 declaration under Section 564(b)(1) of the Act, 21 U.S.C. section 360bbb-3(b)(1), unless the authorization is terminated or revoked.  Performed at [facility], [address] [place], [place] [address]   Urine culture     Status: None   Collection Time: 08/26/20  5:54 AM   Specimen: Urine, Clean Catch  Result Value Ref Range Status   Specimen Description   Final    URINE, CLEAN CATCH Performed at [facility], [address], [place], [place] [address]    Special Requests   Final    NONE Performed at [facility], [address] [place], [place] [address]    Culture   Final    NO GROWTH Performed at [facility], [place] [address], [place], [place] [address]    Report Status 08/27/2020 FINAL  Final  Culture, blood (routine x 2)     Status: None (Preliminary result)   Collection Time: 08/26/20  6:03 AM   Specimen: Left Antecubital; Blood  Result Value Ref Range Status   Specimen Description LEFT ANTECUBITAL  Final   Special Requests   Final    BOTTLES DRAWN AEROBIC AND ANAEROBIC Blood Culture adequate  volume   Culture   Final    NO GROWTH 3 DAYS Performed at [facility], [address] [place], [place] [address]    Report Status PENDING  Incomplete  Culture, blood (routine x 2)     Status: None (Preliminary result)   Collection Time: 08/26/20  8:55 AM   Specimen: BLOOD LEFT FOREARM  Result Value Ref Range Status   Specimen Description BLOOD LEFT FOREARM  Final   Special Requests   Final    BOTTLES DRAWN AEROBIC AND ANAEROBIC Blood Culture adequate volume   Culture   Final    NO GROWTH 3 DAYS Performed at [facility], [address], [place], [place] [address]    Report Status PENDING  Incomplete  CSF culture w Gram Stain     Status: None (Preliminary result)   Collection Time: 08/28/20 12:01 PM   Specimen: PATH Cytology CSF; Cerebrospinal Fluid  Result Value Ref Range Status   Specimen Description   Final  CSF Performed at [facility], [address] [place], [place] [address]    Special Requests   Final    NONE Performed at [facility], [place] [address], [place], [place] [address]    Gram Stain   Final    WBC SEEN RED BLOOD CELLS NO ORGANISMS SEEN Performed at [facility], [address], [place], [place] [address]    Culture   Final    NO GROWTH < 24 HOURS Performed at [facility], [place] [address] [place], [place] [address]    Report Status PENDING  Incomplete  Culture, fungus without smear     Status: None (Preliminary result)   Collection Time: 08/28/20 12:01 PM   Specimen: PATH Cytology CSF; Cerebrospinal Fluid  Result Value Ref Range Status   Specimen Description   Final    CSF Performed at [facility], [address], [place], [place] [address]    Special Requests NONE  Final   Culture   Final    NO FUNGUS ISOLATED AFTER 1 DAY Performed at [facility], [place] [address], [place], [place] [address]    Report Status PENDING  Incomplete    Coagulation Studies: Recent Labs     08/28/20 0902  LABPROT 13.3  INR 1.1    Urinalysis: No results for input(s): COLORURINE, LABSPEC, PHURINE, GLUCOSEU, HGBUR, BILIRUBINUR, KETONESUR, PROTEINUR, UROBILINOGEN, NITRITE, LEUKOCYTESUR in the last 72 hours.  Invalid input(s): APPERANCEUR    Imaging: EEG  Result Date: 08/28/2020 Lora Havens, MD     08/28/2020  3:40 PM Patient Name: KHADIJATOU BORAK MRN: [id number] Epilepsy Attending: Lora Havens Referring Physician/Provider: Dr Malvin Johns Date: 08/28/2020 Duration: 26.12 mins Patient history: 60yo F with ams. EEG to evaluate for seizure Level of alertness: Awake AEDs during EEG study: LTG, LRZ Technical aspects: This EEG study was done with scalp electrodes positioned according to the 10-20 International system of electrode placement. Electrical activity was acquired at a sampling rate of 500Hz  and reviewed with a high frequency filter of 70Hz  and a low frequency filter of 1Hz . EEG data were recorded continuously and digitally stored. Description: No posterior dominant rhythm was seen. EEG showed continuous generalized 3 to 6 Hz theta-delta slowing. Generalized periodic discharges with triphasic morphology at 1hz  were also noted. Physiologic photic driving was not seen during photic stimulation.  Hyperventilation  Was not performed.   ABNORMALITY -Continuous slow, generalized -Periodic discharges with triphasic morphology, generalized IMPRESSION: This study is suggestive of moderate diffuse encephalopathy, nonspecific etiology. Given the morphology and frequency of periodic discharges with triphasic morphology are most likely due to toxic-metabolic causes. No seizures or definite epileptiform discharges were seen throughout the recording. Lora Havens   MR BRAIN WO CONTRAST  Result Date: 08/28/2020 CLINICAL DATA:  Sepsis.  Possible bacterial meningitis. EXAM: MRI HEAD WITHOUT CONTRAST TECHNIQUE: Multiplanar, multiecho pulse sequences of the brain and surrounding  structures were obtained without intravenous contrast. COMPARISON:  None. FINDINGS: Severely motion degraded examination. Brain: No acute infarct, mass effect or extra-axial collection. No acute or chronic hemorrhage. Normal white matter signal, parenchymal volume and CSF spaces. The midline structures are normal. Vascular: Major flow voids are preserved. Skull and upper cervical spine: Normal calvarium and skull base. Visualized upper cervical spine and soft tissues are normal. Sinuses/Orbits:No paranasal sinus fluid levels or advanced mucosal thickening. No mastoid or middle ear effusion. Normal orbits. IMPRESSION: 1. Severely motion degraded examination. 2. Within that limitation, no acute intracranial  abnormality. Electronically Signed   By: Ulyses Jarred M.D.   On: 08/28/2020 23:01   DG FLUORO GUIDED LOC OF NEEDLE/CATH TIP FOR SPINAL INJECT RT  Result Date: 08/28/2020 CLINICAL DATA:  Altered mental status EXAM: DIAGNOSTIC LUMBAR PUNCTURE UNDER FLUOROSCOPIC GUIDANCE FLUOROSCOPY TIME:  Fluoroscopy Time:  18 seconds Radiation Exposure Index (if provided by the fluoroscopic device): 3.8 mGy PROCEDURE: Informed consent was obtained from the patient prior to the procedure, including potential complications of headache, allergy, and pain. With the patient prone, the lower back was prepped with Betadine. 1% Lidocaine was used for local anesthesia. Lumbar puncture was performed at the L4-L5 level using a 22 gauge needle with return of clear CSF. 10 ml of CSF were obtained for laboratory studies. The patient tolerated the procedure well and there were no apparent complications. IMPRESSION: Technically successful fluoroscopy guided lumbar puncture. Electronically Signed   By: Macy Mis M.D.   On: 08/28/2020 12:35     Medications:   . cefTRIAXone (ROCEPHIN)  IV Stopped (08/29/20 5397)  . lactated ringers 75 mL/hr at 08/29/20 0640  . thiamine injection     . carbidopa-levodopa  1 tablet Oral Daily  .  carbidopa-levodopa  2 tablet Oral BID  . Carbidopa-Levodopa ER  1 tablet Oral TID  . Carbidopa-Levodopa ER  2 tablet Oral QHS  . Chlorhexidine Gluconate Cloth  6 each Topical Q0600  . vitamin B-12  1,000 mcg Oral Daily   Or  . cyanocobalamin  1,000 mcg Intramuscular Daily  . heparin injection (subcutaneous)  5,000 Units Subcutaneous Q8H  . Ipratropium-Albuterol  1 puff Inhalation Q6H  . lamoTRIgine  25 mg Oral BID  . propranolol ER  60 mg Oral Daily  . [START ON 09/04/2020] thiamine injection  100 mg Intravenous Daily   acetaminophen **OR** acetaminophen, albuterol, LORazepam, ondansetron (ZOFRAN) IV  Assessment/ Plan:  59 y.o. female with PMHX of bipolar, depression, anxiety, parkinson's, epilepsy, HTN, and COPD was admitted on 08/26/2020   Principal Problem:   Delirium Active Problems:   Bipolar 1 disorder (HCC)   COPD (chronic obstructive pulmonary disease) (HCC)   Essential tremor   Migraine without aura   Parkinson's disease ([place])   Epilepsy ([place])   Depression with anxiety   HTN (hypertension)   Acute metabolic encephalopathy   UTI (urinary tract infection)   AKI (acute kidney injury) ([place])   Severe sepsis with septic shock (HCC)  Confusion [R41.0] Severe sepsis with septic shock ([place]) [A41.9, R65.21] Severe sepsis ([place]) [A41.9, R65.20] Altered mental status, unspecified altered mental status type [R41.82] Sepsis, due to unspecified organism, unspecified whether acute organ dysfunction present ([place]) [A41.9]  #. Acute kidney injury : baseline creatinine 0.8 with normal GFR on 01/31/2020 - discontinue IV fluids - patient will need renal replacement therapy - Consult vascular - We will call Mother for goals of care.   #Hyperkalemia: improved with bicarb infusion.   # Anion gap metabolic acidosis: Salicylate levels negative, acetaminophen levels negative. Normal glucose. Lactic acidosis has improved.  Most likely due to uremia - renal replacement as above.   #  Hypernatremia: secondary to free water deficit.  - improved with IV fluids   LOS: 3 Lavonia Dana 2/16/202212:22 PM  [facility] [place], [place]

## 2020-08-29 NOTE — Progress Notes (Signed)
NEUROLOGY CONSULTATION PROGRESS NOTE   Date of service: August 29, 2020 Patient Name: Stacy Pearson MRN:  [id number] DOB:  18-Mar-1961  Brief HPI  Stacy Pearson is a 60 y.o. female admitted with somnolence, confusion, agitation and lethargy. Found to have sepsis with unclear/unknown source, elevated serum lactate along with significant AKI. Started on Meningitis coverage and LP after about 2 doses of Vanc, Cefepime and Acyclovir with pleocytosis WBC count of 49 and mildly elevated CSF protein. CTH negative. MRI Brain negative for an significant abnormalities. rEEG with no seizures, notable for triphasic waves along the ictal-interictal spectrum with reactivity which is somewhat less concerning for seizures. She underwent dialysis x 3 with significant improvement in her mentation.  Interval Hx   On Bipap. She is more awake today and answers simple yes and no questions. She does wiggle her toes on command and attempts to give a thumbs up.  Vitals   Vitals:   08/29/20 0738 08/29/20 1128 08/29/20 1215 08/29/20 1230  BP: (!) 158/88 (!) 156/85 (!) 165/101 (!) 170/83  Pulse: 94 95 96 93  Resp: 20 (!) 40 (!) 48 (!) 31  Temp: 98.7 F (37.1 C)     TempSrc: Axillary     SpO2: 93% 91% 90% 97%  Weight:      Height:         Body mass index is 23.17 kg/m.  Physical Exam   General: Laying diagonally in bed; in no acute distress. HENT: Normal oropharynx and mucosa. Normal external appearance of ears and nose. Neck: Supple, no pain or tenderness CV: No JVD. No peripheral edema. Pulmonary: Symmetric Chest rise. Normal respiratory effort. Abdomen: Soft to touch, non-tender. Ext: No cyanosis, edema, or deformity Skin: No rash. Normal palpation of skin.  Musculoskeletal: Normal digits and nails by inspection. No clubbing.  Neurologic Examination  Mental status/Cognition: Eyes open. Partially opens eyes to vigorous tactile stimulation. Much less interactive then yesterday. With a lot of  encouragement, she eventually opens her eyes briefly regard my face but does not track. She responds with yes. Speech/language: Only spoke a few words including yes and okay during my brief interaction. Does not follow commands. Cranial nerves:   CN II Pupils equal and reactive to light, no VF deficits   CN III,IV,VI EOM intact, no gaze preference or deviation, no nystagmus   CN V    CN VII Symmetric smile.   CN VIII Hearing grossly intact.   CN IX & X    CN XI    CN XII     Motor:  Muscle bulk: normal, tone normal Moves all extrmities spontaneously and on command.  Reflexes:  Right Left Comments  Pectoralis      Biceps (C5/6) 2 2   Brachioradialis (C5/6) 2 2    Triceps (C6/7) 2 2    Patellar (L3/4) 2 2    Achilles (S1)      Hoffman      Plantar     Jaw jerk    Sensation:  Light touch Regards touch in all extremities   Pin prick    Temperature    Vibration   Proprioception    Coordination/Complex Motor:  Difficult to assess but no ovious ataxia or tremor.  Labs   Basic Metabolic Panel:  Lab Results  Component Value Date   NA 143 08/29/2020   K 4.2 08/29/2020   CO2 20 (L) 08/29/2020   GLUCOSE 94 08/29/2020   BUN 96 (H) 08/29/2020   CREATININE 8.43 (  H) 08/29/2020   CALCIUM 8.7 (L) 08/29/2020   GFRNONAA 5 (L) 08/29/2020   GFRAA >60 03/06/2013   HbA1c: No results found for: HGBA1C LDL: No results found for: [facility] Urine Drug Screen:     Component Value Date/Time   LABOPIA NONE DETECTED 08/27/2020 1400   COCAINSCRNUR NONE DETECTED 08/27/2020 1400   LABBENZ POSITIVE (A) 08/27/2020 1400   AMPHETMU NONE DETECTED 08/27/2020 1400   THCU NONE DETECTED 08/27/2020 1400   LABBARB NONE DETECTED 08/27/2020 1400    Alcohol Level No results found for: ETH No results found for: PHENYTOIN, ZONISAMIDE, LAMOTRIGINE, LEVETIRACETA No results found for: PHENYTOIN, PHENOBARB, VALPROATE, CBMZ  Imaging and Diagnostic studies   CTH without contrast: CTH was negative for a  large hypodensity concerning for a large territory infarct or hyperdensity concerning for an ICH.  LP with WBC count of 39, RBC count of 10. Protein of 64, glucose of 75. CSF Gram stain with no organisms. CSF cultures pending.  REEG: This study is suggestive of moderate diffuse encephalopathy, nonspecific etiology. Given the morphology and frequency of periodic discharges with triphasic morphology are most likely due to toxic-metabolic causes. No seizures or definite epileptiform discharges were seen throughout the recording.  Repeat rEEG: This study is suggestive of moderate diffuse encephalopathy, nonspecific etiology. No seizures orepileptiform discharges were seen throughout the recording.  Impression   Stacy Pearson is a 61 y.o. female  admitted with somnolence, confusion, agitation and lethargy, elevated serum lactate along with significant AKI with concerns for medication overdose vs infection and sepsis. Now improving exam with dialysis. MRI Brain without contrast is normal. LP with minimal pleocytosis on Ceftriaxone menigitis dose. CSF cultures pending. EEG x 2 with no seizure activity.  Recommendations  - Ceftriaxone per ID until CSF Cx negative. - Continue Thiamine high dose replacement until levels returns. - Continue Vit B12 replacement PO or IM. - Management od AKI per Neph. ______________________________________________________________________   Thank you for the opportunity to take part in the care of this patient. If you have any further questions, please contact the neurology consultation attending.  Signed,  [place] Pager Number [phone]

## 2020-08-29 NOTE — Significant Event (Signed)
Rapid Response Event Note   Reason for Call :  AMS/tachypneia  Initial Focused Assessment:  Bedside RN stated that patient is more altered than last night- patient not responding to voice- only pain- patient is tachypneic in the 30's.  Other vitals stable.  Patient oxygen sats 94% on room air.     Interventions:  ABG and chest x-ray ordered.    Plan of Care:  Spoke to Dr. Manuella Ghazi- he stated that patient needs emergent dialysis and a vascular catheter placed for her AKI.  Patient to transfer to SD unit- for bipap and dialysis treatment.   Event Summary:   MD Notified: Dr. Manuella Ghazi Call Time:11:29 Arrival Time:11:30 End Time:11:45  Penne Lash, RN

## 2020-08-29 NOTE — Consult Note (Addendum)
NAME:  Stacy Pearson, MRN:  [id number], DOB:  09/06/1960, LOS: 3 ADMISSION DATE:  08/26/2020, CONSULTATION DATE:  08/29/2020 REFERRING MD:  Dr. Manuella Ghazi, CHIEF COMPLAINT:  Acute Respiratory Distress, AMS   Brief History:  60 y.o. Female, DNR/DNI, admitted with Altered Mental Status and Sepsis in setting of suspected Menigitis, along with severe AKI.  Was started on Meninginitis coverage and underwent LP.  On 08/29/20 she developed worsening somnolence, worsening AKI, and Acute Respiratory Distress requiring transfer to Stepdown for BiPAP and emergent Hemodialysis.  History of Present Illness:  Stacy Pearson is a 60 y.o. female with a past medical history significant for Parkinson's Disease, Bipolar Disorder, COPD, Asthma, GERD, hypertension, basal cell carcinoma, and depression who presented to [facility] on 08/26/2020  with somnolence, confusion, agitation and lethargy.  Found to have sepsis with unclear/unknown source, elevated serum lactate along with significant AKI. Started on Meningitis coverage and LP after about 2 doses of Vanc, Cefepime and Acyclovir with pleocytosis WBC count of 49 and mildly elevated CSF protein. CTH negative and MRI Brain negative.  EEG with no seizures.  Neurology, Infectious Diseases, Psychiatry, and Nephrology have been following.  On 08/29/20 she developed worsening AKI, acute respiratory distress and worsening somnolence.  Labs notable for worsening Creatinine to 8.43.  ABG with hypoxia (pH 7.37 / pO2 63 / pCO2 38 / Bicarb 22). Chest x-ray without acute cardiopulmonary process. She was transferred to [facility] unit and placed on BiPAP.  Temporary Hemodialysis catheter has been placed for emergent Hemodialysis.  PCCM is consulted for further assistance with acute respiratory distress.  Past Medical History:  Parkinson's Disease Essential tremor Bipolar Disorder COPD Asthma GERD Anxiety Depression Hypertension Neuropathy Basal Cell Carcinoma Eczema   Significant  Hospital Events:  2/13: Admitted to Med-Surg unit 2/16: Transfer to Stepdown due to worsening somnolence and AKI, Acute Respiratory Distress; to receive emergent HD; PCCM consulted  Consults:  Hospitalist (primary service) Neurology Infectious Diseases Psychiatry Nephrology PCCM  Procedures:  2/15: Lumbar Puncture 2/16: Right Femoral Temporary HD catheter placed  Significant Diagnostic Tests:  2/13: CXR>>Low lung volumes with no acute cardiopulmonary disease. 2/13: CT Renal Stone Study>>1. Small amount of layering hyperdensity in the posterior bladder could reflect hemorrhage or tiny calculi. No obstructive uropathy. 2. Gallbladder sludge. 3. Aortic Atherosclerosis 2/13:CT Head w/o Contrast>>No acute intracranial abnormality. 2/15: MR Brain>>1. Severely motion degraded examination. 2. Within that limitation, no acute intracranial abnormality. 2/15: EEG>>This study is suggestive of moderate diffuse encephalopathy, nonspecific etiology. Given the morphology and frequency of periodic discharges with triphasic morphology are most likely due to toxic-metabolic causes. No seizures or definite epileptiform discharges were seen throughout the recording. 2/16: CXR>>No acute process in the chest.  Micro Data:  2/13: SARS-CoV-2 PCR>> negative 2/13: Influenza AMB PCR>> negative 2/13: HIV screen>> negative 2/13: Urine>> no growth 2/13: Blood culture x2>> no growth to date 2/14: RPR>> nonreactive 2/15: CSF culture>>  Antimicrobials:  Acyclovir 2/14>>2/16 Cefepime 2/13>>2/15 Ceftriaxone 2/14>> Vancomycin 2/13>>2/16 Flagyl x1 dose on 2/13  Interim History / Subjective:  -Pt transferred to [facility] unit for worsening Somnolence and Acute Respiratory Distress -Pt tachypneic (RR 30's) with assessory muscle use, ABG with hypoxia ~ place on BiPAP as pt is DNR/DNI -CXR with Low lung volumes. No consolidation or edema. No pleural effusion or pneumothorax. Cardiomediastinal contours are  within normal limits for technique. -Creatinine increased to 8.64 ~ plan to place temporary HD catheter and emergent HD per Nephrology request -Attempted to reach pt's mother to update and clarify goals  of care, unable to reach her, left voice message for callback   Objective   Blood pressure (!) 165/101, pulse 93, temperature 98.7 F (37.1 C), temperature source Axillary, resp. rate (!) 31, height 5\' 7"  (1.702 m), weight 67.1 kg, SpO2 97 %.        Intake/Output Summary (Last 24 hours) at 08/29/2020 1231 Last data filed at 08/29/2020 1015 Gross per 24 hour  Intake 1296.8 ml  Output 301 ml  Net 995.8 ml   Filed Weights   08/27/20 0100 08/28/20 0245 08/29/20 0337  Weight: 64.1 kg 66.8 kg 67.1 kg    Examination: General: Critically ill appearing female, laying in bed, on room air, with acute respiratory distress HENT: Atraumatic, normocephalic, neck supple, no JVD Lungs: Diminished bilaterally with slight expiratory wheezing to RML and RLL, tachypnea, assessory muscle use Cardiovascular: Regular rate and rhythm, s1s2, no M/R/G, 2+ distal pulses Abdomen: Soft, nontender, nondistended, no guarding or rebound tenderness Extremities: No deformities, no edema Neuro: Somnolent, purposeful movements to all extremities and withdraws to painful stimuli, but doesn't follow commands.  Pupils PERRL. Skin: Warm and dry.  No obvious rashes, lesions, or ulcerations  Resolved Hospital Problem list   N/A  Assessment & Plan:   Acute Hypoxic Respiratory Failure History of COPD & Asthma without Acute Exacerbation -Supplemental O2 as needed to maintain O2 sats 88 to 94% -Pt Is DNR/DNI (confirmed with mother); will place on BiPAP  -Follow intermittent CXR & ABG as needed -CXR on 2/16 with Low lung volumes. No consolidation or edema. No pleural effusion or pneumothorax. Cardiomediastinal contours are within normal limits for technique. -Prn Bronchodilators   Sepsis in the setting of Suspected  Meninigitis -Monitor fever curve -Trend WBC's & Procalcitonin -Follow cultures as above -ID following, ABX as per ID -CXR without evidence of Pnuemonia   Acute Metabolic Encephalopathy in the setting of suspected Meningitis +/- Uremia +/- concern for Overdose on home Cariprazine -Provide supportive care -Avoid sedating meds as able -Neurology, ID, and Psychiatry following, appreciate input -Continue ABX as per ID -Continue Thiamine and Vit. B12 -CT Head, MR Head negative, EEG without seizures -Urine drug screen positive for Tricyclics -Salicylates and Acetaminophen levels normals   AKI -Monitor I&O's / urinary output -Follow BMP -Ensure adequate renal perfusion -Avoid nephrotoxic agents as able -Replace electrolytes as indicated -Nephrology following, plan for emergent HD   Anemia -Monitor for S/Sx of bleeding -Trend CBC -SQ Heparin for VTE Prophylaxis  -Transfuse for Hgb <7   Best practice (evaluated daily)  Diet: NPO Pain/Anxiety/Delirium protocol (if indicated): NPO VAP protocol (if indicated): N/A DVT prophylaxis: Heparin SQ GI prophylaxis: N/A Glucose control: N/A Mobility: Bedrest Disposition:Stepdown  Goals of Care:  Last date of multidisciplinary goals of care discussion::N/A Family and staff present: Bedside RN present; attempted to reach pt's mother by telephone, left message for callback Summary of discussion: Plan for BiPAP and emergent HD Follow up goals of care discussion due: 08/30/2020 Code Status: DNR/DNI  Labs   CBC: Recent Labs  Lab 08/26/20 0508 08/27/20 0643 08/28/20 0902 08/29/20 1018  WBC 18.3* 15.3* 7.8 7.7  NEUTROABS 16.7*  --   --   --   HGB 14.9 11.8* 9.8* 10.0*  HCT 46.0 35.2* 29.6* 29.9*  MCV 94.7 92.6 91.4 91.2  PLT 475* 281 268 945    Basic Metabolic Panel: Recent Labs  Lab 08/26/20 0508 08/27/20 0643 08/28/20 0902 08/29/20 1018  NA 146* 145 144 143  K 4.8 5.4* 3.9 4.2  CL 112* 110  105 104  CO2 12* 17* 23 20*   GLUCOSE 148* 75 120* 94  BUN 43* 63* 87* 96*  CREATININE 3.53* 5.55* 6.92* 8.43*  CALCIUM 9.5 9.2 8.5* 8.7*  PHOS  --   --  5.8* 7.3*   GFR: Estimated Creatinine Clearance: 7 mL/min (A) (by C-G formula based on SCr of 8.43 mg/dL (H)). Recent Labs  Lab 08/26/20 0508 08/26/20 0640 08/26/20 1030 08/26/20 1451 08/27/20 0643 08/27/20 1235 08/27/20 1510 08/28/20 0902 08/29/20 1018  PROCALCITON 1.77  --   --   --   --   --   --   --   --   WBC 18.3*  --   --   --  15.3*  --   --  7.8 7.7  LATICACIDVEN  --    < > 2.3* 2.8*  --  3.0* 2.1*  --   --    < > = values in this interval not displayed.    Liver Function Tests: Recent Labs  Lab 08/26/20 0508 08/28/20 0902 08/29/20 1018  AST 46*  --   --   ALT 20  --   --   ALKPHOS 112  --   --   BILITOT 0.5  --   --   PROT 9.0*  --   --   ALBUMIN 4.5 2.9* 2.9*   No results for input(s): LIPASE, AMYLASE in the last 168 hours. Recent Labs  Lab 08/27/20 1557  AMMONIA 28    ABG    Component Value Date/Time   PHART 7.37 08/29/2020 1132   PCO2ART 38 08/29/2020 1132   PO2ART 63 (L) 08/29/2020 1132   HCO3 22.0 08/29/2020 1132   ACIDBASEDEF 3.0 (H) 08/29/2020 1132   O2SAT 91.0 08/29/2020 1132     Coagulation Profile: Recent Labs  Lab 08/28/20 0902  INR 1.1    Cardiac Enzymes: Recent Labs  Lab 08/27/20 0911  CKTOTAL 364*    HbA1C: No results found for: HGBA1C  CBG: Recent Labs  Lab 08/28/20 1449 08/29/20 1152  GLUCAP 96 83    Review of Systems:   Unable to assess due to Altered Mental Status and critical illness  Past Medical History:  She,  has a past medical history of Abdominal pain, Allergic genetic state, Anxiety, Asthma, Basal cell carcinoma, Bipolar disorder ([place]), Cancer ([place]), COPD (chronic obstructive pulmonary disease) ([place]), Depression, Eating disorder, Eczema, Encounter for blood transfusion, Essential tremor, 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   Surgical History:   Past Surgical History:  Procedure Laterality Date  . ABDOMINAL HYSTERECTOMY    . ADENOIDECTOMY    . BLADDER SURGERY    . bone grafts    . CATARACT EXTRACTION W/ INTRAOCULAR LENS IMPLANT    . CERVICAL FUSION    . COLONOSCOPY    . COLONOSCOPY WITH ESOPHAGOGASTRODUODENOSCOPY (EGD)    . COLONOSCOPY WITH PROPOFOL N/A 04/28/2018   Procedure: COLONOSCOPY WITH PROPOFOL;  Surgeon: Manya Silvas, MD;  Location: [facility] ENDOSCOPY;  Service: Endoscopy;  Laterality: N/A;  . EYE SURGERY    . GRAFT APPLICATION     in neck  . LAPAROSCOPIC BILATERAL SALPINGO OOPHERECTOMY    . NEUROPLASTY / TRANSPOSITION ULNAR NERVE AT ELBOW    . OOPHORECTOMY    . SKIN CANCER EXCISION    . skin grafts   left forearm; hand bil.lower extremeties  . titanium  plate    . TONSILLECTOMY    . TUBAL LIGATION       Social History:   reports that she quit smoking about 14 years ago. Her smoking use included cigarettes. She has never used smokeless tobacco. She reports that she does not drink alcohol and does not use drugs.   Family History:  Her family history includes Breast cancer in her mother; Lung cancer in her father.   Allergies Allergies  Allergen Reactions  . Codeine Nausea And Vomiting  . Prednisone      Home Medications  Prior to Admission medications   Medication Sig Start Date End Date Taking? Authorizing Provider  Carbidopa-Levodopa ER (SINEMET CR) 25-100 MG tablet controlled release Take 1-2 tablets by mouth 4 (four) times daily. Take one tablet by mouth at 7 am, one tablet at 12 pm, one tablet between 5-6 pm and two tablets at bedtime   Yes [provider]  amitriptyline (ELAVIL) 50 MG tablet Take 1-2 tablets by mouth at bedtime. 03/10/20   [provider]  carbidopa-levodopa  (SINEMET IR) 25-100 MG tablet Take 1-2 tablets by mouth 3 (three) times daily. Take two tablets by mouth at 7 am, two tablets at 12 pm and one tablet at 5 pm    [provider]  Cariprazine HCl 6 MG CAPS Take 6 mg by mouth daily at 12 noon. 03/30/20   [provider]  diazepam (VALIUM) 10 MG tablet Take 10 mg by mouth 3 (three) times daily as needed. 08/22/20   [provider]  DULoxetine (CYMBALTA) 60 MG capsule Take by mouth. 05/03/19   [provider]  EPINEPHrine 0.3 mg/0.3 mL IJ SOAJ injection Inject into the muscle once.    [provider]  Ipratropium-Albuterol (COMBIVENT) 20-100 MCG/ACT AERS respimat Inhale 1 puff into the lungs every 6 (six) hours.    [provider]  lamoTRIgine (LAMICTAL) 25 MG tablet Take 50 mg by mouth at bedtime. 08/14/20   [provider]  ondansetron (ZOFRAN) 8 MG tablet Take by mouth every 8 (eight) hours as needed for nausea or vomiting.    [provider]  propranolol ER (INDERAL LA) 60 MG 24 hr capsule Take 60 mg by mouth daily. 08/14/20   [provider]     Critical care time: 50 minutes    Darel Hong, [facility] [place] Pulmonary [facility] Critical Care Medicine Pager: [phone]

## 2020-08-29 NOTE — Procedures (Signed)
Patient having dialysis until evening time. Attempt tomorrow.

## 2020-08-29 NOTE — Progress Notes (Signed)
  Chaplain On-Call responded to Rapid Response notification.  Provided spiritual and emotional support for Rapid Response team members as they worked to stabilize the patient.  Patient is unable to respond verbally.  Nurses stated that the plan is to move the patient to the ICU bed 11.  [place] Stacy Pearson M.Div., [facility]

## 2020-08-29 NOTE — Progress Notes (Signed)
Date of Admission:  08/26/2020     ID: NICHOEL DIGIULIO is a 60 y.o. female  Principal Problem:   Delirium Active Problems:   Bipolar 1 disorder (HCC)   COPD (chronic obstructive pulmonary disease) (HCC)   Essential tremor   Migraine without aura   Parkinson's disease ([place])   Epilepsy ([place])   Depression with anxiety   HTN (hypertension)   Acute metabolic encephalopathy   UTI (urinary tract infection)   AKI (acute kidney injury) ([place])   Severe sepsis with septic shock (HCC)    Subjective: Spoke to her mom and got more history. Pt apparently has been lashin out at her and fighting with her a lot. mOm thinks that is because she has breast cancer and patient is afraid she may die. On Friday she stormed into her room and locked the door- she usually does that once or twice a month- Mom knocked on the door the next morning and as she was not oening she removed the door knob and went inside an dfound her 2 dogs and took them put. Pt was mumbling something and aksed her to leave. Mom went back at 11pm and saw her sleeping- At 3 am on Sunday morning when she went inside to get the dogs she found the patient lying in the bed in the same position, her breathing was not good and she had wet the bed. Hence she called EMS As per mom pt was fine until Friday- no fever, no headache, no sore throat, no cough, no diarrhea, no pain abdomen  Medications:  . carbidopa-levodopa  1 tablet Oral Daily  . carbidopa-levodopa  2 tablet Oral BID  . Carbidopa-Levodopa ER  1 tablet Oral TID  . Carbidopa-Levodopa ER  2 tablet Oral QHS  . Chlorhexidine Gluconate Cloth  6 each Topical Q0600  . vitamin B-12  1,000 mcg Oral Daily   Or  . cyanocobalamin  1,000 mcg Intramuscular Daily  . heparin injection (subcutaneous)  5,000 Units Subcutaneous Q8H  . Ipratropium-Albuterol  1 puff Inhalation Q6H  . lamoTRIgine  25 mg Oral BID  . propranolol ER  60 mg Oral Daily  . [START ON 09/04/2020] thiamine injection  100 mg  Intravenous Daily  . vancomycin variable dose per unstable renal function (pharmacist dosing)   Does not apply See admin instructions    Objective: Vital signs in last 24 hours: Patient Vitals for the past 24 hrs:  BP Temp Temp src Pulse Resp SpO2 Weight  08/29/20 1128 -- -- -- 95 -- -- --  08/29/20 0738 (!) 158/88 98.7 F (37.1 C) Axillary 94 20 93 % --  08/29/20 0400 (!) 151/81 97.7 F (36.5 C) Oral 89 20 95 % --  08/29/20 0337 -- -- -- -- -- -- 67.1 kg  08/28/20 2351 (!) 152/83 98.8 F (37.1 C) Oral 85 19 94 % --  08/28/20 2034 -- -- -- 83 -- 94 % --  08/28/20 2026 (!) 148/81 97.8 F (36.6 C) Oral 83 18 (!) 68 % --  08/28/20 1537 (!) 145/95 98.1 F (36.7 C) -- 83 17 92 % --   PHYSICAL EXAM:  General: unresponsive on bipap Head: Normocephalic, without obvious abnormality, atraumatic. Eyes: Conjunctivae clear, anicteric sclerae. Pupils are equal ENT cannot examine Neck: Supple Back: No CVA tenderness. Lungs: b/la ir entry Heart: Regular rate and rhythm, no murmur, rub or gallop. Abdomen: Soft, non-tender,not distended. Bowel sounds normal. No masses Extremities: atraumatic, no cyanosis. No edema. No clubbing Skin: No  rashes or lesions. Or bruising Lymph: Cervical, supraclavicular normal. Neurologic: cannot assess Lab Results Recent Labs    08/28/20 0902 08/29/20 1018  WBC 7.8 7.7  HGB 9.8* 10.0*  HCT 29.6* 29.9*  NA 144 143  K 3.9 4.2  CL 105 104  CO2 23 20*  BUN 87* 96*  CREATININE 6.92* 8.43*   Liver Panel Recent Labs    08/28/20 0902 08/29/20 1018  ALBUMIN 2.9* 2.9*   Sedimentation Rate No results for input(s): ESRSEDRATE in the last 72 hours. C-Reactive Protein No results for input(s): CRP in the last 72 hours.  Microbiology:  Studies/Results: EEG  Result Date: 08/28/2020 Lora Havens, MD     08/28/2020  3:40 PM Patient Name: RUTHER EPHRAIM MRN: [id number] Epilepsy Attending: Lora Havens Referring Physician/Provider: Dr Malvin Johns Date: 08/28/2020 Duration: 26.12 mins Patient history: 60yo F with ams. EEG to evaluate for seizure Level of alertness: Awake AEDs during EEG study: LTG, LRZ Technical aspects: This EEG study was done with scalp electrodes positioned according to the 10-20 International system of electrode placement. Electrical activity was acquired at a sampling rate of 500Hz  and reviewed with a high frequency filter of 70Hz  and a low frequency filter of 1Hz . EEG data were recorded continuously and digitally stored. Description: No posterior dominant rhythm was seen. EEG showed continuous generalized 3 to 6 Hz theta-delta slowing. Generalized periodic discharges with triphasic morphology at 1hz  were also noted. Physiologic photic driving was not seen during photic stimulation.  Hyperventilation  Was not performed.   ABNORMALITY -Continuous slow, generalized -Periodic discharges with triphasic morphology, generalized IMPRESSION: This study is suggestive of moderate diffuse encephalopathy, nonspecific etiology. Given the morphology and frequency of periodic discharges with triphasic morphology are most likely due to toxic-metabolic causes. No seizures or definite epileptiform discharges were seen throughout the recording. Lora Havens   MR BRAIN WO CONTRAST  Result Date: 08/28/2020 CLINICAL DATA:  Sepsis.  Possible bacterial meningitis. EXAM: MRI HEAD WITHOUT CONTRAST TECHNIQUE: Multiplanar, multiecho pulse sequences of the brain and surrounding structures were obtained without intravenous contrast. COMPARISON:  None. FINDINGS: Severely motion degraded examination. Brain: No acute infarct, mass effect or extra-axial collection. No acute or chronic hemorrhage. Normal white matter signal, parenchymal volume and CSF spaces. The midline structures are normal. Vascular: Major flow voids are preserved. Skull and upper cervical spine: Normal calvarium and skull base. Visualized upper cervical spine and soft tissues are  normal. Sinuses/Orbits:No paranasal sinus fluid levels or advanced mucosal thickening. No mastoid or middle ear effusion. Normal orbits. IMPRESSION: 1. Severely motion degraded examination. 2. Within that limitation, no acute intracranial abnormality. Electronically Signed   By: Ulyses Jarred M.D.   On: 08/28/2020 23:01   DG FLUORO GUIDED LOC OF NEEDLE/CATH TIP FOR SPINAL INJECT RT  Result Date: 08/28/2020 CLINICAL DATA:  Altered mental status EXAM: DIAGNOSTIC LUMBAR PUNCTURE UNDER FLUOROSCOPIC GUIDANCE FLUOROSCOPY TIME:  Fluoroscopy Time:  18 seconds Radiation Exposure Index (if provided by the fluoroscopic device): 3.8 mGy PROCEDURE: Informed consent was obtained from the patient prior to the procedure, including potential complications of headache, allergy, and pain. With the patient prone, the lower back was prepped with Betadine. 1% Lidocaine was used for local anesthesia. Lumbar puncture was performed at the L4-L5 level using a 22 gauge needle with return of clear CSF. 10 ml of CSF were obtained for laboratory studies. The patient tolerated the procedure well and there were no apparent complications. IMPRESSION: Technically successful fluoroscopy guided lumbar puncture. Electronically Signed  By: Macy Mis M.D.   On: 08/28/2020 12:35     Assessment/Plan:  60 year old female apparently well until Friday except at baseline bipolar disorder for which she is always angry at her mother stormed into her room on Friday and found on Sunday morning unresponsive, breathing difficulty and wet her bed. Mom thinks patient could have taken a lot of cariprazine tablet   Acute encephalopathy/unresponsiveness likely due to medication .  She is on antipsychotic medication cariprazine and Parkinson's medication, valium Possibly she had seizures. She is in acute kidney injury the etiology of which is unclear.  Creatinine is worsening.  Her CPK is minimally elevated and that does not explain the AKI.  If she  had neuroleptic malignant syndrome that should cause rhabdomyolysis but she does not have it..  CNS infection :meningitis or encephalitis less likely, never had any prodromal symptoms.  CSF findings are abnormal with minimal minimal pleocytosis which is neutrophilic with minimally elevated protein.  Could be post ictal. We will discontinue acyclovir as MRI has no temporal lobe enhancement, EEG shows no PLEDs.  We will also discontinue vancomycin. She is currently on ceftriaxone meningitis dose , will continue till cultures are back Urine culture, blood culture, chest x-ray have all been normal   Anemia  Discussed the management with care team

## 2020-08-29 NOTE — Procedures (Signed)
Central Venous Catheter Insertion Procedure Note  Stacy Pearson  [id number]  October 15, 1960  Date:08/29/20  Time:1:37 PM   Provider Performing:Tranisha Tissue D Dewaine Conger   Procedure: Insertion of Non-tunneled Central Venous Catheter(36556)with US guidance (50518)    Indication(s) Hemodialysis  Consent Unable to obtain consent due to emergent nature of procedure.  Anesthesia Topical only with 1% lidocaine   Timeout Verified patient identification, verified procedure, site/side was marked, verified correct patient position, special equipment/implants available, medications/allergies/relevant history reviewed, required imaging and test results available.  Sterile Technique Maximal sterile technique including full sterile barrier drape, hand hygiene, sterile gown, sterile gloves, mask, hair covering, sterile ultrasound probe cover (if used).  Procedure Description Area of catheter insertion was cleaned with chlorhexidine and draped in sterile fashion.   With real-time ultrasound guidance a HD catheter was placed into the right femoral vein.  Nonpulsatile blood flow and easy flushing noted in all ports.  The catheter was sutured in place and sterile dressing applied.  Complications/Tolerance None; patient tolerated the procedure well. Chest X-ray is ordered to verify placement for internal jugular or subclavian cannulation.  Chest x-ray is not ordered for femoral cannulation.  EBL Minimal  Specimen(s) None   Line secured at the 30 cm mark.  BIOPATCH applied to the insertion site.    Darel Hong, AGACNP-BC [place] Pulmonary [facility] Critical [facility] Medicine Pager: [phone]

## 2020-08-30 DIAGNOSIS — N179 Acute kidney failure, unspecified: Secondary | ICD-10-CM | POA: Diagnosis not present

## 2020-08-30 DIAGNOSIS — F319 Bipolar disorder, unspecified: Secondary | ICD-10-CM | POA: Diagnosis not present

## 2020-08-30 DIAGNOSIS — J9601 Acute respiratory failure with hypoxia: Secondary | ICD-10-CM | POA: Diagnosis not present

## 2020-08-30 DIAGNOSIS — G9340 Encephalopathy, unspecified: Secondary | ICD-10-CM

## 2020-08-30 DIAGNOSIS — G9341 Metabolic encephalopathy: Secondary | ICD-10-CM | POA: Diagnosis not present

## 2020-08-30 DIAGNOSIS — R410 Disorientation, unspecified: Secondary | ICD-10-CM | POA: Diagnosis not present

## 2020-08-30 LAB — HSV 1/2 PCR, CSF
HSV-1 DNA: NEGATIVE
HSV-2 DNA: NEGATIVE

## 2020-08-30 LAB — HEPATIC FUNCTION PANEL
ALT: <5 U/L (ref 0–44)
AST: 20 U/L (ref 15–41)
Albumin: 2.7 g/dL — ABNORMAL LOW (ref 3.5–5.0)
Alkaline Phosphatase: 75 U/L (ref 38–126)
Bilirubin, Direct: <0.1 mg/dL (ref 0.0–0.2)
Total Bilirubin: 0.6 mg/dL (ref 0.3–1.2)
Total Protein: 6.5 g/dL (ref 6.5–8.1)

## 2020-08-30 LAB — CBC
HCT: 28.9 % — ABNORMAL LOW (ref 36.0–46.0)
Hemoglobin: 9.7 g/dL — ABNORMAL LOW (ref 12.0–15.0)
MCH: 30.9 pg (ref 26.0–34.0)
MCHC: 33.6 g/dL (ref 30.0–36.0)
MCV: 92 fL (ref 80.0–100.0)
Platelets: 249 10*3/uL (ref 150–400)
RBC: 3.14 MIL/uL — ABNORMAL LOW (ref 3.87–5.11)
RDW: 14.1 % (ref 11.5–15.5)
WBC: 6.3 10*3/uL (ref 4.0–10.5)
nRBC: 0 % (ref 0.0–0.2)

## 2020-08-30 LAB — RENAL FUNCTION PANEL
Albumin: 2.7 g/dL — ABNORMAL LOW (ref 3.5–5.0)
Anion gap: 20 — ABNORMAL HIGH (ref 5–15)
BUN: 77 mg/dL — ABNORMAL HIGH (ref 6–20)
CO2: 22 mmol/L (ref 22–32)
Calcium: 8.8 mg/dL — ABNORMAL LOW (ref 8.9–10.3)
Chloride: 101 mmol/L (ref 98–111)
Creatinine, Ser: 7.49 mg/dL — ABNORMAL HIGH (ref 0.44–1.00)
GFR, Estimated: 6 mL/min — ABNORMAL LOW (ref >60)
Glucose, Bld: 80 mg/dL (ref 70–99)
Phosphorus: 7.7 mg/dL — ABNORMAL HIGH (ref 2.5–4.6)
Potassium: 4.1 mmol/L (ref 3.5–5.1)
Sodium: 143 mmol/L (ref 135–145)

## 2020-08-30 LAB — GLUCOSE, CAPILLARY: Glucose-Capillary: 95 mg/dL (ref 70–99)

## 2020-08-30 LAB — HEPATITIS B DNA, ULTRAQUANTITATIVE, PCR
HBV DNA SERPL PCR-ACNC: NOT DETECTED IU/mL
HBV DNA SERPL PCR-LOG IU: UNDETERMINED log10 IU/mL

## 2020-08-30 NOTE — Progress Notes (Signed)
eeg done °

## 2020-08-30 NOTE — Plan of Care (Signed)
EEG pending. Nephrology on board for dialysis; Will follow EEG and exam with you as metabolic derangements improve.  -- Amie Portland, MD Neurologist

## 2020-08-30 NOTE — Progress Notes (Signed)
Date of Admission:  08/26/2020     ID: Stacy Pearson is a 60 y.o. female  Principal Problem:   Delirium Active Problems:   Bipolar 1 disorder (HCC)   COPD (chronic obstructive pulmonary disease) (HCC)   Essential tremor   Migraine without aura   Parkinson's disease ([place])   Epilepsy ([place])   Depression with anxiety   HTN (hypertension)   Acute metabolic encephalopathy   UTI (urinary tract infection)   AKI (acute kidney injury) ([place])   Severe sepsis with septic shock ([place])    Subjective: Got dialysis Patient is lying with her eyes open on calling her name she responds and says she is okay She does not follow commands very well.  Medications:  . carbidopa-levodopa  1 tablet Oral Daily  . carbidopa-levodopa  2 tablet Oral BID  . Carbidopa-Levodopa ER  1 tablet Oral TID  . Carbidopa-Levodopa ER  2 tablet Oral QHS  . Chlorhexidine Gluconate Cloth  6 each Topical Q0600  . Chlorhexidine Gluconate Cloth  6 each Topical Q0600  . vitamin B-12  1,000 mcg Oral Daily   Or  . cyanocobalamin  1,000 mcg Intramuscular Daily  . heparin injection (subcutaneous)  5,000 Units Subcutaneous Q8H  . Ipratropium-Albuterol  1 puff Inhalation Q6H  . lamoTRIgine  25 mg Oral BID  . propranolol ER  60 mg Oral Daily  . [START ON 09/04/2020] thiamine injection  100 mg Intravenous Daily    Objective: Vital signs in last 24 hours: Temp:  [97.7 F (36.5 C)-99 F (37.2 C)] 98.7 F (37.1 C) (02/17 1253) Pulse Rate:  [79-100] 91 (02/17 1515) Resp:  [18-29] 21 (02/17 1515) BP: (135-168)/(74-99) 166/93 (02/17 1515) SpO2:  [97 %-100 %] 98 % (02/17 1515)  PHYSICAL EXAM:  General: Patient little more awake today.  Minimally verbal.  Follows some simple commands  Has BiPAP Lungs: Bilateral air entry Heart: S1-S2 Abdomen: Soft, non-tender,not distended. Bowel sounds normal. No masses Extremities: atraumatic, no cyanosis. No edema. No clubbing Skin: No rashes or lesions. Or bruising Lymph:  Cervical, supraclavicular normal. Neurologic cannot be assessed  Lab Results Recent Labs    08/29/20 1018 08/30/20 1000  WBC 7.7 6.3  HGB 10.0* 9.7*  HCT 29.9* 28.9*  NA 143 143  K 4.2 4.1  CL 104 101  CO2 20* 22  BUN 96* 77*  CREATININE 8.43* 7.49*   Liver Panel Recent Labs    08/29/20 1018 08/30/20 1000  ALBUMIN 2.9* 2.7*   Sedimentation Rate No results for input(s): ESRSEDRATE in the last 72 hours. C-Reactive Protein No results for input(s): CRP in the last 72 hours.  Microbiology:  Studies/Results: EEG  Result Date: 08/30/2020 Lora Havens, MD     08/30/2020  4:01 PM Patient Name: Stacy Pearson MRN: [id number] Epilepsy Attending: Lora Havens Referring Physician/Provider: Dr Malvin Johns Date: 08/30/2020 Duration: 33.37  mins  Patient history: 60yo F with ams. EEG to evaluate for seizure  Level of alertness: Awake  AEDs during EEG study: LTG, LRZ  Technical aspects: This EEG study was done with scalp electrodes positioned according to the 10-20 International system of electrode placement. Electrical activity was acquired at a sampling rate of 500Hz  and reviewed with a high frequency filter of 70Hz  and a low frequency filter of 1Hz . EEG data were recorded continuously and digitally stored.  Description: No posterior dominant rhythm was seen. EEG showed continuous generalized 3 to 6 Hz theta-delta slowing.  Hyperventilation and photic stimulation were not performed.  ABNORMALITY -Continuous slow, generalized  IMPRESSION: This study is suggestive of moderate diffuse encephalopathy, nonspecific etiology. No seizures or epileptiform discharges were seen throughout the recording.  Lora Havens   MR BRAIN WO CONTRAST  Result Date: 08/28/2020 CLINICAL DATA:  Sepsis.  Possible bacterial meningitis. EXAM: MRI HEAD WITHOUT CONTRAST TECHNIQUE: Multiplanar, multiecho pulse sequences of the brain and surrounding structures were obtained without intravenous  contrast. COMPARISON:  None. FINDINGS: Severely motion degraded examination. Brain: No acute infarct, mass effect or extra-axial collection. No acute or chronic hemorrhage. Normal white matter signal, parenchymal volume and CSF spaces. The midline structures are normal. Vascular: Major flow voids are preserved. Skull and upper cervical spine: Normal calvarium and skull base. Visualized upper cervical spine and soft tissues are normal. Sinuses/Orbits:No paranasal sinus fluid levels or advanced mucosal thickening. No mastoid or middle ear effusion. Normal orbits. IMPRESSION: 1. Severely motion degraded examination. 2. Within that limitation, no acute intracranial abnormality. Electronically Signed   By: Ulyses Jarred M.D.   On: 08/28/2020 23:01   DG Chest Port 1 View  Result Date: 08/29/2020 CLINICAL DATA:  Altered mental status with shortness of breath EXAM: PORTABLE CHEST 1 VIEW COMPARISON:  08/26/2020 FINDINGS: Low lung volumes. No consolidation or edema. No pleural effusion or pneumothorax. Cardiomediastinal contours are within normal limits for technique. IMPRESSION: No acute process in the chest. Electronically Signed   By: Macy Mis M.D.   On: 08/29/2020 14:15     Assessment/Plan: Acute encephalopathy/unresponsiveness due to medication.  There was a question of overdose.  She was on antipsychotic medication cariprazine and Valium and Parkinson's medication. Slight improvement in mentation after dialysis Seizure/postictal phase 1st question.  Repeat EEG shows diffuse changes but not seizure activity.  Lumbar puncture was done and the CSF analysis was minimal pleocytosis.  The clinical picture was not typical for herpes encephalitis and hence acyclovir was stopped.  Also MRI did not have any temporal lobe enhancement, EEG had no PLEDs. She is currently only on ceftriaxone 2 g every 12. CSF culture so far no growth.  If tomorrow the CSF culture remains negative then I would de-escalate the  ceftriaxone to once a day dose  AKI: Unclear etiology possibly due to medications Getting dialysis  Bipolar disorder  Discussed the management with the care team.

## 2020-08-30 NOTE — Progress Notes (Signed)
[place], [place] 08/30/20  Subjective:  [name] is a 60 y.o. white female with  bipolar, depression, anxiety, parkinson's, epilepsy, HTN, and COPD. She presents to the ED with AMS. Concern for overdose (cariprazine+alprazolam) versus urinary tract infection with sepsis.    Initiated on hemodialysis treatment yesterday due to worsening renal injury. Tolerated treatment well.  UOP 235.   Second treatment today.   Objective:  Vital signs in last 24 hours:  Temp:  [97.7 F (36.5 C)-99 F (37.2 C)] 99 F (37.2 C) (02/17 1015) Pulse Rate:  [79-100] 94 (02/17 1145) Resp:  [7-48] 19 (02/17 1145) BP: (135-170)/(74-101) 151/88 (02/17 1145) SpO2:  [90 %-100 %] 98 % (02/17 1145)  Weight change:  Filed Weights   08/27/20 0100 08/28/20 0245 08/29/20 0337  Weight: 64.1 kg 66.8 kg 67.1 kg    Intake/Output:    Intake/Output Summary (Last 24 hours) at 08/30/2020 1211 Last data filed at 08/30/2020 0600 Gross per 24 hour  Intake 499.84 ml  Output 185 ml  Net 314.84 ml    Physical Exam: General: Ill appearing, laying in bed  Head: BIPAP  Eyes: Anicteric  Neck: Supple, trachea midline  Lungs:  Clear to auscultation  Heart: Regular rate and rhythm  Abdomen:  Soft, nontender,   Extremities:  no peripheral edema.  Neurologic: Not following commands, incoherent speech  Skin: No lesions  Access: Right femoral temp HD catheter 2/53   Basic Metabolic Panel:  Recent Labs  Lab 08/26/20 0508 08/27/20 0643 08/28/20 0902 08/29/20 1018 08/30/20 1000  NA 146* 145 144 143 143  K 4.8 5.4* 3.9 4.2 4.1  CL 112* 110 105 104 101  CO2 12* 17* 23 20* 22  GLUCOSE 148* 75 120* 94 80  BUN 43* 63* 87* 96* 77*  CREATININE 3.53* 5.55* 6.92* 8.43* 7.49*  CALCIUM 9.5 9.2 8.5* 8.7* 8.8*  PHOS  --   --  5.8* 7.3* 7.7*     CBC: Recent Labs  Lab 08/26/20 0508 08/27/20 0643 08/28/20 0902 08/29/20 1018 08/30/20 1000  WBC 18.3* 15.3* 7.8 7.7 6.3   NEUTROABS 16.7*  --   --   --   --   HGB 14.9 11.8* 9.8* 10.0* 9.7*  HCT 46.0 35.2* 29.6* 29.9* 28.9*  MCV 94.7 92.6 91.4 91.2 92.0  PLT 475* 281 268 285 249      Lab Results  Component Value Date   HEPBSAG NON REACTIVE 08/29/2020   HEPBSAB NON REACTIVE 08/29/2020   HEPBIGM NON REACTIVE 08/29/2020      Microbiology:  Recent Results (from the past 240 hour(s))  Resp Panel by RT-PCR (Flu A&B, Covid) Urine, Clean Catch     Status: None   Collection Time: 08/26/20  5:08 AM   Specimen: Urine, Clean Catch; Nasopharyngeal(NP) swabs in vial transport medium  Result Value Ref Range Status   SARS Coronavirus 2 by RT PCR NEGATIVE NEGATIVE Final    Comment: (NOTE) SARS-CoV-2 target nucleic acids are NOT DETECTED.  The SARS-CoV-2 RNA is generally detectable in upper respiratory specimens during the acute phase of infection. The lowest concentration of SARS-CoV-2 viral copies this assay can detect is 138 copies/mL. A negative result does not preclude SARS-Cov-2 infection and should not be used as the sole basis for treatment or other patient management decisions. A negative result may occur with  improper specimen collection/handling, submission of specimen other than nasopharyngeal swab, presence of viral mutation(s) within the areas targeted by this assay, and inadequate number  of viral copies(<138 copies/mL). A negative result must be combined with clinical observations, patient history, and epidemiological information. The expected result is Negative.  Fact Sheet for Patients:  EntrepreneurPulse.com.au  Fact Sheet for Healthcare Providers:  IncredibleEmployment.be  This test is no t yet approved or cleared by the [place] FDA and  has been authorized for detection and/or diagnosis of SARS-CoV-2 by FDA under an Emergency Use Authorization (EUA). This EUA will remain  in effect (meaning this test can be used) for the duration of  the COVID-19 declaration under Section 564(b)(1) of the Act, 21 U.S.C.section 360bbb-3(b)(1), unless the authorization is terminated  or revoked sooner.       Influenza A by PCR NEGATIVE NEGATIVE Final   Influenza B by PCR NEGATIVE NEGATIVE Final    Comment: (NOTE) The Xpert Xpress SARS-CoV-2/FLU/RSV plus assay is intended as an aid in the diagnosis of influenza from Nasopharyngeal swab specimens and should not be used as a sole basis for treatment. Nasal washings and aspirates are unacceptable for Xpert Xpress SARS-CoV-2/FLU/RSV testing.  Fact Sheet for Patients: EntrepreneurPulse.com.au  Fact Sheet for Healthcare Providers: IncredibleEmployment.be  This test is not yet approved or cleared by the [place] FDA and has been authorized for detection and/or diagnosis of SARS-CoV-2 by FDA under an Emergency Use Authorization (EUA). This EUA will remain in effect (meaning this test can be used) for the duration of the COVID-19 declaration under Section 564(b)(1) of the Act, 21 U.S.C. section 360bbb-3(b)(1), unless the authorization is terminated or revoked.  Performed at [facility], [address] [place], [place] [address]   Urine culture     Status: None   Collection Time: 08/26/20  5:54 AM   Specimen: Urine, Clean Catch  Result Value Ref Range Status   Specimen Description   Final    URINE, CLEAN CATCH Performed at [facility], [address], [place], [place] [address]    Special Requests   Final    NONE Performed at [facility], [address], [place], [place] [address]    Culture   Final    NO GROWTH Performed at [facility], [place] [address] [place], [place] [address]    Report Status 08/27/2020 FINAL  Final  Culture, blood (routine x 2)     Status: None (Preliminary result)   Collection Time: 08/26/20  6:03 AM   Specimen: Left Antecubital; Blood  Result Value Ref Range Status    Specimen Description LEFT ANTECUBITAL  Final   Special Requests   Final    BOTTLES DRAWN AEROBIC AND ANAEROBIC Blood Culture adequate volume   Culture   Final    NO GROWTH 4 DAYS Performed at [facility], [address], [place], [place] [address]    Report Status PENDING  Incomplete  Culture, blood (routine x 2)     Status: None (Preliminary result)   Collection Time: 08/26/20  8:55 AM   Specimen: BLOOD LEFT FOREARM  Result Value Ref Range Status   Specimen Description BLOOD LEFT FOREARM  Final   Special Requests   Final    BOTTLES DRAWN AEROBIC AND ANAEROBIC Blood Culture adequate volume   Culture   Final    NO GROWTH 4 DAYS Performed at [facility], [address] [place], [place] [address]    Report Status PENDING  Incomplete  CSF culture w Gram Stain     Status: None (Preliminary result)   Collection Time: 08/28/20 12:01 PM  Specimen: PATH Cytology CSF; Cerebrospinal Fluid  Result Value Ref Range Status   Specimen Description   Final    CSF Performed at [facility], [address] [place], [place] [address]    Special Requests   Final    NONE Performed at [facility], [place] [address] [place], [place] [address]    Gram Stain   Final    WBC SEEN RED BLOOD CELLS NO ORGANISMS SEEN Performed at [facility], [address] [place], [place] [address]    Culture   Final    NO GROWTH 2 DAYS Performed at [facility], [place] [address] [place], [place] [address]    Report Status PENDING  Incomplete  Culture, fungus without smear     Status: None (Preliminary result)   Collection Time: 08/28/20 12:01 PM   Specimen: PATH Cytology CSF; Cerebrospinal Fluid  Result Value Ref Range Status   Specimen Description   Final    CSF Performed at [facility], [address] [place], [place] [address]    Special Requests NONE  Final   Culture   Final    NO FUNGUS ISOLATED AFTER 2 DAYS Performed at [facility], [place] [address], [place], [place] [address]    Report Status PENDING  Incomplete    Coagulation Studies: Recent Labs    08/28/20 0902  LABPROT 13.3  INR 1.1    Urinalysis: No results for input(s): COLORURINE, LABSPEC, PHURINE, GLUCOSEU, HGBUR, BILIRUBINUR, KETONESUR, PROTEINUR, UROBILINOGEN, NITRITE, LEUKOCYTESUR in the last 72 hours.  Invalid input(s): APPERANCEUR    Imaging: EEG  Result Date: 08/28/2020 Lora Havens, MD     08/28/2020  3:40 PM Patient Name: CAMARI QUINTANILLA MRN: [id number] Epilepsy Attending: Lora Havens Referring Physician/Provider: Dr Malvin Johns Date: 08/28/2020 Duration: 26.12 mins Patient history: 60yo F with ams. EEG to evaluate for seizure Level of alertness: Awake AEDs during EEG study: LTG, LRZ Technical aspects: This EEG study was done with scalp electrodes positioned according to the 10-20 International system of electrode placement. Electrical activity was acquired at a sampling rate of 500Hz  and reviewed with a high frequency filter of 70Hz  and a low frequency filter of 1Hz . EEG data were recorded continuously and digitally stored. Description: No posterior dominant rhythm was seen. EEG showed continuous generalized 3 to 6 Hz theta-delta slowing. Generalized periodic discharges with triphasic morphology at 1hz  were also noted. Physiologic photic driving was not seen during photic stimulation.  Hyperventilation  Was not performed.   ABNORMALITY -Continuous slow, generalized -Periodic discharges with triphasic morphology, generalized IMPRESSION: This study is suggestive of moderate diffuse encephalopathy, nonspecific etiology. Given the morphology and frequency of periodic discharges with triphasic morphology are most likely due to toxic-metabolic causes. No seizures or definite epileptiform discharges were seen throughout the recording. Lora Havens   MR BRAIN WO CONTRAST  Result Date: 08/28/2020 CLINICAL DATA:  Sepsis.  Possible  bacterial meningitis. EXAM: MRI HEAD WITHOUT CONTRAST TECHNIQUE: Multiplanar, multiecho pulse sequences of the brain and surrounding structures were obtained without intravenous contrast. COMPARISON:  None. FINDINGS: Severely motion degraded examination. Brain: No acute infarct, mass effect or extra-axial collection. No acute or chronic hemorrhage. Normal white matter signal, parenchymal volume and CSF spaces. The midline structures are normal. Vascular: Major flow voids are preserved. Skull and upper cervical spine: Normal calvarium and skull base. Visualized upper cervical spine and soft tissues are normal. Sinuses/Orbits:No paranasal sinus fluid levels or advanced mucosal thickening.  No mastoid or middle ear effusion. Normal orbits. IMPRESSION: 1. Severely motion degraded examination. 2. Within that limitation, no acute intracranial abnormality. Electronically Signed   By: Ulyses Jarred M.D.   On: 08/28/2020 23:01   DG Chest Port 1 View  Result Date: 08/29/2020 CLINICAL DATA:  Altered mental status with shortness of breath EXAM: PORTABLE CHEST 1 VIEW COMPARISON:  08/26/2020 FINDINGS: Low lung volumes. No consolidation or edema. No pleural effusion or pneumothorax. Cardiomediastinal contours are within normal limits for technique. IMPRESSION: No acute process in the chest. Electronically Signed   By: Macy Mis M.D.   On: 08/29/2020 14:15   DG FLUORO GUIDED LOC OF NEEDLE/CATH TIP FOR SPINAL INJECT RT  Result Date: 08/28/2020 CLINICAL DATA:  Altered mental status EXAM: DIAGNOSTIC LUMBAR PUNCTURE UNDER FLUOROSCOPIC GUIDANCE FLUOROSCOPY TIME:  Fluoroscopy Time:  18 seconds Radiation Exposure Index (if provided by the fluoroscopic device): 3.8 mGy PROCEDURE: Informed consent was obtained from the patient prior to the procedure, including potential complications of headache, allergy, and pain. With the patient prone, the lower back was prepped with Betadine. 1% Lidocaine was used for local anesthesia.  Lumbar puncture was performed at the L4-L5 level using a 22 gauge needle with return of clear CSF. 10 ml of CSF were obtained for laboratory studies. The patient tolerated the procedure well and there were no apparent complications. IMPRESSION: Technically successful fluoroscopy guided lumbar puncture. Electronically Signed   By: Macy Mis M.D.   On: 08/28/2020 12:35     Medications:   . cefTRIAXone (ROCEPHIN)  IV Stopped (08/29/20 2258)  . lactated ringers Stopped (08/29/20 1147)  . thiamine injection     . carbidopa-levodopa  1 tablet Oral Daily  . carbidopa-levodopa  2 tablet Oral BID  . Carbidopa-Levodopa ER  1 tablet Oral TID  . Carbidopa-Levodopa ER  2 tablet Oral QHS  . Chlorhexidine Gluconate Cloth  6 each Topical Q0600  . Chlorhexidine Gluconate Cloth  6 each Topical Q0600  . vitamin B-12  1,000 mcg Oral Daily   Or  . cyanocobalamin  1,000 mcg Intramuscular Daily  . heparin injection (subcutaneous)  5,000 Units Subcutaneous Q8H  . Ipratropium-Albuterol  1 puff Inhalation Q6H  . lamoTRIgine  25 mg Oral BID  . propranolol ER  60 mg Oral Daily  . [START ON 09/04/2020] thiamine injection  100 mg Intravenous Daily   acetaminophen **OR** acetaminophen, albuterol, LORazepam, ondansetron (ZOFRAN) IV  Assessment/ Plan:  60 y.o. female with PMHX of bipolar, depression, anxiety, parkinson's, epilepsy, HTN, and COPD was admitted on 08/26/2020   Principal Problem:   Delirium Active Problems:   Bipolar 1 disorder (HCC)   COPD (chronic obstructive pulmonary disease) (HCC)   Essential tremor   Migraine without aura   Parkinson's disease ([place])   Epilepsy ([place])   Depression with anxiety   HTN (hypertension)   Acute metabolic encephalopathy   UTI (urinary tract infection)   AKI (acute kidney injury) ([place])   Severe sepsis with septic shock (HCC)  Confusion [R41.0] Severe sepsis with septic shock ([place]) [A41.9, R65.21] Severe sepsis ([place]) [A41.9, R65.20] Altered mental status,  unspecified altered mental status type [R41.82] Sepsis, due to unspecified organism, unspecified whether acute organ dysfunction present ([place]) [A41.9]  #. Acute kidney injury with hyperkalemia and metabolic acidosis: baseline creatinine 0.8 with normal GFR on 01/31/2020 - monitor volume status.  - Continues to need renal replacement therapy. Tolerating intermittent hemodialysis treatment today. Plan on third dialysis treatment today.   #Hyperkalemia: improved with bicarb infusion.   #  Anion gap metabolic acidosis: Salicylate levels negative, acetaminophen levels negative. Normal glucose. Lactic acidosis has improved.  Most likely due to uremia - renal replacement as above.   # Hypernatremia: secondary to free water deficit.  - improved with dialysis   LOS: 4 Lavonia Dana 2/17/202212:11 PM  [place], [place]

## 2020-08-30 NOTE — Progress Notes (Signed)
   08/29/20 1128  Assess: MEWS Score  BP (!) 156/85  Pulse Rate 95  ECG Heart Rate 95  Resp (!) 40  Level of Consciousness Unresponsive  SpO2 91 %  O2 Device Room Air  Assess: MEWS Score  MEWS Temp 0  MEWS Systolic 0  MEWS Pulse 0  MEWS RR 3  MEWS LOC 3  MEWS Score 6  MEWS Score Color Red  Assess: if the MEWS score is Yellow or Red  Were vital signs taken at a resting state? Yes  Focused Assessment Change from prior assessment (see assessment flowsheet)  Early Detection of Sepsis Score *See Row Information* Medium  MEWS guidelines implemented *See Row Information* Yes  Treat  MEWS Interventions Escalated (See documentation below)  Take Vital Signs  Increase Vital Sign Frequency  Red: Q 1hr X 4 then Q 4hr X 4, if remains red, continue Q 4hrs  Escalate  MEWS: Escalate Red: discuss with charge nurse/RN and provider, consider discussing with RRT  Notify: Charge Nurse/RN  Name of Charge Nurse/RN Notified Janett Billow  Date Charge Nurse/RN Notified 08/29/20  Time Charge Nurse/RN Notified 1128  Notify: Provider  Provider Name/Title Manuella Ghazi  Date Provider Notified 08/29/20  Time Provider Notified 1128  Notification Type Face-to-face  Notification Reason Change in status  Provider response See new orders  Date of Provider Response 08/29/20  Time of Provider Response 1128  Notify: Rapid Response  Name of Rapid Response RN Notified Serinity RN  Date Rapid Response Notified 08/29/20  Time Rapid Response Notified 1128  Document  Patient Outcome Transferred/level of care increased  Inserted for Cheral Marker RN

## 2020-08-30 NOTE — Consult Note (Signed)
[facility] Consult   Reason for Consult: Follow-up patient with delirium Referring Physician:  Manuella Ghazi Patient Identification: Stacy Pearson MRN:  [id number] Principal Diagnosis: Delirium Diagnosis:  Principal Problem:   Delirium Active Problems:   Bipolar 1 disorder ([place])   COPD (chronic obstructive pulmonary disease) (HCC)   Essential tremor   Migraine without aura   Parkinson's disease ([place])   Epilepsy ([place])   Depression with anxiety   HTN (hypertension)   Acute metabolic encephalopathy   UTI (urinary tract infection)   AKI (acute kidney injury) ([place])   Severe sepsis with septic shock ([place])   Total Time spent with patient: 20 minutes  Subjective:   Stacy Pearson is a 60 y.o. female patient admitted with patient not verbal.  HPI: Came by to see patient again this evening in the intensive care unit.  She is now on high flow facemask oxygen to maintain saturations.  Chart reviewed.  The conclusion seems to be that it is unlikely that she has a CNS infection.  EEG was done and showed a pattern just consistent with diffuse encephalopathy no sign of seizures.  This evening when I spoke to the patient she opened her eyes.  I asked her to raise one of her hands if she could hear me and she did not do that but when I ask her to close her eyes if she could hear me she did do that.  This is more than I have gotten from her before.  She is not showing some of the decorticate posturing that she was doing earlier in her hospitalization.  Past Psychiatric History: Past history of longstanding anxiety depression bipolar disorder  Risk to Self:   Risk to Others:   Prior Inpatient Therapy:   Prior Outpatient Therapy:    Past Medical History:  Past Medical History:  Diagnosis Date  . Abdominal pain   . Allergic genetic state   . Anxiety   . Asthma   . Basal cell carcinoma   . Bipolar disorder ([place])   . Cancer ([place])    MOS surgery basal cell face  . COPD (chronic  obstructive pulmonary disease) ([place])   . Depression   . Eating disorder   . Eczema   . Encounter for blood transfusion   . Essential tremor   . 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 GERD (gastroesophageal reflux disease)   . Headache   . History of basal cell carcinoma   . Hypertension   . Neuropathy   . Parkinson's disease ([place])   . Parkinson's disease ([place])   . Parkinsonism [facility])     Past Surgical History:  Procedure Laterality Date  . ABDOMINAL HYSTERECTOMY    . ADENOIDECTOMY    . BLADDER SURGERY    . bone grafts    . CATARACT EXTRACTION W/ INTRAOCULAR LENS IMPLANT    . CERVICAL FUSION    . COLONOSCOPY    . COLONOSCOPY WITH ESOPHAGOGASTRODUODENOSCOPY (EGD)    . COLONOSCOPY WITH PROPOFOL N/A 04/28/2018   Procedure: COLONOSCOPY WITH PROPOFOL;  Surgeon: Manya Silvas, MD;  Location: [facility] ENDOSCOPY;  Service: Endoscopy;  Laterality: N/A;  . EYE SURGERY    . GRAFT APPLICATION     in neck  . LAPAROSCOPIC BILATERAL SALPINGO OOPHERECTOMY    . NEUROPLASTY / TRANSPOSITION ULNAR NERVE AT ELBOW    . OOPHORECTOMY    . SKIN CANCER EXCISION    . skin grafts   left forearm; hand bil.lower extremeties  .  titanium plate    . TONSILLECTOMY    . TUBAL LIGATION     Family History:  Family History  Problem Relation Age of Onset  . Breast cancer Mother   . Lung cancer Father    Family Psychiatric  History: See previous Social History:  Social History   Substance and Sexual Activity  Alcohol Use Never     Social History   Substance and Sexual Activity  Drug Use Never    Social History   Socioeconomic History  . Marital status: Single    Spouse name: Not on file  . Number of children: Not on file  . Years of education: Not on file  . Highest education level: Not on file  Occupational History  . Not on file  Tobacco Use  . Smoking status: Former Smoker    Types: Cigarettes    Quit date: 07/14/2006    Years since quitting: 14.1  .  Smokeless tobacco: Never Used  Substance and Sexual Activity  . Alcohol use: Never  . Drug use: Never  . Sexual activity: Not on file  Other Topics Concern  . Not on file  Social History Narrative  . Not on file   Social Determinants of Health   Financial Resource Strain: Not on file  Food Insecurity: Not on file  Transportation Needs: Not on file  Physical Activity: Not on file  Stress: Not on file  Social Connections: Not on file   Additional Social History:    Allergies:   Allergies  Allergen Reactions  . Codeine Nausea And Vomiting  . Prednisone     Labs:  Results for orders placed or performed during the hospital encounter of 08/26/20 (from the past 48 hour(s))  Renal function panel     Status: Abnormal   Collection Time: 08/29/20 10:18 AM  Result Value Ref Range   Sodium 143 135 - 145 mmol/L   Potassium 4.2 3.5 - 5.1 mmol/L   Chloride 104 98 - 111 mmol/L   CO2 20 (L) 22 - 32 mmol/L   Glucose, Bld 94 70 - 99 mg/dL    Comment: Glucose reference range applies only to samples taken after fasting for at least 8 hours.   BUN 96 (H) 6 - 20 mg/dL   Creatinine, Ser 8.43 (H) 0.44 - 1.00 mg/dL   Calcium 8.7 (L) 8.9 - 10.3 mg/dL   Phosphorus 7.3 (H) 2.5 - 4.6 mg/dL   Albumin 2.9 (L) 3.5 - 5.0 g/dL   GFR, Estimated 5 (L) >60 mL/min    Comment: (NOTE) Calculated using the CKD-EPI Creatinine Equation (2021)    Anion gap 19 (H) 5 - 15    Comment: Performed at [facility], [address] [place], [place] [address]  CBC     Status: Abnormal   Collection Time: 08/29/20 10:18 AM  Result Value Ref Range   WBC 7.7 4.0 - 10.5 K/uL   RBC 3.28 (L) 3.87 - 5.11 MIL/uL   Hemoglobin 10.0 (L) 12.0 - 15.0 g/dL   HCT 29.9 (L) 36.0 - 46.0 %   MCV 91.2 80.0 - 100.0 fL   MCH 30.5 26.0 - 34.0 pg   MCHC 33.4 30.0 - 36.0 g/dL   RDW 14.2 11.5 - 15.5 %   Platelets 285 150 - 400 K/uL   nRBC 0.0 0.0 - 0.2 %    Comment: Performed at [facility], [address],  [place], [place] [address]  CK     Status: None  Collection Time: 08/29/20 10:18 AM  Result Value Ref Range   Total CK 183 38 - 234 U/L    Comment: Performed at [facility], [address] [place], [place] [address]  Blood gas, arterial     Status: Abnormal   Collection Time: 08/29/20 11:32 AM  Result Value Ref Range   Delivery systems ROOM AIR    pH, Arterial 7.37 7.350 - 7.450   pCO2 arterial 38 32.0 - 48.0 mmHg   pO2, Arterial 63 (L) 83.0 - 108.0 mmHg   Bicarbonate 22.0 20.0 - 28.0 mmol/L   Acid-base deficit 3.0 (H) 0.0 - 2.0 mmol/L   O2 Saturation 91.0 %   Patient temperature 37.0    Collection site RIGHT RADIAL    Sample type ARTERIAL DRAW    Allens test (pass/fail) PASS PASS    Comment: Performed at [facility], [address] [place], [place] [address]  Glucose, capillary     Status: None   Collection Time: 08/29/20 11:52 AM  Result Value Ref Range   Glucose-Capillary 83 70 - 99 mg/dL    Comment: Glucose reference range applies only to samples taken after fasting for at least 8 hours.  Hepatitis B surface antibody,qualitative     Status: None   Collection Time: 08/29/20  3:16 PM  Result Value Ref Range   Hep B S Ab NON REACTIVE NON REACTIVE    Comment: (NOTE) Inconsistent with immunity, less than 10 mIU/mL.  Performed at [facility], [place] [address], [place], [place] [address]   Hepatitis B core antibody, IgM     Status: None   Collection Time: 08/29/20  3:16 PM  Result Value Ref Range   Hep B C IgM NON REACTIVE NON REACTIVE    Comment: Performed at [facility] [address], [place], [place] [address]  Hepatitis B surface antigen     Status: None   Collection Time: 08/29/20  3:16 PM  Result Value Ref Range   Hepatitis B Surface Ag NON REACTIVE NON REACTIVE    Comment: Performed at [facility] [address], [place], [place] [address]  Hepatitis C antibody     Status: None   Collection Time: 08/29/20  3:16 PM  Result  Value Ref Range   HCV Ab NON REACTIVE NON REACTIVE    Comment: (NOTE) Nonreactive HCV antibody screen is consistent with no HCV infections,  unless recent infection is suspected or other evidence exists to indicate HCV infection.  Performed at [facility], [place] [address] [place], [place] [address]   Renal function panel     Status: Abnormal   Collection Time: 08/30/20 10:00 AM  Result Value Ref Range   Sodium 143 135 - 145 mmol/L   Potassium 4.1 3.5 - 5.1 mmol/L   Chloride 101 98 - 111 mmol/L   CO2 22 22 - 32 mmol/L   Glucose, Bld 80 70 - 99 mg/dL    Comment: Glucose reference range applies only to samples taken after fasting for at least 8 hours.   BUN 77 (H) 6 - 20 mg/dL   Creatinine, Ser 7.49 (H) 0.44 - 1.00 mg/dL   Calcium 8.8 (L) 8.9 - 10.3 mg/dL   Phosphorus 7.7 (H) 2.5 - 4.6 mg/dL   Albumin 2.7 (L) 3.5 - 5.0 g/dL   GFR, Estimated 6 (L) >60 mL/min    Comment: (NOTE) Calculated using the CKD-EPI Creatinine Equation (2021)    Anion gap 20 (  H) 5 - 15    Comment: Performed at [facility], [address] [place], [place] [address]  CBC     Status: Abnormal   Collection Time: 08/30/20 10:00 AM  Result Value Ref Range   WBC 6.3 4.0 - 10.5 K/uL   RBC 3.14 (L) 3.87 - 5.11 MIL/uL   Hemoglobin 9.7 (L) 12.0 - 15.0 g/dL   HCT 28.9 (L) 36.0 - 46.0 %   MCV 92.0 80.0 - 100.0 fL   MCH 30.9 26.0 - 34.0 pg   MCHC 33.6 30.0 - 36.0 g/dL   RDW 14.1 11.5 - 15.5 %   Platelets 249 150 - 400 K/uL   nRBC 0.0 0.0 - 0.2 %    Comment: Performed at [facility], [address] [place], [place] [address]  Hepatic function panel     Status: Abnormal   Collection Time: 08/30/20 10:00 AM  Result Value Ref Range   Total Protein 6.5 6.5 - 8.1 g/dL   Albumin 2.7 (L) 3.5 - 5.0 g/dL   AST 20 15 - 41 U/L   ALT <5 0 - 44 U/L    Comment: REPEATED TO VERIFY   Alkaline Phosphatase 75 38 - 126 U/L   Total Bilirubin 0.6 0.3 - 1.2 mg/dL   Bilirubin, Direct <0.1 0.0 - 0.2  mg/dL   Indirect Bilirubin NOT CALCULATED 0.3 - 0.9 mg/dL    Comment: Performed at [facility], [address] [place], [place] [address]  Glucose, capillary     Status: None   Collection Time: 08/30/20 11:27 AM  Result Value Ref Range   Glucose-Capillary 95 70 - 99 mg/dL    Comment: Glucose reference range applies only to samples taken after fasting for at least 8 hours.   Comment 1 Repeat Test     Current Facility-Administered Medications  Medication Dose Route Frequency Provider Last Rate Last Admin  . acetaminophen (TYLENOL) tablet 650 mg  650 mg Oral Q6H PRN Ivor Costa, MD       Or  . acetaminophen (TYLENOL) suppository 650 mg  650 mg Rectal Q6H PRN Ivor Costa, MD      . albuterol (PROVENTIL) (2.5 MG/3ML) 0.083% nebulizer solution 2.5 mg  2.5 mg Nebulization Q4H PRN Ivor Costa, MD      . carbidopa-levodopa (SINEMET IR) 25-100 MG per tablet immediate release 1 tablet  1 tablet Oral Daily Lorella Nimrod, MD      . carbidopa-levodopa (SINEMET IR) 25-100 MG per tablet immediate release 2 tablet  2 tablet Oral BID Lorella Nimrod, MD   2 tablet at 08/27/20 1417  . Carbidopa-Levodopa ER (SINEMET CR) 25-100 MG tablet controlled release 1 tablet  1 tablet Oral TID Oswald Hillock, RPH   1 tablet at 08/27/20 1229  . Carbidopa-Levodopa ER (SINEMET CR) 25-100 MG tablet controlled release 2 tablet  2 tablet Oral QHS Oswald Hillock, RPH   2 tablet at 08/27/20 2134  . cefTRIAXone (ROCEPHIN) 2 g in sodium chloride 0.9 % 100 mL IVPB  2 g Intravenous Q12H Ravishankar, Joellyn Quails, MD 200 mL/hr at 08/30/20 1333 2 g at 08/30/20 1333  . Chlorhexidine Gluconate Cloth 2 % PADS 6 each  6 each Topical Q0600 Lorella Nimrod, MD   6 each at 08/30/20 0612  . Chlorhexidine Gluconate Cloth 2 % PADS 6 each  6 each Topical Q0600 Lavonia Dana, MD   6 each at 08/30/20 0612  . vitamin B-12 (CYANOCOBALAMIN) tablet 1,000 mcg  1,000 mcg Oral Daily  Donnetta Simpers, MD       Or  . cyanocobalamin ((VITAMIN B-12))  injection 1,000 mcg  1,000 mcg Intramuscular Daily Donnetta Simpers, MD   1,000 mcg at 08/30/20 1328  . heparin injection 5,000 Units  5,000 Units Subcutaneous Q8H Oswald Hillock, RPH   5,000 Units at 08/30/20 1327  . Ipratropium-Albuterol (COMBIVENT) respimat 1 puff  1 puff Inhalation Q6H Ivor Costa, MD      . lactated ringers infusion   Intravenous Continuous Lorella Nimrod, MD   Stopped at 08/29/20 1147  . lamoTRIgine (LAMICTAL) tablet 25 mg  25 mg Oral BID Ivor Costa, MD   25 mg at 08/28/20 0900  . LORazepam (ATIVAN) injection 2 mg  2 mg Intravenous Q2H PRN Ivor Costa, MD      . ondansetron [facility]) injection 4 mg  4 mg Intravenous Q8H PRN Ivor Costa, MD      . propranolol ER (INDERAL LA) 24 hr capsule 60 mg  60 mg Oral Daily Ivor Costa, MD   60 mg at 08/28/20 0900  . thiamine (B-1) 250 mg in sodium chloride 0.9 % 50 mL IVPB  250 mg Intravenous Daily Lorella Nimrod, MD 100 mL/hr at 08/30/20 1514 250 mg at 08/30/20 1514   Followed by  . [START ON 09/04/2020] thiamine (B-1) injection 100 mg  100 mg Intravenous Daily Lorella Nimrod, MD        Musculoskeletal: Strength & Muscle Tone: decreased Gait & Station: unable to stand Patient leans: N/A  Psychiatric Specialty Exam: Physical Exam Vitals and nursing note reviewed.  Constitutional:      Appearance: She is well-developed and well-nourished.  HENT:     Head: Normocephalic and atraumatic.  Eyes:     Conjunctiva/sclera: Conjunctivae normal.     Pupils: Pupils are equal, round, and reactive to light.  Cardiovascular:     Heart sounds: Normal heart sounds.  Pulmonary:     Effort: Pulmonary effort is normal.  Abdominal:     Palpations: Abdomen is soft.  Musculoskeletal:        General: Normal range of motion.     Cervical back: Normal range of motion.  Skin:    General: Skin is warm and dry.  Neurological:     Mental Status: She is alert.  Psychiatric:        Attention and Perception: She is inattentive.        Mood and Affect:  Affect is blunt.        Speech: She is noncommunicative.     Review of Systems  Blood pressure (!) 166/93, pulse 91, temperature 98.7 F (37.1 C), temperature source Axillary, resp. rate (!) 21, height 5\' 7"  (1.702 m), weight 67.1 kg, SpO2 98 %.Body mass index is 23.17 kg/m.  General Appearance: Casual  Eye Contact:  Minimal  Speech:  Negative  Volume:  Decreased  Mood:  Negative  Affect:  Negative  Thought Process:  NA  Orientation:  Negative  Thought Content:  Negative  Suicidal Thoughts:  No  Homicidal Thoughts:  No  Memory:  Negative  Judgement:  Negative  Insight:  Negative  Psychomotor Activity:  Negative  Concentration:  Concentration: Negative  Recall:  Negative  Fund of Knowledge:  Negative  Language:  Negative  Akathisia:  Negative  Handed:  Right  AIMS (if indicated):     Assets:  Housing Social Support  ADL's:  Impaired  Cognition:  Impaired,  Moderate and Severe  Sleep:  Treatment Plan Summary: Plan Obviously it is concerning that her oxygen needs are worse but on the other hand I think there are some positive signs.  She seemed more alert to me today.  In any case no change to any intervention at this point.  Hopefully she is on the path to recovering if this is all the result of an overdose and only time will tell what her ultimate baseline will be.  Offered support to the patient and some explanation of what was going on.  Disposition: Patient does not meet criteria for psychiatric inpatient admission. Supportive therapy provided about ongoing stressors.  Alethia Berthold, MD 08/30/2020 5:59 PM

## 2020-08-30 NOTE — Progress Notes (Signed)
PROGRESS NOTE    Stacy Pearson  CHY:[id number] DOB: [birth year] DOA: 08/26/2020 PCP: Valera Castle, MD   Brief Narrative: Taken from H&P. Stacy Pearson is a 60 y.o. female with medical history significant of bipolar, depression, anxiety, Parkinson, epilepsy, HTN, COPD, presents with AMS. On presentation patient was confused and agitated.  Admitting provider was able to contact her mother who is the only contact listed, and according to her she lives alone and was at her normal state of health until Friday afternoon. She was sleeping most of the time since then and found to be more confused and somnolent and EMS was called. Mother was not aware of any recent illnesses. On presentation she was found to have neutrophilic predominant leukocytosis, lactic acidosis at 6.7, Covid PCR negative, AKI, tachycardic and tachypneic.  Chest x-ray with low lung volumes and no infiltrate.  CT head was negative.  Lactic acidosis started improving, worsening renal function and hyperkalemia next morning.  Procalcitonin elevated at 1.77.  Blood and urine cultures negative.  Remained very altered, unable to provide any history. Worsening renal function, nephrology started emergent HD on 2/16 after transfer to ICU/SD Neurology was consulted with persistent altered mental status, LP was done and CSF not c/w infectious etio, on empiric rocephin   Subjective: remains altered, and lethargic although opens eye today. Not quite following commands Assessment & Plan:   Principal Problem:   Delirium Active Problems:   Bipolar 1 disorder (HCC)   COPD (chronic obstructive pulmonary disease) (HCC)   Essential tremor   Migraine without aura   Parkinson's disease ([place])   Epilepsy ([place])   Depression with anxiety   HTN (hypertension)   Acute metabolic encephalopathy   UTI (urinary tract infection)   AKI (acute kidney injury) ([place])   Severe sepsis with septic shock (HCC)  Acute Metabolic encephalopathy   Initially met severe sepsis criteria with leukocytosis, tachycardic, tachypneic, lactic acidosis, cultures remain negative D.D. - Uremia/Azotemia vs Drug OD (?Cariprazine) vs Infectious etio like Meningitis/encephalitis Mental status somewhat improved s/p HD  Initial CT head negative.  Procalcitonin at 1.77.  Worsening renal function/azotemia likely contributing to her MS -Neurology following, s/p LP with CSF not consistent with infection - per ID less likely meningitis/encephalitis -UDS positive for benzo & tricyclics - CK -878 -Salicylates and acetaminophen levels-within normal limit. - on IV rocephin Empirically for now. received acyclovir (s/p 1 dose) on 2/15 -Keep holding sedative medications. - EEG on 2/17 c/w moderate diffuse encephalopathy.  No seizure/epileptiform discharges  AKI -now requiring dialysis, second treatment today Further HD per nephrology CT stone studies without any significant abnormalities except a hypodensity in the bladder wall which can represent some bleed without any wall thickness. There was some concern of urinary retention so Foley was placed on 2/15 due to worsening renal function for strict intake and output record.  Acute hypoxic respiratory failure Requiring BiPAP at this time.  PCCM following [facility] as able  Hyperkalemia.  Resolved  Most likely secondary to worsening renal function.  Concern of renal tubular acidosis.  Anion gap metabolic acidosis.  Most likely secondary to lactic acidosis. Elevated CK so rhabdomyolysis can be playing a role. -should correct with HD  Bipolar 1 disorder with depression and anxiety.  Psych is recommending continuation of benzos with prn Ativan as she was taking very high dose at baseline to prevent any withdrawal. -Continue with Ativan 2 mg IV Q 2 hrs prn -Keep holding rest of her sedating medications.  COPD.  Appears  stable, no wheezing and chest x-ray without any acute abnormality. -As needed  bronchodilators  History of Parkinson's disease.  Unknown baseline -Continue with home dose of Sinemet. -Continue with propranolol-do not know whether tremors are essential or secondary to underlying Parkinson's.  History of migraine. -Continue as needed Tylenol  History of seizure disorder ??  No witnessed seizure-like activity. -Continue home dose of Lamictal-not sure whether she was taking it for seizures. -Continue with Ativan as needed. -EEG shows triphasic waves c/s medication related and no epileptic waves, repeat EEG today per neuro  Hypertension.  Blood pressure within goal. On home propranolol. - as needed hydralazine.   Objective: Vitals:   08/30/20 1253 08/30/20 1300 08/30/20 1500 08/30/20 1515  BP: (!) 164/88 (!) 164/88  (!) 166/93  Pulse: 94 94 93 91  Resp: (!) 25 (!) 26 (!) 22 (!) 21  Temp: 98.7 F (37.1 C)     TempSrc: Axillary     SpO2: 98% 97% 99% 98%  Weight:      Height:        Intake/Output Summary (Last 24 hours) at 08/30/2020 1631 Last data filed at 08/30/2020 1253 Gross per 24 hour  Intake 499.84 ml  Output 185 ml  Net 314.84 ml   Filed Weights   08/27/20 0100 08/28/20 0245 08/29/20 0337  Weight: 64.1 kg 66.8 kg 67.1 kg    Examination:  General.  Chronically ill-appearing lady, in no acute distress, not following any command Pulmonary.  Lungs clear bilaterally, normal respiratory effort. Tachypneic CV.  Regular rate and rhythm, no JVD, rub or murmur. Abdomen.  Soft, nontender, nondistended, BS positive. CNS.  not following any commands.  But opening her eyes and moving her extremities voluntarily Extremities.  No edema, no cyanosis, pulses intact and symmetrical. Psychiatry.  Judgment and insight appears impaired  DVT prophylaxis: Lovenox Code Status: DNR-admitting provider confirmed with mother Family Communication: None, tried calling her mother at [phone] but goes straight to her voice mail Disposition Plan: unknown Status is:  Inpatient    Remains inpatient appropriate because:Inpatient level of care appropriate due to severity of illness   Dispo: The patient is from: Home              Anticipated d/c is to: To be determined              Anticipated d/c date is: > 3 days              Patient currently is not medically stable to d/c.  Requiring dialysis and has multiorgan failure requiring BiPAP   Difficult to place patient Yes              Level of care: Stepdown  Consultants:   Psychiatry  Nephrology  Neurology  ID  Procedures:  Antimicrobials:  Vancomycin Cefepime   acyclovir  Data Reviewed: I have personally reviewed following labs and imaging studies  CBC: Recent Labs  Lab 08/26/20 0508 08/27/20 0643 08/28/20 0902 08/29/20 1018 08/30/20 1000  WBC 18.3* 15.3* 7.8 7.7 6.3  NEUTROABS 16.7*  --   --   --   --   HGB 14.9 11.8* 9.8* 10.0* 9.7*  HCT 46.0 35.2* 29.6* 29.9* 28.9*  MCV 94.7 92.6 91.4 91.2 92.0  PLT 475* 281 268 285 003   Basic Metabolic Panel: Recent Labs  Lab 08/26/20 0508 08/27/20 0643 08/28/20 0902 08/29/20 1018 08/30/20 1000  NA 146* 145 144 143 143  K 4.8 5.4* 3.9 4.2 4.1  CL 112* 110  105 104 101  CO2 12* 17* 23 20* 22  GLUCOSE 148* 75 120* 94 80  BUN 43* 63* 87* 96* 77*  CREATININE 3.53* 5.55* 6.92* 8.43* 7.49*  CALCIUM 9.5 9.2 8.5* 8.7* 8.8*  PHOS  --   --  5.8* 7.3* 7.7*   GFR: Estimated Creatinine Clearance: 7.9 mL/min (A) (by C-G formula based on SCr of 7.49 mg/dL (H)). Liver Function Tests: Recent Labs  Lab 08/26/20 0508 08/28/20 0902 08/29/20 1018 08/30/20 1000  AST 46*  --   --   --   ALT 20  --   --   --   ALKPHOS 112  --   --   --   BILITOT 0.5  --   --   --   PROT 9.0*  --   --   --   ALBUMIN 4.5 2.9* 2.9* 2.7*   No results for input(s): LIPASE, AMYLASE in the last 168 hours. Recent Labs  Lab 08/27/20 1557  AMMONIA 28   Coagulation Profile: Recent Labs  Lab 08/28/20 0902  INR 1.1   Cardiac Enzymes: Recent Labs  Lab  08/27/20 0911 08/29/20 1018  CKTOTAL 364* 183   BNP (last 3 results) No results for input(s): PROBNP in the last 8760 hours. HbA1C: No results for input(s): HGBA1C in the last 72 hours. CBG: Recent Labs  Lab 08/28/20 1449 08/29/20 1152 08/30/20 1127  GLUCAP 96 83 95   Lipid Profile: No results for input(s): CHOL, HDL, LDLCALC, TRIG, CHOLHDL, LDLDIRECT in the last 72 hours. Thyroid Function Tests: No results for input(s): TSH, T4TOTAL, FREET4, T3FREE, THYROIDAB in the last 72 hours. Anemia Panel: No results for input(s): VITAMINB12, FOLATE, FERRITIN, TIBC, IRON, RETICCTPCT in the last 72 hours. Sepsis Labs: Recent Labs  Lab 08/26/20 0508 08/26/20 0640 08/26/20 1030 08/26/20 1451 08/27/20 1235 08/27/20 1510  PROCALCITON 1.77  --   --   --   --   --   LATICACIDVEN  --    < > 2.3* 2.8* 3.0* 2.1*   < > = values in this interval not displayed.    Recent Results (from the past 240 hour(s))  Resp Panel by RT-PCR (Flu A&B, Covid) Urine, Clean Catch     Status: None   Collection Time: 08/26/20  5:08 AM   Specimen: Urine, Clean Catch; Nasopharyngeal(NP) swabs in vial transport medium  Result Value Ref Range Status   SARS Coronavirus 2 by RT PCR NEGATIVE NEGATIVE Final    Comment: (NOTE) SARS-CoV-2 target nucleic acids are NOT DETECTED.  The SARS-CoV-2 RNA is generally detectable in upper respiratory specimens during the acute phase of infection. The lowest concentration of SARS-CoV-2 viral copies this assay can detect is 138 copies/mL. A negative result does not preclude SARS-Cov-2 infection and should not be used as the sole basis for treatment or other patient management decisions. A negative result may occur with  improper specimen collection/handling, submission of specimen other than nasopharyngeal swab, presence of viral mutation(s) within the areas targeted by this assay, and inadequate number of viral copies(<138 copies/mL). A negative result must be combined  with clinical observations, patient history, and epidemiological information. The expected result is Negative.  Fact Sheet for Patients:  EntrepreneurPulse.com.au  Fact Sheet for Healthcare Providers:  IncredibleEmployment.be  This test is no t yet approved or cleared by the [place] FDA and  has been authorized for detection and/or diagnosis of SARS-CoV-2 by FDA under an Emergency Use Authorization (EUA). This EUA will remain  in effect (meaning  this test can be used) for the duration of the COVID-19 declaration under Section 564(b)(1) of the Act, 21 U.S.C.section 360bbb-3(b)(1), unless the authorization is terminated  or revoked sooner.       Influenza A by PCR NEGATIVE NEGATIVE Final   Influenza B by PCR NEGATIVE NEGATIVE Final    Comment: (NOTE) The Xpert Xpress SARS-CoV-2/FLU/RSV plus assay is intended as an aid in the diagnosis of influenza from Nasopharyngeal swab specimens and should not be used as a sole basis for treatment. Nasal washings and aspirates are unacceptable for Xpert Xpress SARS-CoV-2/FLU/RSV testing.  Fact Sheet for Patients: EntrepreneurPulse.com.au  Fact Sheet for Healthcare Providers: IncredibleEmployment.be  This test is not yet approved or cleared by the [place] FDA and has been authorized for detection and/or diagnosis of SARS-CoV-2 by FDA under an Emergency Use Authorization (EUA). This EUA will remain in effect (meaning this test can be used) for the duration of the COVID-19 declaration under Section 564(b)(1) of the Act, 21 U.S.C. section 360bbb-3(b)(1), unless the authorization is terminated or revoked.  Performed at [facility], [address], [place], [place] [address]   Urine culture     Status: None   Collection Time: 08/26/20  5:54 AM   Specimen: Urine, Clean Catch  Result Value Ref Range Status   Specimen Description   Final    URINE,  CLEAN CATCH Performed at [facility], [address] [place], [place] [address]    Special Requests   Final    NONE Performed at [facility], [address], [place], [place] [address]    Culture   Final    NO GROWTH Performed at [facility], [place] [address] [place], [place] [address]    Report Status 08/27/2020 FINAL  Final  Culture, blood (routine x 2)     Status: None (Preliminary result)   Collection Time: 08/26/20  6:03 AM   Specimen: Left Antecubital; Blood  Result Value Ref Range Status   Specimen Description LEFT ANTECUBITAL  Final   Special Requests   Final    BOTTLES DRAWN AEROBIC AND ANAEROBIC Blood Culture adequate volume   Culture   Final    NO GROWTH 4 DAYS Performed at [facility], [address] [place], [place] [address]    Report Status PENDING  Incomplete  Culture, blood (routine x 2)     Status: None (Preliminary result)   Collection Time: 08/26/20  8:55 AM   Specimen: BLOOD LEFT FOREARM  Result Value Ref Range Status   Specimen Description BLOOD LEFT FOREARM  Final   Special Requests   Final    BOTTLES DRAWN AEROBIC AND ANAEROBIC Blood Culture adequate volume   Culture   Final    NO GROWTH 4 DAYS Performed at [facility], [address], [place], [place] [address]    Report Status PENDING  Incomplete  CSF culture w Gram Stain     Status: None (Preliminary result)   Collection Time: 08/28/20 12:01 PM   Specimen: PATH Cytology CSF; Cerebrospinal Fluid  Result Value Ref Range Status   Specimen Description   Final    CSF Performed at [facility], [address], [place], [place] [address]    Special Requests   Final    NONE Performed at [facility], [place] [address] [place], [place] [address]    Gram Stain   Final    WBC SEEN RED BLOOD CELLS NO ORGANISMS SEEN Performed at  [facility], [address], [place], [place] [address]    Culture   Final    NO GROWTH 2  DAYS Performed at [facility], [place] [address] [place], [place] [address]    Report Status PENDING  Incomplete  Culture, fungus without smear     Status: None (Preliminary result)   Collection Time: 08/28/20 12:01 PM   Specimen: PATH Cytology CSF; Cerebrospinal Fluid  Result Value Ref Range Status   Specimen Description   Final    CSF Performed at [facility], [address] [place], [place] [address]    Special Requests NONE  Final   Culture   Final    NO FUNGUS ISOLATED AFTER 2 DAYS Performed at [facility], [place] [address], [place], [place] [address]    Report Status PENDING  Incomplete     Radiology Studies: EEG  Result Date: 08/30/2020 Lora Havens, MD     08/30/2020  4:01 PM Patient Name: Stacy Pearson MRN: [id number] Epilepsy Attending: Lora Havens Referring Physician/Provider: Dr Malvin Johns Date: 08/30/2020 Duration: 33.37  mins  Patient history: 60yo F with ams. EEG to evaluate for seizure  Level of alertness: Awake  AEDs during EEG study: LTG, LRZ  Technical aspects: This EEG study was done with scalp electrodes positioned according to the 10-20 International system of electrode placement. Electrical activity was acquired at a sampling rate of '500Hz'  and reviewed with a high frequency filter of '70Hz'  and a low frequency filter of '1Hz' . EEG data were recorded continuously and digitally stored.  Description: No posterior dominant rhythm was seen. EEG showed continuous generalized 3 to 6 Hz theta-delta slowing.  Hyperventilation and photic stimulation were not performed.    ABNORMALITY -Continuous slow, generalized  IMPRESSION: This study is suggestive of moderate diffuse encephalopathy, nonspecific etiology. No seizures or epileptiform discharges were seen throughout the recording.  Lora Havens   MR BRAIN WO CONTRAST  Result Date: 08/28/2020 CLINICAL DATA:  Sepsis.  Possible bacterial meningitis. EXAM: MRI HEAD WITHOUT CONTRAST  TECHNIQUE: Multiplanar, multiecho pulse sequences of the brain and surrounding structures were obtained without intravenous contrast. COMPARISON:  None. FINDINGS: Severely motion degraded examination. Brain: No acute infarct, mass effect or extra-axial collection. No acute or chronic hemorrhage. Normal white matter signal, parenchymal volume and CSF spaces. The midline structures are normal. Vascular: Major flow voids are preserved. Skull and upper cervical spine: Normal calvarium and skull base. Visualized upper cervical spine and soft tissues are normal. Sinuses/Orbits:No paranasal sinus fluid levels or advanced mucosal thickening. No mastoid or middle ear effusion. Normal orbits. IMPRESSION: 1. Severely motion degraded examination. 2. Within that limitation, no acute intracranial abnormality. Electronically Signed   By: Ulyses Jarred M.D.   On: 08/28/2020 23:01   DG Chest Port 1 View  Result Date: 08/29/2020 CLINICAL DATA:  Altered mental status with shortness of breath EXAM: PORTABLE CHEST 1 VIEW COMPARISON:  08/26/2020 FINDINGS: Low lung volumes. No consolidation or edema. No pleural effusion or pneumothorax. Cardiomediastinal contours are within normal limits for technique. IMPRESSION: No acute process in the chest. Electronically Signed   By: Macy Mis M.D.   On: 08/29/2020 14:15    Scheduled Meds: . carbidopa-levodopa  1 tablet Oral Daily  . carbidopa-levodopa  2 tablet Oral BID  . Carbidopa-Levodopa ER  1 tablet Oral TID  . Carbidopa-Levodopa ER  2 tablet Oral QHS  . Chlorhexidine Gluconate Cloth  6 each Topical Q0600  . Chlorhexidine Gluconate  Cloth  6 each Topical [id number]  . vitamin B-12  1,000 mcg Oral Daily   Or  . cyanocobalamin  1,000 mcg Intramuscular Daily  . heparin injection (subcutaneous)  5,000 Units Subcutaneous Q8H  . Ipratropium-Albuterol  1 puff Inhalation Q6H  . lamoTRIgine  25 mg Oral BID  . propranolol ER  60 mg Oral Daily  . [START ON 09/04/2020] thiamine injection   100 mg Intravenous Daily   Continuous Infusions: . cefTRIAXone (ROCEPHIN)  IV 2 g (08/30/20 1333)  . lactated ringers Stopped (08/29/20 1147)  . thiamine injection 250 mg (08/30/20 1514)     LOS: 4 days   Time spent : 20 minutes.  Max Sane, MD [facility]  If 7PM-7AM, please contact night-coverage [email or web address]  08/30/2020, 4:31 PM   This record has been created using Systems analyst. Errors have been sought and corrected,but may not always be located. Such creation errors do not reflect on the standard of care.

## 2020-08-30 NOTE — Consult Note (Signed)
NAME:  Stacy Pearson, MRN:  [id number], DOB:  [birth year], LOS: 4 ADMISSION DATE:  08/26/2020, CONSULTATION DATE:  08/29/2020 REFERRING MD:  Dr. Manuella Ghazi, CHIEF COMPLAINT:  Acute Respiratory Distress, AMS   Brief History:  60 y.o. Female, DNR/DNI, admitted with Altered Mental Status and Sepsis in setting of suspected Menigitis, along with severe AKI.  Was started on Meninginitis coverage and underwent LP.  On 08/29/20 she developed worsening somnolence, worsening AKI, and Acute Respiratory Distress requiring transfer to Stepdown for BiPAP and emergent Hemodialysis.  History of Present Illness:  Stacy Pearson is a 60 y.o. female with a past medical history significant for Parkinson's Disease, Bipolar Disorder, COPD, Asthma, GERD, hypertension, basal cell carcinoma, and depression who presented to [facility] on 08/26/2020  with somnolence, confusion, agitation and lethargy.  Found to have sepsis with unclear/unknown source, elevated serum lactate along with significant AKI. Started on Meningitis coverage and LP after about 2 doses of Vanc, Cefepime and Acyclovir with pleocytosis WBC count of 49 and mildly elevated CSF protein. CTH negative and MRI Brain negative.  EEG with no seizures.  Neurology, Infectious Diseases, Psychiatry, and Nephrology have been following.  On 08/29/20 she developed worsening AKI, acute respiratory distress and worsening somnolence.  Labs notable for worsening Creatinine to 8.43.  ABG with hypoxia (pH 7.37 / pO2 63 / pCO2 38 / Bicarb 22). Chest x-ray without acute cardiopulmonary process. She was transferred to [facility] unit and placed on BiPAP.  Temporary Hemodialysis catheter has been placed for emergent Hemodialysis.  PCCM is consulted for further assistance with acute respiratory distress.  08/30/2020- patient is having HD session during my evaluation. She is tolerating it well without incident. She was resting comfortably during examination.   Past Medical History:  Parkinson's  Disease Essential tremor Bipolar Disorder COPD Asthma GERD Anxiety Depression Hypertension Neuropathy Basal Cell Carcinoma Eczema   Significant Hospital Events:  2/13: Admitted to Med-Surg unit 2/16: Transfer to Stepdown due to worsening somnolence and AKI, Acute Respiratory Distress; to receive emergent HD; PCCM consulted  Consults:  Hospitalist (primary service) Neurology Infectious Diseases Psychiatry Nephrology PCCM  Procedures:  2/15: Lumbar Puncture 2/16: Right Femoral Temporary HD catheter placed  Significant Diagnostic Tests:  2/13: CXR>>Low lung volumes with no acute cardiopulmonary disease. 2/13: CT Renal Stone Study>>1. Small amount of layering hyperdensity in the posterior bladder could reflect hemorrhage or tiny calculi. No obstructive uropathy. 2. Gallbladder sludge. 3. Aortic Atherosclerosis 2/13:CT Head w/o Contrast>>No acute intracranial abnormality. 2/15: MR Brain>>1. Severely motion degraded examination. 2. Within that limitation, no acute intracranial abnormality. 2/15: EEG>>This study is suggestive of moderate diffuse encephalopathy, nonspecific etiology. Given the morphology and frequency of periodic discharges with triphasic morphology are most likely due to toxic-metabolic causes. No seizures or definite epileptiform discharges were seen throughout the recording. 2/16: CXR>>No acute process in the chest.  Micro Data:  2/13: SARS-CoV-2 PCR>> negative 2/13: Influenza AMB PCR>> negative 2/13: HIV screen>> negative 2/13: Urine>> no growth 2/13: Blood culture x2>> no growth to date 2/14: RPR>> nonreactive 2/15: CSF culture>>  Antimicrobials:  Acyclovir 2/14>>2/16 Cefepime 2/13>>2/15 Ceftriaxone 2/14>> Vancomycin 2/13>>2/16 Flagyl x1 dose on 2/13    Objective   Blood pressure (!) 164/94, pulse 97, temperature 98.3 F (36.8 C), temperature source Axillary, resp. rate 19, height 5\' 7"  (1.702 m), weight 67.1 kg, SpO2 99 %.         Intake/Output Summary (Last 24 hours) at 08/30/2020 0921 Last data filed at 08/30/2020 0600 Gross per 24 hour  Intake 499.84  ml  Output 235 ml  Net 264.84 ml   Filed Weights   08/27/20 0100 08/28/20 0245 08/29/20 0337  Weight: 64.1 kg 66.8 kg 67.1 kg    Examination: General: NAD age appropriate HENT: Atraumatic, normocephalic, neck supple, no JVD Lungs: decreased air entry with BIPAP sounds  Cardiovascular: Regular rate and rhythm, s1s2, no M/R/G, 2+ distal pulses Abdomen: Soft, nontender, nondistended, no guarding or rebound tenderness Extremities: No deformities, no edema Neuro: Somnolent Skin: Warm and dry.  No obvious rashes, lesions, or ulcerations  Resolved Hospital Problem list   N/A  Assessment & Plan:   Acute Hypoxic Respiratory Failure History of COPD & Asthma without Acute Exacerbation -Supplemental O2 as needed to maintain O2 sats 88 to 94% -Pt Is DNR/DNI (confirmed with mother); will place on BiPAP  -Follow intermittent CXR & ABG as needed -CXR on 2/16 with Low lung volumes. No consolidation or edema. No pleural effusion or pneumothorax. Cardiomediastinal contours are within normal limits for technique. -Prn Bronchodilators     Acute Metabolic Encephalopathy in the setting of suspected Meningitis +/- Uremia +/- concern for Overdose on home Cariprazine -Provide supportive care -Avoid sedating meds as able -Neurology, ID, and Psychiatry following, appreciate input -Continue ABX as per ID -Continue Thiamine and Vit. B12 -CT Head, MR Head negative, EEG without seizures -Urine drug screen positive for Tricyclics -Salicylates and Acetaminophen levels normals   AKI -Monitor I&O's / urinary output -Follow BMP -Ensure adequate renal perfusion -Avoid nephrotoxic agents as able -Replace electrolytes as indicated -Nephrology following, plan for emergent HD   Anemia -Monitor for S/Sx of bleeding -Trend CBC -SQ Heparin for VTE Prophylaxis  -Transfuse  for Hgb <7   Best practice (evaluated daily)  Diet: NPO Pain/Anxiety/Delirium protocol (if indicated): NPO VAP protocol (if indicated): N/A DVT prophylaxis: Heparin SQ GI prophylaxis: N/A Glucose control: N/A Mobility: Bedrest Disposition:Stepdown  Goals of Care:  Last date of multidisciplinary goals of care discussion::N/A Family and staff present: Bedside RN present; attempted to reach pt's mother by telephone, left message for callback Summary of discussion: Plan for BiPAP and emergent HD Follow up goals of care discussion due: 08/30/2020 Code Status: DNR/DNI  Labs   CBC: Recent Labs  Lab 08/26/20 0508 08/27/20 0643 08/28/20 0902 08/29/20 1018  WBC 18.3* 15.3* 7.8 7.7  NEUTROABS 16.7*  --   --   --   HGB 14.9 11.8* 9.8* 10.0*  HCT 46.0 35.2* 29.6* 29.9*  MCV 94.7 92.6 91.4 91.2  PLT 475* 281 268 809    Basic Metabolic Panel: Recent Labs  Lab 08/26/20 0508 08/27/20 0643 08/28/20 0902 08/29/20 1018  NA 146* 145 144 143  K 4.8 5.4* 3.9 4.2  CL 112* 110 105 104  CO2 12* 17* 23 20*  GLUCOSE 148* 75 120* 94  BUN 43* 63* 87* 96*  CREATININE 3.53* 5.55* 6.92* 8.43*  CALCIUM 9.5 9.2 8.5* 8.7*  PHOS  --   --  5.8* 7.3*   GFR: Estimated Creatinine Clearance: 7 mL/min (A) (by C-G formula based on SCr of 8.43 mg/dL (H)). Recent Labs  Lab 08/26/20 0508 08/26/20 0640 08/26/20 1030 08/26/20 1451 08/27/20 0643 08/27/20 1235 08/27/20 1510 08/28/20 0902 08/29/20 1018  PROCALCITON 1.77  --   --   --   --   --   --   --   --   WBC 18.3*  --   --   --  15.3*  --   --  7.8 7.7  LATICACIDVEN  --    < >  2.3* 2.8*  --  3.0* 2.1*  --   --    < > = values in this interval not displayed.    Liver Function Tests: Recent Labs  Lab 08/26/20 0508 08/28/20 0902 08/29/20 1018  AST 46*  --   --   ALT 20  --   --   ALKPHOS 112  --   --   BILITOT 0.5  --   --   PROT 9.0*  --   --   ALBUMIN 4.5 2.9* 2.9*   No results for input(s): LIPASE, AMYLASE in the last 168  hours. Recent Labs  Lab 08/27/20 1557  AMMONIA 28    ABG    Component Value Date/Time   PHART 7.37 08/29/2020 1132   PCO2ART 38 08/29/2020 1132   PO2ART 63 (L) 08/29/2020 1132   HCO3 22.0 08/29/2020 1132   ACIDBASEDEF 3.0 (H) 08/29/2020 1132   O2SAT 91.0 08/29/2020 1132     Coagulation Profile: Recent Labs  Lab 08/28/20 0902  INR 1.1    Cardiac Enzymes: Recent Labs  Lab 08/27/20 0911 08/29/20 1018  CKTOTAL 364* 183    HbA1C: No results found for: HGBA1C  CBG: Recent Labs  Lab 08/28/20 1449 08/29/20 1152  GLUCAP 96 83    Review of Systems:   Unable to assess due to Altered Mental Status and critical illness  Past Medical History:  She,  has a past medical history of Abdominal pain, Allergic genetic state, Anxiety, Asthma, Basal cell carcinoma, Bipolar disorder ([place]), Cancer ([place]), COPD (chronic obstructive pulmonary disease) ([place]), Depression, Eating disorder, Eczema, Encounter for blood transfusion, Essential tremor, 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   Surgical History:   Past Surgical History:  Procedure Laterality Date  . ABDOMINAL HYSTERECTOMY    . ADENOIDECTOMY    . BLADDER SURGERY    . bone grafts    . CATARACT EXTRACTION W/ INTRAOCULAR LENS IMPLANT    . CERVICAL FUSION    . COLONOSCOPY    . COLONOSCOPY WITH ESOPHAGOGASTRODUODENOSCOPY (EGD)    . COLONOSCOPY WITH PROPOFOL N/A 04/28/2018   Procedure: COLONOSCOPY WITH PROPOFOL;  Surgeon: Manya Silvas, MD;  Location: [facility] ENDOSCOPY;  Service: Endoscopy;  Laterality: N/A;  . EYE SURGERY    . GRAFT APPLICATION     in neck  . LAPAROSCOPIC BILATERAL SALPINGO OOPHERECTOMY    . NEUROPLASTY / TRANSPOSITION ULNAR NERVE AT ELBOW    . OOPHORECTOMY    . SKIN CANCER EXCISION    . skin  grafts   left forearm; hand bil.lower extremeties  . titanium plate    . TONSILLECTOMY    . TUBAL LIGATION       Social History:   reports that she quit smoking about 14 years ago. Her smoking use included cigarettes. She has never used smokeless tobacco. She reports that she does not drink alcohol and does not use drugs.   Family History:  Her family history includes Breast cancer in her mother; Lung cancer in her father.   Allergies Allergies  Allergen Reactions  . Codeine Nausea And Vomiting  . Prednisone      Home Medications  Prior to Admission medications   Medication Sig Start Date End Date Taking? Authorizing Provider  Carbidopa-Levodopa ER (SINEMET CR) 25-100 MG tablet controlled release Take 1-2 tablets by mouth 4 (four) times daily. Take one tablet by mouth  at 7 am, one tablet at 12 pm, one tablet between 5-6 pm and two tablets at bedtime   Yes [provider]  amitriptyline (ELAVIL) 50 MG tablet Take 1-2 tablets by mouth at bedtime. 03/10/20   [provider]  carbidopa-levodopa (SINEMET IR) 25-100 MG tablet Take 1-2 tablets by mouth 3 (three) times daily. Take two tablets by mouth at 7 am, two tablets at 12 pm and one tablet at 5 pm    [provider]  Cariprazine HCl 6 MG CAPS Take 6 mg by mouth daily at 12 noon. 03/30/20   [provider]  diazepam (VALIUM) 10 MG tablet Take 10 mg by mouth 3 (three) times daily as needed. 08/22/20   [provider]  DULoxetine (CYMBALTA) 60 MG capsule Take by mouth. 05/03/19   [provider]  EPINEPHrine 0.3 mg/0.3 mL IJ SOAJ injection Inject into the muscle once.    [provider]  Ipratropium-Albuterol (COMBIVENT) 20-100 MCG/ACT AERS respimat Inhale 1 puff into the lungs every 6 (six) hours.    [provider]  lamoTRIgine (LAMICTAL) 25 MG tablet Take 50 mg by mouth at bedtime. 08/14/20   [provider]  ondansetron (ZOFRAN) 8 MG tablet Take by mouth every 8  (eight) hours as needed for nausea or vomiting.    [provider]  propranolol ER (INDERAL LA) 60 MG 24 hr capsule Take 60 mg by mouth daily. 08/14/20   [provider]     Critical care time: 33 minutes     Critical care provider statement:    Critical care time (minutes):  33   Critical care time was exclusive of:  Separately billable procedures and  treating other patients   Critical care was necessary to treat or prevent imminent or  life-threatening deterioration of the following conditions:  AMS with confusion, polypharmacy, possilble infection with encepohalolathy, parkinsonism, mood disorder   Critical care was time spent personally by me on the following  activities:  Development of treatment plan with patient or surrogate,  discussions with consultants, evaluation of patient's response to  treatment, examination of patient, obtaining history from patient or  surrogate, ordering and performing treatments and interventions, ordering  and review of laboratory studies and re-evaluation of patient's condition   I assumed direction of critical care for this patient from another  provider in my specialty: no      Ottie Glazier, M.D.  Pulmonary & [place]

## 2020-08-30 NOTE — Progress Notes (Signed)
Neuro: restless to -4 RASS, intermittently opens eyes, moves all extremities, turns self in bed/does not tolerate RN turning Resp: stable on bipap CV: afebrile, warm and well perfused, elevated blood pressure throughout the night, increased edema, HD planned for day shift GIGU: NPO, smear BM, foley in place Skin: clean, dry, and intact Social: No contact with family.  Events: no significant events or changes.   *Mother would like to ensure that the providers are aware when the patient passes she wishes for her body to be donated to [facility] for research, Mother has paperwork.*

## 2020-08-30 NOTE — Procedures (Signed)
Patient Name: Stacy Pearson  MRN: [id number]  Epilepsy Attending: Lora Havens  Referring Physician/Provider: Dr Malvin Johns Date: 08/30/2020 Duration: 33.37  mins  Patient history: 60yo F with ams. EEG to evaluate for seizure  Level of alertness: Awake  AEDs during EEG study: LTG, LRZ  Technical aspects: This EEG study was done with scalp electrodes positioned according to the 10-20 International system of electrode placement. Electrical activity was acquired at a sampling rate of 500Hz  and reviewed with a high frequency filter of 70Hz  and a low frequency filter of 1Hz . EEG data were recorded continuously and digitally stored.   Description: No posterior dominant rhythm was seen. EEG showed continuous generalized 3 to 6 Hz theta-delta slowing.  Hyperventilation and photic stimulation were not performed.     ABNORMALITY -Continuous slow, generalized  IMPRESSION: This study is suggestive of moderate diffuse encephalopathy, nonspecific etiology. No seizures or epileptiform discharges were seen throughout the recording.  Stacy Pearson

## 2020-08-31 DIAGNOSIS — G250 Essential tremor: Secondary | ICD-10-CM | POA: Diagnosis not present

## 2020-08-31 DIAGNOSIS — R410 Disorientation, unspecified: Secondary | ICD-10-CM | POA: Diagnosis not present

## 2020-08-31 DIAGNOSIS — J9601 Acute respiratory failure with hypoxia: Secondary | ICD-10-CM | POA: Diagnosis not present

## 2020-08-31 DIAGNOSIS — D729 Disorder of white blood cells, unspecified: Secondary | ICD-10-CM | POA: Diagnosis not present

## 2020-08-31 DIAGNOSIS — G9340 Encephalopathy, unspecified: Secondary | ICD-10-CM | POA: Diagnosis not present

## 2020-08-31 DIAGNOSIS — N179 Acute kidney failure, unspecified: Secondary | ICD-10-CM | POA: Diagnosis not present

## 2020-08-31 DIAGNOSIS — G9341 Metabolic encephalopathy: Secondary | ICD-10-CM | POA: Diagnosis not present

## 2020-08-31 LAB — RENAL FUNCTION PANEL
Albumin: 2.7 g/dL — ABNORMAL LOW (ref 3.5–5.0)
Anion gap: 21 — ABNORMAL HIGH (ref 5–15)
BUN: 50 mg/dL — ABNORMAL HIGH (ref 6–20)
CO2: 20 mmol/L — ABNORMAL LOW (ref 22–32)
Calcium: 8.9 mg/dL (ref 8.9–10.3)
Chloride: 100 mmol/L (ref 98–111)
Creatinine, Ser: 6 mg/dL — ABNORMAL HIGH (ref 0.44–1.00)
GFR, Estimated: 8 mL/min — ABNORMAL LOW (ref >60)
Glucose, Bld: 72 mg/dL (ref 70–99)
Phosphorus: 7 mg/dL — ABNORMAL HIGH (ref 2.5–4.6)
Potassium: 4.1 mmol/L (ref 3.5–5.1)
Sodium: 141 mmol/L (ref 135–145)

## 2020-08-31 LAB — CSF CULTURE W GRAM STAIN: Culture: NO GROWTH

## 2020-08-31 LAB — CBC
HCT: 31 % — ABNORMAL LOW (ref 36.0–46.0)
Hemoglobin: 10.1 g/dL — ABNORMAL LOW (ref 12.0–15.0)
MCH: 30.1 pg (ref 26.0–34.0)
MCHC: 32.6 g/dL (ref 30.0–36.0)
MCV: 92.5 fL (ref 80.0–100.0)
Platelets: 241 10*3/uL (ref 150–400)
RBC: 3.35 MIL/uL — ABNORMAL LOW (ref 3.87–5.11)
RDW: 13.8 % (ref 11.5–15.5)
WBC: 7.8 10*3/uL (ref 4.0–10.5)
nRBC: 0 % (ref 0.0–0.2)

## 2020-08-31 LAB — METHYLMALONIC ACID, SERUM: Methylmalonic Acid, Quantitative: 484 nmol/L — ABNORMAL HIGH (ref 0–378)

## 2020-08-31 LAB — CULTURE, BLOOD (ROUTINE X 2)
Culture: NO GROWTH
Culture: NO GROWTH
Special Requests: ADEQUATE
Special Requests: ADEQUATE

## 2020-08-31 MED ORDER — HYDRALAZINE HCL 20 MG/ML IJ SOLN
10.0000 mg | Freq: Four times a day (QID) | INTRAMUSCULAR | Status: DC | PRN
  Administered 2020-09-02 – 2020-09-06 (×2): 10 mg via INTRAVENOUS
  Filled 2020-08-31 (×2): qty 1

## 2020-08-31 MED ORDER — HYDRALAZINE HCL 50 MG PO TABS
25.0000 mg | ORAL_TABLET | Freq: Three times a day (TID) | ORAL | Status: DC
  Administered 2020-09-01: 25 mg via ORAL
  Filled 2020-08-31: qty 1

## 2020-08-31 MED ORDER — CEFTRIAXONE SODIUM 2 G IJ SOLR
2.0000 g | INTRAMUSCULAR | Status: AC
  Administered 2020-08-31: 2 g via INTRAVENOUS
  Filled 2020-08-31: qty 20

## 2020-08-31 NOTE — Progress Notes (Addendum)
ID Pt awake, responding to questions Off BIPAP, on nasal cannula Has a fixed stare Patient Vitals for the past 24 hrs:  BP Temp Temp src Pulse Resp SpO2  08/31/20 1700 (!) 159/90 -- -- 98 (!) 25 94 %  08/31/20 1600 (!) 161/90 -- -- 89 19 100 %  08/31/20 1500 (!) 164/92 -- -- 93 (!) 24 99 %  08/31/20 1400 (!) 159/96 -- -- 96 (!) 23 98 %  08/31/20 1345 (!) 169/96 -- -- 98 (!) 25 --  08/31/20 1330 (!) 162/95 -- -- 95 (!) 22 --  08/31/20 1315 (!) 158/97 -- -- 96 (!) 24 --  08/31/20 1300 (!) 157/94 -- -- 99 (!) 22 --  08/31/20 1245 (!) 159/93 -- -- 97 (!) 24 --  08/31/20 1230 (!) 165/93 -- -- 98 (!) 22 --  08/31/20 1215 (!) 161/95 -- -- 98 (!) 23 --  08/31/20 1200 (!) 168/91 -- -- 100 19 --  08/31/20 1145 (!) 168/91 -- -- (!) 101 (!) 21 --  08/31/20 1130 (!) 164/95 -- -- 100 (!) 27 --  08/31/20 1115 (!) 160/90 -- -- (!) 101 (!) 25 --  08/31/20 1100 (!) 161/92 -- -- 98 (!) 23 --  08/31/20 1000 (!) 160/91 -- -- (!) 101 (!) 26 97 %  08/31/20 0900 (!) 161/91 -- -- (!) 101 20 98 %  08/31/20 0800 (!) 166/88 98.9 F (37.2 C) Axillary (!) 101 (!) 22 97 %  08/31/20 0700 (!) 147/79 -- -- 95 (!) 23 97 %  08/31/20 0248 -- -- -- (!) 101 -- 99 %  08/30/20 2200 (!) 172/97 -- -- 97 13 100 %  08/30/20 2100 (!) 160/92 -- -- 94 19 100 %  08/30/20 2031 -- -- -- 95 -- 98 %  08/30/20 2000 -- 99.9 F (37.7 C) Axillary -- -- --   Chest b/l air entry Hss1s2 abd soft Cns- moves all limbs   Labs CBC Latest Ref Rng & Units 08/31/2020 08/30/2020 08/29/2020  WBC 4.0 - 10.5 K/uL 7.8 6.3 7.7  Hemoglobin 12.0 - 15.0 g/dL 10.1(L) 9.7(L) 10.0(L)  Hematocrit 36.0 - 46.0 % 31.0(L) 28.9(L) 29.9(L)  Platelets 150 - 400 K/uL 241 249 285   CMP Latest Ref Rng & Units 08/31/2020 08/30/2020 08/29/2020  Glucose 70 - 99 mg/dL 72 80 94  BUN 6 - 20 mg/dL 50(H) 77(H) 96(H)  Creatinine 0.44 - 1.00 mg/dL 6.00(H) 7.49(H) 8.43(H)  Sodium 135 - 145 mmol/L 141 143 143  Potassium 3.5 - 5.1 mmol/L 4.1 4.1 4.2  Chloride 98 - 111  mmol/L 100 101 104  CO2 22 - 32 mmol/L 20(L) 22 20(L)  Calcium 8.9 - 10.3 mg/dL 8.9 8.8(L) 8.7(L)  Total Protein 6.5 - 8.1 g/dL - 6.5 -  Total Bilirubin 0.3 - 1.2 mg/dL - 0.6 -  Alkaline Phos 38 - 126 U/L - 75 -  AST 15 - 41 U/L - 20 -  ALT 0 - 44 U/L - <5 -   MICRO 2/13 BC- NG 2/15 CSF NG CSF: WBC 39(68% neutrophils) RBC 10 Glucose 75 Total protein 64 CSF VDRL negative CSF HSV PCR negative CSF LDH less than 16 normal  HIV nonreactive Hepatitis B negative Hep C nonreactive  CXR- no infiltrate  Impression/recommendation Acute encephalopathy/unresponsiveness.  Very likely due to polypharmacy.  She was on antipsychotic medication cariprazine, Valium and Parkinson's medication.  There was a question of overdose.  There has been significant improvement in her mentation since starting dialysis  Seizure/postictal phase  was questioned.  EEG did not show seizure activity but had diffuse changes.    CSF analysis showed minimal pleocytosis, mildly elevated protein.  Infection versus noninfectious causes was questioned.  Herpes encephalitis ruled out with no EEG changes of PLEDs, normal MRI and negative PCR in CSF She was on ceftriaxone for bacterial meningitic dose.  Urine culture/blood culture and CSF culture negative no pneumonia will discontinue ceftriaxone.  Neuroleptic malignant syndrome was questioned But not hyperthermia or severe rhabdomyolysis.  Acute kidney injury.  Unclear etiology.  Receiving dialysis. Anemia Hypoalbuminemia  Bipolar disorder  Discussed the management with the care team. ID will sign off call if needed.

## 2020-08-31 NOTE — Progress Notes (Signed)
PROGRESS NOTE    Stacy Pearson  DSK:[id number] DOB: February 02, 1961 DOA: 08/26/2020 PCP: Valera Castle, MD   Brief Narrative: Taken from H&P. Stacy Pearson is a 60 y.o. female with medical history significant of bipolar, depression, anxiety, Parkinson, epilepsy, HTN, COPD, presents with AMS. On presentation patient was confused and agitated.  Admitting provider was able to contact her mother who is the only contact listed, and according to her she lives alone and was at her normal state of health until Friday afternoon. She was sleeping most of the time since then and found to be more confused and somnolent and EMS was called. Mother was not aware of any recent illnesses. On presentation she was found to have neutrophilic predominant leukocytosis, lactic acidosis at 6.7, Covid PCR negative, AKI, tachycardic and tachypneic.  Chest x-ray with low lung volumes and no infiltrate.  CT head was negative.  Lactic acidosis started improving, worsening renal function and hyperkalemia next morning.  Procalcitonin elevated at 1.77.  Blood and urine cultures negative.  Remained very altered, unable to provide any history. Worsening renal function, nephrology started emergent HD on 2/16 after transfer to ICU/SD Neurology was consulted with persistent altered mental status, LP was done and CSF not c/w infectious etio, on empiric rocephin   Subjective: remains altered, and lethargic although opens eye today. Not quite following commands Assessment & Plan:   Principal Problem:   Delirium Active Problems:   Bipolar 1 disorder (HCC)   COPD (chronic obstructive pulmonary disease) (HCC)   Essential tremor   Migraine without aura   Parkinson's disease ([place])   Epilepsy ([place])   Depression with anxiety   HTN (hypertension)   Acute metabolic encephalopathy   UTI (urinary tract infection)   AKI (acute kidney injury) ([place])   Severe sepsis with septic shock (HCC)  Acute Metabolic encephalopathy   Initially met severe sepsis criteria with leukocytosis, tachycardic, tachypneic, lactic acidosis, cultures remain negative Likely from drug overdose and or uremia/azotemia.  Less likely infectious etiology or seizure Mental status somewhat improving s/p HD - 3 sessions Initial CT head negative.  Procalcitonin at 1.77.  Worsening renal function/azotemia likely contributing to her MS -Neurology following, s/p LP with CSF not consistent with infection - per ID less likely meningitis/encephalitis -UDS positive for benzo & tricyclics - on IV rocephin Empirically for now. received acyclovir (s/p 1 dose) on 2/15 -Keep holding sedative medications. - EEG showing moderate diffuse encephalopathy.  No seizure/epileptiform discharges  AKI -now requiring dialysis, so far 3 treatments Further HD per nephrology CT stone studies without any significant abnormalities except a hypodensity in the bladder wall which can represent some bleed without any wall thickness. There was some concern of urinary retention so Foley was placed on 2/15 due to worsening renal function for strict intake and output record.  Acute hypoxic respiratory failure Requiring BiPAP at this time.  PCCM following [facility] as able.  I have requested nursing to reach out to RT for this  Hyperkalemia.  Resolved  Most likely secondary to worsening renal function.  Concern of renal tubular acidosis.  Anion gap metabolic acidosis.  Most likely secondary to lactic acidosis. Elevated CK so rhabdomyolysis can be playing a role. -should correct with HD  Bipolar 1 disorder with depression and anxiety.  Psych is recommending continuation of benzos with prn Ativan as she was taking very high dose at baseline to prevent any withdrawal. -Continue with Ativan 2 mg IV Q 2 hrs prn -Keep holding rest of her  sedating medications.  COPD.  Appears stable, no wheezing and chest x-ray without any acute abnormality. -As needed bronchodilators  History of  Parkinson's disease.  Unknown baseline -Continue with home dose of Sinemet. -Continue with propranolol-do not know whether tremors are essential or secondary to underlying Parkinson's.  History of migraine. -Continue as needed Tylenol  History of seizure disorder ??  No witnessed seizure-like activity. -Continue home dose of Lamictal-not sure whether she was taking it for seizures. -Continue with Ativan as needed. -EEG shows triphasic waves c/s medication related and no epileptic waves, repeat EEG today per neuro  Hypertension.  Blood pressure running high On home propranolol. -Add hydralazine for better blood pressure control   Objective: Vitals:   08/31/20 1330 08/31/20 1345 08/31/20 1400 08/31/20 1500  BP: (!) 162/95 (!) 169/96 (!) 159/96 (!) 164/92  Pulse: 95 98 96 93  Resp: (!) 22 (!) 25 (!) 23 (!) 24  Temp:      TempSrc:      SpO2:   98% 99%  Weight:      Height:        Intake/Output Summary (Last 24 hours) at 08/31/2020 1536 Last data filed at 08/31/2020 1500 Gross per 24 hour  Intake 265.06 ml  Output 1120 ml  Net -854.94 ml   Filed Weights   08/27/20 0100 08/28/20 0245 08/29/20 0337  Weight: 64.1 kg 66.8 kg 67.1 kg    Examination:  General.  Chronically ill-appearing lady, in no acute distress, following some commands  Pulmonary.  Lungs clear bilaterally, normal respiratory effort.  CV.  Regular rate and rhythm, no JVD, rub or murmur. Abdomen.  Soft, nontender, nondistended, BS positive. CNS.  Patient is opening her eyes.  She did follow verbal command for me and squeezed my hand from her left hand.  Moving her extremities voluntarily.  Trying to get out of the bed Extremities.  No edema, no cyanosis, pulses intact and symmetrical. Psychiatry.  Judgment and insight appears impaired  DVT prophylaxis: Lovenox Code Status: DNR-admitting provider confirmed with mother Family Communication: None, tried calling her mother at [phone] but goes straight to her  voice mail Disposition Plan: unknown Status is: Inpatient    Remains inpatient appropriate because:Inpatient level of care appropriate due to severity of illness   Dispo: The patient is from: Home              Anticipated d/c is to: To be determined              Anticipated d/c date is: > 3 days              Patient currently is not medically stable to d/c.  Requiring dialysis and has multiorgan failure requiring BiPAP   Difficult to place patient Yes              Level of care: Stepdown  Consultants:   Psychiatry  Nephrology  Neurology  ID  Procedures:  Antimicrobials:  Vancomycin Cefepime   acyclovir  Data Reviewed: I have personally reviewed following labs and imaging studies  CBC: Recent Labs  Lab 08/26/20 0508 08/27/20 0643 08/28/20 0902 08/29/20 1018 08/30/20 1000 08/31/20 0911  WBC 18.3* 15.3* 7.8 7.7 6.3 7.8  NEUTROABS 16.7*  --   --   --   --   --   HGB 14.9 11.8* 9.8* 10.0* 9.7* 10.1*  HCT 46.0 35.2* 29.6* 29.9* 28.9* 31.0*  MCV 94.7 92.6 91.4 91.2 92.0 92.5  PLT 475* 281 268 285 249 241  Basic Metabolic Panel: Recent Labs  Lab 08/27/20 0643 08/28/20 0902 08/29/20 1018 08/30/20 1000 08/31/20 0911  NA 145 144 143 143 141  K 5.4* 3.9 4.2 4.1 4.1  CL 110 105 104 101 100  CO2 17* 23 20* 22 20*  GLUCOSE 75 120* 94 80 72  BUN 63* 87* 96* 77* 50*  CREATININE 5.55* 6.92* 8.43* 7.49* 6.00*  CALCIUM 9.2 8.5* 8.7* 8.8* 8.9  PHOS  --  5.8* 7.3* 7.7* 7.0*   GFR: Estimated Creatinine Clearance: 9.8 mL/min (A) (by C-G formula based on SCr of 6 mg/dL (H)). Liver Function Tests: Recent Labs  Lab 08/26/20 0508 08/28/20 0902 08/29/20 1018 08/30/20 1000 08/31/20 0911  AST 46*  --   --  20  --   ALT 20  --   --  <5  --   ALKPHOS 112  --   --  75  --   BILITOT 0.5  --   --  0.6  --   PROT 9.0*  --   --  6.5  --   ALBUMIN 4.5 2.9* 2.9* 2.7*  2.7* 2.7*   No results for input(s): LIPASE, AMYLASE in the last 168 hours. Recent Labs  Lab  08/27/20 1557  AMMONIA 28   Coagulation Profile: Recent Labs  Lab 08/28/20 0902  INR 1.1   Cardiac Enzymes: Recent Labs  Lab 08/27/20 0911 08/29/20 1018  CKTOTAL 364* 183   BNP (last 3 results) No results for input(s): PROBNP in the last 8760 hours. HbA1C: No results for input(s): HGBA1C in the last 72 hours. CBG: Recent Labs  Lab 08/28/20 1449 08/29/20 1152 08/30/20 1127  GLUCAP 96 83 95   Lipid Profile: No results for input(s): CHOL, HDL, LDLCALC, TRIG, CHOLHDL, LDLDIRECT in the last 72 hours. Thyroid Function Tests: No results for input(s): TSH, T4TOTAL, FREET4, T3FREE, THYROIDAB in the last 72 hours. Anemia Panel: No results for input(s): VITAMINB12, FOLATE, FERRITIN, TIBC, IRON, RETICCTPCT in the last 72 hours. Sepsis Labs: Recent Labs  Lab 08/26/20 0508 08/26/20 0640 08/26/20 1030 08/26/20 1451 08/27/20 1235 08/27/20 1510  PROCALCITON 1.77  --   --   --   --   --   LATICACIDVEN  --    < > 2.3* 2.8* 3.0* 2.1*   < > = values in this interval not displayed.    Recent Results (from the past 240 hour(s))  Resp Panel by RT-PCR (Flu A&B, Covid) Urine, Clean Catch     Status: None   Collection Time: 08/26/20  5:08 AM   Specimen: Urine, Clean Catch; Nasopharyngeal(NP) swabs in vial transport medium  Result Value Ref Range Status   SARS Coronavirus 2 by RT PCR NEGATIVE NEGATIVE Final    Comment: (NOTE) SARS-CoV-2 target nucleic acids are NOT DETECTED.  The SARS-CoV-2 RNA is generally detectable in upper respiratory specimens during the acute phase of infection. The lowest concentration of SARS-CoV-2 viral copies this assay can detect is 138 copies/mL. A negative result does not preclude SARS-Cov-2 infection and should not be used as the sole basis for treatment or other patient management decisions. A negative result may occur with  improper specimen collection/handling, submission of specimen other than nasopharyngeal swab, presence of viral mutation(s)  within the areas targeted by this assay, and inadequate number of viral copies(<138 copies/mL). A negative result must be combined with clinical observations, patient history, and epidemiological information. The expected result is Negative.  Fact Sheet for Patients:  EntrepreneurPulse.com.au  Fact Sheet for Healthcare Providers:  IncredibleEmployment.be  This test is no t yet approved or cleared by the [place] and  has been authorized for detection and/or diagnosis of SARS-CoV-2 by FDA under an Emergency Use Authorization (EUA). This EUA will remain  in effect (meaning this test can be used) for the duration of the COVID-19 declaration under Section 564(b)(1) of the Act, 21 U.S.C.section 360bbb-3(b)(1), unless the authorization is terminated  or revoked sooner.       Influenza A by PCR NEGATIVE NEGATIVE Final   Influenza B by PCR NEGATIVE NEGATIVE Final    Comment: (NOTE) The Xpert Xpress SARS-CoV-2/FLU/RSV plus assay is intended as an aid in the diagnosis of influenza from Nasopharyngeal swab specimens and should not be used as a sole basis for treatment. Nasal washings and aspirates are unacceptable for Xpert Xpress SARS-CoV-2/FLU/RSV testing.  Fact Sheet for Patients: EntrepreneurPulse.com.au  Fact Sheet for Healthcare Providers: IncredibleEmployment.be  This test is not yet approved or cleared by the [place] FDA and has been authorized for detection and/or diagnosis of SARS-CoV-2 by FDA under an Emergency Use Authorization (EUA). This EUA will remain in effect (meaning this test can be used) for the duration of the COVID-19 declaration under Section 564(b)(1) of the Act, 21 U.S.C. section 360bbb-3(b)(1), unless the authorization is terminated or revoked.  Performed at [facility], [address] [place], [place] [address]   Urine culture     Status: None   Collection  Time: 08/26/20  5:54 AM   Specimen: Urine, Clean Catch  Result Value Ref Range Status   Specimen Description   Final    URINE, CLEAN CATCH Performed at [facility], [address], [place], [place] [address]    Special Requests   Final    NONE Performed at [facility], [address] [place], [place] [address]    Culture   Final    NO GROWTH Performed at [facility], [place] [address] [place], [place] [address]    Report Status 08/27/2020 FINAL  Final  Culture, blood (routine x 2)     Status: None   Collection Time: 08/26/20  6:03 AM   Specimen: Left Antecubital; Blood  Result Value Ref Range Status   Specimen Description LEFT ANTECUBITAL  Final   Special Requests   Final    BOTTLES DRAWN AEROBIC AND ANAEROBIC Blood Culture adequate volume   Culture   Final    NO GROWTH 5 DAYS Performed at [facility], [address] [place], [place] [address]    Report Status 08/31/2020 FINAL  Final  Culture, blood (routine x 2)     Status: None   Collection Time: 08/26/20  8:55 AM   Specimen: BLOOD LEFT FOREARM  Result Value Ref Range Status   Specimen Description BLOOD LEFT FOREARM  Final   Special Requests   Final    BOTTLES DRAWN AEROBIC AND ANAEROBIC Blood Culture adequate volume   Culture   Final    NO GROWTH 5 DAYS Performed at [facility], [address], [place], [place] [address]    Report Status 08/31/2020 FINAL  Final  CSF culture w Gram Stain     Status: None   Collection Time: 08/28/20 12:01 PM   Specimen: PATH Cytology CSF; Cerebrospinal Fluid  Result Value Ref Range Status   Specimen Description   Final    CSF Performed at [facility], [address], [place],  [address]    Special Requests  Final    NONE Performed at [facility], [place] [address], [place], [place] [address]    Gram Stain   Final    WBC SEEN RED BLOOD CELLS NO ORGANISMS SEEN Performed at [facility], [address] [place], [place] [address]    Culture   Final    NO GROWTH Performed at [facility], [place] [address], [place], [place] [address]    Report Status 08/31/2020 FINAL  Final  Culture, fungus without smear     Status: None (Preliminary result)   Collection Time: 08/28/20 12:01 PM   Specimen: PATH Cytology CSF; Cerebrospinal Fluid  Result Value Ref Range Status   Specimen Description   Final    CSF Performed at [facility], [address], [place], [place] [address]    Special Requests NONE  Final   Culture   Final    NO FUNGUS ISOLATED AFTER 3 DAYS Performed at [facility], [place] [address], [place], [place] [address]    Report Status PENDING  Incomplete     Radiology Studies: EEG  Result Date: 08/30/2020 Lora Havens, MD     08/30/2020  4:01 PM Patient Name: ROZLYNN LIPPOLD MRN: [id number] Epilepsy Attending: Lora Havens Referring Physician/Provider: Dr Malvin Johns Date: 08/30/2020 Duration: 33.37  mins  Patient history: 60yo F with ams. EEG to evaluate for seizure  Level of alertness: Awake  AEDs during EEG study: LTG, LRZ  Technical aspects: This EEG study was done with scalp electrodes positioned according to the 10-20 International system of electrode placement. Electrical activity was acquired at a sampling rate of '500Hz'  and reviewed with a high frequency filter of '70Hz'  and a low frequency filter of '1Hz' . EEG data were recorded continuously and digitally stored.  Description: No posterior dominant rhythm was seen. EEG showed continuous generalized 3 to 6 Hz theta-delta slowing.  Hyperventilation and photic stimulation were not performed.    ABNORMALITY -Continuous slow, generalized  IMPRESSION: This study is suggestive of moderate diffuse encephalopathy, nonspecific etiology. No seizures or epileptiform discharges were seen throughout the recording.  Priyanka Barbra Sarks    Scheduled Meds: . carbidopa-levodopa  1 tablet Oral Daily  .  carbidopa-levodopa  2 tablet Oral BID  . Carbidopa-Levodopa ER  1 tablet Oral TID  . Carbidopa-Levodopa ER  2 tablet Oral QHS  . Chlorhexidine Gluconate Cloth  6 each Topical Q0600  . Chlorhexidine Gluconate Cloth  6 each Topical Q0600  . vitamin B-12  1,000 mcg Oral Daily   Or  . cyanocobalamin  1,000 mcg Intramuscular Daily  . heparin injection (subcutaneous)  5,000 Units Subcutaneous Q8H  . Ipratropium-Albuterol  1 puff Inhalation Q6H  . lamoTRIgine  25 mg Oral BID  . propranolol ER  60 mg Oral Daily  . [START ON 09/04/2020] thiamine injection  100 mg Intravenous Daily   Continuous Infusions: . cefTRIAXone (ROCEPHIN)  IV Stopped (08/30/20 2248)  . thiamine injection Stopped (08/30/20 1630)     LOS: 5 days   Time spent : 20 minutes.  Max Sane, MD [facility]  If 7PM-7AM, please contact night-coverage [email or web address]  08/31/2020, 3:36 PM   This record has been created using Systems analyst. Errors have been sought and corrected,but may not always be located. Such creation errors do not reflect on the standard of care.

## 2020-08-31 NOTE — Progress Notes (Signed)
Patient woke up, requested to speak with her mother over the phone. Staff facilitated phone call. Patient is now alert and oriented to self, place, situation, and year although she could not say what month it is.

## 2020-08-31 NOTE — Progress Notes (Signed)
Pt taken off bipap and placed on 2lpm [place], sats 98%, tolerating well at this time.

## 2020-08-31 NOTE — Progress Notes (Signed)
[place], [place] 08/31/20  Subjective:  [name] is a 60 y.o. white female with  bipolar, depression, anxiety, parkinson's, epilepsy, HTN, and COPD. She presents to the ED with AMS. Concern for overdose (cariprazine+alprazolam) versus urinary tract infection with sepsis.    Initiated on hemodialysis treatments due to worsening renal injury. Has completed two treatments.  Anuric  Third treatment today.   Objective:  Vital signs in last 24 hours:  Temp:  [98.3 F (36.8 C)-99.9 F (37.7 C)] 99.9 F (37.7 C) (02/17 2000) Pulse Rate:  [90-101] 101 (02/18 0248) Resp:  [13-26] 13 (02/17 2200) BP: (151-172)/(84-99) 172/97 (02/17 2200) SpO2:  [97 %-100 %] 99 % (02/18 0248)  Weight change:  Filed Weights   08/27/20 0100 08/28/20 0245 08/29/20 0337  Weight: 64.1 kg 66.8 kg 67.1 kg    Intake/Output:    Intake/Output Summary (Last 24 hours) at 08/31/2020 9924 Last data filed at 08/30/2020 1600 Gross per 24 hour  Intake 150.06 ml  Output 0 ml  Net 150.06 ml    Physical Exam: General: Ill appearing, laying in bed  Head: BIPAP  Eyes: Anicteric  Neck: Supple, trachea midline  Lungs:  Clear to auscultation  Heart: Regular rate and rhythm  Abdomen:  Soft, nontender,   Extremities:  + peripheral edema.  Neurologic: Not following commands, incoherent speech  Skin: No lesions  Access: Right femoral temp HD catheter 2/68   Basic Metabolic Panel:  Recent Labs  Lab 08/26/20 0508 08/27/20 0643 08/28/20 0902 08/29/20 1018 08/30/20 1000  NA 146* 145 144 143 143  K 4.8 5.4* 3.9 4.2 4.1  CL 112* 110 105 104 101  CO2 12* 17* 23 20* 22  GLUCOSE 148* 75 120* 94 80  BUN 43* 63* 87* 96* 77*  CREATININE 3.53* 5.55* 6.92* 8.43* 7.49*  CALCIUM 9.5 9.2 8.5* 8.7* 8.8*  PHOS  --   --  5.8* 7.3* 7.7*     CBC: Recent Labs  Lab 08/26/20 0508 08/27/20 0643 08/28/20 0902 08/29/20 1018 08/30/20 1000  WBC 18.3* 15.3* 7.8 7.7 6.3  NEUTROABS  16.7*  --   --   --   --   HGB 14.9 11.8* 9.8* 10.0* 9.7*  HCT 46.0 35.2* 29.6* 29.9* 28.9*  MCV 94.7 92.6 91.4 91.2 92.0  PLT 475* 281 268 285 249      Lab Results  Component Value Date   HEPBSAG NON REACTIVE 08/29/2020   HEPBSAB NON REACTIVE 08/29/2020   HEPBIGM NON REACTIVE 08/29/2020      Microbiology:  Recent Results (from the past 240 hour(s))  Resp Panel by RT-PCR (Flu A&B, Covid) Urine, Clean Catch     Status: None   Collection Time: 08/26/20  5:08 AM   Specimen: Urine, Clean Catch; Nasopharyngeal(NP) swabs in vial transport medium  Result Value Ref Range Status   SARS Coronavirus 2 by RT PCR NEGATIVE NEGATIVE Final    Comment: (NOTE) SARS-CoV-2 target nucleic acids are NOT DETECTED.  The SARS-CoV-2 RNA is generally detectable in upper respiratory specimens during the acute phase of infection. The lowest concentration of SARS-CoV-2 viral copies this assay can detect is 138 copies/mL. A negative result does not preclude SARS-Cov-2 infection and should not be used as the sole basis for treatment or other patient management decisions. A negative result may occur with  improper specimen collection/handling, submission of specimen other than nasopharyngeal swab, presence of viral mutation(s) within the areas targeted by this assay, and inadequate number of viral  copies(<138 copies/mL). A negative result must be combined with clinical observations, patient history, and epidemiological information. The expected result is Negative.  Fact Sheet for Patients:  EntrepreneurPulse.com.au  Fact Sheet for Healthcare Providers:  IncredibleEmployment.be  This test is no t yet approved or cleared by the [place] FDA and  has been authorized for detection and/or diagnosis of SARS-CoV-2 by FDA under an Emergency Use Authorization (EUA). This EUA will remain  in effect (meaning this test can be used) for the duration of the COVID-19  declaration under Section 564(b)(1) of the Act, 21 U.S.C.section 360bbb-3(b)(1), unless the authorization is terminated  or revoked sooner.       Influenza A by PCR NEGATIVE NEGATIVE Final   Influenza B by PCR NEGATIVE NEGATIVE Final    Comment: (NOTE) The Xpert Xpress SARS-CoV-2/FLU/RSV plus assay is intended as an aid in the diagnosis of influenza from Nasopharyngeal swab specimens and should not be used as a sole basis for treatment. Nasal washings and aspirates are unacceptable for Xpert Xpress SARS-CoV-2/FLU/RSV testing.  Fact Sheet for Patients: EntrepreneurPulse.com.au  Fact Sheet for Healthcare Providers: IncredibleEmployment.be  This test is not yet approved or cleared by the [place] FDA and has been authorized for detection and/or diagnosis of SARS-CoV-2 by FDA under an Emergency Use Authorization (EUA). This EUA will remain in effect (meaning this test can be used) for the duration of the COVID-19 declaration under Section 564(b)(1) of the Act, 21 U.S.C. section 360bbb-3(b)(1), unless the authorization is terminated or revoked.  Performed at [facility], [address] [place], [place] [address]   Urine culture     Status: None   Collection Time: 08/26/20  5:54 AM   Specimen: Urine, Clean Catch  Result Value Ref Range Status   Specimen Description   Final    URINE, CLEAN CATCH Performed at [facility], [address] [place], [place] [address]    Special Requests   Final    NONE Performed at [facility], [address] [place], [place] [address]    Culture   Final    NO GROWTH Performed at [facility], [place] [address] [place], [place] [address]    Report Status 08/27/2020 FINAL  Final  Culture, blood (routine x 2)     Status: None (Preliminary result)   Collection Time: 08/26/20  6:03 AM   Specimen: Left Antecubital; Blood  Result Value Ref Range Status   Specimen  Description LEFT ANTECUBITAL  Final   Special Requests   Final    BOTTLES DRAWN AEROBIC AND ANAEROBIC Blood Culture adequate volume   Culture   Final    NO GROWTH 4 DAYS Performed at [facility], [address] [place], [place] [address]    Report Status PENDING  Incomplete  Culture, blood (routine x 2)     Status: None (Preliminary result)   Collection Time: 08/26/20  8:55 AM   Specimen: BLOOD LEFT FOREARM  Result Value Ref Range Status   Specimen Description BLOOD LEFT FOREARM  Final   Special Requests   Final    BOTTLES DRAWN AEROBIC AND ANAEROBIC Blood Culture adequate volume   Culture   Final    NO GROWTH 4 DAYS Performed at [facility], [address] [place], [place] [address]    Report Status PENDING  Incomplete  CSF culture w Gram Stain     Status: None (Preliminary result)   Collection Time: 08/28/20 12:01 PM   Specimen: PATH  Cytology CSF; Cerebrospinal Fluid  Result Value Ref Range Status   Specimen Description   Final    CSF Performed at [facility], [address] [place], [place] [address]    Special Requests   Final    NONE Performed at [facility], [place] [address], [place], [place] [address]    Gram Stain   Final    WBC SEEN RED BLOOD CELLS NO ORGANISMS SEEN Performed at [facility], [address] [place], [place] [address]    Culture   Final    NO GROWTH 2 DAYS Performed at [facility], [place] [address] [place], [place] [address]    Report Status PENDING  Incomplete  Culture, fungus without smear     Status: None (Preliminary result)   Collection Time: 08/28/20 12:01 PM   Specimen: PATH Cytology CSF; Cerebrospinal Fluid  Result Value Ref Range Status   Specimen Description   Final    CSF Performed at [facility], [address], [place], [place] [address]    Special Requests NONE  Final   Culture   Final    NO FUNGUS ISOLATED AFTER 2 DAYS Performed at [facility],  [place] [address] [place], [place] [address]    Report Status PENDING  Incomplete    Coagulation Studies: Recent Labs    08/28/20 0902  LABPROT 13.3  INR 1.1    Urinalysis: No results for input(s): COLORURINE, LABSPEC, PHURINE, GLUCOSEU, HGBUR, BILIRUBINUR, KETONESUR, PROTEINUR, UROBILINOGEN, NITRITE, LEUKOCYTESUR in the last 72 hours.  Invalid input(s): APPERANCEUR    Imaging: EEG  Result Date: 08/30/2020 Lora Havens, MD     08/30/2020  4:01 PM Patient Name: CAMBREA KIRT MRN: [id number] Epilepsy Attending: Lora Havens Referring Physician/Provider: Dr Malvin Johns Date: 08/30/2020 Duration: 33.37  mins  Patient history: 60yo F with ams. EEG to evaluate for seizure  Level of alertness: Awake  AEDs during EEG study: LTG, LRZ  Technical aspects: This EEG study was done with scalp electrodes positioned according to the 10-20 International system of electrode placement. Electrical activity was acquired at a sampling rate of 500Hz  and reviewed with a high frequency filter of 70Hz  and a low frequency filter of 1Hz . EEG data were recorded continuously and digitally stored.  Description: No posterior dominant rhythm was seen. EEG showed continuous generalized 3 to 6 Hz theta-delta slowing.  Hyperventilation and photic stimulation were not performed.    ABNORMALITY -Continuous slow, generalized  IMPRESSION: This study is suggestive of moderate diffuse encephalopathy, nonspecific etiology. No seizures or epileptiform discharges were seen throughout the recording.  Lora Havens   DG Chest Port 1 View  Result Date: 08/29/2020 CLINICAL DATA:  Altered mental status with shortness of breath EXAM: PORTABLE CHEST 1 VIEW COMPARISON:  08/26/2020 FINDINGS: Low lung volumes. No consolidation or edema. No pleural effusion or pneumothorax. Cardiomediastinal contours are within normal limits for technique. IMPRESSION: No acute process in the chest. Electronically Signed   By: Macy Mis  M.D.   On: 08/29/2020 14:15     Medications:   . cefTRIAXone (ROCEPHIN)  IV 2 g (08/30/20 2218)  . lactated ringers Stopped (08/29/20 1147)  . thiamine injection 250 mg (08/30/20 1514)   . carbidopa-levodopa  1 tablet Oral Daily  . carbidopa-levodopa  2 tablet Oral BID  . Carbidopa-Levodopa ER  1 tablet Oral TID  . Carbidopa-Levodopa ER  2 tablet Oral QHS  . Chlorhexidine Gluconate Cloth  6 each  Topical Q0600  . Chlorhexidine Gluconate Cloth  6 each Topical Q0600  . vitamin B-12  1,000 mcg Oral Daily   Or  . cyanocobalamin  1,000 mcg Intramuscular Daily  . heparin injection (subcutaneous)  5,000 Units Subcutaneous Q8H  . Ipratropium-Albuterol  1 puff Inhalation Q6H  . lamoTRIgine  25 mg Oral BID  . propranolol ER  60 mg Oral Daily  . [START ON 09/04/2020] thiamine injection  100 mg Intravenous Daily   acetaminophen **OR** acetaminophen, albuterol, LORazepam, ondansetron (ZOFRAN) IV  Assessment/ Plan:  60 y.o. female with PMHX of bipolar, depression, anxiety, parkinson's, epilepsy, HTN, and COPD was admitted on 08/26/2020   Principal Problem:   Delirium Active Problems:   Bipolar 1 disorder (HCC)   COPD (chronic obstructive pulmonary disease) (HCC)   Essential tremor   Migraine without aura   Parkinson's disease ([place])   Epilepsy ([place])   Depression with anxiety   HTN (hypertension)   Acute metabolic encephalopathy   UTI (urinary tract infection)   AKI (acute kidney injury) ([place])   Severe sepsis with septic shock (HCC)  Confusion [R41.0] Severe sepsis with septic shock ([place]) [A41.9, R65.21] Severe sepsis ([place]) [A41.9, R65.20] Altered mental status, unspecified altered mental status type [R41.82] Sepsis, due to unspecified organism, unspecified whether acute organ dysfunction present ([place]) [A41.9]  #. Acute kidney injury with hyperkalemia and metabolic acidosis: baseline creatinine 0.8 with normal GFR on 01/31/2020 - monitor volume status.  - Continues to need renal  replacement therapy. Tolerating intermittent hemodialysis treatment.  Plan on third dialysis treatment today.   # Anion gap metabolic acidosis: Salicylate levels negative, acetaminophen levels negative. Normal glucose. Lactic acidosis has improved.  Most likely due to uremia - renal replacement as above.   # Hypernatremia: secondary to free water deficit.  - improved with dialysis   LOS: 5 Lavonia Dana 2/18/20227:06 [place], [place]

## 2020-09-01 ENCOUNTER — Encounter: Payer: Self-pay | Admitting: Internal Medicine

## 2020-09-01 DIAGNOSIS — G9341 Metabolic encephalopathy: Secondary | ICD-10-CM | POA: Diagnosis not present

## 2020-09-01 DIAGNOSIS — N179 Acute kidney failure, unspecified: Secondary | ICD-10-CM | POA: Diagnosis not present

## 2020-09-01 DIAGNOSIS — F319 Bipolar disorder, unspecified: Secondary | ICD-10-CM | POA: Diagnosis not present

## 2020-09-01 LAB — BASIC METABOLIC PANEL
Anion gap: 20 — ABNORMAL HIGH (ref 5–15)
BUN: 36 mg/dL — ABNORMAL HIGH (ref 6–20)
CO2: 22 mmol/L (ref 22–32)
Calcium: 8.9 mg/dL (ref 8.9–10.3)
Chloride: 97 mmol/L — ABNORMAL LOW (ref 98–111)
Creatinine, Ser: 4.49 mg/dL — ABNORMAL HIGH (ref 0.44–1.00)
GFR, Estimated: 11 mL/min — ABNORMAL LOW (ref >60)
Glucose, Bld: 77 mg/dL (ref 70–99)
Potassium: 3.9 mmol/L (ref 3.5–5.1)
Sodium: 139 mmol/L (ref 135–145)

## 2020-09-01 LAB — CBC
HCT: 29.4 % — ABNORMAL LOW (ref 36.0–46.0)
Hemoglobin: 9.7 g/dL — ABNORMAL LOW (ref 12.0–15.0)
MCH: 30.2 pg (ref 26.0–34.0)
MCHC: 33 g/dL (ref 30.0–36.0)
MCV: 91.6 fL (ref 80.0–100.0)
Platelets: 237 10*3/uL (ref 150–400)
RBC: 3.21 MIL/uL — ABNORMAL LOW (ref 3.87–5.11)
RDW: 13.5 % (ref 11.5–15.5)
WBC: 7.5 10*3/uL (ref 4.0–10.5)
nRBC: 0 % (ref 0.0–0.2)

## 2020-09-01 LAB — MAGNESIUM: Magnesium: 2.2 mg/dL (ref 1.7–2.4)

## 2020-09-01 LAB — PHOSPHORUS: Phosphorus: 6.9 mg/dL — ABNORMAL HIGH (ref 2.5–4.6)

## 2020-09-01 MED ORDER — HYDRALAZINE HCL 25 MG PO TABS
25.0000 mg | ORAL_TABLET | Freq: Four times a day (QID) | ORAL | Status: DC
  Administered 2020-09-01 – 2020-09-05 (×7): 25 mg via ORAL
  Filled 2020-09-01 (×12): qty 1

## 2020-09-01 NOTE — Evaluation (Signed)
Occupational Therapy Evaluation Patient Details Name: Stacy Pearson MRN: [id number] DOB: 02-26-1961 Today's Date: 09/01/2020    History of Present Illness Pt admitted to 08/26/20 for OD (cariprazine and alprazolam) vs. UTI with sepsis. On presentation, pt was found to have neutrophilic predominant leukocytosis, lactic acidosis at 6.7, AKI, tachycardic, and tachypneic. CXR with low lung volumes and no infiltrate; HCT negative. RR called on 2/16. PMH significant for: bipolar, depression, anxiety, PD, epilepsy, HTN, and COPD.   Clinical Impression   Ms Dickard was seen for OT/Pt co-evaluation this date. Prior to hospital admission, pt was Independent in ADLs and assisted with IADLs including caring for 2 small dogs. Pt lives c mother in home with 5 STE. Pt presents to acute OT demonstrating impaired ADL performance and functional mobility 2/2 decreased safety awareness, functional strength/ROM/balance deficits, poor motor planning, poor insight into deficits, and impaired functional use of BUE. Pt is pleasant and communicative t/o session.   Pt currently requires MOD A x2 exit R side of bed. Initially requires MOD A sitting balance, decreasing to CGA. MOD A x2 self-drinking seated EOB - assist for sitting balance and hand over hand LUE to mouth, increasing to MAX hand over hand as pt fatigued. MIN A x2 + HHA sit<>stand - decreasing to MOD A x2 for ~3 side steps at EOB. Pt requires tactile cues to initiate weight shift. Pt would benefit from skilled OT to address noted impairments and functional limitations (see below for any additional details) in order to maximize safety and independence while minimizing falls risk and caregiver burden. Upon hospital discharge, recommend CIR to maximize pt safety and return to PLOF.     Follow Up Recommendations  CIR    Equipment Recommendations  Other (comment) (TBD at next venue of care)    Recommendations for Other Services       Precautions /  Restrictions Precautions Precautions: Fall Precaution Comments: DNR Restrictions Weight Bearing Restrictions: No      Mobility Bed Mobility Overal bed mobility: Needs Assistance Bed Mobility: Supine to Sit;Sit to Supine     Supine to sit: Mod assist;+2 for physical assistance;HOB elevated Sit to supine: Max assist;+2 for physical assistance;HOB elevated   General bed mobility comments: Mod-Max +2 for trunk and BLE facilitation to sit EOB with HOB elevated. Increased multimodal cues for sequencing and safety.    Transfers Overall transfer level: Needs assistance Equipment used: 2 person hand held assist Transfers: Sit to/from Stand Sit to Stand: Min assist;+2 physical assistance;From elevated surface         General transfer comment: MOD A x2 for ~3 side steps    Balance Overall balance assessment: Needs assistance Sitting-balance support: Feet supported Sitting balance-Leahy Scale: Poor Sitting balance - Comments: Intermittent seated balance ranging from poor-fair; increased multimodal cues required for upright posture. Pt able to tolerate ~83min total of seated balance at EOB. Decreased balance noted with decreased UE support. Postural control: Posterior lean;Left lateral lean Standing balance support: Bilateral upper extremity supported;During functional activity Standing balance-Leahy Scale: Poor Standing balance comment: Poor standing balance with unilateral-bil HHA. Assist ranging from mod-CGA                           ADL either performed or assessed with clinical judgement   ADL Overall ADL's : Needs assistance/impaired  General ADL Comments: MOD A x2 self-drinking seated EOB - assist for sitting balance and hand over hand LUE to mouth - increasing to MAX hand over hand as pt fatigued. MAX A don B socks at bed level. MOD A x2 for ADL t/f                  Pertinent Vitals/Pain Pain Assessment:  No/denies pain     Hand Dominance     Extremity/Trunk Assessment Upper Extremity Assessment Upper Extremity Assessment: RUE deficits/detail;LUE deficits/detail RUE Deficits / Details: Grip 2+/5. AROM elbow flexion ~15*, 1/5 proximal shoulder strength LUE Deficits / Details: Grip 3/5. AROM elbow flexion ~30*, 2-/5 proximal shoulder strength   Lower Extremity Assessment Lower Extremity Assessment: Generalized weakness       Communication Communication Communication: Other (comment) (dysarthria)   Cognition Arousal/Alertness: Awake/alert Behavior During Therapy: WFL for tasks assessed/performed Overall Cognitive Status: Impaired/Different from baseline Area of Impairment: Orientation;Following commands;Awareness;Problem solving                 Orientation Level: Disoriented to;Time;Situation     Following Commands: Follows one step commands inconsistently;Follows one step commands with increased time     Problem Solving: Slow processing;Decreased initiation;Difficulty sequencing;Requires verbal cues;Requires tactile cues General Comments: Pt A&O x2, requiring increased time and multimodal cues for tasks.   General Comments  Increased edema noted in bil hands and forearm.    Exercises Exercises: Other exercises Other Exercises Other Exercises: Pt and family educated re: OT role, DME recs, d/c recs, falls prevention, ECS, HEP, edema mgmt Other Exercises: LBD, self-drinking, sup<>sit, sit<>stand, sitting/standing balance/tolerance   Shoulder Instructions      Home Living Family/patient expects to be discharged to:: Private residence Living Arrangements: Parent Available Help at Discharge: Available 24 hours/day (Mom just finished with chemo/radiation and only able to provide supervision/setup) Type of Home: House Home Access: Stairs to enter [employer] of Steps: front entrance - 5 STE bil railing; back entrance - 6 STE bil railing. Entrance Stairs-Rails:  Can reach both Home Layout: One level     Bathroom Shower/Tub: Occupational psychologist: Standard Bathroom Accessibility: Yes   Home Equipment: Cane - single point;Shower seat - built in          Prior Functioning/Environment Level of Independence: Independent        Comments: Mom reports that pt was Ind with ADL's, splitting IADL's, and ambulation without AD.        OT Problem List: Decreased strength;Decreased range of motion;Decreased activity tolerance;Impaired balance (sitting and/or standing);Decreased cognition;Decreased coordination;Decreased safety awareness;Impaired UE functional use      OT Treatment/Interventions: Self-care/ADL training;Therapeutic exercise;Energy conservation;DME and/or AE instruction;Therapeutic activities;Balance training;Patient/family education    OT Goals(Current goals can be found in the care plan section) Acute Rehab OT Goals Patient Stated Goal: to get better OT Goal Formulation: With patient/family Time For Goal Achievement: 09/15/20 Potential to Achieve Goals: Good ADL Goals Pt Will Perform Eating: with min assist;bed level;with caregiver independent in assisting Pt Will Perform Upper Body Dressing: with mod assist;sitting Pt Will Transfer to Toilet: stand pivot transfer;bedside commode;with mod assist (c LRAD PRN)  OT Frequency: Min 3X/week           Co-evaluation PT/OT/SLP Co-Evaluation/Treatment: Yes Reason for Co-Treatment: Complexity of the patient's impairments (multi-system involvement);To address functional/ADL transfers PT goals addressed during session: Mobility/safety with mobility;Proper use of DME OT goals addressed during session: ADL's and self-care      AM-PAC OT "6 Clicks"  Daily Activity     Outcome Measure Help from another person eating meals?: A Lot Help from another person taking care of personal grooming?: A Lot Help from another person toileting, which includes using toliet, bedpan, or  urinal?: A Lot Help from another person bathing (including washing, rinsing, drying)?: A Lot Help from another person to put on and taking off regular upper body clothing?: A Lot Help from another person to put on and taking off regular lower body clothing?: A Lot 6 Click Score: 12   End of Session Equipment Utilized During Treatment: Gait belt Nurse Communication: Mobility status  Activity Tolerance: Patient tolerated treatment well Patient left: in bed;with call bell/phone within reach;with family/visitor present;with nursing/sitter in room  OT Visit Diagnosis: Other abnormalities of gait and mobility (R26.89);Muscle weakness (generalized) (M62.81)                Time: 4235-3614 OT Time Calculation (min): 38 min Charges:  OT General Charges $OT Visit: 1 Visit OT Evaluation $OT Eval Moderate Complexity: 1 Mod OT Treatments $Self Care/Home Management : 8-22 mins  Dessie Coma, M.S. OTR/L  09/01/20, 4:26 PM  ascom [phone]

## 2020-09-01 NOTE — Progress Notes (Addendum)
Inpatient Rehab Admissions Coordinator Note:   Per therapy recommendations, pt was screened for CIR candidacy by Clemens Catholic, [place] CCC-SLP. At this time, Pt. Appears to have functional decline but does not demonstrate medical necessity for CIR admit. Deficits may resolve as acute medical issues resolve. It not, Pt. May benefit from rehab at a lower level of care.   Please contact me with questions.   Clemens Catholic, [place], [place] Admissions Coordinator  [phone] ([place]) [phone] (office)

## 2020-09-01 NOTE — Progress Notes (Incomplete)
Pt has remaiend

## 2020-09-01 NOTE — Evaluation (Addendum)
Physical Therapy Evaluation Patient Details Name: Stacy Pearson MRN: [id number] DOB: February 21, 1961 Today's Date: 09/01/2020   History of Present Illness  Pt admitted to 08/26/20 for OD (cariprazine and alprazolam) vs. UTI with sepsis. On presentation, pt was found to have neutrophilic predominant leukocytosis, lactic acidosis at 6.7, AKI, tachycardic, and tachypneic. CXR with low lung volumes and no infiltrate; HCT negative. RR called on 2/16. PMH significant for: bipolar, depression, anxiety, PD, epilepsy, HTN, and COPD.    Clinical Impression  Pt is a 60 year old F admitted to hospital on 08/26/20 for delirium secondary to OD vs. UTI with sepsis. Mother at bedside and able to provide PLOF. At baseline, pt was Ind with all ADL's, splitting IADL's, and limited ambulation without AD. Pt presents with confusion, functional weakness (UE>LE), edema, poor motor control/planning/grading/coordination, decreased gross balance, and decreased activity tolerance resulting in impaired functional mobility from baseline. Due to deficits, pt required mod-max assist +2 for bed mobility, min assist +2 for tranfers, and mod assist +2 to take 3 steps at bedside with bil HHA. Due to pt presentation, she required increased tactile cueing for RLE facilitation and weight shift during gait. Pt able to tolerate ~88min of seated balance at EOB with assist levels ranging from CGA-mod assist for maintenance of upright posture. Increased time required during session due to pt AMS, requiring increased multimodal cues for sequencing and safety. Deficits limit the pt's ability to safely and independently perform ADL's, transfer, and ambulate, and increase her risk of falls. Pt will benefit from acute skilled PT services to address deficits for return to baseline function. At this time, PT recommends CIR at DC to address deficits.improve overall safety with functional mobility, and decrease caregiver burden prior to return home.     Follow  Up Recommendations CIR;Supervision/Assistance - 24 hour;Supervision for mobility/OOB    Equipment Recommendations  Other (comment) (defer to post acute)    Recommendations for Other Services Rehab consult     Precautions / Restrictions Precautions Precautions: Fall Precaution Comments: DNR Restrictions Weight Bearing Restrictions: No      Mobility  Bed Mobility Overal bed mobility: Needs Assistance Bed Mobility: Supine to Sit;Sit to Supine     Supine to sit: Mod assist;+2 for physical assistance;HOB elevated Sit to supine: Max assist;+2 for physical assistance;HOB elevated   General bed mobility comments: Mod-Max +2 for trunk and BLE facilitation to sit EOB with HOB elevated. Increased multimodal cues for sequencing and safety.    Transfers Overall transfer level: Needs assistance   Transfers: Sit to/from Stand Sit to Stand: Min assist;+2 physical assistance;From elevated surface         General transfer comment: Min assist +2 to stand from elevated surface with HHA. Multimodal cues for sequencing for safety. Initial posterior lean that pt was able to correct with cues for anterior weight shift  Ambulation/Gait Ambulation/Gait assistance: Mod assist Gait Distance (Feet): 1 Feet Assistive device: 2 person hand held assist   Gait velocity: decreased   General Gait Details: Mod assist +2 to take ~3 steps at bedside with bil HHA. Due to impaired motor control, planning, coordination, and grading pt required increased multimodal cues for BLE facilitation (R>L) and weight shift. Decreased step length/foot clearance noted bil with R>L.     Balance Overall balance assessment: Needs assistance Sitting-balance support: Feet supported Sitting balance-Leahy Scale: Poor Sitting balance - Comments: Intermittent seated balance ranging from poor-fair; increased multimodal cues required for upright posture. Pt able to tolerate ~40min total of seated balance  at EOB to drink liquid  protein drink with OT. Decreased balance noted with decreased UE support. Postural control: Posterior lean;Left lateral lean Standing balance support: Bilateral upper extremity supported;During functional activity Standing balance-Leahy Scale: Poor Standing balance comment: Poor standing balance with unilateral-bil HHA. Assist ranging from mod-CGA                             Pertinent Vitals/Pain Pain Assessment: No/denies pain    Home Living Family/patient expects to be discharged to:: Private residence Living Arrangements: Parent Available Help at Discharge: Available 24 hours/day (Mom just finished with chemo/radiation and only able to provide supervision) Type of Home: House Home Access: Stairs to enter Entrance Stairs-Rails: Can reach both Entrance Stairs-Number of Steps: front entrance - 5 STE bil railing; back entrance - 6 STE bil railing. Home Layout: One level Home Equipment: Cane - single point;Shower seat - built in      Prior Function Level of Independence: Independent         Comments: Mom reports that pt was Ind with ADL's, splitting IADL's, and ambulation without AD.     Hand Dominance        Extremity/Trunk Assessment   Upper Extremity Assessment Upper Extremity Assessment: Defer to OT evaluation    Lower Extremity Assessment Lower Extremity Assessment: Overall WFL for tasks assessed (RLE 3+/5, LLE 4+/5; impaired motor control, coordination, grading, and planning)       Communication   Communication:  (dysarthria)  Cognition Arousal/Alertness: Awake/alert Behavior During Therapy: WFL for tasks assessed/performed Overall Cognitive Status: Impaired/Different from baseline Area of Impairment: Orientation;Following commands;Awareness;Problem solving                 Orientation Level: Disoriented to;Time;Situation (able to state time with max cues)     Following Commands: Follows one step commands inconsistently;Follows one step  commands with increased time     Problem Solving: Slow processing;Decreased initiation;Difficulty sequencing;Requires verbal cues;Requires tactile cues General Comments: Pt A&O x2, requiring increased time and multimodal cues for tasks.      General Comments General comments (skin integrity, edema, etc.): Increased edema noted in bil hands and forearm.    Exercises Other Exercises Other Exercises: Pt able to participate in bed mobility, transfers, and minimal gait with increased assist of min-max +2. Increased multimodal cueing required for safety and sequencing. Due to poor motor control, grading, planning, and coordination, pt required increased tactile cues for RLE faciliation and weight shift for advancement. Other Exercises: Pt and mom educated regarding: PT role/POC, DC recommendations, pt current deficits/presentation, edema management, benefit of upright sitting in bed.   Assessment/Plan    PT Assessment Patient needs continued PT services  PT Problem List Decreased strength;Decreased mobility;Decreased safety awareness;Decreased activity tolerance;Decreased cognition;Decreased balance       PT Treatment Interventions Gait training;Functional mobility training;Therapeutic activities;Stair training;Therapeutic exercise;Balance training;Neuromuscular re-education    PT Goals (Current goals can be found in the Care Plan section)  Acute Rehab PT Goals Patient Stated Goal: to get better PT Goal Formulation: With patient/family Time For Goal Achievement: 09/15/20 Potential to Achieve Goals: Fair    Frequency Min 3X/week   Barriers to discharge Decreased caregiver support mom only able to provide 24/7 supervision    Co-evaluation PT/OT/SLP Co-Evaluation/Treatment: Yes Reason for Co-Treatment: Complexity of the patient's impairments (multi-system involvement);Necessary to address cognition/behavior during functional activity;For patient/therapist safety;To address  functional/ADL transfers PT goals addressed during session: Mobility/safety with mobility;Balance;Strengthening/ROM OT goals addressed  during session: ADL's and self-care;Strengthening/ROM       AM-PAC PT "6 Clicks" Mobility  Outcome Measure Help needed turning from your back to your side while in a flat bed without using bedrails?: A Lot Help needed moving from lying on your back to sitting on the side of a flat bed without using bedrails?: A Lot Help needed moving to and from a bed to a chair (including a wheelchair)?: A Lot Help needed standing up from a chair using your arms (e.g., wheelchair or bedside chair)?: A Lot Help needed to walk in hospital room?: A Lot Help needed climbing 3-5 steps with a railing? : Total 6 Click Score: 11    End of Session Equipment Utilized During Treatment: Gait belt Activity Tolerance: Patient tolerated treatment well;Patient limited by fatigue Patient left: in bed;with call bell/phone within reach;with bed alarm set;with family/visitor present;with nursing/sitter in room (bed in chair position, pillows under BUE) Nurse Communication: Mobility status PT Visit Diagnosis: Unsteadiness on feet (R26.81);Muscle weakness (generalized) (M62.81);Difficulty in walking, not elsewhere classified (R26.2);Other symptoms and signs involving the nervous system (K35.075)    Time: 7322-5672 PT Time Calculation (min) (ACUTE ONLY): 40 min   Charges:   PT Evaluation $PT Eval Moderate Complexity: 1 Mod PT Treatments $Therapeutic Activity: 8-22 mins       Herminio Commons, PT, DPT 4:09 PM,09/01/20

## 2020-09-01 NOTE — Progress Notes (Signed)
PROGRESS NOTE    Stacy Pearson  ZOX:[id number] DOB: [birth year] DOA: 08/26/2020 PCP: Valera Castle, MD   Brief Narrative: Taken from H&P. Stacy Pearson is a 60 y.o. female with medical history significant of bipolar, depression, anxiety, Parkinson, epilepsy, HTN, COPD, presents with AMS. On presentation patient was confused and agitated.  Admitting provider was able to contact her mother who is the only contact listed, and according to her she lives alone and was at her normal state of health until Friday afternoon. She was sleeping most of the time since then and found to be more confused and somnolent and EMS was called. Mother was not aware of any recent illnesses. On presentation she was found to have neutrophilic predominant leukocytosis, lactic acidosis at 6.7, Covid PCR negative, AKI, tachycardic and tachypneic.  Chest x-ray with low lung volumes and no infiltrate.  CT head was negative.  Lactic acidosis started improving, worsening renal function and hyperkalemia next morning.  Procalcitonin elevated at 1.77.  Blood and urine cultures negative.  Remained very altered, unable to provide any history. Worsening renal function, nephrology started emergent HD on 2/16 after transfer to ICU/SD Neurology was consulted with persistent altered mental status, LP was done and CSF not c/w infectious etio, on empiric rocephin   Subjective: Much more awake and alert and following commands.  Requesting to go home.  Still very weak Assessment & Plan:   Principal Problem:   Delirium Active Problems:   Bipolar 1 disorder (HCC)   COPD (chronic obstructive pulmonary disease) (HCC)   Essential tremor   Migraine without aura   Parkinson's disease ([place])   Epilepsy ([place])   Depression with anxiety   HTN (hypertension)   Acute metabolic encephalopathy   UTI (urinary tract infection)   AKI (acute kidney injury) ([place])   Severe sepsis with septic shock (HCC)  Acute Metabolic  encephalopathy   Present on admission due to polypharmacy and possible overdose Significant improvement in her mental status with dialysis Antibiotics discontinued per ID as urine, blood and CSF culture remain negative  AKI -requiring dialysis per nephrology  Acute hypoxic respiratory failure Now resolved, weaned off BiPAP and on nasal cannula oxygen.  Hyperkalemia.  Resolved  Most likely secondary to worsening renal function.  Concern of renal tubular acidosis.  Anion gap metabolic acidosis.  Most likely secondary to lactic acidosis. Elevated CK so rhabdomyolysis can be playing a role. -Improving with HD  Bipolar 1 disorder with depression and anxiety.  Psych is recommending continuation of benzos with prn Ativan as she was taking very high dose at baseline to prevent any withdrawal. -Continue with Ativan 2 mg IV Q 2 hrs prn -Keep holding rest of her sedating medications.  COPD.  Appears stable, no wheezing and chest x-ray without any acute abnormality. -As needed bronchodilators  History of Parkinson's disease.  Unknown baseline -Continue with home dose of Sinemet. -Continue with propranolol-do not know whether tremors are essential or secondary to underlying Parkinson's.  History of migraine. -Continue as needed Tylenol  History of seizure disorder  Seizure ruled out, EEG negative for any epileptiform waves Continue Lamictal  Hypertension.  Blood pressure running high On home propranolol. -Increase hydralazine to 25 mg p.o. every 6 hours and continue IV as needed  Generalized weakness PT, OT recommends CIR.  TOC aware   Objective: Vitals:   09/01/20 1100 09/01/20 1200 09/01/20 1300 09/01/20 1417  BP: (!) 171/98 (!) 160/99 (!) 177/97 (!) 177/97  Pulse: 98 94 87  Resp: (!) 25 (!) 24 (!) 34   Temp:      TempSrc:      SpO2: 94% 93% 93%   Weight:      Height:        Intake/Output Summary (Last 24 hours) at 09/01/2020 1549 Last data filed at 09/01/2020 1450 Gross  per 24 hour  Intake 600 ml  Output 205 ml  Net 395 ml   Filed Weights   08/27/20 0100 08/28/20 0245 08/29/20 0337  Weight: 64.1 kg 66.8 kg 67.1 kg    Examination:  General.  Chronically ill-appearing lady, in no acute distress, following most all commands  Pulmonary.  Lungs clear bilaterally, normal respiratory effort.  CV.  Regular rate and rhythm, no JVD, rub or murmur. Abdomen.  Soft, nontender, nondistended, BS positive. CNS.  Alert and oriented today  extremities.  No edema, no cyanosis, pulses intact and symmetrical. Psychiatry.  Judgment and insight still appears somewhat impaired  DVT prophylaxis: Lovenox Code Status: DNR-admitting provider confirmed with mother Family Communication: None Disposition Plan: unknown Status is: Inpatient    Remains inpatient appropriate because:Inpatient level of care appropriate due to severity of illness   Dispo: The patient is from: Home              Anticipated d/c is to: To be determined              Anticipated d/c date is: > 3 days              Patient currently is not medically stable to d/c.  Requiring dialysis and has multiorgan failure requiring BiPAP   Difficult to place patient Yes              Level of care: Med-Surg  Consultants:   Psychiatry  Nephrology  Neurology  ID  Procedures:  Antimicrobials:  None  Data Reviewed: I have personally reviewed following labs and imaging studies  CBC: Recent Labs  Lab 08/26/20 0508 08/27/20 0643 08/28/20 0902 08/29/20 1018 08/30/20 1000 08/31/20 0911 09/01/20 0531  WBC 18.3*   < > 7.8 7.7 6.3 7.8 7.5  NEUTROABS 16.7*  --   --   --   --   --   --   HGB 14.9   < > 9.8* 10.0* 9.7* 10.1* 9.7*  HCT 46.0   < > 29.6* 29.9* 28.9* 31.0* 29.4*  MCV 94.7   < > 91.4 91.2 92.0 92.5 91.6  PLT 475*   < > 268 285 249 241 237   < > = values in this interval not displayed.   Basic Metabolic Panel: Recent Labs  Lab 08/28/20 0902 08/29/20 1018 08/30/20 1000 08/31/20 0911  09/01/20 0531  NA 144 143 143 141 139  K 3.9 4.2 4.1 4.1 3.9  CL 105 104 101 100 97*  CO2 23 20* 22 20* 22  GLUCOSE 120* 94 80 72 77  BUN 87* 96* 77* 50* 36*  CREATININE 6.92* 8.43* 7.49* 6.00* 4.49*  CALCIUM 8.5* 8.7* 8.8* 8.9 8.9  MG  --   --   --   --  2.2  PHOS 5.8* 7.3* 7.7* 7.0* 6.9*   GFR: Estimated Creatinine Clearance: 13.1 mL/min (A) (by C-G formula based on SCr of 4.49 mg/dL (H)). Liver Function Tests: Recent Labs  Lab 08/26/20 0508 08/28/20 0902 08/29/20 1018 08/30/20 1000 08/31/20 0911  AST 46*  --   --  20  --   ALT 20  --   --  <5  --  ALKPHOS 112  --   --  75  --   BILITOT 0.5  --   --  0.6  --   PROT 9.0*  --   --  6.5  --   ALBUMIN 4.5 2.9* 2.9* 2.7*  2.7* 2.7*   No results for input(s): LIPASE, AMYLASE in the last 168 hours. Recent Labs  Lab 08/27/20 1557  AMMONIA 28   Coagulation Profile: Recent Labs  Lab 08/28/20 0902  INR 1.1   Cardiac Enzymes: Recent Labs  Lab 08/27/20 0911 08/29/20 1018  CKTOTAL 364* 183   BNP (last 3 results) No results for input(s): PROBNP in the last 8760 hours. HbA1C: No results for input(s): HGBA1C in the last 72 hours. CBG: Recent Labs  Lab 08/28/20 1449 08/29/20 1152 08/30/20 1127  GLUCAP 96 83 95   Lipid Profile: No results for input(s): CHOL, HDL, LDLCALC, TRIG, CHOLHDL, LDLDIRECT in the last 72 hours. Thyroid Function Tests: No results for input(s): TSH, T4TOTAL, FREET4, T3FREE, THYROIDAB in the last 72 hours. Anemia Panel: No results for input(s): VITAMINB12, FOLATE, FERRITIN, TIBC, IRON, RETICCTPCT in the last 72 hours. Sepsis Labs: Recent Labs  Lab 08/26/20 0508 08/26/20 0640 08/26/20 1030 08/26/20 1451 08/27/20 1235 08/27/20 1510  PROCALCITON 1.77  --   --   --   --   --   LATICACIDVEN  --    < > 2.3* 2.8* 3.0* 2.1*   < > = values in this interval not displayed.    Recent Results (from the past 240 hour(s))  Resp Panel by RT-PCR (Flu A&B, Covid) Urine, Clean Catch     Status:  None   Collection Time: 08/26/20  5:08 AM   Specimen: Urine, Clean Catch; Nasopharyngeal(NP) swabs in vial transport medium  Result Value Ref Range Status   SARS Coronavirus 2 by RT PCR NEGATIVE NEGATIVE Final    Comment: (NOTE) SARS-CoV-2 target nucleic acids are NOT DETECTED.  The SARS-CoV-2 RNA is generally detectable in upper respiratory specimens during the acute phase of infection. The lowest concentration of SARS-CoV-2 viral copies this assay can detect is 138 copies/mL. A negative result does not preclude SARS-Cov-2 infection and should not be used as the sole basis for treatment or other patient management decisions. A negative result may occur with  improper specimen collection/handling, submission of specimen other than nasopharyngeal swab, presence of viral mutation(s) within the areas targeted by this assay, and inadequate number of viral copies(<138 copies/mL). A negative result must be combined with clinical observations, patient history, and epidemiological information. The expected result is Negative.  Fact Sheet for Patients:  EntrepreneurPulse.com.au  Fact Sheet for Healthcare Providers:  IncredibleEmployment.be  This test is no t yet approved or cleared by the [place] FDA and  has been authorized for detection and/or diagnosis of SARS-CoV-2 by FDA under an Emergency Use Authorization (EUA). This EUA will remain  in effect (meaning this test can be used) for the duration of the COVID-19 declaration under Section 564(b)(1) of the Act, 21 U.S.C.section 360bbb-3(b)(1), unless the authorization is terminated  or revoked sooner.       Influenza A by PCR NEGATIVE NEGATIVE Final   Influenza B by PCR NEGATIVE NEGATIVE Final    Comment: (NOTE) The Xpert Xpress SARS-CoV-2/FLU/RSV plus assay is intended as an aid in the diagnosis of influenza from Nasopharyngeal swab specimens and should not be used as a sole basis for  treatment. Nasal washings and aspirates are unacceptable for Xpert Xpress SARS-CoV-2/FLU/RSV testing.  Fact Sheet  for Patients: EntrepreneurPulse.com.au  Fact Sheet for Healthcare Providers: IncredibleEmployment.be  This test is not yet approved or cleared by the [place] FDA and has been authorized for detection and/or diagnosis of SARS-CoV-2 by FDA under an Emergency Use Authorization (EUA). This EUA will remain in effect (meaning this test can be used) for the duration of the COVID-19 declaration under Section 564(b)(1) of the Act, 21 U.S.C. section 360bbb-3(b)(1), unless the authorization is terminated or revoked.  Performed at [facility], [address], [place], [place] [address]   Urine culture     Status: None   Collection Time: 08/26/20  5:54 AM   Specimen: Urine, Clean Catch  Result Value Ref Range Status   Specimen Description   Final    URINE, CLEAN CATCH Performed at [facility], [address] [place], [place] [address]    Special Requests   Final    NONE Performed at [facility], [address] [place], [place] [address]    Culture   Final    NO GROWTH Performed at [facility], [place] [address], [place], [place] [address]    Report Status 08/27/2020 FINAL  Final  Culture, blood (routine x 2)     Status: None   Collection Time: 08/26/20  6:03 AM   Specimen: Left Antecubital; Blood  Result Value Ref Range Status   Specimen Description LEFT ANTECUBITAL  Final   Special Requests   Final    BOTTLES DRAWN AEROBIC AND ANAEROBIC Blood Culture adequate volume   Culture   Final    NO GROWTH 5 DAYS Performed at [facility], [address] [place], [place] [address]    Report Status 08/31/2020 FINAL  Final  Culture, blood (routine x 2)     Status: None   Collection Time: 08/26/20  8:55 AM   Specimen: BLOOD LEFT FOREARM  Result Value Ref Range Status   Specimen  Description BLOOD LEFT FOREARM  Final   Special Requests   Final    BOTTLES DRAWN AEROBIC AND ANAEROBIC Blood Culture adequate volume   Culture   Final    NO GROWTH 5 DAYS Performed at [facility], [address] [place], [place] [address]    Report Status 08/31/2020 FINAL  Final  CSF culture w Gram Stain     Status: None   Collection Time: 08/28/20 12:01 PM   Specimen: PATH Cytology CSF; Cerebrospinal Fluid  Result Value Ref Range Status   Specimen Description   Final    CSF Performed at [facility], [address] [place], [place] [address]    Special Requests   Final    NONE Performed at [facility], [place] [address] [place], [place] [address]    Gram Stain   Final    WBC SEEN RED BLOOD CELLS NO ORGANISMS SEEN Performed at [facility], [address], [place], [place] [address]    Culture   Final    NO GROWTH Performed at [facility], [place] [address], [place], [place] [address]    Report Status 08/31/2020 FINAL  Final  Culture, fungus without smear     Status: None (Preliminary result)   Collection Time: 08/28/20 12:01 PM   Specimen: PATH Cytology CSF; Cerebrospinal Fluid  Result Value Ref Range Status   Specimen Description   Final    CSF Performed at [facility], [address] [place], [place] [address]    Special Requests  NONE  Final   Culture   Final    NO FUNGUS ISOLATED AFTER 3 DAYS Performed at [facility], [place] [address] [place], [place] [address]    Report Status PENDING  Incomplete     Radiology Studies: EEG  Result Date: 08/30/2020 Lora Havens, MD     08/30/2020  4:01 PM Patient Name: MANUELITA MOXON MRN: [id number] Epilepsy Attending: Lora Havens Referring Physician/Provider: Dr Malvin Johns Date: 08/30/2020 Duration: 33.37  mins  Patient history: 60yo F with ams. EEG to evaluate for seizure  Level of alertness: Awake  AEDs during EEG study: LTG, LRZ  Technical  aspects: This EEG study was done with scalp electrodes positioned according to the 10-20 International system of electrode placement. Electrical activity was acquired at a sampling rate of 500Hz  and reviewed with a high frequency filter of 70Hz  and a low frequency filter of 1Hz . EEG data were recorded continuously and digitally stored.  Description: No posterior dominant rhythm was seen. EEG showed continuous generalized 3 to 6 Hz theta-delta slowing.  Hyperventilation and photic stimulation were not performed.    ABNORMALITY -Continuous slow, generalized  IMPRESSION: This study is suggestive of moderate diffuse encephalopathy, nonspecific etiology. No seizures or epileptiform discharges were seen throughout the recording.  Priyanka Barbra Sarks    Scheduled Meds: . carbidopa-levodopa  1 tablet Oral Daily  . carbidopa-levodopa  2 tablet Oral BID  . Carbidopa-Levodopa ER  1 tablet Oral TID  . Carbidopa-Levodopa ER  2 tablet Oral QHS  . Chlorhexidine Gluconate Cloth  6 each Topical Q0600  . Chlorhexidine Gluconate Cloth  6 each Topical Q0600  . vitamin B-12  1,000 mcg Oral Daily   Or  . cyanocobalamin  1,000 mcg Intramuscular Daily  . heparin injection (subcutaneous)  5,000 Units Subcutaneous Q8H  . hydrALAZINE  25 mg Oral Q8H  . Ipratropium-Albuterol  1 puff Inhalation Q6H  . lamoTRIgine  25 mg Oral BID  . propranolol ER  60 mg Oral Daily  . [START ON 09/04/2020] thiamine injection  100 mg Intravenous Daily   Continuous Infusions: . thiamine injection 250 mg (09/01/20 1006)     LOS: 6 days   Time spent : 20 minutes.  Max Sane, MD [facility]  If 7PM-7AM, please contact night-coverage [email or web address]  09/01/2020, 3:49 PM   This record has been created using Systems analyst. Errors have been sought and corrected,but may not always be located. Such creation errors do not reflect on the standard of care.

## 2020-09-01 NOTE — Progress Notes (Signed)
Clinical/Bedside Swallow Evaluation Patient Details  Name: Stacy Pearson MRN: [id number] Date of Birth: February 24, 1961  Today's Date: 09/01/2020 Time: SLP Start Time (ACUTE ONLY): 6767 SLP Stop Time (ACUTE ONLY): 0900 SLP Time Calculation (min) (ACUTE ONLY): 28 min  Past Medical History:  Past Medical History:  Diagnosis Date   Abdominal pain    Allergic genetic state    Anxiety    Asthma    Basal cell carcinoma    Bipolar disorder ([place])    Cancer ([place])    MOS surgery basal cell face   COPD (chronic obstructive pulmonary disease) (HCC)    Depression    Eating disorder    Eczema    Encounter for blood transfusion    Essential tremor    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[place])    GERD (gastroesophageal reflux disease)    Headache    History of basal cell carcinoma    Hypertension    Neuropathy    Parkinson's disease ([place])    Parkinson's disease ([place])    Parkinsonism ([place])    Past Surgical History:  Past Surgical History:  Procedure Laterality Date   ABDOMINAL HYSTERECTOMY     ADENOIDECTOMY     BLADDER SURGERY     bone grafts     CATARACT EXTRACTION W/ INTRAOCULAR LENS IMPLANT     CERVICAL FUSION     COLONOSCOPY     COLONOSCOPY WITH ESOPHAGOGASTRODUODENOSCOPY (EGD)     COLONOSCOPY WITH PROPOFOL N/A 04/28/2018   Procedure: COLONOSCOPY WITH PROPOFOL;  Surgeon: Manya Silvas, MD;  Location: [facility] ENDOSCOPY;  Service: Endoscopy;  Laterality: N/A;   EYE SURGERY     GRAFT APPLICATION     in neck   LAPAROSCOPIC BILATERAL SALPINGO OOPHERECTOMY     NEUROPLASTY / TRANSPOSITION ULNAR NERVE AT ELBOW     OOPHORECTOMY     SKIN CANCER EXCISION     skin grafts   left forearm; hand bil.lower extremeties   titanium plate     TONSILLECTOMY     TUBAL LIGATION     HPI:  Stacy KUYPER is a 60 y.o. female with history of Parkinson's, bipolar disorder, COPD, admitted with somnolence, confusion, agitation and lethargy. Found to have sepsis with  unclear/unknown source. MRI 2/17 showed no acute findings, CXR negative for infiltrate.   Assessment / Plan / Recommendation Clinical Impression  Patient presents with mild risk for aspiration, due to altered (but improving) mental status, Parkinson's disease, and possible history of dysphagia. Patient is oriented to self and location, not situation. She is able to give some history, such as working with a speech therapist in the past on clearer speech, and "choking." No history of swallow evaluations or dysphagia found in chart review, including Care Everywhere. Oral motor examination is noted for mild lingual weakness, decreased ROM bilaterally. She reports she has dentures and typically wears them to eat, but they are not available. She was unable to hold a cup or lift her hand to self-feed. Pt tolerated ice chips, spoon, cup and straw sips of thin liquids with appearance of adequate oral containment and oral transfer, timely swallow initiation, and good airway protection. Vocal quality remained loud and clear, vital signs stable with HR 90, RR 18-25, and Sp02 95+. Accepted spoon bites of puree, with functional manipulation, adequate oral clearance, no overt signs of aspiration. With soft solid, pt initiated minimal attempts at mastication, there is absence of rotary chew, and she appeared to swallow bites of graham cracker in puree  mostly whole. Recommend initiating dysphagia 1, thin liquids by cup or straw, and meds whole in puree. She will need full supervision/assist as she is unable to self-feed at this time. Aspiration precautions: position upright, feed only when alert, slow rate, small bites and sips. SLP to follow for tolerance, advancement as appropriate.   SLP Visit Diagnosis: Dysphagia, oropharyngeal phase (R13.12)    Aspiration Risk  Mild aspiration risk    Diet Recommendation Dysphagia 1 (Puree);Thin liquid   Liquid Administration via: Cup;Straw Medication Administration: Whole meds  with puree Supervision: Staff to assist with self feeding;Full supervision/cueing for compensatory strategies (unable to self-feed) Compensations: Minimize environmental distractions;Slow rate;Small sips/bites Postural Changes: Seated upright at 90 degrees    Other  Recommendations Oral Care Recommendations: Oral care BID   Follow up Recommendations Other (comment) (tbd)      Frequency and Duration min 2x/week  2 weeks       Prognosis Prognosis for Safe Diet Advancement: Good Barriers to Reach Goals: Cognitive deficits      Swallow Study   General Date of Onset: 08/26/20 HPI: Stacy Pearson is a 60 y.o. female with history of Parkinson's, bipolar disorder, COPD, admitted with somnolence, confusion, agitation and lethargy. Found to have sepsis with unclear/unknown source. MRI 2/17 showed no acute findings, CXR negative for infiltrate. Type of Study: Bedside Swallow Evaluation Previous Swallow Assessment: none on file Diet Prior to this Study: NPO Temperature Spikes Noted: No Respiratory Status: Room air History of Recent Intubation: No Behavior/Cognition: Alert;Cooperative Oral Cavity Assessment: Within Functional Limits Oral Care Completed by SLP: Yes Oral Cavity - Dentition: Dentures, not available Vision: Impaired for self-feeding Self-Feeding Abilities: Total assist (could not lift hand or hold cup) Patient Positioning: Upright in bed Baseline Vocal Quality: Normal Volitional Cough: Strong Volitional Swallow: Able to elicit    Oral/Motor/Sensory Function Overall Oral Motor/Sensory Function: Mild impairment Facial ROM: Within Functional Limits Lingual ROM: Reduced right;Reduced left Lingual Symmetry: Within Functional Limits Lingual Strength: Reduced Velum: Within Functional Limits Mandible: Within Functional Limits   Ice Chips Ice chips: Within functional limits   Thin Liquid Thin Liquid: Within functional limits Presentation: Cup;Straw    Nectar Thick Nectar  Thick Liquid: Not tested   Honey Thick Honey Thick Liquid: Not tested   Puree Puree: Within functional limits Presentation: Spoon   Solid     Solid: Impaired Presentation: Spoon (graham cracker in applesauce) Oral Phase Impairments: Reduced lingual movement/coordination;Impaired mastication Oral Phase Functional Implications: Prolonged oral transit;Impaired mastication Pharyngeal Phase Impairments: Other (comments) (none)     Deneise Lever, MS, [place]  Aliene Altes 09/01/2020,9:05 AM

## 2020-09-01 NOTE — Progress Notes (Signed)
[place], [place] 09/01/20  Subjective:  Stacy Pearson is a 60 y.o. white female with  bipolar, depression, anxiety, parkinson's, epilepsy, HTN, and COPD. She presents to the ED with AMS. Concern for overdose (cariprazine+alprazolam) versus urinary tract infection with sepsis.   Acute dialysis while inpatient  has completed 3 treatments  Doing well today Denies any c/o   Objective:  Vital signs in last 24 hours:  Pulse Rate:  [89-101] 91 (02/19 0600) Resp:  [19-27] 22 (02/19 0600) BP: (157-169)/(90-97) 167/93 (02/19 0600) SpO2:  [94 %-100 %] 96 % (02/19 0600)  Weight change:  Filed Weights   08/27/20 0100 08/28/20 0245 08/29/20 0337  Weight: 64.1 kg 66.8 kg 67.1 kg    Intake/Output:    Intake/Output Summary (Last 24 hours) at 09/01/2020 2725 Last data filed at 09/01/2020 0600 Gross per 24 hour  Intake --  Output 1270 ml  Net -1270 ml    Physical Exam: General: Ill appearing, laying in bed  Eyes: Anicteric  Lungs:  Clear to auscultation  Heart: Regular rate and rhythm  Abdomen:  Soft, nontender,   Extremities:  trace peripheral edema.  Neurologic:  following commands, interactive  Skin: No lesions  Access: Right femoral temp HD catheter 2/16  Foley in place  Basic Metabolic Panel:  Recent Labs  Lab 08/28/20 0902 08/29/20 1018 08/30/20 1000 08/31/20 0911 09/01/20 0531  NA 144 143 143 141 139  K 3.9 4.2 4.1 4.1 3.9  CL 105 104 101 100 97*  CO2 23 20* 22 20* 22  GLUCOSE 120* 94 80 72 77  BUN 87* 96* 77* 50* 36*  CREATININE 6.92* 8.43* 7.49* 6.00* 4.49*  CALCIUM 8.5* 8.7* 8.8* 8.9 8.9  MG  --   --   --   --  2.2  PHOS 5.8* 7.3* 7.7* 7.0* 6.9*     CBC: Recent Labs  Lab 08/26/20 0508 08/27/20 0643 08/28/20 0902 08/29/20 1018 08/30/20 1000 08/31/20 0911 09/01/20 0531  WBC 18.3*   < > 7.8 7.7 6.3 7.8 7.5  NEUTROABS 16.7*  --   --   --   --   --   --   HGB 14.9   < > 9.8* 10.0* 9.7* 10.1* 9.7*  HCT 46.0   < >  29.6* 29.9* 28.9* 31.0* 29.4*  MCV 94.7   < > 91.4 91.2 92.0 92.5 91.6  PLT 475*   < > 268 285 249 241 237   < > = values in this interval not displayed.      Lab Results  Component Value Date   HEPBSAG NON REACTIVE 08/29/2020   HEPBSAB NON REACTIVE 08/29/2020   HEPBIGM NON REACTIVE 08/29/2020      Microbiology:  Recent Results (from the past 240 hour(s))  Resp Panel by RT-PCR (Flu A&B, Covid) Urine, Clean Catch     Status: None   Collection Time: 08/26/20  5:08 AM   Specimen: Urine, Clean Catch; Nasopharyngeal(NP) swabs in vial transport medium  Result Value Ref Range Status   SARS Coronavirus 2 by RT PCR NEGATIVE NEGATIVE Final    Comment: (NOTE) SARS-CoV-2 target nucleic acids are NOT DETECTED.  The SARS-CoV-2 RNA is generally detectable in upper respiratory specimens during the acute phase of infection. The lowest concentration of SARS-CoV-2 viral copies this assay can detect is 138 copies/mL. A negative result does not preclude SARS-Cov-2 infection and should not be used as the sole basis for treatment or other patient management decisions. A negative  result may occur with  improper specimen collection/handling, submission of specimen other than nasopharyngeal swab, presence of viral mutation(s) within the areas targeted by this assay, and inadequate number of viral copies(<138 copies/mL). A negative result must be combined with clinical observations, patient history, and epidemiological information. The expected result is Negative.  Fact Sheet for Patients:  EntrepreneurPulse.com.au  Fact Sheet for Healthcare Providers:  IncredibleEmployment.be  This test is no t yet approved or cleared by the [place] FDA and  has been authorized for detection and/or diagnosis of SARS-CoV-2 by FDA under an Emergency Use Authorization (EUA). This EUA will remain  in effect (meaning this test can be used) for the duration of the COVID-19  declaration under Section 564(b)(1) of the Act, 21 U.S.C.section 360bbb-3(b)(1), unless the authorization is terminated  or revoked sooner.       Influenza A by PCR NEGATIVE NEGATIVE Final   Influenza B by PCR NEGATIVE NEGATIVE Final    Comment: (NOTE) The Xpert Xpress SARS-CoV-2/FLU/RSV plus assay is intended as an aid in the diagnosis of influenza from Nasopharyngeal swab specimens and should not be used as a sole basis for treatment. Nasal washings and aspirates are unacceptable for Xpert Xpress SARS-CoV-2/FLU/RSV testing.  Fact Sheet for Patients: EntrepreneurPulse.com.au  Fact Sheet for Healthcare Providers: IncredibleEmployment.be  This test is not yet approved or cleared by the [place] FDA and has been authorized for detection and/or diagnosis of SARS-CoV-2 by FDA under an Emergency Use Authorization (EUA). This EUA will remain in effect (meaning this test can be used) for the duration of the COVID-19 declaration under Section 564(b)(1) of the Act, 21 U.S.C. section 360bbb-3(b)(1), unless the authorization is terminated or revoked.  Performed at [facility], [address] [place], [place] [address]   Urine culture     Status: None   Collection Time: 08/26/20  5:54 AM   Specimen: Urine, Clean Catch  Result Value Ref Range Status   Specimen Description   Final    URINE, CLEAN CATCH Performed at [facility], [address] [place], [place] [address]    Special Requests   Final    NONE Performed at [facility], [address], [place], [place] [address]    Culture   Final    NO GROWTH Performed at [facility], [place] [address], [place], [place] [address]    Report Status 08/27/2020 FINAL  Final  Culture, blood (routine x 2)     Status: None   Collection Time: 08/26/20  6:03 AM   Specimen: Left Antecubital; Blood  Result Value Ref Range Status   Specimen Description LEFT  ANTECUBITAL  Final   Special Requests   Final    BOTTLES DRAWN AEROBIC AND ANAEROBIC Blood Culture adequate volume   Culture   Final    NO GROWTH 5 DAYS Performed at [facility], [address] [place], [place] [address]    Report Status 08/31/2020 FINAL  Final  Culture, blood (routine x 2)     Status: None   Collection Time: 08/26/20  8:55 AM   Specimen: BLOOD LEFT FOREARM  Result Value Ref Range Status   Specimen Description BLOOD LEFT FOREARM  Final   Special Requests   Final    BOTTLES DRAWN AEROBIC AND ANAEROBIC Blood Culture adequate volume   Culture   Final    NO GROWTH 5 DAYS Performed at [facility], [address], [place], [place] [address]    Report Status  08/31/2020 FINAL  Final  CSF culture w Gram Stain     Status: None   Collection Time: 08/28/20 12:01 PM   Specimen: PATH Cytology CSF; Cerebrospinal Fluid  Result Value Ref Range Status   Specimen Description   Final    CSF Performed at [facility], [address], [place], [place] [address]    Special Requests   Final    NONE Performed at [facility], [place] [address], [place], [place] [address]    Gram Stain   Final    WBC SEEN RED BLOOD CELLS NO ORGANISMS SEEN Performed at [facility], [address] [place], [place] [address]    Culture   Final    NO GROWTH Performed at [facility], [place] [address], [place], [place] [address]    Report Status 08/31/2020 FINAL  Final  Culture, fungus without smear     Status: None (Preliminary result)   Collection Time: 08/28/20 12:01 PM   Specimen: PATH Cytology CSF; Cerebrospinal Fluid  Result Value Ref Range Status   Specimen Description   Final    CSF Performed at [facility], [address] [place], [place] [address]    Special Requests NONE  Final   Culture   Final    NO FUNGUS ISOLATED AFTER 3 DAYS Performed at [facility], [place] [address], [place],  [address]    Report  Status PENDING  Incomplete    Coagulation Studies: No results for input(s): LABPROT, INR in the last 72 hours.  Urinalysis: No results for input(s): COLORURINE, LABSPEC, PHURINE, GLUCOSEU, HGBUR, BILIRUBINUR, KETONESUR, PROTEINUR, UROBILINOGEN, NITRITE, LEUKOCYTESUR in the last 72 hours.  Invalid input(s): APPERANCEUR    Imaging: EEG  Result Date: 08/30/2020 Lora Havens, MD     08/30/2020  4:01 PM Patient Name: Stacy Pearson MRN: [id number] Epilepsy Attending: Lora Havens Referring Physician/Provider: Dr Malvin Johns Date: 08/30/2020 Duration: 33.37  mins  Patient history: 60yo F with ams. EEG to evaluate for seizure  Level of alertness: Awake  AEDs during EEG study: LTG, LRZ  Technical aspects: This EEG study was done with scalp electrodes positioned according to the 10-20 International system of electrode placement. Electrical activity was acquired at a sampling rate of 500Hz  and reviewed with a high frequency filter of 70Hz  and a low frequency filter of 1Hz . EEG data were recorded continuously and digitally stored.  Description: No posterior dominant rhythm was seen. EEG showed continuous generalized 3 to 6 Hz theta-delta slowing.  Hyperventilation and photic stimulation were not performed.    ABNORMALITY -Continuous slow, generalized  IMPRESSION: This study is suggestive of moderate diffuse encephalopathy, nonspecific etiology. No seizures or epileptiform discharges were seen throughout the recording.  Priyanka Barbra Sarks     Medications:   . thiamine injection Stopped (08/30/20 1630)   . carbidopa-levodopa  1 tablet Oral Daily  . carbidopa-levodopa  2 tablet Oral BID  . Carbidopa-Levodopa ER  1 tablet Oral TID  . Carbidopa-Levodopa ER  2 tablet Oral QHS  . Chlorhexidine Gluconate Cloth  6 each Topical Q0600  . Chlorhexidine Gluconate Cloth  6 each Topical Q0600  . vitamin B-12  1,000 mcg Oral Daily   Or  . cyanocobalamin  1,000 mcg Intramuscular Daily  .  heparin injection (subcutaneous)  5,000 Units Subcutaneous Q8H  . hydrALAZINE  25 mg Oral Q8H  . Ipratropium-Albuterol  1 puff Inhalation Q6H  . lamoTRIgine  25 mg Oral BID  .  propranolol ER  60 mg Oral Daily  . [START ON 09/04/2020] thiamine injection  100 mg Intravenous Daily   acetaminophen **OR** acetaminophen, albuterol, hydrALAZINE, LORazepam, ondansetron (ZOFRAN) IV  Assessment/ Plan:  60 y.o. female with PMHX of bipolar, depression, anxiety, parkinson's, epilepsy, HTN, and COPD was admitted on 08/26/2020   Principal Problem:   Delirium Active Problems:   Bipolar 1 disorder (HCC)   COPD (chronic obstructive pulmonary disease) (HCC)   Essential tremor   Migraine without aura   Parkinson's disease ([place])   Epilepsy ([place])   Depression with anxiety   HTN (hypertension)   Acute metabolic encephalopathy   UTI (urinary tract infection)   AKI (acute kidney injury) ([place])   Severe sepsis with septic shock (HCC)  Confusion [R41.0] Severe sepsis with septic shock ([place]) [A41.9, R65.21] Severe sepsis ([place]) [A41.9, R65.20] Altered mental status, unspecified altered mental status type [R41.82] Sepsis, due to unspecified organism, unspecified whether acute organ dysfunction present ([place]) [A41.9]  #. Acute kidney injury with hyperkalemia and metabolic acidosis: baseline creatinine 0.8 with normal GFR on 01/31/2020 - monitor volume status.   02/18 0701 - 02/19 0700 In: 115 [IV Piggyback:115] Out: 11 [Urine:270]   Lab Results  Component Value Date   CREATININE 4.49 (H) 09/01/2020   CREATININE 6.00 (H) 08/31/2020   CREATININE 7.49 (H) 08/30/2020   Electrolytes and Volume status are acceptable No acute indication for Dialysis at present  Will continue to monitor daily If she will continue to require treatments, will need perncath    # Anion gap metabolic acidosis: Salicylate levels negative, acetaminophen levels negative. Normal glucose. Lactic acidosis has improved.  Most likely  due to uremia/AKI    # Hypernatremia: secondary to free water deficit.  - improved with dialysis   LOS: [place] 2/19/20229:26 AM  [place], [place]

## 2020-09-02 DIAGNOSIS — G9341 Metabolic encephalopathy: Secondary | ICD-10-CM | POA: Diagnosis not present

## 2020-09-02 DIAGNOSIS — N179 Acute kidney failure, unspecified: Secondary | ICD-10-CM | POA: Diagnosis not present

## 2020-09-02 DIAGNOSIS — F319 Bipolar disorder, unspecified: Secondary | ICD-10-CM | POA: Diagnosis not present

## 2020-09-02 LAB — BASIC METABOLIC PANEL
Anion gap: 15 (ref 5–15)
BUN: 54 mg/dL — ABNORMAL HIGH (ref 6–20)
CO2: 24 mmol/L (ref 22–32)
Calcium: 9 mg/dL (ref 8.9–10.3)
Chloride: 98 mmol/L (ref 98–111)
Creatinine, Ser: 6.31 mg/dL — ABNORMAL HIGH (ref 0.44–1.00)
GFR, Estimated: 7 mL/min — ABNORMAL LOW (ref >60)
Glucose, Bld: 114 mg/dL — ABNORMAL HIGH (ref 70–99)
Potassium: 3.9 mmol/L (ref 3.5–5.1)
Sodium: 137 mmol/L (ref 135–145)

## 2020-09-02 LAB — CBC
HCT: 33.4 % — ABNORMAL LOW (ref 36.0–46.0)
Hemoglobin: 11.3 g/dL — ABNORMAL LOW (ref 12.0–15.0)
MCH: 30.5 pg (ref 26.0–34.0)
MCHC: 33.8 g/dL (ref 30.0–36.0)
MCV: 90.3 fL (ref 80.0–100.0)
Platelets: 267 10*3/uL (ref 150–400)
RBC: 3.7 MIL/uL — ABNORMAL LOW (ref 3.87–5.11)
RDW: 13.5 % (ref 11.5–15.5)
WBC: 6.8 10*3/uL (ref 4.0–10.5)
nRBC: 0 % (ref 0.0–0.2)

## 2020-09-02 NOTE — Progress Notes (Signed)
PROGRESS NOTE    Stacy Pearson  LOV:[id number] DOB: Dec 02, 1960 DOA: 08/26/2020 PCP: Valera Castle, MD   Brief Narrative: Taken from H&P. Stacy Pearson is a 60 y.o. female with medical history significant of bipolar, depression, anxiety, Parkinson, epilepsy, HTN, COPD, presents with AMS. On presentation patient was confused and agitated.  Admitting provider was able to contact her mother who is the only contact listed, and according to her she lives alone and was at her normal state of health until Friday afternoon. She was sleeping most of the time since then and found to be more confused and somnolent and EMS was called. Mother was not aware of any recent illnesses. On presentation she was found to have neutrophilic predominant leukocytosis, lactic acidosis at 6.7, Covid PCR negative, AKI, tachycardic and tachypneic.  Chest x-ray with low lung volumes and no infiltrate.  CT head was negative.  Lactic acidosis started improving, worsening renal function and hyperkalemia next morning.  Procalcitonin elevated at 1.77.  Blood and urine cultures negative.  Remained very altered, unable to provide any history. Worsening renal function, nephrology started emergent HD on 2/16 after transfer to ICU/SD Neurology was consulted with persistent altered mental status, LP was done and CSF not c/w infectious etio, on empiric rocephin   Subjective: She is very sleepy today and would not wake up for me.  No new complaints reported by nursing overnight  Assessment & Plan:   Principal Problem:   Delirium Active Problems:   Bipolar 1 disorder (HCC)   COPD (chronic obstructive pulmonary disease) (HCC)   Essential tremor   Migraine without aura   Parkinson's disease ([place])   Epilepsy ([place])   Depression with anxiety   HTN (hypertension)   Acute metabolic encephalopathy   UTI (urinary tract infection)   AKI (acute kidney injury) ([place])   Severe sepsis with septic shock (HCC)  Acute  Metabolic encephalopathy   Present on admission due to polypharmacy and possible overdose Significant improvement in her mental status with dialysis Antibiotics discontinued per ID as urine, blood and CSF culture remain negative  AKI -requiring dialysis per nephrology.  Nephrology planning to get permacath in next week for ongoing dialysis need  Acute hypoxic respiratory failure Now resolved, weaned off BiPAP and on nasal cannula oxygen.  Hyperkalemia.  Resolved  Most likely secondary to worsening renal function.  Concern of renal tubular acidosis.  Anion gap metabolic acidosis.  Most likely secondary to lactic acidosis. Elevated CK so rhabdomyolysis can be playing a role. -Improving with HD  Bipolar 1 disorder with depression and anxiety.  For now I am holding Ativan.  Please order if need we are trying to avoid any sedating medication to keep her awake  COPD.  Appears stable, no wheezing and chest x-ray without any acute abnormality. -As needed bronchodilators  History of Parkinson's disease.  Unknown baseline -Continue with home dose of Sinemet. -Continue with propranolol-do not know whether tremors are essential or secondary to underlying Parkinson's.  History of migraine. -Continue as needed Tylenol  History of seizure disorder  Seizure ruled out, EEG negative for any epileptiform waves Continue Lamictal  Hypertension.  Blood pressure running high On home propranolol. -Continue hydralazine to 25 mg p.o. every 6 hours and continue IV as needed  Generalized weakness PT, OT recommends CIR.  TOC aware   Objective: Vitals:   09/01/20 1942 09/01/20 2353 09/02/20 0449 09/02/20 1221  BP: (!) 165/89 (!) 179/96 (!) 168/84 (!) 168/96  Pulse: 78 74 70 78  Resp: 16 18 19 16   Temp: 97.6 F (36.4 C) 98.3 F (36.8 C) 98.5 F (36.9 C) 98.5 F (36.9 C)  TempSrc: Oral Oral Oral Oral  SpO2: 94% 94% 93% 94%  Weight:      Height:        Intake/Output Summary (Last 24 hours)  at 09/02/2020 1448 Last data filed at 09/01/2020 1800 Gross per 24 hour  Intake 120 ml  Output 200 ml  Net -80 ml   Filed Weights   08/27/20 0100 08/28/20 0245 08/29/20 0337  Weight: 64.1 kg 66.8 kg 67.1 kg    Examination:  General.  Chronically ill-appearing lady, in no acute distress, sleepy today Pulmonary.  Lungs clear bilaterally, normal respiratory effort.  CV.  Regular rate and rhythm, no JVD, rub or murmur. Abdomen.  Soft, nontender, nondistended, BS positive. CNS.  Sleepy today extremities.  No edema, no cyanosis, pulses intact and symmetrical. Psychiatry.  Difficult to evaluate due to her mental status  DVT prophylaxis: Lovenox Code Status: DNR-admitting provider confirmed with mother Family Communication: None Disposition Plan: unknown Status is: Inpatient    Remains inpatient appropriate because:Inpatient level of care appropriate due to severity of illness   Dispo: The patient is from: Home              Anticipated d/c is to: CIR              Anticipated d/c date is: > 3 days              Patient currently is not medically stable to d/c.     Difficult to place patient Yes              Level of care: Med-Surg  Consultants:   Psychiatry  Nephrology  Neurology  ID  Procedures:  Antimicrobials:  None  Data Reviewed: I have personally reviewed following labs and imaging studies  CBC: Recent Labs  Lab 08/29/20 1018 08/30/20 1000 08/31/20 0911 09/01/20 0531 09/02/20 0438  WBC 7.7 6.3 7.8 7.5 6.8  HGB 10.0* 9.7* 10.1* 9.7* 11.3*  HCT 29.9* 28.9* 31.0* 29.4* 33.4*  MCV 91.2 92.0 92.5 91.6 90.3  PLT 285 249 241 237 175   Basic Metabolic Panel: Recent Labs  Lab 08/28/20 0902 08/29/20 1018 08/30/20 1000 08/31/20 0911 09/01/20 0531 09/02/20 0438  NA 144 143 143 141 139 137  K 3.9 4.2 4.1 4.1 3.9 3.9  CL 105 104 101 100 97* 98  CO2 23 20* 22 20* 22 24  GLUCOSE 120* 94 80 72 77 114*  BUN 87* 96* 77* 50* 36* 54*  CREATININE 6.92* 8.43*  7.49* 6.00* 4.49* 6.31*  CALCIUM 8.5* 8.7* 8.8* 8.9 8.9 9.0  MG  --   --   --   --  2.2  --   PHOS 5.8* 7.3* 7.7* 7.0* 6.9*  --    GFR: Estimated Creatinine Clearance: 9.3 mL/min (A) (by C-G formula based on SCr of 6.31 mg/dL (H)). Liver Function Tests: Recent Labs  Lab 08/28/20 0902 08/29/20 1018 08/30/20 1000 08/31/20 0911  AST  --   --  20  --   ALT  --   --  <5  --   ALKPHOS  --   --  75  --   BILITOT  --   --  0.6  --   PROT  --   --  6.5  --   ALBUMIN 2.9* 2.9* 2.7*  2.7* 2.7*   No results for input(s): LIPASE,  AMYLASE in the last 168 hours. Recent Labs  Lab 08/27/20 1557  AMMONIA 28   Coagulation Profile: Recent Labs  Lab 08/28/20 0902  INR 1.1   Cardiac Enzymes: Recent Labs  Lab 08/27/20 0911 08/29/20 1018  CKTOTAL 364* 183   BNP (last 3 results) No results for input(s): PROBNP in the last 8760 hours. HbA1C: No results for input(s): HGBA1C in the last 72 hours. CBG: Recent Labs  Lab 08/28/20 1449 08/29/20 1152 08/30/20 1127  GLUCAP 96 83 95   Lipid Profile: No results for input(s): CHOL, HDL, LDLCALC, TRIG, CHOLHDL, LDLDIRECT in the last 72 hours. Thyroid Function Tests: No results for input(s): TSH, T4TOTAL, FREET4, T3FREE, THYROIDAB in the last 72 hours. Anemia Panel: No results for input(s): VITAMINB12, FOLATE, FERRITIN, TIBC, IRON, RETICCTPCT in the last 72 hours. Sepsis Labs: Recent Labs  Lab 08/26/20 1451 08/27/20 1235 08/27/20 1510  LATICACIDVEN 2.8* 3.0* 2.1*    Recent Results (from the past 240 hour(s))  Resp Panel by RT-PCR (Flu A&B, Covid) Urine, Clean Catch     Status: None   Collection Time: 08/26/20  5:08 AM   Specimen: Urine, Clean Catch; Nasopharyngeal(NP) swabs in vial transport medium  Result Value Ref Range Status   SARS Coronavirus 2 by RT PCR NEGATIVE NEGATIVE Final    Comment: (NOTE) SARS-CoV-2 target nucleic acids are NOT DETECTED.  The SARS-CoV-2 RNA is generally detectable in upper respiratory specimens  during the acute phase of infection. The lowest concentration of SARS-CoV-2 viral copies this assay can detect is 138 copies/mL. A negative result does not preclude SARS-Cov-2 infection and should not be used as the sole basis for treatment or other patient management decisions. A negative result may occur with  improper specimen collection/handling, submission of specimen other than nasopharyngeal swab, presence of viral mutation(s) within the areas targeted by this assay, and inadequate number of viral copies(<138 copies/mL). A negative result must be combined with clinical observations, patient history, and epidemiological information. The expected result is Negative.  Fact Sheet for Patients:  EntrepreneurPulse.com.au  Fact Sheet for Healthcare Providers:  IncredibleEmployment.be  This test is no t yet approved or cleared by the [place] FDA and  has been authorized for detection and/or diagnosis of SARS-CoV-2 by FDA under an Emergency Use Authorization (EUA). This EUA will remain  in effect (meaning this test can be used) for the duration of the COVID-19 declaration under Section 564(b)(1) of the Act, 21 U.S.C.section 360bbb-3(b)(1), unless the authorization is terminated  or revoked sooner.       Influenza A by PCR NEGATIVE NEGATIVE Final   Influenza B by PCR NEGATIVE NEGATIVE Final    Comment: (NOTE) The Xpert Xpress SARS-CoV-2/FLU/RSV plus assay is intended as an aid in the diagnosis of influenza from Nasopharyngeal swab specimens and should not be used as a sole basis for treatment. Nasal washings and aspirates are unacceptable for Xpert Xpress SARS-CoV-2/FLU/RSV testing.  Fact Sheet for Patients: EntrepreneurPulse.com.au  Fact Sheet for Healthcare Providers: IncredibleEmployment.be  This test is not yet approved or cleared by the [place] FDA and has been authorized for detection  and/or diagnosis of SARS-CoV-2 by FDA under an Emergency Use Authorization (EUA). This EUA will remain in effect (meaning this test can be used) for the duration of the COVID-19 declaration under Section 564(b)(1) of the Act, 21 U.S.C. section 360bbb-3(b)(1), unless the authorization is terminated or revoked.  Performed at [facility], [address], [place], [place] [address]   Urine culture  Status: None   Collection Time: 08/26/20  5:54 AM   Specimen: Urine, Clean Catch  Result Value Ref Range Status   Specimen Description   Final    URINE, CLEAN CATCH Performed at [facility], [address], [place], [place] [address]    Special Requests   Final    NONE Performed at [facility], [address] [place], [place] [address]    Culture   Final    NO GROWTH Performed at [facility], [place] [address], [place], [place] [address]    Report Status 08/27/2020 FINAL  Final  Culture, blood (routine x 2)     Status: None   Collection Time: 08/26/20  6:03 AM   Specimen: Left Antecubital; Blood  Result Value Ref Range Status   Specimen Description LEFT ANTECUBITAL  Final   Special Requests   Final    BOTTLES DRAWN AEROBIC AND ANAEROBIC Blood Culture adequate volume   Culture   Final    NO GROWTH 5 DAYS Performed at [facility], [address], [place], [place] [address]    Report Status 08/31/2020 FINAL  Final  Culture, blood (routine x 2)     Status: None   Collection Time: 08/26/20  8:55 AM   Specimen: BLOOD LEFT FOREARM  Result Value Ref Range Status   Specimen Description BLOOD LEFT FOREARM  Final   Special Requests   Final    BOTTLES DRAWN AEROBIC AND ANAEROBIC Blood Culture adequate volume   Culture   Final    NO GROWTH 5 DAYS Performed at [facility], [address] [place], [place] [address]    Report Status 08/31/2020 FINAL  Final  CSF culture w Gram Stain     Status: None   Collection Time: 08/28/20  12:01 PM   Specimen: PATH Cytology CSF; Cerebrospinal Fluid  Result Value Ref Range Status   Specimen Description   Final    CSF Performed at [facility], [address] [place], [place] [address]    Special Requests   Final    NONE Performed at [facility], [place] [address], [place], [place] [address]    Gram Stain   Final    WBC SEEN RED BLOOD CELLS NO ORGANISMS SEEN Performed at [facility], [address], [place], [place] [address]    Culture   Final    NO GROWTH Performed at [facility], [place] [address], [place], [place] [address]    Report Status 08/31/2020 FINAL  Final  Culture, fungus without smear     Status: None (Preliminary result)   Collection Time: 08/28/20 12:01 PM   Specimen: PATH Cytology CSF; Cerebrospinal Fluid  Result Value Ref Range Status   Specimen Description   Final    CSF Performed at [facility], [address], [place], [place] [address]    Special Requests NONE  Final   Culture   Final    NO FUNGUS ISOLATED AFTER 5 DAYS Performed at [name], [place] [address], [place], [place] [address]    Report Status PENDING  Incomplete     Radiology Studies: No results found.  Scheduled Meds: . carbidopa-levodopa  1 tablet Oral Daily  . carbidopa-levodopa  2 tablet Oral BID  . Carbidopa-Levodopa ER  1 tablet Oral TID  . Carbidopa-Levodopa ER  2 tablet Oral QHS  . Chlorhexidine Gluconate Cloth  6 each Topical Q0600  . Chlorhexidine Gluconate Cloth  6 each Topical Q0600  . vitamin B-12  1,000 mcg Oral Daily   Or  . cyanocobalamin  1,000 mcg Intramuscular Daily  . heparin injection (subcutaneous)  5,000 Units Subcutaneous Q8H  . hydrALAZINE  25 mg Oral Q6H  . Ipratropium-Albuterol  1 puff Inhalation Q6H  . lamoTRIgine  25 mg Oral BID  . propranolol ER  60 mg Oral Daily  . [START ON 09/04/2020] thiamine injection  100 mg Intravenous Daily   Continuous Infusions: . thiamine injection 250 mg  (09/02/20 1416)     LOS: 7 days   Time spent : 20 minutes.  Max Sane, MD [facility]  If 7PM-7AM, please contact night-coverage [email or web address]  09/02/2020, 2:48 PM   This record has been created using Systems analyst. Errors have been sought and corrected,but may not always be located. Such creation errors do not reflect on the standard of care.

## 2020-09-02 NOTE — Consult Note (Signed)
[facility] Consult Note  MRN : [id number]  Stacy Pearson is a 60 y.o. (March 13, 1961) female who presents with chief complaint of  Chief Complaint  Patient presents with  . Altered Mental Status  .  History of Present Illness: Patient with depression, bipolar, Parkinsons- admitted with alerted mental status- acute kidney failure secondary to concern of  UTI with sepsis possible cariprazine/alprazolam overdose. Patient had temporary HD via Right groin temporary catheter. Now requiring long term HD. Patient lethargic upon exam.  Current Facility-Administered Medications  Medication Dose Route Frequency Provider Last Rate Last Admin  . acetaminophen (TYLENOL) tablet 650 mg  650 mg Oral Q6H PRN Ivor Costa, MD       Or  . acetaminophen (TYLENOL) suppository 650 mg  650 mg Rectal Q6H PRN Ivor Costa, MD      . albuterol (PROVENTIL) (2.5 MG/3ML) 0.083% nebulizer solution 2.5 mg  2.5 mg Nebulization Q4H PRN Ivor Costa, MD      . carbidopa-levodopa (SINEMET IR) 25-100 MG per tablet immediate release 1 tablet  1 tablet Oral Daily Lorella Nimrod, MD   1 tablet at 09/01/20 1812  . carbidopa-levodopa (SINEMET IR) 25-100 MG per tablet immediate release 2 tablet  2 tablet Oral BID Lorella Nimrod, MD   2 tablet at 09/02/20 1417  . Carbidopa-Levodopa ER (SINEMET CR) 25-100 MG tablet controlled release 1 tablet  1 tablet Oral TID Oswald Hillock, RPH   1 tablet at 09/02/20 1416  . Carbidopa-Levodopa ER (SINEMET CR) 25-100 MG tablet controlled release 2 tablet  2 tablet Oral QHS Oswald Hillock, RPH   2 tablet at 09/01/20 2351  . Chlorhexidine Gluconate Cloth 2 % PADS 6 each  6 each Topical Q0600 Lorella Nimrod, MD   6 each at 08/31/20 0601  . Chlorhexidine Gluconate Cloth 2 % PADS 6 each  6 each Topical Q0600 Lavonia Dana, MD   6 each at 09/02/20 [phone]  . vitamin B-12 (CYANOCOBALAMIN) tablet 1,000 mcg  1,000 mcg Oral Daily Donnetta Simpers, MD   1,000 mcg at 09/02/20 1037    Or  . cyanocobalamin ((VITAMIN B-12)) injection 1,000 mcg  1,000 mcg Intramuscular Daily Donnetta Simpers, MD   1,000 mcg at 08/31/20 1507  . heparin injection 5,000 Units  5,000 Units Subcutaneous Q8H Oswald Hillock, RPH   5,000 Units at 09/02/20 1418  . hydrALAZINE (APRESOLINE) injection 10 mg  10 mg Intravenous Q6H PRN Max Sane, MD      . hydrALAZINE (APRESOLINE) tablet 25 mg  25 mg Oral Q6H Manuella Ghazi, Vipul, MD   25 mg at 09/02/20 1417  . Ipratropium-Albuterol (COMBIVENT) respimat 1 puff  1 puff Inhalation Q6H Ivor Costa, MD   1 puff at 09/02/20 1419  . lamoTRIgine (LAMICTAL) tablet 25 mg  25 mg Oral BID Ivor Costa, MD   25 mg at 09/02/20 1037  . ondansetron (ZOFRAN) injection 4 mg  4 mg Intravenous Q8H PRN Ivor Costa, MD      . propranolol ER (INDERAL LA) 24 hr capsule 60 mg  60 mg Oral Daily Ivor Costa, MD   60 mg at 09/02/20 1037  . thiamine (B-1) 250 mg in sodium chloride 0.9 % 50 mL IVPB  250 mg Intravenous Daily Lorella Nimrod, MD   Stopped at 09/02/20 1503   Followed by  . [START ON 09/04/2020] thiamine (B-1) injection 100 mg  100 mg Intravenous Daily Lorella Nimrod, MD        Past Medical History:  Diagnosis  Date  . Abdominal pain   . Allergic genetic state   . Anxiety   . Asthma   . Basal cell carcinoma   . Bipolar disorder ([place])   . Cancer ([employer])    MOS surgery basal cell face  . COPD (chronic obstructive pulmonary disease) ([place])   . Depression   . Eating disorder   . Eczema   . Encounter for blood transfusion   . Essential tremor   . 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 GERD (gastroesophageal reflux disease)   . Headache   . History of basal cell carcinoma   . Hypertension   . Neuropathy   . Parkinson's disease ([place])   . Parkinson's disease ([place])   . Parkinsonism [facility])     Past Surgical History:  Procedure Laterality Date  . ABDOMINAL HYSTERECTOMY    . ADENOIDECTOMY    . BLADDER SURGERY    . bone grafts    . CATARACT  EXTRACTION W/ INTRAOCULAR LENS IMPLANT    . CERVICAL FUSION    . COLONOSCOPY    . COLONOSCOPY WITH ESOPHAGOGASTRODUODENOSCOPY (EGD)    . COLONOSCOPY WITH PROPOFOL N/A 04/28/2018   Procedure: COLONOSCOPY WITH PROPOFOL;  Surgeon: Manya Silvas, MD;  Location: [facility] ENDOSCOPY;  Service: Endoscopy;  Laterality: N/A;  . EYE SURGERY    . GRAFT APPLICATION     in neck  . LAPAROSCOPIC BILATERAL SALPINGO OOPHERECTOMY    . NEUROPLASTY / TRANSPOSITION ULNAR NERVE AT ELBOW    . OOPHORECTOMY    . SKIN CANCER EXCISION    . skin grafts   left forearm; hand bil.lower extremeties  . titanium plate    . TONSILLECTOMY    . TUBAL LIGATION      Social History Social History   Tobacco Use  . Smoking status: Former Smoker    Types: Cigarettes    Quit date: 07/14/2006    Years since quitting: 14.1  . Smokeless tobacco: Never Used  Substance Use Topics  . Alcohol use: Never  . Drug use: Never    Family History Family History  Problem Relation Age of Onset  . Breast cancer Mother   . Lung cancer Father     Allergies  Allergen Reactions  . Codeine Nausea And Vomiting  . Prednisone      REVIEW OF SYSTEMS (Negative unless checked) Patient Lethargic- Unable to Obtain ROS Constitutional: [] Weight loss  [] Fever  [] Chills Cardiac: [] Chest pain   [] Chest pressure   [] Palpitations   [] Shortness of breath when laying flat   [] Shortness of breath at rest   [] Shortness of breath with exertion. Vascular:  [] Pain in legs with walking   [] Pain in legs at rest   [] Pain in legs when laying flat   [] Claudication   [] Pain in feet when walking  [] Pain in feet at rest  [] Pain in feet when laying flat   [] History of DVT   [] Phlebitis   [] Swelling in legs   [] Varicose veins   [] Non-healing ulcers Pulmonary:   [] Uses home oxygen   [] Productive cough   [] Hemoptysis   [] Wheeze  [] COPD   [] Asthma Neurologic:  [] Dizziness  [] Blackouts   [] Seizures   [] History of stroke   [] History of TIA  [] Aphasia   [] Temporary  blindness   [] Dysphagia   [] Weakness or numbness in arms   [] Weakness or numbness in legs Musculoskeletal:  [] Arthritis   [] Joint swelling   [] Joint pain   [] Low back pain Hematologic:  [] Easy bruising  []   Easy bleeding   [] Hypercoagulable state   [] Anemic  [] Hepatitis Gastrointestinal:  [] Blood in stool   [] Vomiting blood  [] Gastroesophageal reflux/heartburn   [] Difficulty swallowing. Genitourinary:  [] Chronic kidney disease   [] Difficult urination  [] Frequent urination  [] Burning with urination   [] Blood in urine Skin:  [] Rashes   [] Ulcers   [] Wounds Psychological:  [] History of anxiety   []  History of major depression.  Physical Examination  Vitals:   09/01/20 1942 09/01/20 2353 09/02/20 0449 09/02/20 1221  BP: (!) 165/89 (!) 179/96 (!) 168/84 (!) 168/96  Pulse: 78 74 70 78  Resp: 16 18 19 16   Temp: 97.6 F (36.4 C) 98.3 F (36.8 C) 98.5 F (36.9 C) 98.5 F (36.9 C)  TempSrc: Oral Oral Oral Oral  SpO2: 94% 94% 93% 94%  Weight:      Height:       Body mass index is 23.17 kg/m. Gen:  WD/WN, NAD Head: [place]/AT, No temporalis wasting. Prominent temp pulse not noted. Neck: Trachea midline.  No JVD.  Pulmonary:  Good air movement, respirations not labored, equal bilaterally.  Cardiac: RRR, normal S1, S2. Vascular:  Palpable radial- bilaterally, extremities warm, RIGHT groin femoral catheter in place Gastrointestinal: soft, non-tender/non-distended. No guarding/reflex.  Musculoskeletal: M/S 5/5 throughout.  Extremities without ischemic changes.  No deformity or atrophy. No edema.       CBC Lab Results  Component Value Date   WBC 6.8 09/02/2020   HGB 11.3 (L) 09/02/2020   HCT 33.4 (L) 09/02/2020   MCV 90.3 09/02/2020   PLT 267 09/02/2020    BMET    Component Value Date/Time   NA 137 09/02/2020 0438   NA 134 (L) 03/06/2013 2254   K 3.9 09/02/2020 0438   K 4.0 03/06/2013 2254   CL 98 09/02/2020 0438   CL 101 03/06/2013 2254   CO2 24 09/02/2020 0438   CO2 30  03/06/2013 2254   GLUCOSE 114 (H) 09/02/2020 0438   GLUCOSE 84 03/06/2013 2254   BUN 54 (H) 09/02/2020 0438   BUN 13 03/06/2013 2254   CREATININE 6.31 (H) 09/02/2020 0438   CREATININE 0.67 03/06/2013 2254   CALCIUM 9.0 09/02/2020 0438   CALCIUM 9.0 03/06/2013 2254   GFRNONAA 7 (L) 09/02/2020 0438   GFRNONAA >60 03/06/2013 2254   GFRAA >60 03/06/2013 2254   Estimated Creatinine Clearance: 9.3 mL/min (A) (by C-G formula based on SCr of 6.31 mg/dL (H)).  COAG Lab Results  Component Value Date   INR 1.1 08/28/2020    Radiology EEG  Result Date: 08/30/2020 Lora Havens, MD     08/30/2020  4:01 PM Patient Name: VIOLET SEABURY MRN: [id number] Epilepsy Attending: Lora Havens Referring Physician/Provider: Dr Malvin Johns Date: 08/30/2020 Duration: 33.37  mins  Patient history: 60yo F with ams. EEG to evaluate for seizure  Level of alertness: Awake  AEDs during EEG study: LTG, LRZ  Technical aspects: This EEG study was done with scalp electrodes positioned according to the 10-20 International system of electrode placement. Electrical activity was acquired at a sampling rate of 500Hz  and reviewed with a high frequency filter of 70Hz  and a low frequency filter of 1Hz . EEG data were recorded continuously and digitally stored.  Description: No posterior dominant rhythm was seen. EEG showed continuous generalized 3 to 6 Hz theta-delta slowing.  Hyperventilation and photic stimulation were not performed.    ABNORMALITY -Continuous slow, generalized  IMPRESSION: This study is suggestive of moderate diffuse encephalopathy, nonspecific etiology. No seizures or  epileptiform discharges were seen throughout the recording.  Lora Havens   EEG  Result Date: 08/28/2020 Lora Havens, MD     08/28/2020  3:40 PM Patient Name: CARILYN WOOLSTON MRN: [id number] Epilepsy Attending: Lora Havens Referring Physician/Provider: Dr Malvin Johns Date: 08/28/2020 Duration: 26.12 mins  Patient history: 60yo F with ams. EEG to evaluate for seizure Level of alertness: Awake AEDs during EEG study: LTG, LRZ Technical aspects: This EEG study was done with scalp electrodes positioned according to the 10-20 International system of electrode placement. Electrical activity was acquired at a sampling rate of 500Hz  and reviewed with a high frequency filter of 70Hz  and a low frequency filter of 1Hz . EEG data were recorded continuously and digitally stored. Description: No posterior dominant rhythm was seen. EEG showed continuous generalized 3 to 6 Hz theta-delta slowing. Generalized periodic discharges with triphasic morphology at 1hz  were also noted. Physiologic photic driving was not seen during photic stimulation.  Hyperventilation  Was not performed.   ABNORMALITY -Continuous slow, generalized -Periodic discharges with triphasic morphology, generalized IMPRESSION: This study is suggestive of moderate diffuse encephalopathy, nonspecific etiology. Given the morphology and frequency of periodic discharges with triphasic morphology are most likely due to toxic-metabolic causes. No seizures or definite epileptiform discharges were seen throughout the recording. Lora Havens   CT Head Wo Contrast  Result Date: 08/26/2020 CLINICAL DATA:  Mental status change.  Unknown cause. EXAM: CT HEAD WITHOUT CONTRAST TECHNIQUE: Contiguous axial images were obtained from the base of the skull through the vertex without intravenous contrast. COMPARISON:  CT head 03/30/2009, MRI head 07/2010 FINDINGS: Brain: No evidence of large-territorial acute infarction. No parenchymal hemorrhage. No mass lesion. No extra-axial collection. No mass effect or midline shift. Redemonstration of a nonspecific tiny 2 mm hyperdensity within the foramen of [place] noted on axial imaging and not definitely visualized on coronal or sagittal view. Finding likely related to normal anatomy/vasculature versus could represent a tiny colloid. No  hydrocephalus. Basilar cisterns are patent. Vascular: No hyperdense vessel. Atherosclerotic calcifications are present within the cavernous internal carotid arteries. Skull: No acute fracture or focal lesion. Sinuses/Orbits: Paranasal sinuses and mastoid air cells are clear. Bilateral lens replacement. Otherwise the orbits are unremarkable. Other: None. IMPRESSION: No acute intracranial abnormality. Electronically Signed   By: Iven Finn M.D.   On: 08/26/2020 06:07   MR BRAIN WO CONTRAST  Result Date: 08/28/2020 CLINICAL DATA:  Sepsis.  Possible bacterial meningitis. EXAM: MRI HEAD WITHOUT CONTRAST TECHNIQUE: Multiplanar, multiecho pulse sequences of the brain and surrounding structures were obtained without intravenous contrast. COMPARISON:  None. FINDINGS: Severely motion degraded examination. Brain: No acute infarct, mass effect or extra-axial collection. No acute or chronic hemorrhage. Normal white matter signal, parenchymal volume and CSF spaces. The midline structures are normal. Vascular: Major flow voids are preserved. Skull and upper cervical spine: Normal calvarium and skull base. Visualized upper cervical spine and soft tissues are normal. Sinuses/Orbits:No paranasal sinus fluid levels or advanced mucosal thickening. No mastoid or middle ear effusion. Normal orbits. IMPRESSION: 1. Severely motion degraded examination. 2. Within that limitation, no acute intracranial abnormality. Electronically Signed   By: Ulyses Jarred M.D.   On: 08/28/2020 23:01   DG Chest Port 1 View  Result Date: 08/29/2020 CLINICAL DATA:  Altered mental status with shortness of breath EXAM: PORTABLE CHEST 1 VIEW COMPARISON:  08/26/2020 FINDINGS: Low lung volumes. No consolidation or edema. No pleural effusion or pneumothorax. Cardiomediastinal contours are within normal limits for technique. IMPRESSION: No  acute process in the chest. Electronically Signed   By: Macy Mis M.D.   On: 08/29/2020 14:15   DG Chest Port  1 View  Result Date: 08/26/2020 CLINICAL DATA:  Weakness.  Found unresponsive.  Short of breath. EXAM: PORTABLE CHEST 1 VIEW COMPARISON:  Chest x-ray 05/02/2013, CT chest 09/13/2007 FINDINGS: The heart size and mediastinal contours are within normal limits. Low lung volumes. No focal consolidation. No pulmonary edema. No pleural effusion. No pneumothorax. No acute osseous abnormality.  Cervical surgical hardware noted. IMPRESSION: Low lung volumes with no acute cardiopulmonary disease. Electronically Signed   By: Iven Finn M.D.   On: 08/26/2020 05:19   CT RENAL STONE STUDY  Result Date: 08/26/2020 CLINICAL DATA:  Sepsis.  Renal failure. EXAM: CT ABDOMEN AND PELVIS WITHOUT CONTRAST TECHNIQUE: Multidetector CT imaging of the abdomen and pelvis was performed following the standard protocol without IV contrast. COMPARISON:  CT abdomen pelvis dated March 08, 2018. FINDINGS: Lower chest: No acute abnormality. Hepatobiliary: No focal liver abnormality. Layering sludge in the gallbladder. No gallbladder wall thickening or biliary dilatation. Pancreas: Unremarkable. No pancreatic ductal dilatation or surrounding inflammatory changes. Spleen: Normal in size without focal abnormality. Adrenals/Urinary Tract: The adrenal glands are unremarkable. Unchanged small left renal simple cysts. No renal calculi or hydronephrosis. Small amount of layering hyperdensity in the posterior bladder (series 2, image 79). No bladder wall thickening. Stomach/Bowel: Stomach is within normal limits. Appendix appears normal. No evidence of bowel wall thickening, distention, or inflammatory changes. Vascular/Lymphatic: Aortic atherosclerosis. No enlarged abdominal or pelvic lymph nodes. Reproductive: Status post hysterectomy. No adnexal masses. Other: No abdominal wall hernia or abnormality. No abdominopelvic ascites. No pneumoperitoneum. Musculoskeletal: No acute or significant osseous findings. IMPRESSION: 1. Small amount of layering  hyperdensity in the posterior bladder could reflect hemorrhage or tiny calculi. No obstructive uropathy. 2. Gallbladder sludge. 3. Aortic Atherosclerosis (ICD10-I70.0). Electronically Signed   By: Titus Dubin M.D.   On: 08/26/2020 09:25   DG FLUORO GUIDED LOC OF NEEDLE/CATH TIP FOR SPINAL INJECT RT  Result Date: 08/28/2020 CLINICAL DATA:  Altered mental status EXAM: DIAGNOSTIC LUMBAR PUNCTURE UNDER FLUOROSCOPIC GUIDANCE FLUOROSCOPY TIME:  Fluoroscopy Time:  18 seconds Radiation Exposure Index (if provided by the fluoroscopic device): 3.8 mGy PROCEDURE: Informed consent was obtained from the patient prior to the procedure, including potential complications of headache, allergy, and pain. With the patient prone, the lower back was prepped with Betadine. 1% Lidocaine was used for local anesthesia. Lumbar puncture was performed at the L4-L5 level using a 22 gauge needle with return of clear CSF. 10 ml of CSF were obtained for laboratory studies. The patient tolerated the procedure well and there were no apparent complications. IMPRESSION: Technically successful fluoroscopy guided lumbar puncture. Electronically Signed   By: Macy Mis M.D.   On: 08/28/2020 12:35      Assessment/Plan 1. AKI with hyperkalemia and metabolic acidosis secondary to possible cariprazine/alprazolam overdose v UTI 2. Will require longer term HD. 3. Plan for Permacath placement on Tuesday per Dr. Lemmie Evens, MD  09/02/2020 4:11 PM    This note was created with Dragon medical transcription system.  Any error is purely unintentional

## 2020-09-02 NOTE — Progress Notes (Signed)
PT Cancellation Note  Patient Details Name: Stacy Pearson MRN: [id number] DOB: 1960-10-12   Cancelled Treatment:    Reason Eval/Treat Not Completed: Other (RN deferring at this time as pt is sleeping. Will follow up with therapy intervention with time permitting.)  Herminio Commons, PT, DPT 10:46 AM,09/02/20

## 2020-09-02 NOTE — Progress Notes (Signed)
Physical Therapy Treatment Patient Details Name: Stacy Pearson MRN: [id number] DOB: 11-26-1960 Today's Date: 09/02/2020    History of Present Illness Pt admitted to 08/26/20 for OD (cariprazine and alprazolam) vs. UTI with sepsis. On presentation, pt was found to have neutrophilic predominant leukocytosis, lactic acidosis at 6.7, AKI, tachycardic, and tachypneic. CXR with low lung volumes and no infiltrate; HCT negative. RR called on 2/16. PMH significant for: bipolar, depression, anxiety, PD, epilepsy, HTN, and COPD.    PT Comments    Treatment focused on bed mobility due to significant BM, requiring pericare and linen change. Pt limited with participation today due to extreme lethargy and unintelligible speech. Pt required total assist +1-2 for rolling and posterior scooting in bed, with max multimodal cues for sequencing and safety. Pt demonstrates very minimal mm activation with cues for BLE movement during pericare. Pt with decline since last session, however, POC remains appropriate. Pt will continue to benefit from skilled acute PT services to address deficits for return to baseline function. Will continue to recommend CIR at DC.    Follow Up Recommendations  CIR;Supervision/Assistance - 24 hour;Supervision for mobility/OOB     Equipment Recommendations  Other (comment) (defer to post acute)    Recommendations for Other Services Rehab consult     Precautions / Restrictions Precautions Precautions: Fall Precaution Comments: DNR Restrictions Weight Bearing Restrictions: No    Mobility  Bed Mobility Overal bed mobility: Needs Assistance Bed Mobility: Rolling Rolling: Total assist         General bed mobility comments: Total assist to roll in bed for pericare and linen change after significant BM. Total assist +2 to scoot posteriorly towards HOB via chuck pad.    Transfers                 General transfer comment: OOB not appropriate at this time due to  lethargy  Ambulation/Gait                   Balance       Sitting balance - Comments: OOB not appropriate at this time due to lethargy                                    Cognition Arousal/Alertness: Lethargic Behavior During Therapy: Flat affect Overall Cognitive Status: Impaired/Different from baseline Area of Impairment: Orientation;Following commands;Awareness;Problem solving;Attention;Safety/judgement                   Current Attention Level: Selective   Following Commands: Follows one step commands inconsistently Safety/Judgement: Decreased awareness of safety;Decreased awareness of deficits   Problem Solving: Slow processing;Decreased initiation;Difficulty sequencing;Requires verbal cues;Requires tactile cues General Comments: Pt very lethargic and demonstrates very minimal purposeful movement in BLE. Able to follow ~50% of simple one step commands with max multimodal cueing.      Exercises Other Exercises Other Exercises: Pt able to participate in bed mobility, including rolling and posterior scooting, for pericare, linen change, and repositioning after significant BM. Pt required total assist and max multimodal cues for safety/sequencing due to significant lethargy. Pt able to demonstrate minimal mm activation when cued for ankle pumps and SAQ. Other Exercises: Pt educated regarding: PT role/POC and safety with mobility.    General Comments        Pertinent Vitals/Pain Pain Assessment: No/denies pain           PT Goals (current goals can now be found  in the care plan section) Acute Rehab PT Goals Patient Stated Goal: to get better PT Goal Formulation: With patient/family Time For Goal Achievement: 09/15/20 Potential to Achieve Goals: Fair Progress towards PT goals: Not progressing toward goals - comment (lethargy)    Frequency    Min 3X/week      PT Plan Current plan remains appropriate       AM-PAC PT "6 Clicks"  Mobility   Outcome Measure  Help needed turning from your back to your side while in a flat bed without using bedrails?: Total Help needed moving from lying on your back to sitting on the side of a flat bed without using bedrails?: A Lot Help needed moving to and from a bed to a chair (including a wheelchair)?: A Lot Help needed standing up from a chair using your arms (e.g., wheelchair or bedside chair)?: A Lot Help needed to walk in hospital room?: A Lot Help needed climbing 3-5 steps with a railing? : Total 6 Click Score: 10    End of Session   Activity Tolerance: Patient limited by fatigue;Other (comment) (lethargy) Patient left: in bed;with call bell/phone within reach;with bed alarm set;with family/visitor present;with nursing/sitter in room (bed in chair position, pillows under BUE and R side for neutral positioning) Nurse Communication: Mobility status PT Visit Diagnosis: Unsteadiness on feet (R26.81);Muscle weakness (generalized) (M62.81);Difficulty in walking, not elsewhere classified (R26.2);Other symptoms and signs involving the nervous system (G53.646)     Time: 8032-1224 PT Time Calculation (min) (ACUTE ONLY): 32 min  Charges:  $Therapeutic Activity: 23-37 mins                     Herminio Commons, PT, DPT 1:51 PM,09/02/20

## 2020-09-02 NOTE — Progress Notes (Signed)
[place], [place] 09/02/20  Subjective:  [name] is a 60 y.o. white female with  bipolar, depression, anxiety, parkinson's, epilepsy, HTN, and COPD. She presents to the ED with AMS. Concern for overdose (cariprazine+alprazolam) versus urinary tract infection with sepsis.   Acute dialysis while inpatient  has completed 3 treatments  Not responsive during my initial visit. When I returned later with Dr Candiss Norse, Patient was able to answer questions Able to follow simple commands  Foley- 215ml  Objective:  Vital signs in last 24 hours:  Temp:  [97.6 F (36.4 C)-98.5 F (36.9 C)] 98.5 F (36.9 C) (02/20 0449) Pulse Rate:  [70-98] 70 (02/20 0449) Resp:  [16-34] 19 (02/20 0449) BP: (160-179)/(84-99) 168/84 (02/20 0449) SpO2:  [93 %-96 %] 93 % (02/20 0449)  Weight change:  Filed Weights   08/27/20 0100 08/28/20 0245 08/29/20 0337  Weight: 64.1 kg 66.8 kg 67.1 kg    Intake/Output:    Intake/Output Summary (Last 24 hours) at 09/02/2020 1049 Last data filed at 09/01/2020 1800 Gross per 24 hour  Intake 240 ml  Output 225 ml  Net 15 ml    Physical Exam: General: Ill appearing, laying in bed  Eyes: Anicteric  Lungs:  Clear to auscultation  Heart: Regular rate and rhythm  Abdomen:  Soft, nontender,   Extremities:  trace peripheral edema.  Neurologic: Not following commands  Skin: No lesions, dry  Access: Right femoral temp HD catheter 2/16  Foley in place  Basic Metabolic Panel:  Recent Labs  Lab 08/28/20 0902 08/29/20 1018 08/30/20 1000 08/31/20 0911 09/01/20 0531 09/02/20 0438  NA 144 143 143 141 139 137  K 3.9 4.2 4.1 4.1 3.9 3.9  CL 105 104 101 100 97* 98  CO2 23 20* 22 20* 22 24  GLUCOSE 120* 94 80 72 77 114*  BUN 87* 96* 77* 50* 36* 54*  CREATININE 6.92* 8.43* 7.49* 6.00* 4.49* 6.31*  CALCIUM 8.5* 8.7* 8.8* 8.9 8.9 9.0  MG  --   --   --   --  2.2  --   PHOS 5.8* 7.3* 7.7* 7.0* 6.9*  --      CBC: Recent Labs   Lab 08/29/20 1018 08/30/20 1000 08/31/20 0911 09/01/20 0531 09/02/20 0438  WBC 7.7 6.3 7.8 7.5 6.8  HGB 10.0* 9.7* 10.1* 9.7* 11.3*  HCT 29.9* 28.9* 31.0* 29.4* 33.4*  MCV 91.2 92.0 92.5 91.6 90.3  PLT 285 249 241 237 267      Lab Results  Component Value Date   HEPBSAG NON REACTIVE 08/29/2020   HEPBSAB NON REACTIVE 08/29/2020   HEPBIGM NON REACTIVE 08/29/2020      Microbiology:  Recent Results (from the past 240 hour(s))  Resp Panel by RT-PCR (Flu A&B, Covid) Urine, Clean Catch     Status: None   Collection Time: 08/26/20  5:08 AM   Specimen: Urine, Clean Catch; Nasopharyngeal(NP) swabs in vial transport medium  Result Value Ref Range Status   SARS Coronavirus 2 by RT PCR NEGATIVE NEGATIVE Final    Comment: (NOTE) SARS-CoV-2 target nucleic acids are NOT DETECTED.  The SARS-CoV-2 RNA is generally detectable in upper respiratory specimens during the acute phase of infection. The lowest concentration of SARS-CoV-2 viral copies this assay can detect is 138 copies/mL. A negative result does not preclude SARS-Cov-2 infection and should not be used as the sole basis for treatment or other patient management decisions. A negative result may occur with  improper specimen collection/handling, submission  of specimen other than nasopharyngeal swab, presence of viral mutation(s) within the areas targeted by this assay, and inadequate number of viral copies(<138 copies/mL). A negative result must be combined with clinical observations, patient history, and epidemiological information. The expected result is Negative.  Fact Sheet for Patients:  EntrepreneurPulse.com.au  Fact Sheet for Healthcare Providers:  IncredibleEmployment.be  This test is no t yet approved or cleared by the [place] FDA and  has been authorized for detection and/or diagnosis of SARS-CoV-2 by FDA under an Emergency Use Authorization (EUA). This EUA will remain   in effect (meaning this test can be used) for the duration of the COVID-19 declaration under Section 564(b)(1) of the Act, 21 U.S.C.section 360bbb-3(b)(1), unless the authorization is terminated  or revoked sooner.       Influenza A by PCR NEGATIVE NEGATIVE Final   Influenza B by PCR NEGATIVE NEGATIVE Final    Comment: (NOTE) The Xpert Xpress SARS-CoV-2/FLU/RSV plus assay is intended as an aid in the diagnosis of influenza from Nasopharyngeal swab specimens and should not be used as a sole basis for treatment. Nasal washings and aspirates are unacceptable for Xpert Xpress SARS-CoV-2/FLU/RSV testing.  Fact Sheet for Patients: EntrepreneurPulse.com.au  Fact Sheet for Healthcare Providers: IncredibleEmployment.be  This test is not yet approved or cleared by the [place] FDA and has been authorized for detection and/or diagnosis of SARS-CoV-2 by FDA under an Emergency Use Authorization (EUA). This EUA will remain in effect (meaning this test can be used) for the duration of the COVID-19 declaration under Section 564(b)(1) of the Act, 21 U.S.C. section 360bbb-3(b)(1), unless the authorization is terminated or revoked.  Performed at [facility], [address] [place], [place] [address]   Urine culture     Status: None   Collection Time: 08/26/20  5:54 AM   Specimen: Urine, Clean Catch  Result Value Ref Range Status   Specimen Description   Final    URINE, CLEAN CATCH Performed at [facility], [address], [place], [place] [address]    Special Requests   Final    NONE Performed at [facility], [address] [place], [place] [address]    Culture   Final    NO GROWTH Performed at [facility], [place] [address], [place], [place] [address]    Report Status 08/27/2020 FINAL  Final  Culture, blood (routine x 2)     Status: None   Collection Time: 08/26/20  6:03 AM   Specimen: Left Antecubital;  Blood  Result Value Ref Range Status   Specimen Description LEFT ANTECUBITAL  Final   Special Requests   Final    BOTTLES DRAWN AEROBIC AND ANAEROBIC Blood Culture adequate volume   Culture   Final    NO GROWTH 5 DAYS Performed at [facility], [address], [place], [place] [address]    Report Status 08/31/2020 FINAL  Final  Culture, blood (routine x 2)     Status: None   Collection Time: 08/26/20  8:55 AM   Specimen: BLOOD LEFT FOREARM  Result Value Ref Range Status   Specimen Description BLOOD LEFT FOREARM  Final   Special Requests   Final    BOTTLES DRAWN AEROBIC AND ANAEROBIC Blood Culture adequate volume   Culture   Final    NO GROWTH 5 DAYS Performed at [facility], [address], [place],  [address]    Report Status 08/31/2020 FINAL  Final  CSF culture w Gram  Stain     Status: None   Collection Time: 08/28/20 12:01 PM   Specimen: PATH Cytology CSF; Cerebrospinal Fluid  Result Value Ref Range Status   Specimen Description   Final    CSF Performed at [facility], [address] [place], [place] [address]    Special Requests   Final    NONE Performed at [facility], [place] [address], [place], [place] [address]    Gram Stain   Final    WBC SEEN RED BLOOD CELLS NO ORGANISMS SEEN Performed at [facility], [address], [place], [place] [address]    Culture   Final    NO GROWTH Performed at [facility], [place] [address], [place], [place] [address]    Report Status 08/31/2020 FINAL  Final  Culture, fungus without smear     Status: None (Preliminary result)   Collection Time: 08/28/20 12:01 PM   Specimen: PATH Cytology CSF; Cerebrospinal Fluid  Result Value Ref Range Status   Specimen Description   Final    CSF Performed at [facility], [address], [place],  [address]    Special Requests NONE  Final   Culture   Final    NO FUNGUS ISOLATED AFTER 3 DAYS Performed at [facility], [place] [address] [place],  [address]    Report Status PENDING  Incomplete    Coagulation Studies: No results for input(s): LABPROT, INR in the last 72 hours.  Urinalysis: No results for input(s): COLORURINE, LABSPEC, PHURINE, GLUCOSEU, HGBUR, BILIRUBINUR, KETONESUR, PROTEINUR, UROBILINOGEN, NITRITE, LEUKOCYTESUR in the last 72 hours.  Invalid input(s): APPERANCEUR    Imaging: No results found.   Medications:   . thiamine injection 250 mg (09/01/20 1006)   . carbidopa-levodopa  1 tablet Oral Daily  . carbidopa-levodopa  2 tablet Oral BID  . Carbidopa-Levodopa ER  1 tablet Oral TID  . Carbidopa-Levodopa ER  2 tablet Oral QHS  . Chlorhexidine Gluconate Cloth  6 each Topical Q0600  . Chlorhexidine Gluconate Cloth  6 each Topical Q0600  . vitamin B-12  1,000 mcg Oral Daily   Or  . cyanocobalamin  1,000 mcg Intramuscular Daily  . heparin injection (subcutaneous)  5,000 Units Subcutaneous Q8H  . hydrALAZINE  25 mg Oral Q6H  . Ipratropium-Albuterol  1 puff Inhalation Q6H  . lamoTRIgine  25 mg Oral BID  . propranolol ER  60 mg Oral Daily  . [START ON 09/04/2020] thiamine injection  100 mg Intravenous Daily   acetaminophen **OR** acetaminophen, albuterol, hydrALAZINE, LORazepam, ondansetron (ZOFRAN) IV  Assessment/ Plan:  60 y.o. female with PMHX of bipolar, depression, anxiety, parkinson's, epilepsy, HTN, and COPD was admitted on 08/26/2020   Principal Problem:   Delirium Active Problems:   Bipolar 1 disorder (HCC)   COPD (chronic obstructive pulmonary disease) (HCC)   Essential tremor   Migraine without aura   Parkinson's disease ([place])   Epilepsy ([place])   Depression with anxiety   HTN (hypertension)   Acute metabolic encephalopathy   UTI (urinary tract infection)   AKI (acute kidney injury) ([place])   Severe sepsis with septic shock (HCC)  Confusion [R41.0] Severe sepsis with septic shock ([place]) [A41.9, R65.21] Severe sepsis ([place]) [A41.9, R65.20] Altered  mental status, unspecified altered mental status type [R41.82] Sepsis, due to unspecified organism, unspecified whether acute organ dysfunction present ([place]) [A41.9]  #. Acute kidney injury with hyperkalemia and metabolic acidosis: baseline creatinine 0.8 with normal GFR on 01/31/2020 -  monitor volume status.   02/19 0701 - 02/20 0700 In: 80 [P.O.:600; IV Piggyback:50] Out: 255 [Urine:255]   Lab Results  Component Value Date   CREATININE 6.31 (H) 09/02/2020   CREATININE 4.49 (H) 09/01/2020   CREATININE 6.00 (H) 08/31/2020   Creatinine increased  Mental status declined Will schedule dialysis tomorrow Will continue to monitor daily If she will continue to require treatments, will need perncath    # Anion gap metabolic acidosis: Salicylate levels negative, acetaminophen levels negative. Normal glucose. Lactic acidosis has improved.  Most likely due to uremia/AKI    # Hypernatremia: secondary to free water deficit.  - improved with dialysis -continued to remain stable   LOS: [place] 2/20/202210:49 AM  [place], [place]

## 2020-09-02 NOTE — Plan of Care (Signed)
Continuing with plan of care. 

## 2020-09-03 ENCOUNTER — Other Ambulatory Visit (INDEPENDENT_AMBULATORY_CARE_PROVIDER_SITE_OTHER): Payer: Self-pay | Admitting: Vascular Surgery

## 2020-09-03 ENCOUNTER — Inpatient Hospital Stay: Payer: Medicare HMO

## 2020-09-03 ENCOUNTER — Other Ambulatory Visit: Payer: Self-pay

## 2020-09-03 DIAGNOSIS — R410 Disorientation, unspecified: Secondary | ICD-10-CM | POA: Diagnosis not present

## 2020-09-03 DIAGNOSIS — N179 Acute kidney failure, unspecified: Secondary | ICD-10-CM | POA: Diagnosis not present

## 2020-09-03 DIAGNOSIS — R4182 Altered mental status, unspecified: Secondary | ICD-10-CM | POA: Diagnosis not present

## 2020-09-03 DIAGNOSIS — G9341 Metabolic encephalopathy: Secondary | ICD-10-CM | POA: Diagnosis not present

## 2020-09-03 DIAGNOSIS — F319 Bipolar disorder, unspecified: Secondary | ICD-10-CM | POA: Diagnosis not present

## 2020-09-03 LAB — CBC WITH DIFFERENTIAL/PLATELET
Abs Immature Granulocytes: 0.06 10*3/uL (ref 0.00–0.07)
Basophils Absolute: 0.1 10*3/uL (ref 0.0–0.1)
Basophils Relative: 1 %
Eosinophils Absolute: 0.1 10*3/uL (ref 0.0–0.5)
Eosinophils Relative: 1 %
HCT: 31.3 % — ABNORMAL LOW (ref 36.0–46.0)
Hemoglobin: 10.2 g/dL — ABNORMAL LOW (ref 12.0–15.0)
Immature Granulocytes: 1 %
Lymphocytes Relative: 10 %
Lymphs Abs: 0.8 10*3/uL (ref 0.7–4.0)
MCH: 29.8 pg (ref 26.0–34.0)
MCHC: 32.6 g/dL (ref 30.0–36.0)
MCV: 91.5 fL (ref 80.0–100.0)
Monocytes Absolute: 0.7 10*3/uL (ref 0.1–1.0)
Monocytes Relative: 9 %
Neutro Abs: 6 10*3/uL (ref 1.7–7.7)
Neutrophils Relative %: 78 %
Platelets: 340 10*3/uL (ref 150–400)
RBC: 3.42 MIL/uL — ABNORMAL LOW (ref 3.87–5.11)
RDW: 14 % (ref 11.5–15.5)
WBC: 7.7 10*3/uL (ref 4.0–10.5)
nRBC: 0 % (ref 0.0–0.2)

## 2020-09-03 LAB — COMPREHENSIVE METABOLIC PANEL
ALT: <5 U/L (ref 0–44)
AST: 11 U/L — ABNORMAL LOW (ref 15–41)
Albumin: 2.7 g/dL — ABNORMAL LOW (ref 3.5–5.0)
Alkaline Phosphatase: 72 U/L (ref 38–126)
Anion gap: 22 — ABNORMAL HIGH (ref 5–15)
BUN: 72 mg/dL — ABNORMAL HIGH (ref 6–20)
CO2: 18 mmol/L — ABNORMAL LOW (ref 22–32)
Calcium: 8.9 mg/dL (ref 8.9–10.3)
Chloride: 100 mmol/L (ref 98–111)
Creatinine, Ser: 9.14 mg/dL — ABNORMAL HIGH (ref 0.44–1.00)
GFR, Estimated: 5 mL/min — ABNORMAL LOW (ref >60)
Glucose, Bld: 85 mg/dL (ref 70–99)
Potassium: 5 mmol/L (ref 3.5–5.1)
Sodium: 140 mmol/L (ref 135–145)
Total Bilirubin: 1.2 mg/dL (ref 0.3–1.2)
Total Protein: 7 g/dL (ref 6.5–8.1)

## 2020-09-03 LAB — URINALYSIS, ROUTINE W REFLEX MICROSCOPIC
Bilirubin Urine: NEGATIVE
Glucose, UA: NEGATIVE mg/dL
Ketones, ur: 5 mg/dL — AB
Nitrite: NEGATIVE
Protein, ur: >=300 mg/dL — AB
Specific Gravity, Urine: 1.009 (ref 1.005–1.030)
pH: 5 (ref 5.0–8.0)

## 2020-09-03 LAB — BASIC METABOLIC PANEL
Anion gap: 21 — ABNORMAL HIGH (ref 5–15)
BUN: 63 mg/dL — ABNORMAL HIGH (ref 6–20)
CO2: 19 mmol/L — ABNORMAL LOW (ref 22–32)
Calcium: 9 mg/dL (ref 8.9–10.3)
Chloride: 100 mmol/L (ref 98–111)
Creatinine, Ser: 7.97 mg/dL — ABNORMAL HIGH (ref 0.44–1.00)
GFR, Estimated: 5 mL/min — ABNORMAL LOW (ref >60)
Glucose, Bld: 77 mg/dL (ref 70–99)
Potassium: 5.1 mmol/L (ref 3.5–5.1)
Sodium: 140 mmol/L (ref 135–145)

## 2020-09-03 LAB — CBC
HCT: 42.8 % (ref 36.0–46.0)
Hemoglobin: 13.5 g/dL (ref 12.0–15.0)
MCH: 29.9 pg (ref 26.0–34.0)
MCHC: 31.5 g/dL (ref 30.0–36.0)
MCV: 94.9 fL (ref 80.0–100.0)
Platelets: 308 10*3/uL (ref 150–400)
RBC: 4.51 MIL/uL (ref 3.87–5.11)
RDW: 13.9 % (ref 11.5–15.5)
WBC: 8.4 10*3/uL (ref 4.0–10.5)
nRBC: 0 % (ref 0.0–0.2)

## 2020-09-03 LAB — PROTIME-INR
INR: 1.2 (ref 0.8–1.2)
Prothrombin Time: 14.9 seconds (ref 11.4–15.2)

## 2020-09-03 LAB — LACTIC ACID, PLASMA
Lactic Acid, Venous: 1 mmol/L (ref 0.5–1.9)
Lactic Acid, Venous: 0.8 mmol/L (ref 0.5–1.9)

## 2020-09-03 LAB — APTT: aPTT: 47 seconds — ABNORMAL HIGH (ref 24–36)

## 2020-09-03 MED ORDER — CARBIDOPA-LEVODOPA 25-100 MG PO TABS
1.0000 | ORAL_TABLET | Freq: Three times a day (TID) | ORAL | Status: DC
  Administered 2020-09-04 – 2020-09-10 (×14): 1 via ORAL
  Filled 2020-09-03 (×23): qty 1

## 2020-09-03 MED ORDER — SODIUM CHLORIDE 0.9 % IV SOLN
INTRAVENOUS | Status: DC | PRN
  Administered 2020-09-03 – 2020-09-05 (×2): 250 mL via INTRAVENOUS

## 2020-09-03 MED ORDER — CARBIDOPA-LEVODOPA ER 25-100 MG PO TBCR
1.0000 | EXTENDED_RELEASE_TABLET | Freq: Every day | ORAL | Status: DC
  Filled 2020-09-03: qty 1

## 2020-09-03 MED ORDER — MORPHINE SULFATE (PF) 2 MG/ML IV SOLN
INTRAVENOUS | Status: AC
  Filled 2020-09-03: qty 1

## 2020-09-03 MED ORDER — LACTATED RINGERS IV BOLUS (SEPSIS)
500.0000 mL | Freq: Once | INTRAVENOUS | Status: AC
  Administered 2020-09-03: 500 mL via INTRAVENOUS

## 2020-09-03 MED ORDER — CARBIDOPA-LEVODOPA 25-100 MG PO TABS: 1.0000 | ORAL_TABLET | Freq: Two times a day (BID) | ORAL | Status: DC

## 2020-09-03 MED ORDER — MORPHINE SULFATE (PF) 2 MG/ML IV SOLN
2.0000 mg | Freq: Once | INTRAVENOUS | Status: AC
Start: 2020-09-03 — End: 2020-09-03
  Administered 2020-09-03: 2 mg via INTRAVENOUS

## 2020-09-03 NOTE — Progress Notes (Signed)
[facility] hospital liaison note:  This patient is currently followed by [facility] community Palliative program at home. TOC Stephanie Bowen made aware. Liaison to follow for discharge disposition. Flo Shanks BSN, RN, [facility] [employer] [phone]

## 2020-09-03 NOTE — Hospital Course (Addendum)
2/16-> transfer to ICU/stepdown and started emergent hemodialysis placement of temporary dialysis catheter 2/17-> mental status started improving some with dialysis 2/18 significant improvement in mental status with ongoing dialysis.  Antibiotics stopped by ID 2/19 transferred on the floor  2/20 getting more sleepy -neuro signed off 2/21 -remains very lethargic so neurology reconsulted.   2/22 Permacath planned for 2/22 per vascular surgery for hemodialysis. Neuro reduced Sinemet dose on 2/21. Patient more awake today but now has Temp 100.9 so I've requested ID for reeval. Requested to D/C foley. Palliative care c/s

## 2020-09-03 NOTE — Progress Notes (Signed)
Patient was given PRN neb tx and RR decreased to 28.

## 2020-09-03 NOTE — Progress Notes (Addendum)
PROGRESS NOTE    Stacy Pearson  FWY:[id number] DOB: 1960/11/07 DOA: 08/26/2020 PCP: Valera Castle, MD   Brief Narrative: Taken from H&P. Stacy Pearson is a 60 y.o. female with medical history significant of bipolar, depression, anxiety, Parkinson, epilepsy, HTN, COPD, presents with AMS. On presentation patient was confused and agitated.  Admitting provider was able to contact her mother who is the only contact listed, and according to her she lives alone and was at her normal state of health until Friday afternoon. She was sleeping most of the time since then and found to be more confused and somnolent and EMS was called. Mother was not aware of any recent illnesses. On presentation she was found to have neutrophilic predominant leukocytosis, lactic acidosis at 6.7, Covid PCR negative, AKI, tachycardic and tachypneic.  Chest x-ray with low lung volumes and no infiltrate.  CT head was negative.  Lactic acidosis started improving, worsening renal function and hyperkalemia next morning.  Procalcitonin elevated at 1.77.  Blood and urine cultures negative.  Remained very altered, unable to provide any history. Worsening renal function, nephrology started emergent HD on 2/16 after transfer to ICU/SD Neurology was consulted with persistent altered mental status, LP was done and CSF not c/w infectious etio, on empiric rocephin which was stopped on 2/18 by ID  2/16-> transfer to ICU/stepdown and started emergent hemodialysis placement of temporary dialysis catheter 2/17-> mental status started improving some with dialysis 2/18 significant improvement in mental status with ongoing dialysis.  Antibiotics stopped by ID 2/19 transferred on the floor  2/20 getting more sleepy -neuro signed off 2/21 -remains very lethargic so neurology reconsulted.  Permacath planned for 2/21 per vascular surgery for hemodialysis  Subjective: She remains sleepy and obtunded.  Permacath planned for  tomorrow  Assessment & Plan:   Principal Problem:   Delirium Active Problems:   Bipolar 1 disorder (HCC)   COPD (chronic obstructive pulmonary disease) (HCC)   Essential tremor   Migraine without aura   Parkinson's disease ([place])   Epilepsy ([place])   Depression with anxiety   HTN (hypertension)   Acute metabolic encephalopathy   UTI (urinary tract infection)   AKI (acute kidney injury) ([place])   Severe sepsis with septic shock (HCC)  Acute Metabolic encephalopathy   Present on admission due to polypharmacy and possible overdose Significant improvement in her mental status with dialysis but now again getting more sleepy since yesterday so neurology reconsulted and they have switched Sinemet IR 1 tablet p.o. 3 times daily Antibiotics discontinued per ID as urine, blood and CSF culture remain negative  AKI -requiring dialysis per nephrology.  Nephrology planning to get permacath in next week for ongoing dialysis need  Acute hypoxic respiratory failure Now resolved, weaned off BiPAP and on nasal cannula oxygen.  Hyperkalemia.  Resolved  Most likely secondary to worsening renal function.  Concern of renal tubular acidosis.  Anion gap metabolic acidosis.  Most likely secondary to lactic acidosis. Elevated CK so rhabdomyolysis can be playing a role. -Improving with HD  Bipolar 1 disorder with depression and anxiety.  For now hold Ativan as she is sleepy  COPD.  Appears stable, no wheezing and chest x-ray without any acute abnormality. -As needed bronchodilators  History of Parkinson's disease.  Unknown baseline -Neurology changing Sinemet 1 tablet p.o. 3 times daily due to her being sleepy -Continue with propranolol-do not know whether tremors are essential or secondary to underlying Parkinson's.  History of migraine. -Continue as needed Tylenol  History of  seizure disorder  Seizure ruled out, EEG negative for any epileptiform waves Continue Lamictal  Hypertension.  Blood  pressure running high On home propranolol. -Continue hydralazine to 25 mg p.o. every 6 hours and continue IV as needed  Generalized weakness PT, OT recommends CIR.  TOC aware   Objective: Vitals:   09/03/20 1410 09/03/20 1415 09/03/20 1430 09/03/20 1533  BP: (!) 157/79 (!) 154/77 (!) 152/74 (!) 167/87  Pulse: (!) 103 (!) 102 (!) 112 (!) 105  Resp: (!) 21 (!) 21 (!) 27 (!) 22  Temp: 98.9 F (37.2 C)   98.3 F (36.8 C)  TempSrc: Axillary   Oral  SpO2:    90%  Weight:      Height:        Intake/Output Summary (Last 24 hours) at 09/03/2020 1705 Last data filed at 09/03/2020 1519 Gross per 24 hour  Intake 61.01 ml  Output -87 ml  Net 148.01 ml   Filed Weights   08/27/20 0100 08/28/20 0245 08/29/20 0337  Weight: 64.1 kg 66.8 kg 67.1 kg    Examination:  General.  Chronically ill-appearing lady, in no acute distress, sleepy today Pulmonary.  Lungs clear bilaterally, normal respiratory effort.  CV.  Regular rate and rhythm, no JVD, rub or murmur. Abdomen.  Soft, nontender, nondistended, BS positive. CNS.  Sleepy today extremities.  No edema, no cyanosis, pulses intact and symmetrical. Psychiatry.  Difficult to evaluate due to her mental status  DVT prophylaxis: Lovenox Code Status: DNR-admitting provider confirmed with mother Family Communication: I've been trying to call her mother daily for last few days but no luck (tried again on 2/21)  Disposition Plan: unknown Status is: Inpatient    Remains inpatient appropriate because:Inpatient level of care appropriate due to severity of illness   Dispo: The patient is from: Home              Anticipated d/c is to: CIR              Anticipated d/c date is: > 3 days              Patient currently is not medically stable to d/c.     Difficult to place patient Yes              Level of care: Med-Surg  Consultants:   Psychiatry  Nephrology  Neurology  ID  Procedures:  Antimicrobials:  None  Data Reviewed: I have  personally reviewed following labs and imaging studies  CBC: Recent Labs  Lab 08/30/20 1000 08/31/20 0911 09/01/20 0531 09/02/20 0438 09/03/20 0529  WBC 6.3 7.8 7.5 6.8 8.4  HGB 9.7* 10.1* 9.7* 11.3* 13.5  HCT 28.9* 31.0* 29.4* 33.4* 42.8  MCV 92.0 92.5 91.6 90.3 94.9  PLT 249 241 237 267 426   Basic Metabolic Panel: Recent Labs  Lab 08/28/20 0902 08/29/20 1018 08/30/20 1000 08/31/20 0911 09/01/20 0531 09/02/20 0438 09/03/20 0529  NA 144 143 143 141 139 137 140  K 3.9 4.2 4.1 4.1 3.9 3.9 5.1  CL 105 104 101 100 97* 98 100  CO2 23 20* 22 20* 22 24 19*  GLUCOSE 120* 94 80 72 77 114* 77  BUN 87* 96* 77* 50* 36* 54* 63*  CREATININE 6.92* 8.43* 7.49* 6.00* 4.49* 6.31* 7.97*  CALCIUM 8.5* 8.7* 8.8* 8.9 8.9 9.0 9.0  MG  --   --   --   --  2.2  --   --   PHOS 5.8* 7.3* 7.7* 7.0*  6.9*  --   --    GFR: Estimated Creatinine Clearance: 7.4 mL/min (A) (by C-G formula based on SCr of 7.97 mg/dL (H)). Liver Function Tests: Recent Labs  Lab 08/28/20 0902 08/29/20 1018 08/30/20 1000 08/31/20 0911  AST  --   --  20  --   ALT  --   --  <5  --   ALKPHOS  --   --  75  --   BILITOT  --   --  0.6  --   PROT  --   --  6.5  --   ALBUMIN 2.9* 2.9* 2.7*  2.7* 2.7*   No results for input(s): LIPASE, AMYLASE in the last 168 hours. No results for input(s): AMMONIA in the last 168 hours. Coagulation Profile: Recent Labs  Lab 08/28/20 0902  INR 1.1   Cardiac Enzymes: Recent Labs  Lab 08/29/20 1018  CKTOTAL 183   BNP (last 3 results) No results for input(s): PROBNP in the last 8760 hours. HbA1C: No results for input(s): HGBA1C in the last 72 hours. CBG: Recent Labs  Lab 08/28/20 1449 08/29/20 1152 08/30/20 1127  GLUCAP 96 83 95   Lipid Profile: No results for input(s): CHOL, HDL, LDLCALC, TRIG, CHOLHDL, LDLDIRECT in the last 72 hours. Thyroid Function Tests: No results for input(s): TSH, T4TOTAL, FREET4, T3FREE, THYROIDAB in the last 72 hours. Anemia Panel: No  results for input(s): VITAMINB12, FOLATE, FERRITIN, TIBC, IRON, RETICCTPCT in the last 72 hours. Sepsis Labs: No results for input(s): PROCALCITON, LATICACIDVEN in the last 168 hours.  Recent Results (from the past 240 hour(s))  Resp Panel by RT-PCR (Flu A&B, Covid) Urine, Clean Catch     Status: None   Collection Time: 08/26/20  5:08 AM   Specimen: Urine, Clean Catch; Nasopharyngeal(NP) swabs in vial transport medium  Result Value Ref Range Status   SARS Coronavirus 2 by RT PCR NEGATIVE NEGATIVE Final    Comment: (NOTE) SARS-CoV-2 target nucleic acids are NOT DETECTED.  The SARS-CoV-2 RNA is generally detectable in upper respiratory specimens during the acute phase of infection. The lowest concentration of SARS-CoV-2 viral copies this assay can detect is 138 copies/mL. A negative result does not preclude SARS-Cov-2 infection and should not be used as the sole basis for treatment or other patient management decisions. A negative result may occur with  improper specimen collection/handling, submission of specimen other than nasopharyngeal swab, presence of viral mutation(s) within the areas targeted by this assay, and inadequate number of viral copies(<138 copies/mL). A negative result must be combined with clinical observations, patient history, and epidemiological information. The expected result is Negative.  Fact Sheet for Patients:  EntrepreneurPulse.com.au  Fact Sheet for Healthcare Providers:  IncredibleEmployment.be  This test is no t yet approved or cleared by the [place] FDA and  has been authorized for detection and/or diagnosis of SARS-CoV-2 by FDA under an Emergency Use Authorization (EUA). This EUA will remain  in effect (meaning this test can be used) for the duration of the COVID-19 declaration under Section 564(b)(1) of the Act, 21 U.S.C.section 360bbb-3(b)(1), unless the authorization is terminated  or revoked sooner.        Influenza A by PCR NEGATIVE NEGATIVE Final   Influenza B by PCR NEGATIVE NEGATIVE Final    Comment: (NOTE) The Xpert Xpress SARS-CoV-2/FLU/RSV plus assay is intended as an aid in the diagnosis of influenza from Nasopharyngeal swab specimens and should not be used as a sole basis for treatment. Nasal washings and aspirates are  unacceptable for Xpert Xpress SARS-CoV-2/FLU/RSV testing.  Fact Sheet for Patients: EntrepreneurPulse.com.au  Fact Sheet for Healthcare Providers: IncredibleEmployment.be  This test is not yet approved or cleared by the [place] FDA and has been authorized for detection and/or diagnosis of SARS-CoV-2 by FDA under an Emergency Use Authorization (EUA). This EUA will remain in effect (meaning this test can be used) for the duration of the COVID-19 declaration under Section 564(b)(1) of the Act, 21 U.S.C. section 360bbb-3(b)(1), unless the authorization is terminated or revoked.  Performed at [facility], [address] [place], [place] [address]   Urine culture     Status: None   Collection Time: 08/26/20  5:54 AM   Specimen: Urine, Clean Catch  Result Value Ref Range Status   Specimen Description   Final    URINE, CLEAN CATCH Performed at [facility], [address], [place], [place] [address]    Special Requests   Final    NONE Performed at [facility], [address] [place], [place] [address]    Culture   Final    NO GROWTH Performed at [facility], [place] [address] [place], [place] [address]    Report Status 08/27/2020 FINAL  Final  Culture, blood (routine x 2)     Status: None   Collection Time: 08/26/20  6:03 AM   Specimen: Left Antecubital; Blood  Result Value Ref Range Status   Specimen Description LEFT ANTECUBITAL  Final   Special Requests   Final    BOTTLES DRAWN AEROBIC AND ANAEROBIC Blood Culture adequate volume   Culture   Final    NO GROWTH 5  DAYS Performed at [facility], [address], [place], [place] [address]    Report Status 08/31/2020 FINAL  Final  Culture, blood (routine x 2)     Status: None   Collection Time: 08/26/20  8:55 AM   Specimen: BLOOD LEFT FOREARM  Result Value Ref Range Status   Specimen Description BLOOD LEFT FOREARM  Final   Special Requests   Final    BOTTLES DRAWN AEROBIC AND ANAEROBIC Blood Culture adequate volume   Culture   Final    NO GROWTH 5 DAYS Performed at [facility], [address], [place], [place] [address]    Report Status 08/31/2020 FINAL  Final  CSF culture w Gram Stain     Status: None   Collection Time: 08/28/20 12:01 PM   Specimen: PATH Cytology CSF; Cerebrospinal Fluid  Result Value Ref Range Status   Specimen Description   Final    CSF Performed at [facility], [address], [place], [place] [address]    Special Requests   Final    NONE Performed at [facility], [place] [address] [place], [place] [address]    Gram Stain   Final    WBC SEEN RED BLOOD CELLS NO ORGANISMS SEEN Performed at [facility], [address] [place], [place] [address]    Culture   Final    NO GROWTH Performed at [facility], [place] [address], [place], [place] [address]    Report Status 08/31/2020 FINAL  Final  Culture, fungus without smear     Status: None (Preliminary result)   Collection Time: 08/28/20 12:01 PM   Specimen: PATH Cytology CSF; Cerebrospinal Fluid  Result Value Ref Range Status   Specimen Description   Final    CSF Performed at [facility], [address] [place], [place] [address]    Special Requests NONE  Final   Culture   Final    NO FUNGUS ISOLATED AFTER 6 DAYS Performed at [facility], [place] [address], [place], [place] [address]    Report Status PENDING  Incomplete     Radiology Studies: No results found.  Scheduled Meds: . carbidopa-levodopa  1 tablet Oral TID  . Chlorhexidine  Gluconate Cloth  6 each Topical Q0600  . Chlorhexidine Gluconate Cloth  6 each Topical Q0600  . vitamin B-12  1,000 mcg Oral Daily   Or  . cyanocobalamin  1,000 mcg Intramuscular Daily  . heparin injection (subcutaneous)  5,000 Units Subcutaneous Q8H  . hydrALAZINE  25 mg Oral Q6H  . Ipratropium-Albuterol  1 puff Inhalation Q6H  . lamoTRIgine  25 mg Oral BID  . propranolol ER  60 mg Oral Daily  . [START ON 09/04/2020] thiamine injection  100 mg Intravenous Daily   Continuous Infusions: . sodium chloride Stopped (09/03/20 1244)  . thiamine injection Stopped (09/03/20 1139)     LOS: 8 days   Time spent : 20 minutes.  Max Sane, MD [facility]  If 7PM-7AM, please contact night-coverage [email or web address]  09/03/2020, 5:05 PM   This record has been created using Systems analyst. Errors have been sought and corrected,but may not always be located. Such creation errors do not reflect on the standard of care.

## 2020-09-03 NOTE — Progress Notes (Signed)
Subjective: Brief HPI: Stacy Pearson is a 60 y.o. female with Parkinson's disease and epilepsy admitted with somnolence, confusion, agitation and lethargy. Found to have sepsis with unclear/unknown source, elevated serum lactate along with significant AKI. Started on Meningitis coverage and LP after about 2 doses of Vanc, Cefepime and Acyclovir with pleocytosis WBC count of 49 and mildly elevated CSF protein. CTH negative. MRI Brain negative for an significant abnormalities. rEEG with no seizures, notable for triphasic waves along the ictal-interictal spectrum with reactivity which is somewhat less concerning for seizures. She underwent dialysis x 3 with significant improvement in her mentation. EEG x 2 without seizure activity. LP was with minimal pleocytosis on Ceftriaxone menigitis dose. CSF cultures pending. Recommendations from last Neurology note on 2/18 were to continue ceftriaxone per ID until CSF Cx negative, to continue Thiamine high dose replacement until levels returns, to continue Vit B12 replacement PO or IM and management od AKI per Neph. Neurology had signed off at that time.  Objective: Current vital signs: BP (!) 164/94   Pulse (!) 101   Temp 98.4 F (36.9 C)   Resp (!) 22   Ht 5\' 7"  (1.702 m)   Wt 67.1 kg   SpO2 95%   BMI 23.17 kg/m  Vital signs in last 24 hours: Temp:  [97.5 F (36.4 C)-99.1 F (37.3 C)] 98.4 F (36.9 C) (02/21 1000) Pulse Rate:  [78-103] 101 (02/21 1000) Resp:  [16-40] 22 (02/21 0800) BP: (157-184)/(82-96) 164/94 (02/21 1000) SpO2:  [94 %-100 %] 95 % (02/21 1000)  Intake/Output from previous day: 02/20 0701 - 02/21 0700 In: 42.9 [IV Piggyback:42.9] Out: 775 [Urine:775] Intake/Output this shift: No intake/output data recorded. Nutritional status:  Diet Order            Diet NPO time specified  Diet effective midnight           DIET - DYS 1 Room service appropriate? Yes; Fluid consistency: Thin  Diet effective now                  Neurologic Exam: Ment: Initially asleep, she awakens to an eye-open state with decreased responsiveness to questions and commands. She will not move extremities to command or light noxious but will withdraw BLE to noxious plantar stimulation. Only verbal output was two one-word replies to questions, including "yes". She does not initiate eye contact. She will not blink to threat, continuing to stare straight forward.  CN: PERRL. Eyes are conjugate at the midline. Stares straight forward and does not gaze towards or away from any visual stimuli. Face grossly symmetric. Does not smile to command.  Motor/Sensory: Does not move BUE to noxious. Tone decreased to BUE and BLE. Will withdraw each LE after sustained noxious plantar stimulation.  Reflexes: Hypoactive x 4. No ankle clonus. Toes downgoing.  Cerebellar/Gait: Unable to assess  Lab Results: Results for orders placed or performed during the hospital encounter of 08/26/20 (from the past 48 hour(s))  CBC     Status: Abnormal   Collection Time: 09/02/20  4:38 AM  Result Value Ref Range   WBC 6.8 4.0 - 10.5 K/uL   RBC 3.70 (L) 3.87 - 5.11 MIL/uL   Hemoglobin 11.3 (L) 12.0 - 15.0 g/dL   HCT 33.4 (L) 36.0 - 46.0 %   MCV 90.3 80.0 - 100.0 fL   MCH 30.5 26.0 - 34.0 pg   MCHC 33.8 30.0 - 36.0 g/dL   RDW 13.5 11.5 - 15.5 %   Platelets 267 150 -  400 K/uL   nRBC 0.0 0.0 - 0.2 %    Comment: Performed at [facility], [address] [place], [place] [address]  Basic metabolic panel     Status: Abnormal   Collection Time: 09/02/20  4:38 AM  Result Value Ref Range   Sodium 137 135 - 145 mmol/L   Potassium 3.9 3.5 - 5.1 mmol/L   Chloride 98 98 - 111 mmol/L   CO2 24 22 - 32 mmol/L   Glucose, Bld 114 (H) 70 - 99 mg/dL    Comment: Glucose reference range applies only to samples taken after fasting for at least 8 hours.   BUN 54 (H) 6 - 20 mg/dL   Creatinine, Ser 6.31 (H) 0.44 - 1.00 mg/dL   Calcium 9.0 8.9 - 10.3 mg/dL   GFR,  Estimated 7 (L) >60 mL/min    Comment: (NOTE) Calculated using the CKD-EPI Creatinine Equation (2021)    Anion gap 15 5 - 15    Comment: Performed at [facility], [address] [place], [place] [address]  CBC     Status: None   Collection Time: 09/03/20  5:29 AM  Result Value Ref Range   WBC 8.4 4.0 - 10.5 K/uL   RBC 4.51 3.87 - 5.11 MIL/uL   Hemoglobin 13.5 12.0 - 15.0 g/dL   HCT 42.8 36.0 - 46.0 %   MCV 94.9 80.0 - 100.0 fL   MCH 29.9 26.0 - 34.0 pg   MCHC 31.5 30.0 - 36.0 g/dL   RDW 13.9 11.5 - 15.5 %   Platelets 308 150 - 400 K/uL   nRBC 0.0 0.0 - 0.2 %    Comment: Performed at [facility], [address] [place], [place] [address]  Basic metabolic panel     Status: Abnormal   Collection Time: 09/03/20  5:29 AM  Result Value Ref Range   Sodium 140 135 - 145 mmol/L    Comment:  ELECTROLYTES REPEATED SDR   Potassium 5.1 3.5 - 5.1 mmol/L   Chloride 100 98 - 111 mmol/L   CO2 19 (L) 22 - 32 mmol/L   Glucose, Bld 77 70 - 99 mg/dL    Comment: Glucose reference range applies only to samples taken after fasting for at least 8 hours.   BUN 63 (H) 6 - 20 mg/dL   Creatinine, Ser 7.97 (H) 0.44 - 1.00 mg/dL   Calcium 9.0 8.9 - 10.3 mg/dL   GFR, Estimated 5 (L) >60 mL/min    Comment: (NOTE) Calculated using the CKD-EPI Creatinine Equation (2021)    Anion gap 21 (H) 5 - 15    Comment: Performed at [facility], [address] [place], [place] [address]    Recent Results (from the past 240 hour(s))  Resp Panel by RT-PCR (Flu A&B, Covid) Urine, Clean Catch     Status: None   Collection Time: 08/26/20  5:08 AM   Specimen: Urine, Clean Catch; Nasopharyngeal(NP) swabs in vial transport medium  Result Value Ref Range Status   SARS Coronavirus 2 by RT PCR NEGATIVE NEGATIVE Final    Comment: (NOTE) SARS-CoV-2 target nucleic acids are NOT DETECTED.  The SARS-CoV-2 RNA is generally detectable in upper respiratory specimens during the acute phase of  infection. The lowest concentration of SARS-CoV-2 viral copies this assay can detect is 138 copies/mL. A negative result does not preclude SARS-Cov-2 infection and should not be used as the sole basis for treatment or other patient management decisions. A negative result  may occur with  improper specimen collection/handling, submission of specimen other than nasopharyngeal swab, presence of viral mutation(s) within the areas targeted by this assay, and inadequate number of viral copies(<138 copies/mL). A negative result must be combined with clinical observations, patient history, and epidemiological information. The expected result is Negative.  Fact Sheet for Patients:  EntrepreneurPulse.com.au  Fact Sheet for Healthcare Providers:  IncredibleEmployment.be  This test is no t yet approved or cleared by the [place] FDA and  has been authorized for detection and/or diagnosis of SARS-CoV-2 by FDA under an Emergency Use Authorization (EUA). This EUA will remain  in effect (meaning this test can be used) for the duration of the COVID-19 declaration under Section 564(b)(1) of the Act, 21 U.S.C.section 360bbb-3(b)(1), unless the authorization is terminated  or revoked sooner.       Influenza A by PCR NEGATIVE NEGATIVE Final   Influenza B by PCR NEGATIVE NEGATIVE Final    Comment: (NOTE) The Xpert Xpress SARS-CoV-2/FLU/RSV plus assay is intended as an aid in the diagnosis of influenza from Nasopharyngeal swab specimens and should not be used as a sole basis for treatment. Nasal washings and aspirates are unacceptable for Xpert Xpress SARS-CoV-2/FLU/RSV testing.  Fact Sheet for Patients: EntrepreneurPulse.com.au  Fact Sheet for Healthcare Providers: IncredibleEmployment.be  This test is not yet approved or cleared by the [place] FDA and has been authorized for detection and/or diagnosis of SARS-CoV-2  by FDA under an Emergency Use Authorization (EUA). This EUA will remain in effect (meaning this test can be used) for the duration of the COVID-19 declaration under Section 564(b)(1) of the Act, 21 U.S.C. section 360bbb-3(b)(1), unless the authorization is terminated or revoked.  Performed at [facility], [address], [place], [place] [address]   Urine culture     Status: None   Collection Time: 08/26/20  5:54 AM   Specimen: Urine, Clean Catch  Result Value Ref Range Status   Specimen Description   Final    URINE, CLEAN CATCH Performed at [facility], [address], [place], [place] [address]    Special Requests   Final    NONE Performed at [facility], [address], [place], [place] [address]    Culture   Final    NO GROWTH Performed at [facility], [place] [address] [place], [place] [address]    Report Status 08/27/2020 FINAL  Final  Culture, blood (routine x 2)     Status: None   Collection Time: 08/26/20  6:03 AM   Specimen: Left Antecubital; Blood  Result Value Ref Range Status   Specimen Description LEFT ANTECUBITAL  Final   Special Requests   Final    BOTTLES DRAWN AEROBIC AND ANAEROBIC Blood Culture adequate volume   Culture   Final    NO GROWTH 5 DAYS Performed at [facility], [address] [place], [place] [address]    Report Status 08/31/2020 FINAL  Final  Culture, blood (routine x 2)     Status: None   Collection Time: 08/26/20  8:55 AM   Specimen: BLOOD LEFT FOREARM  Result Value Ref Range Status   Specimen Description BLOOD LEFT FOREARM  Final   Special Requests   Final    BOTTLES DRAWN AEROBIC AND ANAEROBIC Blood Culture adequate volume   Culture   Final    NO GROWTH 5 DAYS Performed at [facility], [address], [place],  [address]    Report Status 08/31/2020 FINAL  Final  CSF culture w Gram Stain     Status: None   Collection Time: 08/28/20 12:01 PM   Specimen: PATH  Cytology CSF; Cerebrospinal Fluid  Result Value Ref Range Status   Specimen Description   Final    CSF Performed at [facility], [address], [place], [place] [address]    Special Requests   Final    NONE Performed at [facility], [place] [address], [place], [place] [address]    Gram Stain   Final    WBC SEEN RED BLOOD CELLS NO ORGANISMS SEEN Performed at [facility], [address] [place], [place] [address]    Culture   Final    NO GROWTH Performed at [facility], [place] [address], [place], [place] [address]    Report Status 08/31/2020 FINAL  Final  Culture, fungus without smear     Status: None (Preliminary result)   Collection Time: 08/28/20 12:01 PM   Specimen: PATH Cytology CSF; Cerebrospinal Fluid  Result Value Ref Range Status   Specimen Description   Final    CSF Performed at [facility], [address], [place], [place] [address]    Special Requests NONE  Final   Culture   Final    NO FUNGUS ISOLATED AFTER 5 DAYS Performed at [facility], [place] [address] [place], [place] [address]    Report Status PENDING  Incomplete    Lipid Panel No results for input(s): CHOL, TRIG, HDL, CHOLHDL, VLDL, LDLCALC in the last 72 hours.  Studies/Results: No results found.  Medications:  Scheduled: . carbidopa-levodopa  1 tablet Oral Daily  . carbidopa-levodopa  2 tablet Oral BID  . Carbidopa-Levodopa ER  1 tablet Oral TID  . Carbidopa-Levodopa ER  1 tablet Oral QHS  . Chlorhexidine Gluconate Cloth  6 each Topical Q0600  . Chlorhexidine Gluconate Cloth  6 each Topical Q0600  . vitamin B-12  1,000 mcg Oral Daily   Or  . cyanocobalamin  1,000 mcg Intramuscular Daily  . heparin injection (subcutaneous)  5,000 Units Subcutaneous Q8H  . hydrALAZINE  25 mg Oral Q6H  . Ipratropium-Albuterol  1 puff Inhalation Q6H  . lamoTRIgine  25 mg Oral BID  . propranolol ER  60 mg Oral Daily  . [START ON 09/04/2020] thiamine injection   100 mg Intravenous Daily   Continuous: . thiamine injection Stopped (09/02/20 1503)    Review of labs and studies:  CTH without contrast: CTH was negative for acute abnormality.  MRI Brain without contrast is normal.   LP with WBC count of 39, RBC count of 10. Protein of 64, glucose of 75. CSF Gram stain with no organisms.  CSF cultures negative.  EEG: This study is suggestive of moderate diffuse encephalopathy, nonspecific etiology. Given the morphology and frequency of periodic dischargeswith triphasic morphology are most likely due to toxic-metabolic causes.No seizures ordefiniteepileptiform discharges were seen throughout the recording.  Repeat rEEG: This study is suggestive of moderate diffuse encephalopathy, nonspecific etiology. No seizures orepileptiform discharges were seen throughout the recording.  Assessment: 60 year old female with waxing and waning level of consciousness varying from lethargy to somnolence to confusion with agitation, in the context of elevated serum lactate, LP findings suggestive of a meningitis and significant AKI requiring dialysis.  -  DDx for her presentation includes medication overdose, meningitis and sepsis, possibly multifactorial with a combination of the above.  - CSF gram stain negative for organisms. CSF culture started  on 2/15 is with no growth.  - Her examinations had been improving with dialysis. Now with worsened level of consciousness. Received dialysis today.   - MRI on 2/15, which was severely degraded by motion artifact, revealed no acute intracranial abnormality.  -- EEG x 2 showed findings suggestive of a moderate diffuse encephalopathy, most likely due to toxic-metabolic causes. No seizures ordefinite epileptiform discharges were seen. -- Of note, until yesterday she was receiving an unusually high total daily dose of Sinemet as follows: Controlled Release formulation 25/100 one tablet TID and two tablets QHS in addition  to Immediate Release formulation 25/100 2 tablets BID and one tablet in the evening. Per Pharmacy, this may have been due to a medication documentation error. Excess levodopa would generally be expected to result in side effect of dyskinesia, psychomotor agitation, hallucinations and tachycardia, but Pharmacy notes a prior case in which a patient on too high of a Sinemet dose experienced sensation of slowing down as if in "mud". Therefore a trial of a lower dose of Sinemet is indicated.   Recommendations: 1. Recommend lowering her total daily levodopa dose by eliminating the CR formulation and lowering IR to 3 pills per day. Would observe for possible improvement. Of note, lowering the dose is safe, but would not abruptly withdraw Sinemet as that can result in neuroleptic malignant syndrome.  2. Above has been discussed with Pharmacy. Order has been placed to decrease Sinemet IR dosage regimen to 25/100 TID, while discontinuing the CR form.  3. Continue to watch for improvement with dialysis sessions.  4. Other DDx includes dialysis disequilibrium syndrome. Would discuss with Nephrology regarding possible modifications to her dialysis protocol.  5. Blood CX was ordered by primary team today.   Addendum: - Per Pharmacy, Lamictal can in rare cases result in aseptic meningitis. Her clinical picture is not consistent with this but will consider holding Lamictal or switching to an alternate anticonvulsant if no improvement on lower dose of Sinemet.  - Information regarding indication for her Lamictal is unclear based on chart review. It may have been for epilepsy as seizures are listed in her autopopulated PMHx, but the Neurology notes from [facility] do not mention seizures (they see her for her Parkinsonism) and her mother denies a history of seizures. Alternatively, Lamictal may be for her bipolar disorder. Have messaged Pharmacy to see if they can access her prescription records to find the indication for her  Lamictal prescription.  - Chart review also reveals that her unusually high Sinemet dosing regimen was per her [facility] Neurologist who was titrating this medication for control of her worsening tremors and stiffness. Would still continue on the lower dosage and assess for improvement. Would closely monitor her temperature as withdrawal from Sinemet can result in neuroleptic malignant syndrome.  - There is also the possibility that she is presenting with catatonia, a primary neuropsychiatric disturbance which is thought to be secondary to central GABA hypoactivity. Most cases are spontaneous, but rare cases can result from withdrawal from benzodiazepines. She did have a prescription for Valium 10 mg PRN at home.  - Her mother states that she called EMS due to finding her daughter in the exact same position in bed a day after last seeing her, which made her worried that her daughter might have been having a stroke. The patient was incontinent of urine at that time, but no evidence for any movement since last seen by mother. The description provided by her mother is suggestive of acute catatonia.  -  Mother also states that she has only fragmented knowledge of her daughter's medical conditions and prescriptions, as her daughter does not like to share this information.  - CK was 364 on 2/14, trending downwards. AKI unlikely to be due to rhabdomyolysis.      LOS: 8 days   @Electronically  signed: Dr. Kerney Elbe 09/03/2020  10:45 AM

## 2020-09-03 NOTE — Progress Notes (Signed)
Patient was given morphine IV and noted with improved respirations of 16 at this time. HOB kept elevated. CHG bath completed. Foley cath care provided in accordance with [facility] protocols. Unable to give PO medication due to lethargy. Will endorse.

## 2020-09-03 NOTE — Progress Notes (Signed)
Patient unable to be dialyzed due to catheter being clotted. Attempted several times and was unsuccessful. Dr. Candiss Norse made aware. Was given report that patient is schedule to receive new dialysis catheter tomorrow 09/04/2020.

## 2020-09-03 NOTE — Progress Notes (Signed)
Patient was unable to run due to arterial pressure  constantly going up. Tried flushing and reversing blood lines and was still unsuccessful. RN notified doctor and doctor stated patient will be getting a permcath some time today

## 2020-09-03 NOTE — Progress Notes (Signed)
Notified bedside nurse of need to order antibiotics.

## 2020-09-03 NOTE — H&P (View-Only) (Signed)
 Vein & Vascular Surgery Daily Progress Note  Subjective: Patient extremely lethargic.  Opens eyes to verbal stimuli.  Objective: Vitals:   09/03/20 0150 09/03/20 0604 09/03/20 0800 09/03/20 1000  BP:  (!) 157/91 (!) 164/87 (!) 164/94  Pulse:  96 98 (!) 101  Resp: (!) 29 16 (!) 22   Temp:  98.4 F (36.9 C) 98.3 F (36.8 C) 98.4 F (36.9 C)  TempSrc:  Oral Oral   SpO2:  94% 94% 95%  Weight:      Height:        Intake/Output Summary (Last 24 hours) at 09/03/2020 1055 Last data filed at 09/03/2020 0811 Gross per 24 hour  Intake 42.85 ml  Output 775 ml  Net -732.15 ml   Physical Exam: A&Ox1, NAD CV: RRR Pulmonary: CTA Bilaterally Abdomen: Soft, Nontender, Nondistended Vascular: Warm distally toes   Laboratory: CBC    Component Value Date/Time   WBC 8.4 09/03/2020 0529   HGB 13.5 09/03/2020 0529   HGB 13.1 03/06/2013 2254   HGB 11.3 (L) 02/26/2006 1125   HCT 42.8 09/03/2020 0529   HCT 37.4 03/06/2013 2254   HCT 34.2 (L) 02/26/2006 1125   PLT 308 09/03/2020 0529   PLT 304 03/06/2013 2254   PLT 367 02/26/2006 1125   BMET    Component Value Date/Time   NA 140 09/03/2020 0529   NA 134 (L) 03/06/2013 2254   K 5.1 09/03/2020 0529   K 4.0 03/06/2013 2254   CL 100 09/03/2020 0529   CL 101 03/06/2013 2254   CO2 19 (L) 09/03/2020 0529   CO2 30 03/06/2013 2254   GLUCOSE 77 09/03/2020 0529   GLUCOSE 84 03/06/2013 2254   BUN 63 (H) 09/03/2020 0529   BUN 13 03/06/2013 2254   CREATININE 7.97 (H) 09/03/2020 0529   CREATININE 0.67 03/06/2013 2254   CALCIUM 9.0 09/03/2020 0529   CALCIUM 9.0 03/06/2013 2254   GFRNONAA 5 (L) 09/03/2020 0529   GFRNONAA >60 03/06/2013 2254   GFRAA >60 03/06/2013 2254   Assessment/Planning: The patient is a 60 year old female with multiple medical issues admitted with possible sepsis/overdose  1) Vascular surgery was consulted by patient's primary team for insertion of a PermCath. Patient currently has a temporary dialysis  catheter however will need long-term hemodialysis.  Insertion of a PermCath to allow her to dialyze in the outpatient inpatient setting. Will plan on insertion of PermCath tomorrow  Marcelle Overlie PA-C 09/03/2020 10:55 AM

## 2020-09-03 NOTE — Progress Notes (Signed)
SLP Cancellation Note  Patient Details Name: RAGINA FENTER MRN: [id number] DOB: 1961-03-19   Cancelled treatment:       Reason Eval/Treat Not Completed: Patient's level of consciousness;Fatigue/lethargy limiting ability to participate;Medical issues which prohibited therapy (chart reviewed; consulted NSG re: pt's status). Pt is currently drowsy/lethargic and only opens eyes briefly to verbal/tactile stim. She does not alert/engage sufficiently for safer oral intake/po trials. NSG held Lunch meal d/t lethargy/drowsiness. Reviewed chart notes noting pt's Parkinson's Dis and Sinemet orders; as well as other medical history. Pt was followed by Palliative Care at Home prior to admit.  ST services will f/u tomorrow w/ pt's status and toleration of oral diet. Recommend aspiration precautions and Pills in Puree for safer swallowing -- no oral intake unless fully alert/awake. Frequent oral care. NSG agreed.      Orinda Kenner, MS, CCC-SLP Speech Language Pathologist Rehab Services [phone] [facility] 09/03/2020, 1:54 PM

## 2020-09-03 NOTE — Progress Notes (Signed)
   09/02/20 2356  Assess: MEWS Score  Temp 98.6 F (37 C)  BP (!) 166/87  Pulse Rate (!) 103  ECG Heart Rate (!) 104  Resp (!) 28  Level of Consciousness Responds to Voice  SpO2 100 %  O2 Device Room Air  Patient Activity (if Appropriate) In bed  O2 Flow Rate (L/min) 0 L/min  Assess: MEWS Score  MEWS Temp 0  MEWS Systolic 0  MEWS Pulse 1  MEWS RR 2  MEWS LOC 1  MEWS Score 4  MEWS Score Color Red  Assess: if the MEWS score is Yellow or Red  Were vital signs taken at a resting state? Yes  Focused Assessment Change from prior assessment (see assessment flowsheet)  Early Detection of Sepsis Score *See Row Information* Medium  MEWS guidelines implemented *See Row Information* Yes  Treat  MEWS Interventions Administered prn meds/treatments;Escalated (See documentation below)  Pain Scale PAINAD  Breathing 2  Negative Vocalization 1  Facial Expression 1  Body Language 0  Consolability 1  PAINAD Score 5  Facial Expression 0  Body Movements 0  Muscle Tension 0  Compliance with ventilator (intubated pts.) N/A  Vocalization (extubated pts.) 0  CPOT Total 0  Take Vital Signs  Increase Vital Sign Frequency  Yellow: Q 2hr X 2 then Q 4hr X 2, if remains yellow, continue Q 4hrs  Escalate  MEWS: Escalate Yellow: discuss with charge nurse/RN and consider discussing with provider and RRT  Notify: Charge Nurse/RN  Name of Charge Nurse/RN Notified Gilmore Laroche  Date Charge Nurse/RN Notified 09/03/20  Time Charge Nurse/RN Notified 0013  Notify: Provider  Provider Name/Title Ouma NP  Date Provider Notified 09/03/20  Time Provider Notified 0000  Notification Type  (secure chat)  Notification Reason Change in status  Provider response Other (Comment) (awaituing)

## 2020-09-03 NOTE — Consult Note (Signed)
[facility] Consult   Reason for Consult: Follow-up patient with delirium and confusion after presumed overdose Referring Physician:  Manuella Ghazi Patient Identification: Stacy Pearson MRN:  [id number] Principal Diagnosis: Delirium Diagnosis:  Principal Problem:   Delirium Active Problems:   Bipolar 1 disorder ([place])   COPD (chronic obstructive pulmonary disease) (HCC)   Essential tremor   Migraine without aura   Parkinson's disease ([place])   Epilepsy ([place])   Depression with anxiety   HTN (hypertension)   Acute metabolic encephalopathy   UTI (urinary tract infection)   AKI (acute kidney injury) ([place])   Severe sepsis with septic shock ([place])   Total Time spent with patient: 15 minutes  Subjective:   Stacy Pearson is a 60 y.o. female patient admitted with nonverbal.  HPI: See previous notes.  She is out of the ICU now but no more responsive.  Came to see her this evening.  Eyes were open.  Ask her if she could close them if she could hear my voice.  She did not do so.  Did not appear to be responding at all to me or to hear me.  Not moving.  Past Psychiatric History: History of bipolar possible overdose  Risk to Self:   Risk to Others:   Prior Inpatient Therapy:   Prior Outpatient Therapy:    Past Medical History:  Past Medical History:  Diagnosis Date  . Abdominal pain   . Allergic genetic state   . Anxiety   . Asthma   . Basal cell carcinoma   . Bipolar disorder ([place])   . Cancer ([place])    MOS surgery basal cell face  . COPD (chronic obstructive pulmonary disease) ([place])   . Depression   . Eating disorder   . Eczema   . Encounter for blood transfusion   . Essential tremor   . 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 GERD (gastroesophageal reflux disease)   . Headache   . History of basal cell carcinoma   . Hypertension   . Neuropathy   . Parkinson's disease ([place])   . Parkinson's disease ([place])   . Parkinsonism [facility])      Past Surgical History:  Procedure Laterality Date  . ABDOMINAL HYSTERECTOMY    . ADENOIDECTOMY    . BLADDER SURGERY    . bone grafts    . CATARACT EXTRACTION W/ INTRAOCULAR LENS IMPLANT    . CERVICAL FUSION    . COLONOSCOPY    . COLONOSCOPY WITH ESOPHAGOGASTRODUODENOSCOPY (EGD)    . COLONOSCOPY WITH PROPOFOL N/A 04/28/2018   Procedure: COLONOSCOPY WITH PROPOFOL;  Surgeon: Manya Silvas, MD;  Location: [facility] ENDOSCOPY;  Service: Endoscopy;  Laterality: N/A;  . EYE SURGERY    . GRAFT APPLICATION     in neck  . LAPAROSCOPIC BILATERAL SALPINGO OOPHERECTOMY    . NEUROPLASTY / TRANSPOSITION ULNAR NERVE AT ELBOW    . OOPHORECTOMY    . SKIN CANCER EXCISION    . skin grafts   left forearm; hand bil.lower extremeties  . titanium plate    . TONSILLECTOMY    . TUBAL LIGATION     Family History:  Family History  Problem Relation Age of Onset  . Breast cancer Mother   . Lung cancer Father    Family Psychiatric  History: See previous Social History:  Social History   Substance and Sexual Activity  Alcohol Use Never     Social History   Substance and Sexual Activity  Drug Use Never    Social History   Socioeconomic History  . Marital status: Single    Spouse name: Not on file  . Number of children: Not on file  . Years of education: Not on file  . Highest education level: Not on file  Occupational History  . Not on file  Tobacco Use  . Smoking status: Former Smoker    Types: Cigarettes    Quit date: 07/14/2006    Years since quitting: 14.1  . Smokeless tobacco: Never Used  Substance and Sexual Activity  . Alcohol use: Never  . Drug use: Never  . Sexual activity: Not on file  Other Topics Concern  . Not on file  Social History Narrative  . Not on file   Social Determinants of Health   Financial Resource Strain: Not on file  Food Insecurity: Not on file  Transportation Needs: Not on file  Physical Activity: Not on file  Stress: Not on file  Social  Connections: Not on file   Additional Social History:    Allergies:   Allergies  Allergen Reactions  . Codeine Nausea And Vomiting  . Prednisone     Labs:  Results for orders placed or performed during the hospital encounter of 08/26/20 (from the past 48 hour(s))  CBC     Status: Abnormal   Collection Time: 09/02/20  4:38 AM  Result Value Ref Range   WBC 6.8 4.0 - 10.5 K/uL   RBC 3.70 (L) 3.87 - 5.11 MIL/uL   Hemoglobin 11.3 (L) 12.0 - 15.0 g/dL   HCT 33.4 (L) 36.0 - 46.0 %   MCV 90.3 80.0 - 100.0 fL   MCH 30.5 26.0 - 34.0 pg   MCHC 33.8 30.0 - 36.0 g/dL   RDW 13.5 11.5 - 15.5 %   Platelets 267 150 - 400 K/uL   nRBC 0.0 0.0 - 0.2 %    Comment: Performed at [facility], [address], [place], [place] [address]  Basic metabolic panel     Status: Abnormal   Collection Time: 09/02/20  4:38 AM  Result Value Ref Range   Sodium 137 135 - 145 mmol/L   Potassium 3.9 3.5 - 5.1 mmol/L   Chloride 98 98 - 111 mmol/L   CO2 24 22 - 32 mmol/L   Glucose, Bld 114 (H) 70 - 99 mg/dL    Comment: Glucose reference range applies only to samples taken after fasting for at least 8 hours.   BUN 54 (H) 6 - 20 mg/dL   Creatinine, Ser 6.31 (H) 0.44 - 1.00 mg/dL   Calcium 9.0 8.9 - 10.3 mg/dL   GFR, Estimated 7 (L) >60 mL/min    Comment: (NOTE) Calculated using the CKD-EPI Creatinine Equation (2021)    Anion gap 15 5 - 15    Comment: Performed at [facility], [address] [place], [place] [address]  CBC     Status: None   Collection Time: 09/03/20  5:29 AM  Result Value Ref Range   WBC 8.4 4.0 - 10.5 K/uL   RBC 4.51 3.87 - 5.11 MIL/uL   Hemoglobin 13.5 12.0 - 15.0 g/dL   HCT 42.8 36.0 - 46.0 %   MCV 94.9 80.0 - 100.0 fL   MCH 29.9 26.0 - 34.0 pg   MCHC 31.5 30.0 - 36.0 g/dL   RDW 13.9 11.5 - 15.5 %   Platelets 308 150 - 400 K/uL   nRBC 0.0 0.0 - 0.2 %  Comment: Performed at [facility], [address] [place], [place] [address]  Basic metabolic  panel     Status: Abnormal   Collection Time: 09/03/20  5:29 AM  Result Value Ref Range   Sodium 140 135 - 145 mmol/L    Comment:  ELECTROLYTES REPEATED SDR   Potassium 5.1 3.5 - 5.1 mmol/L   Chloride 100 98 - 111 mmol/L   CO2 19 (L) 22 - 32 mmol/L   Glucose, Bld 77 70 - 99 mg/dL    Comment: Glucose reference range applies only to samples taken after fasting for at least 8 hours.   BUN 63 (H) 6 - 20 mg/dL   Creatinine, Ser 7.97 (H) 0.44 - 1.00 mg/dL   Calcium 9.0 8.9 - 10.3 mg/dL   GFR, Estimated 5 (L) >60 mL/min    Comment: (NOTE) Calculated using the CKD-EPI Creatinine Equation (2021)    Anion gap 21 (H) 5 - 15    Comment: Performed at [facility], [address] [place], [place] [address]    Current Facility-Administered Medications  Medication Dose Route Frequency Provider Last Rate Last Admin  . 0.9 %  sodium chloride infusion   Intravenous PRN Max Sane, MD   Stopped at 09/03/20 1244  . acetaminophen (TYLENOL) tablet 650 mg  650 mg Oral Q6H PRN Ivor Costa, MD       Or  . acetaminophen (TYLENOL) suppository 650 mg  650 mg Rectal Q6H PRN Ivor Costa, MD      . albuterol (PROVENTIL) (2.5 MG/3ML) 0.083% nebulizer solution 2.5 mg  2.5 mg Nebulization Q4H PRN Ivor Costa, MD   2.5 mg at 09/02/20 2351  . carbidopa-levodopa (SINEMET IR) 25-100 MG per tablet immediate release 1 tablet  1 tablet Oral TID Kerney Elbe, MD      . Chlorhexidine Gluconate Cloth 2 % PADS 6 each  6 each Topical Q0600 Lorella Nimrod, MD   6 each at 08/31/20 0601  . Chlorhexidine Gluconate Cloth 2 % PADS 6 each  6 each Topical Q0600 Lavonia Dana, MD   6 each at 09/03/20 [phone]  . vitamin B-12 (CYANOCOBALAMIN) tablet 1,000 mcg  1,000 mcg Oral Daily Donnetta Simpers, MD   1,000 mcg at 09/02/20 1037   Or  . cyanocobalamin ((VITAMIN B-12)) injection 1,000 mcg  1,000 mcg Intramuscular Daily Donnetta Simpers, MD   1,000 mcg at 09/03/20 1107  . heparin injection 5,000 Units  5,000 Units Subcutaneous  Q8H Oswald Hillock, RPH   5,000 Units at 09/03/20 9509  . hydrALAZINE (APRESOLINE) injection 10 mg  10 mg Intravenous Q6H PRN Max Sane, MD   10 mg at 09/02/20 1746  . hydrALAZINE (APRESOLINE) tablet 25 mg  25 mg Oral Q6H Max Sane, MD   25 mg at 09/02/20 1741  . Ipratropium-Albuterol (COMBIVENT) respimat 1 puff  1 puff Inhalation Q6H Ivor Costa, MD   1 puff at 09/03/20 [phone]  . lamoTRIgine (LAMICTAL) tablet 25 mg  25 mg Oral BID Ivor Costa, MD   25 mg at 09/02/20 2213  . ondansetron (ZOFRAN) injection 4 mg  4 mg Intravenous Q8H PRN Ivor Costa, MD      . propranolol ER (INDERAL LA) 24 hr capsule 60 mg  60 mg Oral Daily Ivor Costa, MD   60 mg at 09/02/20 1037  . thiamine (B-1) 250 mg in sodium chloride 0.9 % 50 mL IVPB  250 mg Intravenous Daily Lorella Nimrod, MD   Stopped at 09/03/20 1139   Followed by  . [  START ON 09/04/2020] thiamine (B-1) injection 100 mg  100 mg Intravenous Daily Lorella Nimrod, MD        Musculoskeletal: Strength & Muscle Tone: atrophy Gait & Station: unable to stand Patient leans: N/A  Psychiatric Specialty Exam: Physical Exam Vitals and nursing note reviewed.  Constitutional:      Appearance: She is well-developed and well-nourished.  HENT:     Head: Normocephalic and atraumatic.  Eyes:     Conjunctiva/sclera: Conjunctivae normal.     Pupils: Pupils are equal, round, and reactive to light.  Cardiovascular:     Heart sounds: Normal heart sounds.  Pulmonary:     Effort: Pulmonary effort is normal.  Abdominal:     Palpations: Abdomen is soft.  Musculoskeletal:        General: Normal range of motion.     Cervical back: Normal range of motion.  Skin:    General: Skin is warm and dry.  Neurological:     Mental Status: She is alert.  Psychiatric:        Attention and Perception: She is inattentive.     Review of Systems  Unable to perform ROS: Patient nonverbal    Blood pressure (!) 167/87, pulse (!) 105, temperature 98.3 F (36.8 C), temperature  source Oral, resp. rate (!) 22, height 5\' 7"  (1.702 m), weight 67.1 kg, SpO2 90 %.Body mass index is 23.17 kg/m.  General Appearance: Disheveled  Eye Contact:  None  Speech:  Negative  Volume:  Decreased  Mood:  Negative  Affect:  Negative  Thought Process:  NA  Orientation:  Negative  Thought Content:  NA  Suicidal Thoughts:  No  Homicidal Thoughts:  No  Memory:  Negative  Judgement:  Negative  Insight:  Negative  Psychomotor Activity:  Negative  Concentration:  Concentration: Negative  Recall:  Negative  Fund of Knowledge:  Negative  Language:  Negative  Akathisia:  Negative  Handed:  Right  AIMS (if indicated):     Assets:  Social Support  ADL's:  Impaired  Cognition:  Impaired,  Severe  Sleep:        Treatment Plan Summary: Plan Still unresponsive.  Unclear whether there is any chance of improvement with this patient.  No active psychiatric illness.  Signing off at this point  Disposition: Patient does not meet criteria for psychiatric inpatient admission.  Alethia Berthold, MD 09/03/2020 6:01 PM

## 2020-09-03 NOTE — Progress Notes (Signed)
[facility] Daily Progress Note  Subjective: Patient extremely lethargic.  Opens eyes to verbal stimuli.  Objective: Vitals:   09/03/20 0150 09/03/20 0604 09/03/20 0800 09/03/20 1000  BP:  (!) 157/91 (!) 164/87 (!) 164/94  Pulse:  96 98 (!) 101  Resp: (!) 29 16 (!) 22   Temp:  98.4 F (36.9 C) 98.3 F (36.8 C) 98.4 F (36.9 C)  TempSrc:  Oral Oral   SpO2:  94% 94% 95%  Weight:      Height:        Intake/Output Summary (Last 24 hours) at 09/03/2020 1055 Last data filed at 09/03/2020 0811 Gross per 24 hour  Intake 42.85 ml  Output 775 ml  Net -732.15 ml   Physical Exam: A&Ox1, NAD CV: RRR Pulmonary: CTA Bilaterally Abdomen: Soft, Nontender, Nondistended Vascular: Warm distally toes   Laboratory: CBC    Component Value Date/Time   WBC 8.4 09/03/2020 0529   HGB 13.5 09/03/2020 0529   HGB 13.1 03/06/2013 2254   HGB 11.3 (L) 02/26/2006 1125   HCT 42.8 09/03/2020 0529   HCT 37.4 03/06/2013 2254   HCT 34.2 (L) 02/26/2006 1125   PLT 308 09/03/2020 0529   PLT 304 03/06/2013 2254   PLT 367 02/26/2006 1125   BMET    Component Value Date/Time   NA 140 09/03/2020 0529   NA 134 (L) 03/06/2013 2254   K 5.1 09/03/2020 0529   K 4.0 03/06/2013 2254   CL 100 09/03/2020 0529   CL 101 03/06/2013 2254   CO2 19 (L) 09/03/2020 0529   CO2 30 03/06/2013 2254   GLUCOSE 77 09/03/2020 0529   GLUCOSE 84 03/06/2013 2254   BUN 63 (H) 09/03/2020 0529   BUN 13 03/06/2013 2254   CREATININE 7.97 (H) 09/03/2020 0529   CREATININE 0.67 03/06/2013 2254   CALCIUM 9.0 09/03/2020 0529   CALCIUM 9.0 03/06/2013 2254   GFRNONAA 5 (L) 09/03/2020 0529   GFRNONAA >60 03/06/2013 2254   GFRAA >60 03/06/2013 2254   Assessment/Planning: The patient is a 60 year old female with multiple medical issues admitted with possible sepsis/overdose  1) Vascular surgery was consulted by patient's primary team for insertion of a PermCath. Patient currently has a temporary dialysis  catheter however will need long-term hemodialysis.  Insertion of a PermCath to allow her to dialyze in the outpatient inpatient setting. Will plan on insertion of PermCath tomorrow  Marcelle Overlie PA-C 09/03/2020 10:55 AM

## 2020-09-03 NOTE — Progress Notes (Signed)
[facility] notified me that antibiotics had not been ordered yet for the code sepsis work up. Notified Dr. Damita Dunnings, orders for antibiotics are not in yet due to waiting for lab and cxray results to come back.

## 2020-09-03 NOTE — Progress Notes (Signed)
Following for Code Sepsis  

## 2020-09-03 NOTE — Progress Notes (Signed)
Rapid response consult:  Proactive rounding on this patient with Yellow MEWS. Patient is non-verbal, not following commands. Febrile, tachycardic. Assisted bedside RN to place PR APAP. MD notified by bedside RN for change in vital signs. Sepsis work-up initiated. No acute need to escalate level of care at this time. RRT will continue to follow.

## 2020-09-03 NOTE — Progress Notes (Signed)
[place], [place] 09/03/20  Subjective:  [name] is a 60 y.o. white female with  bipolar, depression, anxiety, parkinson's, epilepsy, HTN, and COPD. She presents to the ED with AMS. Concern for overdose (cariprazine+alprazolam) versus urinary tract infection with sepsis.   Acute dialysis while inpatient  has completed 3 treatments  Not responsive during my initial visit. When I returned later with Dr Candiss Norse, Patient was able to open her eyes when name called No verbal responses to questions Breakfast at bedside, untouched  Foley- 725ml  Objective:  Vital signs in last 24 hours:  Temp:  [97.5 F (36.4 C)-99.1 F (37.3 C)] 98.5 F (36.9 C) (02/21 1224) Pulse Rate:  [83-103] 101 (02/21 1224) Resp:  [16-40] 24 (02/21 1224) BP: (157-184)/(82-94) 166/89 (02/21 1224) SpO2:  [94 %-100 %] 95 % (02/21 1224)  Weight change:  Filed Weights   08/27/20 0100 08/28/20 0245 08/29/20 0337  Weight: 64.1 kg 66.8 kg 67.1 kg    Intake/Output:    Intake/Output Summary (Last 24 hours) at 09/03/2020 1330 Last data filed at 09/03/2020 6270 Gross per 24 hour  Intake 42.85 ml  Output 775 ml  Net -732.15 ml    Physical Exam: General: Ill appearing, laying in bed  Eyes: Anicteric  Lungs:  Clear to auscultation  Heart: Regular rate and rhythm  Abdomen:  Soft, nontender,   Extremities:  trace peripheral edema.  Neurologic: Unable to follow commands  Skin: No lesions, dry  Access: Right femoral temp HD catheter 2/16  Foley in place  Basic Metabolic Panel:  Recent Labs  Lab 08/28/20 0902 08/29/20 1018 08/30/20 1000 08/31/20 0911 09/01/20 0531 09/02/20 0438 09/03/20 0529  NA 144 143 143 141 139 137 140  K 3.9 4.2 4.1 4.1 3.9 3.9 5.1  CL 105 104 101 100 97* 98 100  CO2 23 20* 22 20* 22 24 19*  GLUCOSE 120* 94 80 72 77 114* 77  BUN 87* 96* 77* 50* 36* 54* 63*  CREATININE 6.92* 8.43* 7.49* 6.00* 4.49* 6.31* 7.97*  CALCIUM 8.5* 8.7* 8.8* 8.9  8.9 9.0 9.0  MG  --   --   --   --  2.2  --   --   PHOS 5.8* 7.3* 7.7* 7.0* 6.9*  --   --      CBC: Recent Labs  Lab 08/30/20 1000 08/31/20 0911 09/01/20 0531 09/02/20 0438 09/03/20 0529  WBC 6.3 7.8 7.5 6.8 8.4  HGB 9.7* 10.1* 9.7* 11.3* 13.5  HCT 28.9* 31.0* 29.4* 33.4* 42.8  MCV 92.0 92.5 91.6 90.3 94.9  PLT 249 241 237 267 308      Lab Results  Component Value Date   HEPBSAG NON REACTIVE 08/29/2020   HEPBSAB NON REACTIVE 08/29/2020   HEPBIGM NON REACTIVE 08/29/2020      Microbiology:  Recent Results (from the past 240 hour(s))  Resp Panel by RT-PCR (Flu A&B, Covid) Urine, Clean Catch     Status: None   Collection Time: 08/26/20  5:08 AM   Specimen: Urine, Clean Catch; Nasopharyngeal(NP) swabs in vial transport medium  Result Value Ref Range Status   SARS Coronavirus 2 by RT PCR NEGATIVE NEGATIVE Final    Comment: (NOTE) SARS-CoV-2 target nucleic acids are NOT DETECTED.  The SARS-CoV-2 RNA is generally detectable in upper respiratory specimens during the acute phase of infection. The lowest concentration of SARS-CoV-2 viral copies this assay can detect is 138 copies/mL. A negative result does not preclude SARS-Cov-2 infection and should  not be used as the sole basis for treatment or other patient management decisions. A negative result may occur with  improper specimen collection/handling, submission of specimen other than nasopharyngeal swab, presence of viral mutation(s) within the areas targeted by this assay, and inadequate number of viral copies(<138 copies/mL). A negative result must be combined with clinical observations, patient history, and epidemiological information. The expected result is Negative.  Fact Sheet for Patients:  EntrepreneurPulse.com.au  Fact Sheet for Healthcare Providers:  IncredibleEmployment.be  This test is no t yet approved or cleared by the [place] FDA and  has been authorized for  detection and/or diagnosis of SARS-CoV-2 by FDA under an Emergency Use Authorization (EUA). This EUA will remain  in effect (meaning this test can be used) for the duration of the COVID-19 declaration under Section 564(b)(1) of the Act, 21 U.S.C.section 360bbb-3(b)(1), unless the authorization is terminated  or revoked sooner.       Influenza A by PCR NEGATIVE NEGATIVE Final   Influenza B by PCR NEGATIVE NEGATIVE Final    Comment: (NOTE) The Xpert Xpress SARS-CoV-2/FLU/RSV plus assay is intended as an aid in the diagnosis of influenza from Nasopharyngeal swab specimens and should not be used as a sole basis for treatment. Nasal washings and aspirates are unacceptable for Xpert Xpress SARS-CoV-2/FLU/RSV testing.  Fact Sheet for Patients: EntrepreneurPulse.com.au  Fact Sheet for Healthcare Providers: IncredibleEmployment.be  This test is not yet approved or cleared by the [place] FDA and has been authorized for detection and/or diagnosis of SARS-CoV-2 by FDA under an Emergency Use Authorization (EUA). This EUA will remain in effect (meaning this test can be used) for the duration of the COVID-19 declaration under Section 564(b)(1) of the Act, 21 U.S.C. section 360bbb-3(b)(1), unless the authorization is terminated or revoked.  Performed at [facility], [address], [place], [place] [address]   Urine culture     Status: None   Collection Time: 08/26/20  5:54 AM   Specimen: Urine, Clean Catch  Result Value Ref Range Status   Specimen Description   Final    URINE, CLEAN CATCH Performed at [facility], [address], [place], [place] [address]    Special Requests   Final    NONE Performed at [facility], [address], [place], [place] [address]    Culture   Final    NO GROWTH Performed at [facility], [place] [address] [place], [place] [address]    Report Status 08/27/2020 FINAL  Final   Culture, blood (routine x 2)     Status: None   Collection Time: 08/26/20  6:03 AM   Specimen: Left Antecubital; Blood  Result Value Ref Range Status   Specimen Description LEFT ANTECUBITAL  Final   Special Requests   Final    BOTTLES DRAWN AEROBIC AND ANAEROBIC Blood Culture adequate volume   Culture   Final    NO GROWTH 5 DAYS Performed at [facility], [address] [place], [place] [address]    Report Status 08/31/2020 FINAL  Final  Culture, blood (routine x 2)     Status: None   Collection Time: 08/26/20  8:55 AM   Specimen: BLOOD LEFT FOREARM  Result Value Ref Range Status   Specimen Description BLOOD LEFT FOREARM  Final   Special Requests   Final    BOTTLES DRAWN AEROBIC AND ANAEROBIC Blood Culture adequate volume   Culture   Final    NO GROWTH 5 DAYS Performed  at [facility], [address] [place], [place] [address]    Report Status 08/31/2020 FINAL  Final  CSF culture w Gram Stain     Status: None   Collection Time: 08/28/20 12:01 PM   Specimen: PATH Cytology CSF; Cerebrospinal Fluid  Result Value Ref Range Status   Specimen Description   Final    CSF Performed at [facility], [address] [place], [place] [address]    Special Requests   Final    NONE Performed at [facility], [place] [address], [place], [place] [address]    Gram Stain   Final    WBC SEEN RED BLOOD CELLS NO ORGANISMS SEEN Performed at [facility], [address], [place], [place] [address]    Culture   Final    NO GROWTH Performed at [facility], [place] [address], [place], [place] [address]    Report Status 08/31/2020 FINAL  Final  Culture, fungus without smear     Status: None (Preliminary result)   Collection Time: 08/28/20 12:01 PM   Specimen: PATH Cytology CSF; Cerebrospinal Fluid  Result Value Ref Range Status   Specimen Description   Final    CSF Performed at [facility], [address] [place], [place]  [address]    Special Requests NONE  Final   Culture   Final    NO FUNGUS ISOLATED AFTER 5 DAYS Performed at [facility], [place] [address] [place], [place] [address]    Report Status PENDING  Incomplete    Coagulation Studies: No results for input(s): LABPROT, INR in the last 72 hours.  Urinalysis: No results for input(s): COLORURINE, LABSPEC, PHURINE, GLUCOSEU, HGBUR, BILIRUBINUR, KETONESUR, PROTEINUR, UROBILINOGEN, NITRITE, LEUKOCYTESUR in the last 72 hours.  Invalid input(s): APPERANCEUR    Imaging: No results found.   Medications:   . sodium chloride 250 mL (09/03/20 1105)  . thiamine injection 250 mg (09/03/20 1106)   . carbidopa-levodopa  1 tablet Oral Daily  . carbidopa-levodopa  2 tablet Oral BID  . Carbidopa-Levodopa ER  1 tablet Oral TID  . Carbidopa-Levodopa ER  1 tablet Oral QHS  . Chlorhexidine Gluconate Cloth  6 each Topical Q0600  . Chlorhexidine Gluconate Cloth  6 each Topical Q0600  . vitamin B-12  1,000 mcg Oral Daily   Or  . cyanocobalamin  1,000 mcg Intramuscular Daily  . heparin injection (subcutaneous)  5,000 Units Subcutaneous Q8H  . hydrALAZINE  25 mg Oral Q6H  . Ipratropium-Albuterol  1 puff Inhalation Q6H  . lamoTRIgine  25 mg Oral BID  . propranolol ER  60 mg Oral Daily  . [START ON 09/04/2020] thiamine injection  100 mg Intravenous Daily   sodium chloride, acetaminophen **OR** acetaminophen, albuterol, hydrALAZINE, ondansetron (ZOFRAN) IV  Assessment/ Plan:  60 y.o. female with PMHX of bipolar, depression, anxiety, parkinson's, epilepsy, HTN, and COPD was admitted on 08/26/2020   Principal Problem:   Delirium Active Problems:   Bipolar 1 disorder (HCC)   COPD (chronic obstructive pulmonary disease) (HCC)   Essential tremor   Migraine without aura   Parkinson's disease ([place])   Epilepsy ([place])   Depression with anxiety   HTN (hypertension)   Acute metabolic encephalopathy   UTI (urinary tract infection)   AKI (acute kidney injury)  ([place])   Severe sepsis with septic shock (HCC)  Confusion [R41.0] Severe sepsis with septic shock ([place]) [A41.9, R65.21] Severe sepsis ([place]) [A41.9, R65.20] Altered mental status, unspecified altered mental  status type [R41.82] Sepsis, due to unspecified organism, unspecified whether acute organ dysfunction present ([place]) [A41.9]  #. Acute kidney injury with hyperkalemia and metabolic acidosis: baseline creatinine 0.8 with normal GFR on 01/31/2020 - monitor volume status.   02/20 0701 - 02/21 0700 In: 42.9 [IV Piggyback:42.9] Out: 775 [Urine:775]   Lab Results  Component Value Date   CREATININE 7.97 (H) 09/03/2020   CREATININE 6.31 (H) 09/02/2020   CREATININE 4.49 (H) 09/01/2020   Creatinine continues to worsens Potassium currently 5.1 Mental status declined Dialysis scheduled today Will continue to monitor daily Will discuss permcath placement with vascular    # Anion gap metabolic acidosis: Salicylate levels negative, acetaminophen levels negative. Normal glucose. Lactic acidosis has improved.  Most likely due to uremia/AKI    # Hypernatremia: secondary to free water deficit.  - improved  -continues to remain stable   LOS: [place] 2/21/20221:30 PM  [place], [place]

## 2020-09-03 NOTE — Progress Notes (Signed)
   09/03/20 2035  Assess: MEWS Score  Temp (!) 101.3 F (38.5 C)  BP (!) 160/81  Pulse Rate (!) 116  Resp (!) 28  Level of Consciousness Responds to Voice  SpO2 94 %  O2 Device Room Air  Assess: MEWS Score  MEWS Temp 1  MEWS Systolic 0  MEWS Pulse 2  MEWS RR 2  MEWS LOC 1  MEWS Score 6  MEWS Score Color Red  Assess: if the MEWS score is Yellow or Red  Were vital signs taken at a resting state? Yes  Focused Assessment No change from prior assessment  Early Detection of Sepsis Score *See Row Information* Medium  MEWS guidelines implemented *See Row Information* Yes  Treat  MEWS Interventions Administered prn meds/treatments  Take Vital Signs  Increase Vital Sign Frequency  Red: Q 1hr X 4 then Q 4hr X 4, if remains red, continue Q 4hrs  Escalate  MEWS: Escalate Red: discuss with charge nurse/RN and provider, consider discussing with RRT  Notify: Charge Nurse/RN  Name of Charge Nurse/RN Notified Marcella, RN  Date Charge Nurse/RN Notified 09/03/20  Time Charge Nurse/RN Notified 2035  Notify: Provider  Provider Name/Title Dr. Damita Dunnings  Date Provider Notified 09/03/20  Time Provider Notified 2035  Notification Type Page  Notification Reason Change in status  Provider response See new orders (labs, chest xray, bolus)  Date of Provider Response 09/03/20  Time of Provider Response 2040  Notify: Rapid Response  Name of Rapid Response RN Notified Dillion White, RN (ICU charge made aware during rounds)

## 2020-09-04 ENCOUNTER — Encounter
Admission: EM | Disposition: A | Discharge status: Home Health | Payer: Self-pay | Source: Home / Self Care | Attending: Internal Medicine

## 2020-09-04 DIAGNOSIS — R509 Fever, unspecified: Secondary | ICD-10-CM | POA: Diagnosis not present

## 2020-09-04 DIAGNOSIS — G9341 Metabolic encephalopathy: Secondary | ICD-10-CM | POA: Diagnosis not present

## 2020-09-04 DIAGNOSIS — Z992 Dependence on renal dialysis: Secondary | ICD-10-CM

## 2020-09-04 DIAGNOSIS — N179 Acute kidney failure, unspecified: Secondary | ICD-10-CM | POA: Diagnosis not present

## 2020-09-04 DIAGNOSIS — F319 Bipolar disorder, unspecified: Secondary | ICD-10-CM | POA: Diagnosis not present

## 2020-09-04 DIAGNOSIS — N185 Chronic kidney disease, stage 5: Secondary | ICD-10-CM

## 2020-09-04 HISTORY — PX: DIALYSIS/PERMA CATHETER INSERTION: CATH118288

## 2020-09-04 LAB — BASIC METABOLIC PANEL
Anion gap: 19 — ABNORMAL HIGH (ref 5–15)
BUN: 79 mg/dL — ABNORMAL HIGH (ref 6–20)
CO2: 22 mmol/L (ref 22–32)
Calcium: 9.1 mg/dL (ref 8.9–10.3)
Chloride: 101 mmol/L (ref 98–111)
Creatinine, Ser: 10.02 mg/dL — ABNORMAL HIGH (ref 0.44–1.00)
GFR, Estimated: 4 mL/min — ABNORMAL LOW (ref >60)
Glucose, Bld: 83 mg/dL (ref 70–99)
Potassium: 4.9 mmol/L (ref 3.5–5.1)
Sodium: 142 mmol/L (ref 135–145)

## 2020-09-04 LAB — CK: Total CK: 49 U/L (ref 38–234)

## 2020-09-04 SURGERY — DIALYSIS/PERMA CATHETER INSERTION
Anesthesia: Moderate Sedation | Laterality: Right

## 2020-09-04 MED ORDER — FENTANYL CITRATE (PF) 100 MCG/2ML IJ SOLN
INTRAMUSCULAR | Status: DC | PRN
  Administered 2020-09-04: 25 ug via INTRAVENOUS

## 2020-09-04 MED ORDER — SODIUM CHLORIDE 0.9 % IV SOLN
1.0000 g | INTRAVENOUS | Status: DC
  Administered 2020-09-05: 1 g via INTRAVENOUS
  Filled 2020-09-04 (×2): qty 1

## 2020-09-04 MED ORDER — CEFAZOLIN SODIUM-DEXTROSE 1-4 GM/50ML-% IV SOLN
1.0000 g | Freq: Once | INTRAVENOUS | Status: AC
  Administered 2020-09-04: 1 g via INTRAVENOUS

## 2020-09-04 MED ORDER — DIPHENHYDRAMINE HCL 50 MG/ML IJ SOLN: 50.0000 mg | Freq: Once | INTRAMUSCULAR | Status: DC | PRN

## 2020-09-04 MED ORDER — VANCOMYCIN HCL 1500 MG/300ML IV SOLN
1500.0000 mg | Freq: Once | INTRAVENOUS | Status: DC
  Filled 2020-09-04: qty 300

## 2020-09-04 MED ORDER — SODIUM CHLORIDE 0.9 % IV SOLN
2.0000 g | Freq: Once | INTRAVENOUS | Status: AC
  Administered 2020-09-04: 2 g via INTRAVENOUS
  Filled 2020-09-04: qty 2

## 2020-09-04 MED ORDER — MIDAZOLAM HCL 2 MG/2ML IJ SOLN
INTRAMUSCULAR | Status: DC | PRN
  Administered 2020-09-04: 0.5 mg via INTRAVENOUS

## 2020-09-04 MED ORDER — ONDANSETRON HCL 4 MG/2ML IJ SOLN: 4.0000 mg | Freq: Four times a day (QID) | INTRAMUSCULAR | Status: DC | PRN

## 2020-09-04 MED ORDER — MIDAZOLAM HCL 2 MG/ML PO SYRP: 8.0000 mg | ORAL_SOLUTION | Freq: Once | ORAL | Status: DC | PRN

## 2020-09-04 MED ORDER — SODIUM CHLORIDE 0.9 % IV SOLN: INTRAVENOUS | Status: DC

## 2020-09-04 MED ORDER — FAMOTIDINE 20 MG PO TABS: 40.0000 mg | ORAL_TABLET | Freq: Once | ORAL | Status: DC | PRN

## 2020-09-04 SURGICAL SUPPLY — 13 items
ADH SKN CLS APL DERMABOND .7 (GAUZE/BANDAGES/DRESSINGS) ×1
CANNULA 5F STIFF (CANNULA) ×1 IMPLANT
CATH PALINDROME-SP 14.5FX23 (CATHETERS) ×1 IMPLANT
COVER PROBE U/S 5X48 (MISCELLANEOUS) ×1 IMPLANT
DERMABOND ADVANCED (GAUZE/BANDAGES/DRESSINGS) ×1
DERMABOND ADVANCED .7 DNX12 (GAUZE/BANDAGES/DRESSINGS) IMPLANT
SUT MNCRL 4-0 (SUTURE) ×2
SUT MNCRL 4-0 27XMFL (SUTURE) ×1
SUT VIC AB 3-0 SH 27 (SUTURE) ×2
SUT VIC AB 3-0 SH 27X BRD (SUTURE) IMPLANT
SUTURE MNCRL 4-0 27XMF (SUTURE) IMPLANT
TOWEL OR 17X26 4PK STRL BLUE (TOWEL DISPOSABLE) ×1 IMPLANT
WIRE GUIDERIGHT .035X150 (WIRE) ×1 IMPLANT

## 2020-09-04 NOTE — Progress Notes (Signed)
[place], [place] 09/04/20  Subjective:  Ms. ROSAMARIA DONN is a 60 y.o. white female with  bipolar, depression, anxiety, parkinson's, epilepsy, HTN, and COPD. She presents to the ED with AMS. Concern for overdose (cariprazine+alprazolam) versus urinary tract infection with sepsis.   Acute dialysis while inpatient  has completed 3 treatments  Patient able to open eyes during visit today Reports from nursing of drooling that started last night Unable to answer questions  Foley- 31ml- found out by nursing  Objective:  Vital signs in last 24 hours:  Temp:  [97.8 F (36.6 C)-101.3 F (38.5 C)] 98.6 F (37 C) (02/22 1401) Pulse Rate:  [98-121] 98 (02/22 1401) Resp:  [20-34] 21 (02/22 1401) BP: (147-167)/(73-87) 147/80 (02/22 1401) SpO2:  [90 %-95 %] 95 % (02/22 1401)  Weight change:  Filed Weights   08/27/20 0100 08/28/20 0245 08/29/20 0337  Weight: 64.1 kg 66.8 kg 67.1 kg    Intake/Output:    Intake/Output Summary (Last 24 hours) at 09/04/2020 1404 Last data filed at 09/04/2020 0358 Gross per 24 hour  Intake 61.01 ml  Output -337 ml  Net 398.01 ml    Physical Exam: General: Ill appearing, laying in bed  Eyes: Anicteric  Lungs:  Clear to auscultation  Heart: Regular rate and rhythm  Abdomen:  Soft, nontender,   Extremities:  + peripheral edema.  Neurologic: Unable to follow commands  Skin: No lesions, dry  Access: Right femoral temp HD catheter 2/16  Foley in place  Basic Metabolic Panel:  Recent Labs  Lab 08/29/20 1018 08/30/20 1000 08/31/20 0911 09/01/20 0531 09/02/20 0438 09/03/20 0529 09/03/20 2054 09/04/20 0509  NA 143 143 141 139 137 140 140 142  K 4.2 4.1 4.1 3.9 3.9 5.1 5.0 4.9  CL 104 101 100 97* 98 100 100 101  CO2 20* 22 20* 22 24 19* 18* 22  GLUCOSE 94 80 72 77 114* 77 85 83  BUN 96* 77* 50* 36* 54* 63* 72* 79*  CREATININE 8.43* 7.49* 6.00* 4.49* 6.31* 7.97* 9.14* 10.02*  CALCIUM 8.7* 8.8* 8.9 8.9 9.0 9.0  8.9 9.1  MG  --   --   --  2.2  --   --   --   --   PHOS 7.3* 7.7* 7.0* 6.9*  --   --   --   --      CBC: Recent Labs  Lab 08/31/20 0911 09/01/20 0531 09/02/20 0438 09/03/20 0529 09/03/20 2054  WBC 7.8 7.5 6.8 8.4 7.7  NEUTROABS  --   --   --   --  6.0  HGB 10.1* 9.7* 11.3* 13.5 10.2*  HCT 31.0* 29.4* 33.4* 42.8 31.3*  MCV 92.5 91.6 90.3 94.9 91.5  PLT 241 237 267 308 340      Lab Results  Component Value Date   HEPBSAG NON REACTIVE 08/29/2020   HEPBSAB NON REACTIVE 08/29/2020   HEPBIGM NON REACTIVE 08/29/2020      Microbiology:  Recent Results (from the past 240 hour(s))  Resp Panel by RT-PCR (Flu A&B, Covid) Urine, Clean Catch     Status: None   Collection Time: 08/26/20  5:08 AM   Specimen: Urine, Clean Catch; Nasopharyngeal(NP) swabs in vial transport medium  Result Value Ref Range Status   SARS Coronavirus 2 by RT PCR NEGATIVE NEGATIVE Final    Comment: (NOTE) SARS-CoV-2 target nucleic acids are NOT DETECTED.  The SARS-CoV-2 RNA is generally detectable in upper respiratory specimens during the acute phase  of infection. The lowest concentration of SARS-CoV-2 viral copies this assay can detect is 138 copies/mL. A negative result does not preclude SARS-Cov-2 infection and should not be used as the sole basis for treatment or other patient management decisions. A negative result may occur with  improper specimen collection/handling, submission of specimen other than nasopharyngeal swab, presence of viral mutation(s) within the areas targeted by this assay, and inadequate number of viral copies(<138 copies/mL). A negative result must be combined with clinical observations, patient history, and epidemiological information. The expected result is Negative.  Fact Sheet for Patients:  EntrepreneurPulse.com.au  Fact Sheet for Healthcare Providers:  IncredibleEmployment.be  This test is no t yet approved or cleared by the  [place] FDA and  has been authorized for detection and/or diagnosis of SARS-CoV-2 by FDA under an Emergency Use Authorization (EUA). This EUA will remain  in effect (meaning this test can be used) for the duration of the COVID-19 declaration under Section 564(b)(1) of the Act, 21 U.S.C.section 360bbb-3(b)(1), unless the authorization is terminated  or revoked sooner.       Influenza A by PCR NEGATIVE NEGATIVE Final   Influenza B by PCR NEGATIVE NEGATIVE Final    Comment: (NOTE) The Xpert Xpress SARS-CoV-2/FLU/RSV plus assay is intended as an aid in the diagnosis of influenza from Nasopharyngeal swab specimens and should not be used as a sole basis for treatment. Nasal washings and aspirates are unacceptable for Xpert Xpress SARS-CoV-2/FLU/RSV testing.  Fact Sheet for Patients: EntrepreneurPulse.com.au  Fact Sheet for Healthcare Providers: IncredibleEmployment.be  This test is not yet approved or cleared by the [place] FDA and has been authorized for detection and/or diagnosis of SARS-CoV-2 by FDA under an Emergency Use Authorization (EUA). This EUA will remain in effect (meaning this test can be used) for the duration of the COVID-19 declaration under Section 564(b)(1) of the Act, 21 U.S.C. section 360bbb-3(b)(1), unless the authorization is terminated or revoked.  Performed at [facility], [address], [place], [place] [address]   Urine culture     Status: None   Collection Time: 08/26/20  5:54 AM   Specimen: Urine, Clean Catch  Result Value Ref Range Status   Specimen Description   Final    URINE, CLEAN CATCH Performed at [facility], [address] [place], [place] [address]    Special Requests   Final    NONE Performed at [facility], [address], [place], [place] [address]    Culture   Final    NO GROWTH Performed at [facility], [place] [address], [place], [place]  [address]    Report Status 08/27/2020 FINAL  Final  Culture, blood (routine x 2)     Status: None   Collection Time: 08/26/20  6:03 AM   Specimen: Left Antecubital; Blood  Result Value Ref Range Status   Specimen Description LEFT ANTECUBITAL  Final   Special Requests   Final    BOTTLES DRAWN AEROBIC AND ANAEROBIC Blood Culture adequate volume   Culture   Final    NO GROWTH 5 DAYS Performed at [facility], [address] [place],  [address]    Report Status 08/31/2020 FINAL  Final  Culture, blood (routine x 2)     Status: None   Collection Time: 08/26/20  8:55 AM   Specimen: BLOOD LEFT FOREARM  Result Value Ref Range Status   Specimen Description BLOOD LEFT FOREARM  Final   Special Requests   Final  BOTTLES DRAWN AEROBIC AND ANAEROBIC Blood Culture adequate volume   Culture   Final    NO GROWTH 5 DAYS Performed at [facility], [address] [place], [place] [address]    Report Status 08/31/2020 FINAL  Final  CSF culture w Gram Stain     Status: None   Collection Time: 08/28/20 12:01 PM   Specimen: PATH Cytology CSF; Cerebrospinal Fluid  Result Value Ref Range Status   Specimen Description   Final    CSF Performed at [facility], [address], [place], [place] [address]    Special Requests   Final    NONE Performed at [facility], [place] [address] [place], [place] [address]    Gram Stain   Final    WBC SEEN RED BLOOD CELLS NO ORGANISMS SEEN Performed at [facility], [address], [place], [place] [address]    Culture   Final    NO GROWTH Performed at [facility], [place] [address], [place], [place] [address]    Report Status 08/31/2020 FINAL  Final  Culture, fungus without smear     Status: None (Preliminary result)   Collection Time: 08/28/20 12:01 PM   Specimen: PATH Cytology CSF; Cerebrospinal Fluid  Result Value Ref Range Status   Specimen Description   Final    CSF Performed at [facility], [address] [place], [place] [address]    Special Requests NONE  Final   Culture   Final    NO FUNGUS ISOLATED AFTER 8 DAYS Performed at [facility], [place] [address], [place], [place] [address]    Report Status PENDING  Incomplete  Culture, blood (x 2)     Status: None (Preliminary result)   Collection Time: 09/03/20  8:54 PM   Specimen: BLOOD  Result Value Ref Range Status   Specimen Description BLOOD BLOOD RIGHT FOREARM  Final   Special Requests   Final    BOTTLES DRAWN AEROBIC AND ANAEROBIC Blood Culture adequate volume   Culture   Final    NO GROWTH < 12 HOURS Performed at [facility], [address] [place], [place] [address]    Report Status PENDING  Incomplete  Culture, blood (x 2)     Status: None (Preliminary result)   Collection Time: 09/03/20  8:56 PM   Specimen: BLOOD  Result Value Ref Range Status   Specimen Description BLOOD RIGHT ANTECUBITAL  Final   Special Requests   Final    BOTTLES DRAWN AEROBIC AND ANAEROBIC Blood Culture adequate volume   Culture   Final    NO GROWTH < 12 HOURS Performed at [facility], [address] [place], [place] [address]    Report Status PENDING  Incomplete    Coagulation Studies: Recent Labs    09/03/20 11-Oct-2052  LABPROT 14.9  INR 1.2    Urinalysis: Recent Labs    09/03/20 2300  COLORURINE YELLOW*  LABSPEC 1.009  PHURINE 5.0  GLUCOSEU NEGATIVE  HGBUR SMALL*  BILIRUBINUR NEGATIVE  KETONESUR 5*  PROTEINUR >=300*  NITRITE NEGATIVE  LEUKOCYTESUR SMALL*      Imaging: DG Chest Port 1 View  Result Date: 09/03/2020 CLINICAL DATA:  Sepsis EXAM: PORTABLE CHEST 1 VIEW COMPARISON:  08/29/2020 FINDINGS: Surgical hardware in the cervical spine. No focal consolidation or effusion. Stable cardiomediastinal silhouette. No pneumothorax. IMPRESSION: No active disease. Electronically Signed   By: Donavan Foil M.D.   On: 09/03/2020 21:28  Medications:   . sodium chloride  Stopped (09/03/20 1244)   . carbidopa-levodopa  1 tablet Oral TID  . Chlorhexidine Gluconate Cloth  6 each Topical Q0600  . vitamin B-12  1,000 mcg Oral Daily   Or  . cyanocobalamin  1,000 mcg Intramuscular Daily  . heparin injection (subcutaneous)  5,000 Units Subcutaneous Q8H  . hydrALAZINE  25 mg Oral Q6H  . Ipratropium-Albuterol  1 puff Inhalation Q6H  . lamoTRIgine  25 mg Oral BID  . propranolol ER  60 mg Oral Daily   sodium chloride, acetaminophen **OR** acetaminophen, albuterol, hydrALAZINE, ondansetron (ZOFRAN) IV  Assessment/ Plan:  60 y.o. female with PMHX of bipolar, depression, anxiety, parkinson's, epilepsy, HTN, and COPD was admitted on 08/26/2020   Principal Problem:   Delirium Active Problems:   Bipolar 1 disorder (HCC)   COPD (chronic obstructive pulmonary disease) (HCC)   Essential tremor   Migraine without aura   Parkinson's disease ([place])   Epilepsy ([place])   Depression with anxiety   HTN (hypertension)   Acute metabolic encephalopathy   UTI (urinary tract infection)   AKI (acute kidney injury) ([place])   Severe sepsis with septic shock (HCC)  Confusion [R41.0] Severe sepsis with septic shock ([place]) [A41.9, R65.21] Severe sepsis ([place]) [A41.9, R65.20] Altered mental status, unspecified altered mental status type [R41.82] Sepsis, due to unspecified organism, unspecified whether acute organ dysfunction present ([place]) [A41.9]  #. Acute kidney injury with hyperkalemia and metabolic acidosis: baseline creatinine 0.8 with normal GFR on 01/31/2020 - monitor volume status.   02/21 0701 - 02/22 0700 In: 61 [I.V.:11; IV Piggyback:50] Out: -337 [Urine:75]   Lab Results  Component Value Date   CREATININE 10.02 (H) 09/04/2020   CREATININE 9.14 (H) 09/03/2020   CREATININE 7.97 (H) 09/03/2020   Creatinine continues to worsen Potassium currently 4.9 Unable to perform dialysis yesterday due nonfunctioning cath Permcath placement ordered today Once placed, will dialyze  today Will continue to monitor daily    # Anion gap metabolic acidosis: Salicylate levels negative, acetaminophen levels negative. Normal glucose. Lactic acidosis has improved.  Most likely due to uremia/AKI    # Hypernatremia: secondary to free water deficit.  - improved  -continues to remain stable   LOS: [place] 2/22/20222:04 PM  [place], [place]

## 2020-09-04 NOTE — Progress Notes (Signed)
ID Back on her case as she had fever yesterday.    On calling her name patient opens her eyes. When I asked her whether she took any antifreeze she said no vehemently. Her eyes were  fixed but not focused She was not able to say clearly whether she was able to see She was moving her lower limbs spontaneously but her upper limbs were not Chest bilateral air entry Heart sound S1-S2  abdomen soft Right femoral dialysis catheter Foley catheter   Labs CBC Latest Ref Rng & Units 09/03/2020 09/03/2020 09/02/2020  WBC 4.0 - 10.5 K/uL 7.7 8.4 6.8  Hemoglobin 12.0 - 15.0 g/dL 10.2(L) 13.5 11.3(L)  Hematocrit 36.0 - 46.0 % 31.3(L) 42.8 33.4(L)  Platelets 150 - 400 K/uL 340 308 267    CMP Latest Ref Rng & Units 09/04/2020 09/03/2020 09/03/2020  Glucose 70 - 99 mg/dL 83 85 77  BUN 6 - 20 mg/dL 79(H) 72(H) 63(H)  Creatinine 0.44 - 1.00 mg/dL 10.02(H) 9.14(H) 7.97(H)  Sodium 135 - 145 mmol/L 142 140 140  Potassium 3.5 - 5.1 mmol/L 4.9 5.0 5.1  Chloride 98 - 111 mmol/L 101 100 100  CO2 22 - 32 mmol/L 22 18(L) 19(L)  Calcium 8.9 - 10.3 mg/dL 9.1 8.9 9.0  Total Protein 6.5 - 8.1 g/dL - 7.0 -  Total Bilirubin 0.3 - 1.2 mg/dL - 1.2 -  Alkaline Phos 38 - 126 U/L - 72 -  AST 15 - 41 U/L - 11(L) -  ALT 0 - 44 U/L - <5 -  Micro Blood culture sent on 09/03/2020 CSF culture 08/28/2020 no growth Blood culture 2 3022 no growth Urine culture 08/26/2020 no growth  CSF herpes DNA negative VDRL CSF negative HIV nonreactive   Impression/recommendation  Acute metabolic encephalopathy,: Medication versus infection CSF examination was mild neutrophilic pleocytosis.  But negative culture, negative HSV, negative VDRL negative CSF bio fire still pending Patient initially was on antibiotics and antiviral agent but stopped when cultures came back negative on 08/30/2020. her picture was thought to be secondary to medications cariprazine plus Valium plus high dose of Parkinson's medication  Her mental status  improved after dialysis was started.  But it fluctuates . Currently she is minimally verbal She moves her lower limbs spontaneously but not her upper limbs Unable to assess her vision   Acute kidney injury needing dialysis unclear etiology could be medication  New fever Remove right femoral dialysis catheter and Foley catheter Cultures have been sent We will start vancomycin and cefepime Observe closely while on cefepime for any worsening of encephalopathy and low threshold for stopping cefepime  Bipolar disorder  Anemia  Hypoalbuminemia  Discussed the management with the care team.

## 2020-09-04 NOTE — Progress Notes (Signed)
SLP Cancellation Note  Patient Details Name: BEVERLEY ALLENDER MRN: [id number] DOB: 1961-05-09   Cancelled treatment:       Reason Eval/Treat Not Completed: Medical issues which prohibited therapy;Patient at procedure or test/unavailable (chart reviewed). Pt is currently NPO for Permacath procedure. ST services will f/u tomorrow.      Orinda Kenner, MS, CCC-SLP Speech Language Pathologist Rehab Services [phone] [facility] 09/04/2020, 3:07 PM

## 2020-09-04 NOTE — Interval H&P Note (Signed)
History and Physical Interval Note:  09/04/2020 4:57 PM  Stacy Pearson  has presented today for surgery, with the diagnosis of ARF.  The various methods of treatment have been discussed with the patient and family. After consideration of risks, benefits and other options for treatment, the patient has consented to  Procedure(s): DIALYSIS/PERMA CATHETER INSERTION (Right) as a surgical intervention.  The patient's history has been reviewed, patient examined, no change in status, stable for surgery.  I have reviewed the patient's chart and labs.  Questions were answered to the patient's satisfaction.     [name]

## 2020-09-04 NOTE — Progress Notes (Signed)
Occupational Therapy Treatment Patient Details Name: CLARE FENNIMORE MRN: [id number] DOB: 05/26/1961 Today's Date: 09/04/2020    History of present illness Pt admitted to 08/26/20 for OD (cariprazine and alprazolam) vs. UTI with sepsis. On presentation, pt was found to have neutrophilic predominant leukocytosis, lactic acidosis at 6.7, AKI, tachycardic, and tachypneic. CXR with low lung volumes and no infiltrate; HCT negative. RR called on 2/16. PMH significant for: bipolar, depression, anxiety, PD, epilepsy, HTN, and COPD.   OT comments  Pt seen for OT treatment on this date. Upon arrival to room, pt awake and participating in bathing with nursing. RN reporting pt with increased alertness this AM compared to yesterday; pt able to respond to ~30% of Y/N questions with one-word answer and able to laugh to some jokes. This session, pt required MAX A +2 to roll in bed for linen change; pt able to intiate RLE movement. Total assist +2 to scoot posteriorly towards HOB via chuck pad. Total assist for bed-level UB dressing and grooming d/t only trace movement with purposeful UE movement. Pt continues to benefit from skilled OT services to improve cognition/safety awareness and maximize independence with ADL/functional mobility. Will continue to follow POC. Discharge recommendation remains appropriate.    Follow Up Recommendations  CIR    Equipment Recommendations  Other (comment) (defer to next venue of care)       Precautions / Restrictions Precautions Precautions: Fall Precaution Comments: DNR Restrictions Weight Bearing Restrictions: No       Mobility Bed Mobility Overal bed mobility: Needs Assistance Bed Mobility: Rolling Rolling: Max assist;+2 for physical assistance         General bed mobility comments: MAX A +2 to roll in bed for linen change; pt able to intiate RLE movement. Total assist +2 to scoot posteriorly towards HOB via chuck pad.    Transfers                  General transfer comment: Further mobility deferred in setting of poor command follow    Balance       Sitting balance - Comments: OOB not appropriate at this time due to poor command follow                                   ADL either performed or assessed with clinical judgement   ADL Overall ADL's : Needs assistance/impaired     Grooming: Applying deodorant;Total assistance;Bed level   Upper Body Bathing: Total assistance;Bed level   Lower Body Bathing: Maximal assistance;Bed level   Upper Body Dressing : Total assistance;Bed level Upper Body Dressing Details (indicate cue type and reason): to don hospital gown                                   Cognition Arousal/Alertness: Awake/alert Behavior During Therapy: Flat affect Overall Cognitive Status: Impaired/Different from baseline Area of Impairment: Following commands;Awareness;Problem solving;Safety/judgement                       Following Commands: Follows one step commands inconsistently Safety/Judgement: Decreased awareness of safety;Decreased awareness of deficits   Problem Solving: Slow processing;Decreased initiation;Difficulty sequencing;Requires verbal cues;Requires tactile cues General Comments: RN reporting pt with increased alertness this AM; pt able to respond to ~30% of Y/N questions with one-word answer and able to laugh to some jokes. Following  one-step command, pt with minimal purposeful movement in UE/LE                   Pertinent Vitals/ Pain       Pain Assessment: No/denies pain         Frequency  Min 3X/week        Progress Toward Goals  OT Goals(current goals can now be found in the care plan section)     Acute Rehab OT Goals Patient Stated Goal: none stated OT Goal Formulation: With patient/family Time For Goal Achievement: 09/15/20 Potential to Achieve Goals: Good  Plan Discharge plan remains appropriate;Frequency remains appropriate        AM-PAC OT "6 Clicks" Daily Activity     Outcome Measure   Help from another person eating meals?: Total Help from another person taking care of personal grooming?: Total Help from another person toileting, which includes using toliet, bedpan, or urinal?: A Lot Help from another person bathing (including washing, rinsing, drying)?: A Lot Help from another person to put on and taking off regular upper body clothing?: Total Help from another person to put on and taking off regular lower body clothing?: A Lot 6 Click Score: 9    End of Session    OT Visit Diagnosis: Other abnormalities of gait and mobility (R26.89);Muscle weakness (generalized) (M62.81)   Activity Tolerance Patient limited by lethargy   Patient Left in bed;with call bell/phone within reach;with nursing/sitter in room   Nurse Communication Mobility status        Time: 0912-0930 OT Time Calculation (min): 18 min  Charges: OT General Charges $OT Visit: 1 Visit OT Treatments $Self Care/Home Management : 8-22 mins  Fredirick Maudlin, OTR/L [place]

## 2020-09-04 NOTE — Progress Notes (Signed)
PT Cancellation Note  Patient Details Name: ELPIDIA KARN MRN: [id number] DOB: 1960/07/15   Cancelled Treatment:    Reason Eval/Treat Not Completed: Other (comment). Treatment attempted, however pt very somnolent. Doesn't arouse to verbal/tactile stimuli. Heavy sternal rubbing with slight eye flutter. Attempted ROM performed with no active effort. Unable to participate, will re-attempt next available date. Attempted to call RN, no answer.   Ray,Stephanie 09/04/2020, 10:35 AM  Greggory Stallion, PT, DPT [phone]

## 2020-09-04 NOTE — Progress Notes (Signed)
PROGRESS NOTE    Stacy Pearson  IOE:[id number] DOB: 1960/09/15 DOA: 08/26/2020 PCP: Valera Castle, MD   Brief Narrative: Taken from H&P. Stacy Pearson is a 60 y.o. female with medical history significant of bipolar, depression, anxiety, Parkinson, epilepsy, HTN, COPD, presents with AMS. On presentation patient was confused and agitated.  Admitting provider was able to contact her mother who is the only contact listed, and according to her she lives alone and was at her normal state of health until Friday afternoon. She was sleeping most of the time since then and found to be more confused and somnolent and EMS was called. Mother was not aware of any recent illnesses. On presentation she was found to have neutrophilic predominant leukocytosis, lactic acidosis at 6.7, Covid PCR negative, AKI, tachycardic and tachypneic.  Chest x-ray with low lung volumes and no infiltrate.  CT head was negative.  Lactic acidosis started improving, worsening renal function and hyperkalemia next morning.  Procalcitonin elevated at 1.77.  Blood and urine cultures negative.  Remained very altered, unable to provide any history. Worsening renal function, nephrology started emergent HD on 2/16 after transfer to ICU/SD Neurology was consulted with persistent altered mental status, LP was done and CSF not c/w infectious etio, on empiric rocephin which was stopped on 2/18 by ID  2/16-> transfer to ICU/stepdown and started emergent hemodialysis placement of temporary dialysis catheter 2/17-> mental status started improving some with dialysis 2/18 significant improvement in mental status with ongoing dialysis.  Antibiotics stopped by ID 2/19 transferred on the floor  2/20 getting more sleepy -neuro signed off 2/21 -remains very lethargic so neurology reconsulted.   2/22 Permacath planned for 2/22 per vascular surgery for hemodialysis. Neuro reduced Sinemet dose on 2/21. Patient more awake today but now has  Temp 100.9 so I've requested ID for reeval. Requested to D/C foley. Palliative care c/s  Subjective: More awake today but does not follow command.  Moving extremities and eyeballs voluntarily  Assessment & Plan:   Principal Problem:   Delirium Active Problems:   Bipolar 1 disorder (HCC)   COPD (chronic obstructive pulmonary disease) (HCC)   Essential tremor   Migraine without aura   Parkinson's disease ([place])   Epilepsy ([place])   Depression with anxiety   HTN (hypertension)   Acute metabolic encephalopathy   UTI (urinary tract infection)   AKI (acute kidney injury) ([place])   Severe sepsis with septic shock (HCC)  Acute Metabolic encephalopathy   Present on admission due to polypharmacy and possible overdose Significant improvement in her mental status with dialysis but since transfer out of ICU 2 days ago she was more sleepy so neurology reconsulted on 2/21 and they have reduced /switched Sinemet IR 1 tablet p.o. 3 times daily.  Patient is more awake today Antibiotics discontinued per ID as urine, blood and CSF culture remain negative Patient had a T-max of 100.9 this a.m. with tachycardia concerning for development of infection.  I have asked ID to reevaluate her today -She had Foley placed while in ICU. I have requested discontinuing Foley today  AKI -dialysis per nephrology.  Could not get dialyzed yesterday due to nonfunctioning temporary cath.  Permacath planned for today per vascular surgery after which she may need dialysis  Acute hypoxic respiratory failure Now resolved, weaned off BiPAP and on nasal cannula oxygen.  Hyperkalemia.  Resolved  Most likely secondary to worsening renal function.  Concern of renal tubular acidosis.  Anion gap metabolic acidosis.  Most likely secondary  to lactic acidosis. Elevated CK so rhabdomyolysis can be playing a role. -Improving with HD  Bipolar 1 disorder with depression and anxiety.  For now hold Ativan as she is sleepy.  Psychiatry  signed off.  Dr. Weber Cooks did reevaluate her on 2/21  COPD.  Appears stable, no wheezing and chest x-ray without any acute abnormality. -As needed bronchodilators  History of Parkinson's disease.  Unknown baseline -Neurology changing Sinemet 1 tablet p.o. 3 times daily on 2/21.  Patient is more awake today -Continue with propranolol-do not know whether tremors are essential or secondary to underlying Parkinson's.  History of migraine. -Continue as needed Tylenol  History of seizure disorder  Seizure ruled out, EEG negative for any epileptiform waves Continue Lamictal -unsure if this could cause aseptic meningitis? (Concern raised by pharmacist and I have asked ID and neuro to evaluate)  Hypertension.  Blood pressure running high On home propranolol. -Continue hydralazine to 25 mg p.o. every 6 hours and continue IV as needed  Generalized weakness PT, OT recommends CIR.  TOC aware   Objective: Vitals:   09/04/20 0840 09/04/20 0958 09/04/20 1401 09/04/20 1513  BP: (!) 151/73 (!) 147/74 (!) 147/80 (!) 152/83  Pulse: (!) 117 (!) 102 98 (!) 103  Resp: (!) 34 (!) 24 (!) 21   Temp: (!) 100.9 F (38.3 C) 97.8 F (36.6 C) 98.6 F (37 C) 97.7 F (36.5 C)  TempSrc: Oral Oral Oral   SpO2: 92% 90% 95% 94%  Weight:      Height:        Intake/Output Summary (Last 24 hours) at 09/04/2020 1536 Last data filed at 09/04/2020 0358 Gross per 24 hour  Intake --  Output 75 ml  Net -75 ml   Filed Weights   08/27/20 0100 08/28/20 0245 08/29/20 0337  Weight: 64.1 kg 66.8 kg 67.1 kg    Examination:  General.  Chronically ill-appearing lady, in no acute distress, awake Pulmonary.  Lungs clear bilaterally, normal respiratory effort.  CV.  Regular rate and rhythm, no JVD, rub or murmur. Abdomen.  Soft, nontender, nondistended, BS positive. CNS.  Awake.  Not following commands extremities.  No edema, no cyanosis, pulses intact and symmetrical. Psychiatry.  Difficult to evaluate due to her  mental status  DVT prophylaxis: Lovenox Code Status: DNR-admitting provider confirmed with mother Family Communication: I've been trying to call her mother daily for last few days but no luck (tried again on 2/22).  Her phone goes straight to her voicemail Disposition Plan: unknown Status is: Inpatient  Overall very poor prognosis.  I have placed palliative care consult for goals of care discussion  Remains inpatient appropriate because:Inpatient level of care appropriate due to severity of illness   Dispo: The patient is from: Home              Anticipated d/c is to: CIR              Anticipated d/c date is: > 3 days              Patient currently is not medically stable to d/c.     Difficult to place patient Yes              Level of care: Med-Surg  Consultants:   Psychiatry  Nephrology  Neurology  ID  Procedures:  Antimicrobials:  None  Data Reviewed: I have personally reviewed following labs and imaging studies  CBC: Recent Labs  Lab 08/31/20 0911 09/01/20 0531 09/02/20 0438 09/03/20 0529  09/03/20 2054  WBC 7.8 7.5 6.8 8.4 7.7  NEUTROABS  --   --   --   --  6.0  HGB 10.1* 9.7* 11.3* 13.5 10.2*  HCT 31.0* 29.4* 33.4* 42.8 31.3*  MCV 92.5 91.6 90.3 94.9 91.5  PLT 241 237 267 308 831   Basic Metabolic Panel: Recent Labs  Lab 08/29/20 1018 08/30/20 1000 08/31/20 0911 09/01/20 0531 09/02/20 0438 09/03/20 0529 09/03/20 2054 09/04/20 0509  NA 143 143 141 139 137 140 140 142  K 4.2 4.1 4.1 3.9 3.9 5.1 5.0 4.9  CL 104 101 100 97* 98 100 100 101  CO2 20* 22 20* 22 24 19* 18* 22  GLUCOSE 94 80 72 77 114* 77 85 83  BUN 96* 77* 50* 36* 54* 63* 72* 79*  CREATININE 8.43* 7.49* 6.00* 4.49* 6.31* 7.97* 9.14* 10.02*  CALCIUM 8.7* 8.8* 8.9 8.9 9.0 9.0 8.9 9.1  MG  --   --   --  2.2  --   --   --   --   PHOS 7.3* 7.7* 7.0* 6.9*  --   --   --   --    GFR: Estimated Creatinine Clearance: 5.9 mL/min (A) (by C-G formula based on SCr of 10.02 mg/dL (H)). Liver  Function Tests: Recent Labs  Lab 08/29/20 1018 08/30/20 1000 08/31/20 0911 09/03/20 2054  AST  --  20  --  11*  ALT  --  <5  --  <5  ALKPHOS  --  75  --  72  BILITOT  --  0.6  --  1.2  PROT  --  6.5  --  7.0  ALBUMIN 2.9* 2.7*  2.7* 2.7* 2.7*   No results for input(s): LIPASE, AMYLASE in the last 168 hours. No results for input(s): AMMONIA in the last 168 hours. Coagulation Profile: Recent Labs  Lab 09/03/20 2054  INR 1.2   Cardiac Enzymes: Recent Labs  Lab 08/29/20 1018  CKTOTAL 183   BNP (last 3 results) No results for input(s): PROBNP in the last 8760 hours. HbA1C: No results for input(s): HGBA1C in the last 72 hours. CBG: Recent Labs  Lab 08/29/20 1152 08/30/20 1127  GLUCAP 83 95   Lipid Profile: No results for input(s): CHOL, HDL, LDLCALC, TRIG, CHOLHDL, LDLDIRECT in the last 72 hours. Thyroid Function Tests: No results for input(s): TSH, T4TOTAL, FREET4, T3FREE, THYROIDAB in the last 72 hours. Anemia Panel: No results for input(s): VITAMINB12, FOLATE, FERRITIN, TIBC, IRON, RETICCTPCT in the last 72 hours. Sepsis Labs: Recent Labs  Lab 09/03/20 2056 09/03/20 2257  LATICACIDVEN 1.0 0.8    Recent Results (from the past 240 hour(s))  Resp Panel by RT-PCR (Flu A&B, Covid) Urine, Clean Catch     Status: None   Collection Time: 08/26/20  5:08 AM   Specimen: Urine, Clean Catch; Nasopharyngeal(NP) swabs in vial transport medium  Result Value Ref Range Status   SARS Coronavirus 2 by RT PCR NEGATIVE NEGATIVE Final    Comment: (NOTE) SARS-CoV-2 target nucleic acids are NOT DETECTED.  The SARS-CoV-2 RNA is generally detectable in upper respiratory specimens during the acute phase of infection. The lowest concentration of SARS-CoV-2 viral copies this assay can detect is 138 copies/mL. A negative result does not preclude SARS-Cov-2 infection and should not be used as the sole basis for treatment or other patient management decisions. A negative result may  occur with  improper specimen collection/handling, submission of specimen other than nasopharyngeal swab, presence of viral mutation(s) within  the areas targeted by this assay, and inadequate number of viral copies(<138 copies/mL). A negative result must be combined with clinical observations, patient history, and epidemiological information. The expected result is Negative.  Fact Sheet for Patients:  EntrepreneurPulse.com.au  Fact Sheet for Healthcare Providers:  IncredibleEmployment.be  This test is no t yet approved or cleared by the [place] FDA and  has been authorized for detection and/or diagnosis of SARS-CoV-2 by FDA under an Emergency Use Authorization (EUA). This EUA will remain  in effect (meaning this test can be used) for the duration of the COVID-19 declaration under Section 564(b)(1) of the Act, 21 U.S.C.section 360bbb-3(b)(1), unless the authorization is terminated  or revoked sooner.       Influenza A by PCR NEGATIVE NEGATIVE Final   Influenza B by PCR NEGATIVE NEGATIVE Final    Comment: (NOTE) The Xpert Xpress SARS-CoV-2/FLU/RSV plus assay is intended as an aid in the diagnosis of influenza from Nasopharyngeal swab specimens and should not be used as a sole basis for treatment. Nasal washings and aspirates are unacceptable for Xpert Xpress SARS-CoV-2/FLU/RSV testing.  Fact Sheet for Patients: EntrepreneurPulse.com.au  Fact Sheet for Healthcare Providers: IncredibleEmployment.be  This test is not yet approved or cleared by the [place] FDA and has been authorized for detection and/or diagnosis of SARS-CoV-2 by FDA under an Emergency Use Authorization (EUA). This EUA will remain in effect (meaning this test can be used) for the duration of the COVID-19 declaration under Section 564(b)(1) of the Act, 21 U.S.C. section 360bbb-3(b)(1), unless the authorization is terminated  or revoked.  Performed at [facility], [address] [place], [place] [address]   Urine culture     Status: None   Collection Time: 08/26/20  5:54 AM   Specimen: Urine, Clean Catch  Result Value Ref Range Status   Specimen Description   Final    URINE, CLEAN CATCH Performed at [facility], [address] [place], [place] [address]    Special Requests   Final    NONE Performed at [facility], [address], [place], [place] [address]    Culture   Final    NO GROWTH Performed at [facility], [place] [address] [place], [place] [address]    Report Status 08/27/2020 FINAL  Final  Culture, blood (routine x 2)     Status: None   Collection Time: 08/26/20  6:03 AM   Specimen: Left Antecubital; Blood  Result Value Ref Range Status   Specimen Description LEFT ANTECUBITAL  Final   Special Requests   Final    BOTTLES DRAWN AEROBIC AND ANAEROBIC Blood Culture adequate volume   Culture   Final    NO GROWTH 5 DAYS Performed at [facility], [address], [place], [place] [address]    Report Status 08/31/2020 FINAL  Final  Culture, blood (routine x 2)     Status: None   Collection Time: 08/26/20  8:55 AM   Specimen: BLOOD LEFT FOREARM  Result Value Ref Range Status   Specimen Description BLOOD LEFT FOREARM  Final   Special Requests   Final    BOTTLES DRAWN AEROBIC AND ANAEROBIC Blood Culture adequate volume   Culture   Final    NO GROWTH 5 DAYS Performed at [facility], [address] [place], [place] [address]    Report Status 08/31/2020 FINAL  Final  CSF culture w Gram Stain     Status: None   Collection Time: 08/28/20  12:01 PM   Specimen: PATH Cytology CSF; Cerebrospinal Fluid  Result Value Ref Range Status   Specimen Description   Final    CSF Performed at [facility], [address], [place], [place] [address]    Special Requests   Final    NONE Performed at [facility], [place] [address] [place], [place] [address]    Gram Stain   Final    WBC SEEN RED BLOOD CELLS NO ORGANISMS SEEN Performed at [facility], [address], [place], [place] [address]    Culture   Final    NO GROWTH Performed at [facility], [place] [address], [place], [place] [address]    Report Status 08/31/2020 FINAL  Final  Culture, fungus without smear     Status: None (Preliminary result)   Collection Time: 08/28/20 12:01 PM   Specimen: PATH Cytology CSF; Cerebrospinal Fluid  Result Value Ref Range Status   Specimen Description   Final    CSF Performed at [facility], [address], [place], [place] [address]    Special Requests NONE  Final   Culture   Final    NO FUNGUS ISOLATED AFTER 8 DAYS Performed at [facility], [place] [address] [place], [place] [address]    Report Status PENDING  Incomplete  Culture, blood (x 2)     Status: None (Preliminary result)   Collection Time: 09/03/20  8:54 PM   Specimen: BLOOD  Result Value Ref Range Status   Specimen Description BLOOD BLOOD RIGHT FOREARM  Final   Special Requests   Final    BOTTLES DRAWN AEROBIC AND ANAEROBIC Blood Culture adequate volume   Culture   Final    NO GROWTH < 12 HOURS Performed at [facility], [address], [place], [place] [address]    Report Status PENDING  Incomplete  Culture, blood (x 2)     Status: None (Preliminary result)   Collection Time: 09/03/20  8:56 PM   Specimen: BLOOD  Result Value Ref Range Status   Specimen Description BLOOD RIGHT ANTECUBITAL  Final   Special Requests   Final    BOTTLES DRAWN AEROBIC AND ANAEROBIC Blood Culture adequate volume   Culture   Final    NO GROWTH < 12 HOURS Performed at [facility], [address], [place], [place] [address]    Report Status PENDING  Incomplete     Radiology Studies: DG Chest Port 1 View  Result Date: 09/03/2020 CLINICAL DATA:  Sepsis EXAM: PORTABLE CHEST 1 VIEW COMPARISON:  08/29/2020 FINDINGS:  Surgical hardware in the cervical spine. No focal consolidation or effusion. Stable cardiomediastinal silhouette. No pneumothorax. IMPRESSION: No active disease. Electronically Signed   By: Donavan Foil M.D.   On: 09/03/2020 21:28    Scheduled Meds: . carbidopa-levodopa  1 tablet Oral TID  . Chlorhexidine Gluconate Cloth  6 each Topical Q0600  . vitamin B-12  1,000 mcg Oral Daily   Or  . cyanocobalamin  1,000 mcg Intramuscular Daily  . heparin injection (subcutaneous)  5,000 Units Subcutaneous Q8H  . hydrALAZINE  25 mg Oral Q6H  . Ipratropium-Albuterol  1 puff Inhalation Q6H  . lamoTRIgine  25 mg Oral BID  . propranolol ER  60 mg Oral Daily   Continuous Infusions: . sodium chloride Stopped (09/03/20 1244)     LOS: 9 days   Time spent : 20 minutes.  Max Sane, MD [facility]  If 7PM-7AM, please  contact night-coverage [email or web address]  09/04/2020, 3:36 PM   This record has been created using Dragon voice recognition software. Errors have been sought and corrected,but may not always be located. Such creation errors do not reflect on the standard of care.

## 2020-09-04 NOTE — Op Note (Signed)
[facility]   OPERATIVE NOTE     PROCEDURE: 1. Insertion of a left IJ tunneled dialysis catheter. 2. Catheter placement and cannulation under ultrasound and fluoroscopic guidance  PRE-OPERATIVE DIAGNOSIS: end-stage renal requiring hemodialysis  POST-OPERATIVE DIAGNOSIS: same as above  SURGEON: [name]  ANESTHESIA: Conscious sedation was administered under my direct supervision by the interventional radiology RN. IV Versed plus fentanyl were utilized. Continuous ECG, pulse oximetry and blood pressure was monitored throughout the entire procedure.  Conscious sedation was for a total of 31 minutes and 24 seconds.  ESTIMATED BLOOD LOSS: Minimal  FINDING(S): 1.  Tips of the catheter in the right atrium on fluoroscopy 2.  No obvious pneumothorax on fluoroscopy  SPECIMEN(S):  none  INDICATIONS:   Stacy Pearson is a 60 y.o. female  presents with end stage renal disease.  Therefore, the patient requires a tunneled dialysis catheter placement.  The patient is informed of  the risks catheter placement include but are not limited to: bleeding, infection, central venous injury, pneumothorax, possible venous stenosis, possible malpositioning in the venous system, and possible infections related to long-term catheter presence.  The patient was aware of these risks and agreed to proceed.  DESCRIPTION: The patient was taken back to Special Procedure suite.  Prior to sedation, the patient was given IV antibiotics.  After obtaining adequate sedation, the patient was prepped and draped in the standard fashion for a left IJ tunneled dialysis catheter placement.  Appropriate Time Out is called.     The left neck and chest wall are then infiltrated with 1% Lidocaine with epinepherine.  A 23 cm tip to cuff catheter is then selected, opened on the back table and prepped. Ultrasound is placed in a sterile sleeve.  Under ultrasound guidance, the left internal jugular vein is  examined and is noted to be echolucent and easily compressible indicating patency.   An image is recorded for the permanent record.  The left internal jugular vein is cannulated with the microneedle under direct ultrasound vissualization.  A Microwire followed by a micro sheath is then inserted without difficulty.   A J-wire was then advanced under fluoroscopic guidance into the inferior vena cava and the wire was secured.  Small counter incision was then made at the wire insertion site. A small pocket was fashioned with blunt dissection to allow easier passage of the cuff.  The dilator and peel-away sheath are then advanced over the wire under fluoroscopic guidance. The catheters and advanced through the peel-away sheath. It is approximated to the left chest wall after verifying the tips at the atrial caval junction and an exit site is selected.  Small incision is made at the selected exit site and the tunneling device was passed subcutaneously to the counter incision. Catheter is then pulled through the subcutaneous tunnel. The catheter is then verified for tip position under fluoroscopy, transected and the hub assembly connected.    Each port was tested by aspirating and flushing.  No resistance was noted.  Each port was then thoroughly flushed with heparinized saline.  The catheter was secured in placed with two interrupted stitches of 0 silk tied to the catheter.  The counter incision was closed with a U-stitch of 4-0 Monocryl.  The insertion site is then cleaned and sterile bandages applied including a Biopatch.  Each port was then packed with concentrated heparin (1000 Units/mL) at the manufacturer recommended volumes to each port.  Sterile caps were applied to each port.  On completion  fluoroscopy, the tips of the catheter were in the right atrium, and there was no evidence of pneumothorax.  COMPLICATIONS: None  CONDITION: Unchanged   [name] [facility] Office:  [phone]   09/04/2020, 5:53 PM

## 2020-09-04 NOTE — Progress Notes (Signed)
Pharmacy Antibiotic Note  Stacy Pearson is a 60 y.o. female admitted on 08/26/2020 with encephalopathy. Meningitis ruled out.  She  Pharmacy has been consulted for vancomycin and cefepime dosing. Her renal function has worsened during admission and now will require HD. Plan for permacath placement today and start HD  Today, 09/04/2020   Tmax last evening 101.3  WBC WBL  SCr now 10 mg/dl with little UOP  Blood cultures unrevealing to date  Plan to remove foley and femoral line  Plan:  Vancomycin 1500mg  IV x 1 and f/u timing of HD  MOnitor renal function  Check levels as needed  Cefepime 2gm IV x1 then 1gm IV q24h  Height: 5\' 7"  (170.2 cm) Weight: 67.1 kg (147 lb 14.9 oz) IBW/kg (Calculated) : 61.6  Temp (24hrs), Avg:99.4 F (37.4 C), Min:97.7 F (36.5 C), Max:101.3 F (38.5 C)  Recent Labs  Lab 08/31/20 0911 09/01/20 0531 09/02/20 0438 09/03/20 0529 09/03/20 2054 09/03/20 2056 09/03/20 2257 09/04/20 0509  WBC 7.8 7.5 6.8 8.4 7.7  --   --   --   CREATININE 6.00* 4.49* 6.31* 7.97* 9.14*  --   --  10.02*  LATICACIDVEN  --   --   --   --   --  1.0 0.8  --     Estimated Creatinine Clearance: 5.9 mL/min (A) (by C-G formula based on SCr of 10.02 mg/dL (H)).    Allergies  Allergen Reactions  . Codeine Nausea And Vomiting  . Prednisone     Antimicrobials this admission: Vancomycin 2/13 >> 2/15, 2/22 >> Cefepime 2/13 >> 2/15, 2/22 >> Acyclovir 2/13 >> 2/15 Ceftriaxone 2/15 >> 2/19  Microbiology results: 2/21 Bcx: NGTD 2/15 CSF: NG 2/13 BCx: NG 2/13 UCx: NG 2/13 SARS CoV-2: negative 2/13 influenza A/B: negaive   Thank you for allowing pharmacy to be a part of this patient's care.  Doreene Eland, PharmD, BCPS.   Work Cell: [phone] 09/04/2020 4:30 PM

## 2020-09-05 ENCOUNTER — Inpatient Hospital Stay: Payer: Medicare HMO

## 2020-09-05 DIAGNOSIS — R4182 Altered mental status, unspecified: Secondary | ICD-10-CM | POA: Diagnosis not present

## 2020-09-05 DIAGNOSIS — R410 Disorientation, unspecified: Secondary | ICD-10-CM | POA: Diagnosis not present

## 2020-09-05 LAB — BASIC METABOLIC PANEL
Anion gap: 20 — ABNORMAL HIGH (ref 5–15)
BUN: 99 mg/dL — ABNORMAL HIGH (ref 6–20)
CO2: 20 mmol/L — ABNORMAL LOW (ref 22–32)
Calcium: 8.6 mg/dL — ABNORMAL LOW (ref 8.9–10.3)
Chloride: 104 mmol/L (ref 98–111)
Creatinine, Ser: 11.79 mg/dL — ABNORMAL HIGH (ref 0.44–1.00)
GFR, Estimated: 3 mL/min — ABNORMAL LOW (ref >60)
Glucose, Bld: 94 mg/dL (ref 70–99)
Potassium: 5.3 mmol/L — ABNORMAL HIGH (ref 3.5–5.1)
Sodium: 144 mmol/L (ref 135–145)

## 2020-09-05 LAB — CBC
HCT: 29.5 % — ABNORMAL LOW (ref 36.0–46.0)
Hemoglobin: 9.9 g/dL — ABNORMAL LOW (ref 12.0–15.0)
MCH: 30.8 pg (ref 26.0–34.0)
MCHC: 33.6 g/dL (ref 30.0–36.0)
MCV: 91.9 fL (ref 80.0–100.0)
Platelets: 313 10*3/uL (ref 150–400)
RBC: 3.21 MIL/uL — ABNORMAL LOW (ref 3.87–5.11)
RDW: 14.2 % (ref 11.5–15.5)
WBC: 8.7 10*3/uL (ref 4.0–10.5)
nRBC: 0 % (ref 0.0–0.2)

## 2020-09-05 MED ORDER — RENA-VITE PO TABS
1.0000 | ORAL_TABLET | Freq: Every day | ORAL | Status: DC
  Administered 2020-09-05 – 2020-09-09 (×5): 1 via ORAL
  Filled 2020-09-05 (×5): qty 1

## 2020-09-05 MED ORDER — NEPRO/CARBSTEADY PO LIQD
237.0000 mL | Freq: Two times a day (BID) | ORAL | Status: DC
  Administered 2020-09-06 – 2020-09-09 (×5): 237 mL via ORAL

## 2020-09-05 MED ORDER — VANCOMYCIN HCL 1500 MG/300ML IV SOLN
1500.0000 mg | Freq: Once | INTRAVENOUS | Status: DC
  Filled 2020-09-05: qty 300

## 2020-09-05 NOTE — Progress Notes (Signed)
PT Cancellation Note  Patient Details Name: Stacy Pearson MRN: [id number] DOB: September 11, 1960   Cancelled Treatment:    Reason Eval/Treat Not Completed: Other (comment). RN deferring as pt is about to leave for EEG and then has HD. PT asked RN to notify PT when pt is back from EEG and she agreed. Will follow up with therapy intervention with time permitting.  Herminio Commons, PT, DPT 1:56 PM,09/05/20

## 2020-09-05 NOTE — Care Plan (Signed)
PMT note:  Consult noted. Patient is not in room at this time, and no family present. Will re-attempt tomorrow.

## 2020-09-05 NOTE — Progress Notes (Signed)
Subjective: Brief HPI: Stacy Omara [name] a 60 y.o.femalewith Parkinson's disease and epilepsy admitted with alternating somnolence, confusion, agitation and lethargy. Her mother states that she called EMS due to finding her daughter verbally unresponsive and in the exact same position in bed a day after last seeing her, which made her worried that her daughter might have been having a stroke. The patient was incontinent of urine at that time, but no evidence for any movement since last seen by mother, so seizure is unlikely. The description of the patient's recent depressed mood and social withdrawal including the lack of movement and an awake unresponsive state as provided by her mother is suggestive of acute catatonia. Mother states that she has only fragmented knowledge of her daughter's medical conditions and prescriptions, as her daughter does not like to share this information.   On admission she was found to have sepsis with unclear/unknown source, elevated serum lactate along with significant AKI. Started on meningitis coverage. CSF on 2/15 obtained after about 2 doses of Vanc, Cefepime and Acyclovir showed pleocytosis with WBC count of 39 and mildly elevated CSF protein of 64. CT head negative and MRI brainwas also negative for any significant abnormalities. EEG x 2 without clear seizure activity; findings were notable for triphasic waves along the ictal-interictal spectrum, but there was reactivity which was less concerning for seizures.She underwent dialysis x 3 with significant improvement in her mentation. CSF culture came back negative on 2/17 and ceftriaxone was stopped. RPR and VDRL also negative. She appeared catatonic on 2/21 exam. Of note, she had been prescribed Valium 10 mg PRN at home. New fever was noted on 2/22 and she was started on vancomycin and cefepime. On Thiamine high dose replacement until levels return. Also on Vit B12 replacement for a low level of 221. Management od AKI  per Nephrology. ID is following.   Objective: Current vital signs: BP (!) 172/92   Pulse (!) 102   Temp 99.5 F (37.5 C) (Oral)   Resp 18   Ht 5\' 7"  (1.702 m)   Wt 67.1 kg   SpO2 97%   BMI 23.17 kg/m  Vital signs in last 24 hours: Temp:  [97.7 F (36.5 C)-99.5 F (37.5 C)] 99.5 F (37.5 C) (02/23 0737) Pulse Rate:  [98-106] 102 (02/23 0737) Resp:  [17-24] 18 (02/23 0534) BP: (140-174)/(74-94) 172/92 (02/23 0737) SpO2:  [90 %-100 %] 97 % (02/23 0737) Weight:  [67.1 kg] 67.1 kg (02/22 1621)  Intake/Output from previous day: No intake/output data recorded. Intake/Output this shift: No intake/output data recorded. Nutritional status:  Diet Order            Diet renal with fluid restriction Fluid restriction: 1200 mL Fluid; Room service appropriate? Yes; Fluid consistency: Thin  Diet effective now                HEENT: [place]/AT Lungs: Respirations unlabored Ext: No edema  Neurologic Exam: Mental Status: Awake with eyes open, staring straight ahead. Minimal verbal output consists of single words only as follows: "yeah" when asked if she wants to go home; "oww" to several noxious stimuli and "Adonis Huguenin" when asked her mother's name. Otherwise, she will not answer any questions or follow any commands.  Cranial Nerves: II:  No blink to threat. PERRL. Did purse her lips as though trying to make an "f" sound when five fingers held up and asked to count them.  III,IV, VI: No ptosis. Eyes conjugate, staring straight ahead. Does not gaze towards or away  from any visual stimuli.  VII: Purses lips to noxious without asymmetry. Intermittent episodes of continuous rapid twitching of perioral muscles on the left.  VIII: Hearing intact to some questions IX,X: Unable to assess palate/uvula XI: Unable to assess XII: Did not protrude tongue to command Motor/Sensory: Triceps tone is mildly increased to LUE, otherwise LUE with flaccid tone.  RUE with flaccid tone.  No movement of BUE to  command or any noxious stimuli. However, she will exclaim "oww" to pinch of each upper extremity. Arms drop to bed without resistance when passively elevated and released.  BLE with spontaneous movement such as crossing of legs and flexion at hips/knees when repositioning. However, she will not move lower extremities to command. There is vigorous volitional withdrawal of RLE to noxious plantar stimulation.  LLE with dorsiflexion of ankle and minimal flexion of knee to noxious plantar stimulation.  Deep Tendon Reflexes:  Right biceps and brachioradialis: 2+ Left biceps and brachioradialis: 3+ Right patella: 2+. Right achilles 3+ Left patella 3+. Left achilles 3+ Plantars: Right: downgoing  Left: equivocal Cerebellar/Gait: Unable to assess   Lab Results: Results for orders placed or performed during the hospital encounter of 08/26/20 (from the past 48 hour(s))  Culture, blood (x 2)     Status: None (Preliminary result)   Collection Time: 09/03/20  8:54 PM   Specimen: BLOOD  Result Value Ref Range   Specimen Description BLOOD BLOOD RIGHT FOREARM    Special Requests      BOTTLES DRAWN AEROBIC AND ANAEROBIC Blood Culture adequate volume   Culture      NO GROWTH 2 DAYS Performed at [facility], [address] [place], [place] [address]    Report Status PENDING   CBC with Differential     Status: Abnormal   Collection Time: 09/03/20  8:54 PM  Result Value Ref Range   WBC 7.7 4.0 - 10.5 K/uL   RBC 3.42 (L) 3.87 - 5.11 MIL/uL   Hemoglobin 10.2 (L) 12.0 - 15.0 g/dL   HCT 31.3 (L) 36.0 - 46.0 %   MCV 91.5 80.0 - 100.0 fL   MCH 29.8 26.0 - 34.0 pg   MCHC 32.6 30.0 - 36.0 g/dL   RDW 14.0 11.5 - 15.5 %   Platelets 340 150 - 400 K/uL   nRBC 0.0 0.0 - 0.2 %   Neutrophils Relative % 78 %   Neutro Abs 6.0 1.7 - 7.7 K/uL   Lymphocytes Relative 10 %   Lymphs Abs 0.8 0.7 - 4.0 K/uL   Monocytes Relative 9 %   Monocytes Absolute 0.7 0.1 - 1.0 K/uL   Eosinophils Relative 1 %    Eosinophils Absolute 0.1 0.0 - 0.5 K/uL   Basophils Relative 1 %   Basophils Absolute 0.1 0.0 - 0.1 K/uL   Immature Granulocytes 1 %   Abs Immature Granulocytes 0.06 0.00 - 0.07 K/uL    Comment: Performed at [facility], [address] [place], [place] [address]  Comprehensive metabolic panel     Status: Abnormal   Collection Time: 09/03/20  8:54 PM  Result Value Ref Range   Sodium 140 135 - 145 mmol/L    Comment: LYTES REPEATED   Potassium 5.0 3.5 - 5.1 mmol/L   Chloride 100 98 - 111 mmol/L   CO2 18 (L) 22 - 32 mmol/L   Glucose, Bld 85 70 - 99 mg/dL    Comment: Glucose reference range applies only to samples taken after fasting for at least 8  hours.   BUN 72 (H) 6 - 20 mg/dL   Creatinine, Ser 9.14 (H) 0.44 - 1.00 mg/dL   Calcium 8.9 8.9 - 10.3 mg/dL   Total Protein 7.0 6.5 - 8.1 g/dL   Albumin 2.7 (L) 3.5 - 5.0 g/dL   AST 11 (L) 15 - 41 U/L   ALT <5 0 - 44 U/L   Alkaline Phosphatase 72 38 - 126 U/L   Total Bilirubin 1.2 0.3 - 1.2 mg/dL   GFR, Estimated 5 (L) >60 mL/min    Comment: (NOTE) Calculated using the CKD-EPI Creatinine Equation (2021)    Anion gap 22 (H) 5 - 15    Comment: Performed at [facility] [address] [place], [place] [address]  Protime-INR     Status: None   Collection Time: 09/03/20  8:54 PM  Result Value Ref Range   Prothrombin Time 14.9 11.4 - 15.2 seconds   INR 1.2 0.8 - 1.2    Comment: (NOTE) INR goal varies based on device and disease states. Performed at [facility], [address] [place], [place] [address]   APTT     Status: Abnormal   Collection Time: 09/03/20  8:54 PM  Result Value Ref Range   aPTT 47 (H) 24 - 36 seconds    Comment:        IF BASELINE aPTT IS ELEVATED, SUGGEST PATIENT RISK ASSESSMENT BE USED TO DETERMINE APPROPRIATE ANTICOAGULANT THERAPY. Performed at [facility], [address] [place], [place] [address]   Culture, blood (x 2)     Status: None (Preliminary result)    Collection Time: 09/03/20  8:56 PM   Specimen: BLOOD  Result Value Ref Range   Specimen Description BLOOD RIGHT ANTECUBITAL    Special Requests      BOTTLES DRAWN AEROBIC AND ANAEROBIC Blood Culture adequate volume   Culture      NO GROWTH 2 DAYS Performed at [facility], [address] [place], [place] [address]    Report Status PENDING   Lactic acid, plasma     Status: None   Collection Time: 09/03/20  8:56 PM  Result Value Ref Range   Lactic Acid, Venous 1.0 0.5 - 1.9 mmol/L    Comment: Performed at [facility], [address] [place], [place] [address]  Lactic acid, plasma     Status: None   Collection Time: 09/03/20 10:57 PM  Result Value Ref Range   Lactic Acid, Venous 0.8 0.5 - 1.9 mmol/L    Comment: Performed at [facility], [address] [place], [place] [address]  Urinalysis, Routine w reflex microscopic     Status: Abnormal   Collection Time: 09/03/20 11:00 PM  Result Value Ref Range   Color, Urine YELLOW (A) YELLOW   APPearance HAZY (A) CLEAR   Specific Gravity, Urine 1.009 1.005 - 1.030   pH 5.0 5.0 - 8.0   Glucose, UA NEGATIVE NEGATIVE mg/dL   Hgb urine dipstick SMALL (A) NEGATIVE   Bilirubin Urine NEGATIVE NEGATIVE   Ketones, ur 5 (A) NEGATIVE mg/dL   Protein, ur >=300 (A) NEGATIVE mg/dL   Nitrite NEGATIVE NEGATIVE   Leukocytes,Ua SMALL (A) NEGATIVE   RBC / HPF 11-20 0 - 5 RBC/hpf   WBC, UA 21-50 0 - 5 WBC/hpf   Bacteria, UA RARE (A) NONE SEEN   Squamous Epithelial / LPF 0-5 0 - 5   WBC Clumps PRESENT    Mucus PRESENT    Budding Yeast PRESENT  Amorphous Crystal PRESENT     Comment: Performed at [facility], [address] [place], [place] [address]  Basic metabolic panel     Status: Abnormal   Collection Time: 09/04/20  5:09 AM  Result Value Ref Range   Sodium 142 135 - 145 mmol/L   Potassium 4.9 3.5 - 5.1 mmol/L   Chloride 101 98 - 111 mmol/L   CO2 22 22 - 32 mmol/L   Glucose, Bld 83 70 - 99 mg/dL     Comment: Glucose reference range applies only to samples taken after fasting for at least 8 hours.   BUN 79 (H) 6 - 20 mg/dL   Creatinine, Ser 10.02 (H) 0.44 - 1.00 mg/dL   Calcium 9.1 8.9 - 10.3 mg/dL   GFR, Estimated 4 (L) >60 mL/min    Comment: (NOTE) Calculated using the CKD-EPI Creatinine Equation (2021)    Anion gap 19 (H) 5 - 15    Comment: Performed at [facility], [address] [place], [place] [address]  CK     Status: None   Collection Time: 09/04/20  5:09 AM  Result Value Ref Range   Total CK 49 38 - 234 U/L    Comment: Performed at [facility], [address] [place], [place] [address]  CBC     Status: Abnormal   Collection Time: 09/05/20  4:24 AM  Result Value Ref Range   WBC 8.7 4.0 - 10.5 K/uL   RBC 3.21 (L) 3.87 - 5.11 MIL/uL   Hemoglobin 9.9 (L) 12.0 - 15.0 g/dL   HCT 29.5 (L) 36.0 - 46.0 %   MCV 91.9 80.0 - 100.0 fL   MCH 30.8 26.0 - 34.0 pg   MCHC 33.6 30.0 - 36.0 g/dL   RDW 14.2 11.5 - 15.5 %   Platelets 313 150 - 400 K/uL   nRBC 0.0 0.0 - 0.2 %    Comment: Performed at [facility], [address] [place], [place] [address]  Basic metabolic panel     Status: Abnormal   Collection Time: 09/05/20  4:24 AM  Result Value Ref Range   Sodium 144 135 - 145 mmol/L   Potassium 5.3 (H) 3.5 - 5.1 mmol/L   Chloride 104 98 - 111 mmol/L   CO2 20 (L) 22 - 32 mmol/L   Glucose, Bld 94 70 - 99 mg/dL    Comment: Glucose reference range applies only to samples taken after fasting for at least 8 hours.   BUN 99 (H) 6 - 20 mg/dL   Creatinine, Ser 11.79 (H) 0.44 - 1.00 mg/dL   Calcium 8.6 (L) 8.9 - 10.3 mg/dL   GFR, Estimated 3 (L) >60 mL/min    Comment: (NOTE) Calculated using the CKD-EPI Creatinine Equation (2021)    Anion gap 20 (H) 5 - 15    Comment: Performed at [facility], [address] [place], [place] [address]    Recent Results (from the past 240 hour(s))  CSF culture w Gram Stain     Status: None    Collection Time: 08/28/20 12:01 PM   Specimen: PATH Cytology CSF; Cerebrospinal Fluid  Result Value Ref Range Status   Specimen Description   Final    CSF Performed at [facility], [address], [place], [place] [address]    Special Requests   Final    NONE Performed at [facility], [place] [address] [place], [place] [address]    Gram Stain  Final    WBC SEEN RED BLOOD CELLS NO ORGANISMS SEEN Performed at [facility], [address], [place], [place] [address]    Culture   Final    NO GROWTH Performed at [facility], [place] [address] [place], [place] [address]    Report Status 08/31/2020 FINAL  Final  Culture, fungus without smear     Status: None (Preliminary result)   Collection Time: 08/28/20 12:01 PM   Specimen: PATH Cytology CSF; Cerebrospinal Fluid  Result Value Ref Range Status   Specimen Description   Final    CSF Performed at [facility], [address], [place], [place] [address]    Special Requests NONE  Final   Culture   Final    NO FUNGUS ISOLATED AFTER 8 DAYS Performed at [facility], [place] [address], [place], [place] [address]    Report Status PENDING  Incomplete  Culture, blood (x 2)     Status: None (Preliminary result)   Collection Time: 09/03/20  8:54 PM   Specimen: BLOOD  Result Value Ref Range Status   Specimen Description BLOOD BLOOD RIGHT FOREARM  Final   Special Requests   Final    BOTTLES DRAWN AEROBIC AND ANAEROBIC Blood Culture adequate volume   Culture   Final    NO GROWTH 2 DAYS Performed at [facility], [address], [place], [place] [address]    Report Status PENDING  Incomplete  Culture, blood (x 2)     Status: None (Preliminary result)   Collection Time: 09/03/20  8:56 PM   Specimen: BLOOD  Result Value Ref Range Status   Specimen Description BLOOD RIGHT ANTECUBITAL  Final   Special Requests   Final    BOTTLES DRAWN AEROBIC AND ANAEROBIC Blood Culture adequate  volume   Culture   Final    NO GROWTH 2 DAYS Performed at [facility], [address] [place], [place] [address]    Report Status PENDING  Incomplete    Lipid Panel No results for input(s): CHOL, TRIG, HDL, CHOLHDL, VLDL, LDLCALC in the last 72 hours.  Studies/Results: PERIPHERAL VASCULAR CATHETERIZATION  Result Date: 09/04/2020 See Op Note  DG Chest Port 1 View  Result Date: 09/03/2020 CLINICAL DATA:  Sepsis EXAM: PORTABLE CHEST 1 VIEW COMPARISON:  08/29/2020 FINDINGS: Surgical hardware in the cervical spine. No focal consolidation or effusion. Stable cardiomediastinal silhouette. No pneumothorax. IMPRESSION: No active disease. Electronically Signed   By: Donavan Foil M.D.   On: 09/03/2020 21:28    Medications:  Scheduled: . carbidopa-levodopa  1 tablet Oral TID  . Chlorhexidine Gluconate Cloth  6 each Topical Q0600  . vitamin B-12  1,000 mcg Oral Daily   Or  . cyanocobalamin  1,000 mcg Intramuscular Daily  . heparin injection (subcutaneous)  5,000 Units Subcutaneous Q8H  . hydrALAZINE  25 mg Oral Q6H  . Ipratropium-Albuterol  1 puff Inhalation Q6H  . lamoTRIgine  25 mg Oral BID  . propranolol ER  60 mg Oral Daily   Continuous: . sodium chloride Stopped (09/03/20 1244)  . ceFEPime (MAXIPIME) IV    . vancomycin      CTH without contrast: CTH was negative for acute abnormality.  MRI Brain without contrast is normal.   LP with WBC count of 39, RBC count of 10. Protein of 64, glucose of 75. CSF Gram stain with no organisms.  CSF cultures negative.  EEG: This study is suggestive of moderate diffuse encephalopathy, nonspecific etiology.  Given the morphology and frequency of periodic dischargeswith triphasic morphology are most likely due to toxic-metabolic causes.No seizures ordefiniteepileptiform discharges were seen throughout the recording.  Repeat rEEG: This study is suggestive of moderate diffuse encephalopathy, nonspecific etiology. No  seizures orepileptiform discharges were seen throughout the recording.   Assessment: 60 year old female with waxing and waning level of consciousness varying from lethargy to somnolence to confusion with agitation, now appearing most consistent with catatonia - DDx for her presentation includes catatonia induced by benzodiazepine withdrawal, medication overdose and metabolic encephalopathy due to uremia. Possible component of dialysis dysequilibrium syndrome as well. .   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 Most cases are spontaneous, but rare cases can result from withdrawal from benzodiazepines. She did have a prescription for Valium 10 mg PRN at home. - Her examinations had been improving with dialysis, but mental status continues to fluctuate. Today he exam is slightly improved, with patient able to utter 3 words in response to questions/external stimuli. 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 A third finding, not seen on prior exams, is intermittent episodes of continuous rapid twitching of perioral muscles on the left. For the latter, a repeat EEG will be needed.  - MRI on 2/15, which was severely degraded by motion artifact, revealed no acute intracranial abnormality.  - EEG x 2 showed findings suggestive of a moderate diffuse encephalopathy, most likely due to toxic-metabolic causes. No seizures ordefinite epileptiform discharges were seen. - CSF culture and VDRL were negative.  - CK was 364 on 2/14, then trended downwards. AKI unlikely to be due to rhabdomyolysis - Until Sunday she was receiving an unusually high total daily dose of Sinemet as follows: Controlled Release formulation 25/100 one tablet TID and two tablets QHS in addition to Immediate Release  formulation 25/100 2 tablets BID and one tablet in the evening. Chart review also reveals that her unusually high Sinemet dosing regimen was per her [facility] Neurologist who was titrating this medication for control of her worsening tremors and stiffness. Would still continue on the lower dosage and assess for improvement. Would closely monitor her temperature as withdrawal from Sinemet can result in neuroleptic malignant syndrome. Of note, excess levodopa would generally be expected to result in side effect of dyskinesia, psychomotor agitation, hallucinations and tachycardia, but Pharmacy notes a prior case in which a patient on too high of a Sinemet dose experienced sensation of slowing down as if in "mud". Therefore a trial of a lower dose of Sinemet is indicated.  - Per Pharmacy, Lamictal can in rare cases result in aseptic meningitis. Her clinical picture is not consistent with this but will consider holding Lamictal or switching to an alternate anticonvulsant if no improvement on lower dose of Sinemet. Per Pharmacy, Lamictal is prescribed by a Psychiatrist and indication for this medication is therefore most likely mood stabilization, rather than seizure control. Seizures are listed in her autopopulated PMHx, but the Neurology notes from [place] do not mention seizures (they see her for her Parkinsonism) and her mother denies any history of seizures.  - Regarding metabolic encephalopathy as a likely contributing factor, she has been diagnosed with AKI and has undergone acute dialysis while inpatient, completing 3 treatments. Baseline creatinine 0.8 with normal GFR on 01/31/2020. Her right femoral temp HD  catheter is no longer functioning and Nephrology has ordered a Permacath. BUN is 99 and Cr 11.79 today.  - Ammonia is normal at 28. Phosphorus elevated at 6.9.   Recommendations: 1. Continue her lowered total daily levodopa dose with Sinemet IR to 3 pills per day. She has improved somewhat, but unclear if due  to lowering of the dose. Of note, lowering the dose is safe, but would not abruptly withdraw Sinemet as that can result in neuroleptic malignant syndrome.  2. Continue to watch for improvement with dialysis sessions.  3. Other DDx includes dialysis disequilibrium syndrome. Would discuss with Nephrology regarding possible modifications to her dialysis protocol.  4. Blood CX ordered on Monday shows no growth x 2 days  5. Repeat EEG has been ordered 6. Will need repeat MRI brain with Ativan sedation as initial study was of low resolution due to movement artifact. Also will need MRI cervical spine to assess left sided motor findings suggestive of UMN lesion. MRI scans have been ordered.     LOS: 10 days   @Electronically  signed: Dr. Kerney Elbe 09/05/2020  8:57 AM

## 2020-09-05 NOTE — Progress Notes (Signed)
PT Cancellation Note  Patient Details Name: Stacy Pearson MRN: [id number] DOB: Mar 29, 1961   Cancelled Treatment:    Reason Eval/Treat Not Completed: Patient at procedure or test/unavailable. Per RN, pt about to go to HD. Agreeable for PT to attempt this afternoon. Will follow up with therapy intervention with time permitting.  Herminio Commons, PT, DPT 9:26 AM,09/05/20

## 2020-09-05 NOTE — Progress Notes (Signed)
PT Cancellation Note  Patient Details Name: Stacy Pearson MRN: [id number] DOB: 06/21/1961   Cancelled Treatment:    Reason Eval/Treat Not Completed: Other (Per chart review, EEG completed at 1511, however, pt still not in room. Will follow up with therapy intervention tomorrow as appropriate.  Herminio Commons, PT, DPT 3:41 PM,09/05/20

## 2020-09-05 NOTE — Progress Notes (Signed)
eeg done °

## 2020-09-05 NOTE — Progress Notes (Addendum)
Speech Language Pathology Treatment: Dysphagia  Patient Details Name: Stacy Pearson MRN: [id number] DOB: [birth year] Today's Date: 09/05/2020 Time: 0932-3557 SLP Time Calculation (min) (ACUTE ONLY): 40 min  Assessment / Plan / Recommendation Clinical Impression  Pt seen today for ongoing assessment of toleration of current dysphagia diet; potential trials to upgrade foods in diet consistency. Pt continues to present w/ declined Cognitive status and function w/ much delayed verbal responses lacking detail -- she directly answered basic questions re: self but offered no further information or engaged verbally. Mod verbal/tactile/visual cues given to engage pt during po trials, as well as encourage swallowing during trials.  Pt continues to present w/ oropharyngeal phase dysphagia d/t declined Cognitive status and potential impact from Parkinson's Dis and Bipolar Dis impacting her overall awareness/engagement w/ po tasks which can increase Risk for Aspiration. Pt's risk for aspiration increases also d/t oral phase deficits (holding) but can be reduced when following general aspiration precautions, given cues during po trials, and w/ use of a dysphagia diet and full feeding support/Supervision. She requires Mod oral, tactile/verbal cues for orientation to bolus presentation; feeding support. Pt consumed trials of purees and sips of thin liquids via straw/cup (prior) w/ no immediate, overt clinical s/s of aspiration noted; no decline in phonations when requested, no cough, and no decline in respiratory status during/post trials. However, oral phase deficits were noted c/b Lengthy oral time and A-P transit time, oral holding and a "munching" behavior intermittently, and bolus residue noted in sulci which was cleared by SLP performing oral care after po trials. Given Cues and Time, she completed swallowing and clearing of oral cavity of boluses(majority). Oral holding of Thin liquids was noted as well. A mild  throat clearing was noted in b/t trials - suspect related to increased oral phase time and oral holding.  Recommend continue a Dysphagia level 1(puree) w/ Thin liquids; general aspiration precautions; reduce Distractions during meals. Pills Crushed in Puree for safer swallowing. Support w/ feeding at meals -- check for oral clearing during/post intake. NSG/SW updated. NSG to reconsult ST services if pt exhibits increased Cognitive awareness for readiness to address diet upgrade(foods) while admitted. She may be able to benefit from formal Cognitive assessment at Discharge to determine Safety Awareness and problem-solving. Of note, pt has not been taking Meds for Bipolar Dis prior to admit; Meds for Parkinson's Dis have just been adjusted and schedule to better meet her needs this admit. Noted she was followed by Palliative Care services in the home in Jan. 2022. Precautions posted in room.    HPI HPI: Stacy Pearson is a 60 y.o. female with history of Parkinson's Dis., bipolar disorder, COPD, admitted with somnolence, confusion, agitation and lethargy. Found to have sepsis with unclear/unknown source. MRI 2/17 showed no acute findings, CXR negative for infiltrate. Unsure of pt's Baseline Cognitive functioning. Resides at home w/ Mother assisting in ADLs.      SLP Plan  All goals met (at this time -- f/u at SNF for readiness to attempt diet upgrade)       Recommendations  Diet recommendations: Dysphagia 1 (puree);Thin liquid Liquids provided via: Cup;Straw (monitor oral phase and clearing) Medication Administration: Crushed with puree (for safer swallowing) Supervision: Full supervision/cueing for compensatory strategies;Staff to assist with self feeding Compensations: Minimize environmental distractions;Slow rate;Small sips/bites;Lingual sweep for clearance of pocketing;Multiple dry swallows after each bite/sip;Follow solids with liquid (Check for oral clearing b/t bites, at end) Postural  Changes and/or Swallow Maneuvers: Out of bed for meals;Seated upright  90 degrees;Upright 30-60 min after meal                General recommendations:  (Dietician f/u for support; Palliative Care for [place]) Oral Care Recommendations: Oral care BID;Oral care before and after PO;Staff/trained caregiver to provide oral care Follow up Recommendations: Skilled Nursing facility (TBD by SW) SLP Visit Diagnosis: Dysphagia, oropharyngeal phase (R13.12) (Cognitive decline) Plan: All goals met (at this time -- f/u at SNF for readiness to attempt diet upgrade)       [place], [place], [place] Pathologist Rehab Services [phone] [facility] 09/05/2020, 3:48 PM

## 2020-09-05 NOTE — Progress Notes (Signed)
Pt is kicking her legs and moving legs constantly. Pt with resp rate when still of 24-26, but becomes more tahypnic with moving legs. Pt does not respond to RN verbally when asked if anything is wrong. Skin pwd. Perrl.

## 2020-09-05 NOTE — Progress Notes (Signed)
Rapid Response Consult:  Patient is lying in bed, responsive to voice. Warm to touch, ax temp is 99.48F. VS otherwise unremarkable. No acute distress. Bedside RN to initiate RRT if patient's condition deteriorates.

## 2020-09-05 NOTE — Progress Notes (Signed)
OT Cancellation Note  Patient Details Name: Stacy Pearson MRN: [id number] DOB: July 02, 1961   Cancelled Treatment:    Reason Eval/Treat Not Completed: Patient at procedure or test/ unavailable. Attempted to see pt this PM, however pt currently off floor for EEG. Will attempt at later time/date.  Fredirick Maudlin, OTR/L [place]

## 2020-09-05 NOTE — TOC Initial Note (Signed)
Transition of Care [facility]) - Initial/Assessment Note    Patient Details  Name: Stacy Pearson MRN: [id number] Date of Birth: 17-Oct-1960  Transition of Care [facility]) CM/SW Contact:    Beverly Sessions, RN Phone Number: 09/05/2020, 2:14 PM  Clinical Narrative:                 TOC left VM for patients mother to complete assessment.  Patient still with AMS and only able to speak a couple of words        Patient Goals and CMS Choice        Expected Discharge Plan and Services                                                Prior Living Arrangements/Services                       Activities of Daily Living      Permission Sought/Granted                  Emotional Assessment              Admission diagnosis:  Confusion [R41.0] Severe sepsis with septic shock ([place]) [A41.9, R65.21] Severe sepsis ([place]) [A41.9, R65.20] Altered mental status, unspecified altered mental status type [R41.82] Sepsis, due to unspecified organism, unspecified whether acute organ dysfunction present [facility]) [A41.9] Patient Active Problem List   Diagnosis Date Noted  . Epilepsy ([place]) 08/26/2020  . Depression with anxiety 08/26/2020  . HTN (hypertension) 08/26/2020  . Acute metabolic encephalopathy 17/79/3903  . UTI (urinary tract infection) 08/26/2020  . AKI (acute kidney injury) ([place]) 08/26/2020  . Severe sepsis with septic shock ([place]) 08/26/2020  . Delirium 08/26/2020  . Acute diarrhea 03/08/2018  . Status post cervical spinal fusion 12/31/2017  . Cervical spondylosis with radiculopathy 10/28/2017  . Colon polyp 10/08/2017  . Essential tremor 10/08/2017  . Cervical radiculopathy 09/03/2017  . Allergic rhinitis 12/30/2016  . Sting of hornets, wasps, and bees causing poisoning and toxic reactions 12/30/2016  . Allergic rhinitis due to dogs 12/17/2016  . Asthma, currently active 12/17/2016  . Muscle cramps 11/18/2016  . Sleep disorder 08/25/2016  . At risk for  falls 08/19/2016  . Elevated vitamin B12 level 08/19/2016  . History of fall within past 90 days 08/19/2016  . Nausea and vomiting 08/19/2016  . Parkinson's disease ([place]) 08/19/2016  . Diaphoresis 03/25/2016  . Dry scalp 03/25/2016  . Dry skin 03/25/2016  . Back pain 07/04/2015  . Tremor 01/02/2014  . Asthma 07/28/2013  . COPD (chronic obstructive pulmonary disease) ([place]) 07/28/2013  . Ulnar neuropathy of left upper extremity 06/06/2013  . Meibomian gland dysfunction 11/09/2012  . Raynaud phenomenon 05/03/2012  . Anorexia nervosa with bulimia 01/17/2011  . Basal cell carcinoma of other specified sites of skin 01/17/2011  . Dyspepsia and other specified disorders of function of stomach 01/17/2011  . Generalized anxiety disorder 01/17/2011  . 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 Migraine without aura 01/17/2011  . Other atopic dermatitis and related conditions 01/17/2011  . Bipolar 1 disorder ([place]) 04/28/1995   PCP:  Valera Castle, MD Pharmacy:   [facility] [phone] - [address], [place] AT [facility] [address] [place] [address] Phone:  [phone] Fax: [phone]     Social Determinants of Health (SDOH) Interventions    Readmission Risk Interventions No flowsheet data found.

## 2020-09-05 NOTE — Progress Notes (Signed)
Pharmacy - Antibiotic Dosing (Brief note)  Patient ordered vancomycin 1500mg  IV x 1 2/22 at 18:00.  The dose was not administered.  Discussed with RN today, patient going to HD now so plan to give at end of HD today.   Doreene Eland, PharmD, BCPS.   Work Cell: [phone] 09/05/2020 9:19 AM

## 2020-09-05 NOTE — Progress Notes (Addendum)
[place] at [place] NAME: Stacy Pearson    MR#:  [id number]  DATE OF BIRTH:  08/28/1960  SUBJECTIVE:  patient more awake alert did say a few words. Unable to hold meaningful conversation mother in the room. She ate her magic cup per RN. Mother says she likes yogurt.  REVIEW OF SYSTEMS:   Review of Systems  Unable to perform ROS: Mental status change   Tolerating Diet:pureed Tolerating PT: CIR?STR  DRUG ALLERGIES:   Allergies  Allergen Reactions   Codeine Nausea And Vomiting   Prednisone     VITALS:  Blood pressure (!) 182/96, pulse (!) 111, temperature 98.9 F (37.2 C), temperature source Oral, resp. rate (!) 28, height 5\' 7"  (1.702 m), weight 67.1 kg, SpO2 92 %.  PHYSICAL EXAMINATION:   Physical Examlimited  GENERAL:  60 y.o.-year-old patient lying in the bed with no acute distress.  LUNGS: Normal breath sounds bilaterally, no wheezing, rales, rhonchi. No use of accessory muscles of respiration. permcath + CARDIOVASCULAR: S1, S2 normal. No murmurs, rubs, or gallops.  ABDOMEN: Soft, nontender, nondistended. Bowel sounds present. No organomegaly or mass.  EXTREMITIES: No cyanosis, clubbing or edema b/l.    NEUROLOGIC: no focal deficit moves all extremities spontaneously. PSYCHIATRIC:  patient is alert and awake. Saying one word at a time repeats after me. SKIN: No obvious rash, lesion, or ulcer.   LABORATORY PANEL:  CBC Recent Labs  Lab 09/05/20 0424  WBC 8.7  HGB 9.9*  HCT 29.5*  PLT 313    Chemistries  Recent Labs  Lab 09/01/20 0531 09/02/20 0438 09/03/20 2054 09/04/20 0509 09/05/20 0424  NA 139   < > 140   < > 144  K 3.9   < > 5.0   < > 5.3*  CL 97*   < > 100   < > 104  CO2 22   < > 18*   < > 20*  GLUCOSE 77   < > 85   < > 94  BUN 36*   < > 72*   < > 99*  CREATININE 4.49*   < > 9.14*   < > 11.79*  CALCIUM 8.9   < > 8.9   < > 8.6*  MG 2.2  --   --   --   --   AST  --   --  11*  --   --   ALT  --   --   <5  --   --   ALKPHOS  --   --  72  --   --   BILITOT  --   --  1.2  --   --    < > = values in this interval not displayed.   Cardiac Enzymes No results for input(s): TROPONINI in the last 168 hours. RADIOLOGY:  PERIPHERAL VASCULAR CATHETERIZATION  Result Date: 09/04/2020 See Op Note  DG Chest Port 1 View  Result Date: 09/03/2020 CLINICAL DATA:  Sepsis EXAM: PORTABLE CHEST 1 VIEW COMPARISON:  08/29/2020 FINDINGS: Surgical hardware in the cervical spine. No focal consolidation or effusion. Stable cardiomediastinal silhouette. No pneumothorax. IMPRESSION: No active disease. Electronically Signed   By: Donavan Foil M.D.   On: 09/03/2020 21:28   EEG adult  Result Date: 09/05/2020 Lora Havens, MD     09/05/2020  3:37 PM Patient Name:Stacy Pearson EGB:[id number] Epilepsy Attending:Priyanka Barbra Sarks Referring Physician/Provider:Dr Kerney Elbe Date:09/05/2020 Duration: 25 mins  Patient [id number] F with  ams. EEG to evaluate for seizure  Level of alertness:Awake  AEDs during EEG study:LTG, LRZ  Technical aspects: This EEG study was done with scalp electrodes positioned according to the 10-20 International system of electrode placement. Electrical activity was acquired at a sampling rate of 500Hz  and reviewed with a high frequency filter of 70Hz  and a low frequency filter of 1Hz . EEG data were recorded continuously and digitally stored.  Description:Noposterior dominant rhythm was seen.EEG showed continuous generalized 3 to 6 Hz theta-delta slowing.Photic driving was not seen during photic stimulation. Hyperventilation wasnot performed.   ABNORMALITY -Continuousslow, generalized  IMPRESSION: This study is suggestive of moderate diffuse encephalopathy, nonspecific etiology. No seizures orepileptiform discharges were seen throughout the recording.  Priyanka Barbra Sarks  ASSESSMENT AND PLAN:  Stacy Schuenemann [name] a 60 y.o.femalewith medical history significant  ofbipolar, depression, anxiety, Parkinson, epilepsy, HTN, COPD, presents with AMS. On presentation patient was confused and agitated.  Admitting provider was able to contact her mother who is the only contact listed, and according to her she lives alone and was at her normal state of health until Friday afternoon. She was sleeping most of the time since then and found to be more confused and somnolent and EMS was called.  Acute Metabolic encephalopathy   --Present on admission suspected due to polypharmacy and possible overdose --Significant improvement in her mental status with dialysis but since transfer out of ICU 2 days ago she was more sleepy so neurology reconsulted on 2/21 and they have reduced /switched Sinemet IR 1 tablet p.o. 3 times daily. --2/23--Patient is more awake today. Tells me her name. Says hello, good morning and repeats a few words. Tells me she is hungry --Antibiotics discontinued per ID as urine, blood and CSF culture remain negative --2/22--Patient had a T-max of 100.9 this a.m. with tachycardia concerning for development of infection. Empirically on vancomycin  AKI -dialysis per nephrology.  Could not get dialyzed yesterday due to nonfunctioning temporary cath.  --s/- Permacath  per vascular surgery and on  dialysis  Acute hypoxic respiratory failure Now resolved, weaned off BiPAP and on nasal cannula oxygen. --pt on RA  Hyperkalemia.  Resolved  Most likely secondary to worsening renal function.   Concern of renal tubular acidosis.  Bipolar 1 disorder with depression and anxiety.  For now hold Ativan as she is sleepy.  Psychiatry signed off.  Dr. Weber Cooks did reevaluate her on 2/21  COPD.  Appears stable, no wheezing and chest x-ray without any acute abnormality. -As needed bronchodilators  History of Parkinson's disease.  Unknown baseline -2/21--Neurology changing Sinemet 1 tablet p.o. 3 times daily  -- Patient is more awake today --Continue with  propranolol-do not know whether tremors are essential or secondary to underlying Parkinson's.  History of seizure disorder  Seizure ruled out, EEG negative for any epileptiform waves On  Lamictal  Hypertension.  Blood pressure running high On home propranolol. -Continue hydralazine to 25 mg p.o. every 6 hours and  IV as needed  Generalized weakness PT, OT recommends CIR.  TOC aware. Pt may not be able to participate three hours intense rehab. Will continue to monitor. She may be a candidate for SNF  Palliative care consulted.  Procedures: Family communication :mother in the room Consults :ID nephrololgy CODE STATUS: DNR DVT Prophylaxis : heparin Level of care: Med-Surg Status is: Inpatient  Patient remains in house since Joe started on dialysis. She will need outpatient dialysis chair time and evaluation whether she will be able to pursue CIR versus  STR. Given her mentation improving very slowly she may be a candidate for STIR      TOTAL TIME TAKING CARE OF THIS PATIENT: 25 minutes.  >50% time spent on counselling and coordination of care  Note: This dictation was prepared with Dragon dictation along with smaller phrase technology. Any transcriptional errors that result from this process are unintentional.  Fritzi Mandes M.D    [facility]   CC: Primary care physician; Valera Castle, MDPatient ID: Stacy Pearson, female   DOB: 05-04-1961, 60 y.o.   MRN: [id number]

## 2020-09-05 NOTE — Procedures (Signed)
Patient Name:Stacy Pearson OIZ:[id number] Epilepsy Attending:Trever Streater Barbra Sarks Referring Physician/Provider:Dr Kerney Elbe Date:09/05/2020 Duration: 25 mins  Patient [name] F with ams. EEG to evaluate for seizure  Level of alertness:Awake  AEDs during EEG study:LTG, LRZ  Technical aspects: This EEG study was done with scalp electrodes positioned according to the 10-20 International system of electrode placement. Electrical activity was acquired at a sampling rate of 500Hz  and reviewed with a high frequency filter of 70Hz  and a low frequency filter of 1Hz . EEG data were recorded continuously and digitally stored.   Description:Noposterior dominant rhythm was seen.EEG showed continuous generalized 3 to 6 Hz theta-delta slowing.Photic driving was not seen during photic stimulation. Hyperventilation wasnot performed.   ABNORMALITY -Continuousslow, generalized  IMPRESSION: This study is suggestive of moderate diffuse encephalopathy, nonspecific etiology. No seizures orepileptiform discharges were seen throughout the recording.  Cono Gebhard Barbra Sarks

## 2020-09-05 NOTE — Progress Notes (Signed)
PT Cancellation Note  Patient Details Name: MEEGHAN SKIPPER MRN: [id number] DOB: 12-19-1960   Cancelled Treatment:    Reason Eval/Treat Not Completed: Patient remains out of room at procedure and unavailable to participate with PT services, will attempt to see pt at a future date/time as medically appropriate.    Linus Salmons PT, DPT 09/05/20, 4:25 PM

## 2020-09-05 NOTE — Progress Notes (Signed)
[place], [place] 09/05/20  Subjective:  [name] is a 60 y.o. white female with  bipolar, depression, anxiety, parkinson's, epilepsy, HTN, and COPD. She presents to the ED with AMS. Concern for overdose (cariprazine+alprazolam) versus urinary tract infection with sepsis.   Acute dialysis while inpatient  has completed 3 treatments  Patient seen resting in bed Alert able to answer simple questions    Objective:  Vital signs in last 24 hours:  Temp:  [97.7 F (36.5 C)-99.5 F (37.5 C)] 99.4 F (37.4 C) (02/23 1149) Pulse Rate:  [98-107] 107 (02/23 1149) Resp:  [17-21] 20 (02/23 1149) BP: (140-174)/(80-94) 173/94 (02/23 1149) SpO2:  [92 %-100 %] 100 % (02/23 1149) Weight:  [67.1 kg] 67.1 kg (02/22 1621)  Weight change:  Filed Weights   08/28/20 0245 08/29/20 0337 09/04/20 1621  Weight: 66.8 kg 67.1 kg 67.1 kg    Intake/Output:    Intake/Output Summary (Last 24 hours) at 09/05/2020 1223 Last data filed at 09/05/2020 1019 Gross per 24 hour  Intake 0 ml  Output -  Net 0 ml    Physical Exam: General: NAD, laying in bed  Eyes: Anicteric  Lungs:  Clear to auscultation  Heart: Regular rate and rhythm  Abdomen:  Soft, nontender,   Extremities:  + peripheral edema.  Neurologic: Alert, delayed response to simple questions  Skin: No lesions, dry  Access: Left IJ Permcath    Basic Metabolic Panel:  Recent Labs  Lab 08/30/20 1000 08/31/20 0911 09/01/20 0531 09/02/20 0438 09/03/20 0529 09/03/20 2054 09/04/20 0509 09/05/20 0424  NA 143 141 139 137 140 140 142 144  K 4.1 4.1 3.9 3.9 5.1 5.0 4.9 5.3*  CL 101 100 97* 98 100 100 101 104  CO2 22 20* 22 24 19* 18* 22 20*  GLUCOSE 80 72 77 114* 77 85 83 94  BUN 77* 50* 36* 54* 63* 72* 79* 99*  CREATININE 7.49* 6.00* 4.49* 6.31* 7.97* 9.14* 10.02* 11.79*  CALCIUM 8.8* 8.9 8.9 9.0 9.0 8.9 9.1 8.6*  MG  --   --  2.2  --   --   --   --   --   PHOS 7.7* 7.0* 6.9*  --   --   --    --   --      CBC: Recent Labs  Lab 09/01/20 0531 09/02/20 0438 09/03/20 0529 09/03/20 2054 09/05/20 0424  WBC 7.5 6.8 8.4 7.7 8.7  NEUTROABS  --   --   --  6.0  --   HGB 9.7* 11.3* 13.5 10.2* 9.9*  HCT 29.4* 33.4* 42.8 31.3* 29.5*  MCV 91.6 90.3 94.9 91.5 91.9  PLT 237 267 308 340 313      Lab Results  Component Value Date   HEPBSAG NON REACTIVE 08/29/2020   HEPBSAB NON REACTIVE 08/29/2020   HEPBIGM NON REACTIVE 08/29/2020      Microbiology:  Recent Results (from the past 240 hour(s))  CSF culture w Gram Stain     Status: None   Collection Time: 08/28/20 12:01 PM   Specimen: PATH Cytology CSF; Cerebrospinal Fluid  Result Value Ref Range Status   Specimen Description   Final    CSF Performed at [facility], [address] [place], [place] [address]    Special Requests   Final    NONE Performed at [facility], [address], [place],  [address]    Gram Stain   Final  WBC SEEN RED BLOOD CELLS NO ORGANISMS SEEN Performed at [facility], [address], [place], [place] [address]    Culture   Final    NO GROWTH Performed at [facility], [place] [address] [place], [place] [address]    Report Status 08/31/2020 FINAL  Final  Culture, fungus without smear     Status: None (Preliminary result)   Collection Time: 08/28/20 12:01 PM   Specimen: PATH Cytology CSF; Cerebrospinal Fluid  Result Value Ref Range Status   Specimen Description   Final    CSF Performed at [facility], [address], [place], [place] [address]    Special Requests NONE  Final   Culture   Final    NO FUNGUS ISOLATED AFTER 8 DAYS Performed at [facility], [place] [address], [place], [place] [address]    Report Status PENDING  Incomplete  Culture, blood (x 2)     Status: None (Preliminary result)   Collection Time: 09/03/20  8:54 PM   Specimen: BLOOD  Result Value Ref Range Status   Specimen Description BLOOD BLOOD RIGHT  FOREARM  Final   Special Requests   Final    BOTTLES DRAWN AEROBIC AND ANAEROBIC Blood Culture adequate volume   Culture   Final    NO GROWTH 2 DAYS Performed at [facility], [address], [place], [place] [address]    Report Status PENDING  Incomplete  Culture, blood (x 2)     Status: None (Preliminary result)   Collection Time: 09/03/20  8:56 PM   Specimen: BLOOD  Result Value Ref Range Status   Specimen Description BLOOD RIGHT ANTECUBITAL  Final   Special Requests   Final    BOTTLES DRAWN AEROBIC AND ANAEROBIC Blood Culture adequate volume   Culture   Final    NO GROWTH 2 DAYS Performed at [facility], [address] [place], [place] [address]    Report Status PENDING  Incomplete    Coagulation Studies: Recent Labs    09/03/20 26-Oct-2052  LABPROT 14.9  INR 1.2    Urinalysis: Recent Labs    09/03/20 2300  COLORURINE YELLOW*  LABSPEC 1.009  PHURINE 5.0  GLUCOSEU NEGATIVE  HGBUR SMALL*  BILIRUBINUR NEGATIVE  KETONESUR 5*  PROTEINUR >=300*  NITRITE NEGATIVE  LEUKOCYTESUR SMALL*      Imaging: PERIPHERAL VASCULAR CATHETERIZATION  Result Date: 09/04/2020 See Op Note  DG Chest Port 1 View  Result Date: 09/03/2020 CLINICAL DATA:  Sepsis EXAM: PORTABLE CHEST 1 VIEW COMPARISON:  08/29/2020 FINDINGS: Surgical hardware in the cervical spine. No focal consolidation or effusion. Stable cardiomediastinal silhouette. No pneumothorax. IMPRESSION: No active disease. Electronically Signed   By: Donavan Foil M.D.   On: 09/03/2020 21:28     Medications:   . sodium chloride Stopped (09/03/20 1242/10/27)  . ceFEPime (MAXIPIME) IV    . vancomycin     . carbidopa-levodopa  1 tablet Oral TID  . Chlorhexidine Gluconate Cloth  6 each Topical Q0600  . vitamin B-12  1,000 mcg Oral Daily   Or  . cyanocobalamin  1,000 mcg Intramuscular Daily  . feeding supplement (NEPRO CARB STEADY)  237 mL Oral BID BM  . heparin injection (subcutaneous)  5,000 Units Subcutaneous  Q8H  . hydrALAZINE  25 mg Oral Q6H  . Ipratropium-Albuterol  1 puff Inhalation Q6H  . lamoTRIgine  25 mg Oral BID  . multivitamin  1 tablet Oral QHS  . propranolol ER  60 mg  Oral Daily   sodium chloride, acetaminophen **OR** acetaminophen, albuterol, hydrALAZINE, ondansetron (ZOFRAN) IV, ondansetron (ZOFRAN) IV  Assessment/ Plan:  60 y.o. female with PMHX of bipolar, depression, anxiety, parkinson's, epilepsy, HTN, and COPD was admitted on 08/26/2020   Principal Problem:   Delirium Active Problems:   Bipolar 1 disorder (HCC)   COPD (chronic obstructive pulmonary disease) (HCC)   Essential tremor   Migraine without aura   Parkinson's disease ([place])   Epilepsy ([place])   Depression with anxiety   HTN (hypertension)   Acute metabolic encephalopathy   UTI (urinary tract infection)   AKI (acute kidney injury) ([place])   Severe sepsis with septic shock (HCC)  Confusion [R41.0] Severe sepsis with septic shock ([place]) [A41.9, R65.21] Severe sepsis ([place]) [A41.9, R65.20] Altered mental status, unspecified altered mental status type [R41.82] Sepsis, due to unspecified organism, unspecified whether acute organ dysfunction present ([place]) [A41.9]  #. Acute kidney injury with hyperkalemia and metabolic acidosis: baseline creatinine 0.8 with normal GFR on 01/31/2020 - monitor volume status.   No intake/output data recorded.   Lab Results  Component Value Date   CREATININE 11.79 (H) 09/05/2020   CREATININE 10.02 (H) 09/04/2020   CREATININE 9.14 (H) 09/03/2020   Creatinine continues to worsen Potassium currently 5.3 Left IJ permcath placed by Vascular yesterday Dialysis scheduled for today Labs will start to correct with treatment   # Anion gap metabolic acidosis: Salicylate levels negative, acetaminophen levels negative.  Lactic acidosis has resolved  Most likely due to uremia/AKI    # Hypernatremia: secondary to free water deficit.  - Currently-144 -continues to remain stable   LOS:  [place] 2/23/202212:23 [place], [place]

## 2020-09-06 ENCOUNTER — Encounter: Payer: Self-pay | Admitting: Vascular Surgery

## 2020-09-06 DIAGNOSIS — G928 Other toxic encephalopathy: Secondary | ICD-10-CM

## 2020-09-06 DIAGNOSIS — T428X5A Adverse effect of antiparkinsonism drugs and other central muscle-tone depressants, initial encounter: Secondary | ICD-10-CM

## 2020-09-06 DIAGNOSIS — T424X5A Adverse effect of benzodiazepines, initial encounter: Secondary | ICD-10-CM | POA: Diagnosis not present

## 2020-09-06 DIAGNOSIS — Z515 Encounter for palliative care: Secondary | ICD-10-CM

## 2020-09-06 DIAGNOSIS — G9341 Metabolic encephalopathy: Secondary | ICD-10-CM | POA: Diagnosis not present

## 2020-09-06 DIAGNOSIS — Z7189 Other specified counseling: Secondary | ICD-10-CM

## 2020-09-06 DIAGNOSIS — F319 Bipolar disorder, unspecified: Secondary | ICD-10-CM | POA: Diagnosis not present

## 2020-09-06 DIAGNOSIS — N179 Acute kidney failure, unspecified: Secondary | ICD-10-CM | POA: Diagnosis not present

## 2020-09-06 DIAGNOSIS — J449 Chronic obstructive pulmonary disease, unspecified: Secondary | ICD-10-CM | POA: Diagnosis not present

## 2020-09-06 DIAGNOSIS — R410 Disorientation, unspecified: Secondary | ICD-10-CM | POA: Diagnosis not present

## 2020-09-06 DIAGNOSIS — R509 Fever, unspecified: Secondary | ICD-10-CM | POA: Diagnosis not present

## 2020-09-06 LAB — MISC LABCORP TEST (SEND OUT): Labcorp test code: 9985

## 2020-09-06 LAB — CBC
HCT: 28.1 % — ABNORMAL LOW (ref 36.0–46.0)
Hemoglobin: 9.2 g/dL — ABNORMAL LOW (ref 12.0–15.0)
MCH: 30.4 pg (ref 26.0–34.0)
MCHC: 32.7 g/dL (ref 30.0–36.0)
MCV: 92.7 fL (ref 80.0–100.0)
Platelets: 335 10*3/uL (ref 150–400)
RBC: 3.03 MIL/uL — ABNORMAL LOW (ref 3.87–5.11)
RDW: 14.2 % (ref 11.5–15.5)
WBC: 9.5 10*3/uL (ref 4.0–10.5)
nRBC: 0 % (ref 0.0–0.2)

## 2020-09-06 LAB — RENAL FUNCTION PANEL
Albumin: 3 g/dL — ABNORMAL LOW (ref 3.5–5.0)
Anion gap: 13 (ref 5–15)
BUN: 58 mg/dL — ABNORMAL HIGH (ref 6–20)
CO2: 27 mmol/L (ref 22–32)
Calcium: 8.6 mg/dL — ABNORMAL LOW (ref 8.9–10.3)
Chloride: 98 mmol/L (ref 98–111)
Creatinine, Ser: 6.83 mg/dL — ABNORMAL HIGH (ref 0.44–1.00)
GFR, Estimated: 6 mL/min — ABNORMAL LOW (ref >60)
Glucose, Bld: 127 mg/dL — ABNORMAL HIGH (ref 70–99)
Phosphorus: 7.4 mg/dL — ABNORMAL HIGH (ref 2.5–4.6)
Potassium: 3.9 mmol/L (ref 3.5–5.1)
Sodium: 138 mmol/L (ref 135–145)

## 2020-09-06 LAB — CRYPTOCOCCUS ANTIGEN, SERUM: Cryptococcus Antigen, Serum: NEGATIVE

## 2020-09-06 LAB — GLUCOSE, CAPILLARY: Glucose-Capillary: 100 mg/dL — ABNORMAL HIGH (ref 70–99)

## 2020-09-06 MED ORDER — HEPARIN SODIUM (PORCINE) 1000 UNIT/ML IJ SOLN
2000.0000 [IU] | Freq: Once | INTRAMUSCULAR | Status: DC
  Filled 2020-09-06: qty 2

## 2020-09-06 MED ORDER — AMLODIPINE BESYLATE 10 MG PO TABS
10.0000 mg | ORAL_TABLET | Freq: Every day | ORAL | Status: DC
  Administered 2020-09-07 – 2020-09-10 (×3): 10 mg via ORAL
  Filled 2020-09-06 (×3): qty 1

## 2020-09-06 MED ORDER — HYDRALAZINE HCL 50 MG PO TABS
50.0000 mg | ORAL_TABLET | Freq: Two times a day (BID) | ORAL | Status: DC
  Administered 2020-09-07 – 2020-09-10 (×6): 50 mg via ORAL
  Filled 2020-09-06 (×6): qty 1

## 2020-09-06 NOTE — Progress Notes (Signed)
Subjective: Patient seen in dialysis. She is substantially improved from yesterday, interacting with staff, following instructions and speaking in full sentences with bright affect.   Objective: Current vital signs: BP (!) 155/82 (BP Location: Right Leg)   Pulse 98   Temp 98.7 F (37.1 C) (Oral)   Resp 18   Ht 5\' 7"  (1.702 m)   Wt 67.1 kg   SpO2 99%   BMI 23.17 kg/m  Vital signs in last 24 hours: Temp:  [98.7 F (37.1 C)-100.2 F (37.9 C)] 98.7 F (37.1 C) (02/24 0752) Pulse Rate:  [97-111] 98 (02/24 0752) Resp:  [18-31] 18 (02/24 0752) BP: (141-182)/(78-97) 155/82 (02/24 0752) SpO2:  [92 %-100 %] 99 % (02/24 0752)  Intake/Output from previous day: 02/23 0701 - 02/24 0700 In: 103.6 [I.V.:3.6; IV Piggyback:100] Out: 1000  Intake/Output this shift: No intake/output data recorded. Nutritional status:  Diet Order            DIET - DYS 1 Room service appropriate? Yes with Assist; Fluid consistency: Thin; Fluid restriction: 1500 mL Fluid  Diet effective now                HEENT: [place]/AT Lungs: Respirations unlabored Ext: Warm and well perfused  Neurologic Exam: Mental Status: Awake and alert. Oriented to the day, month, year, city and state. Pleasant and cooperative but with somewhat odd, flattened affect. Speech is fluent. Answers all questions with relatively short replies but the information contained in her verbalizations is relevant and correct. No dysarthria. Able to follow all commands. Naming intact. Substantially improved from yesterday.  Cranial Nerves: II:  Will fixate on examiner's face, but generally with poor eye contact. Pupils are equal.  III,IV, VI: No ptosis. EOMI. No nystagmus.  V,VII: Temp sensation equal bilaterally. Smile symmetric, but with her neutral/baseline expression there is some incidental facial asymmetry. VIII: Hearing intact to conversation IX,X: No hypophonia XI: Symmetric XII: Midline tongue extension  Motor: Right : Upper extremity    4+/5    Left:     Upper extremity   4-/5 grip Lower extremity   4+/5    Lower extremity   4+/5 Sensory: Light touch intact throughout, bilaterally Deep Tendon Reflexes:  3+ bilateral patellae    Lab Results: Results for orders placed or performed during the hospital encounter of 08/26/20 (from the past 48 hour(s))  CBC     Status: Abnormal   Collection Time: 09/05/20  4:24 AM  Result Value Ref Range   WBC 8.7 4.0 - 10.5 K/uL   RBC 3.21 (L) 3.87 - 5.11 MIL/uL   Hemoglobin 9.9 (L) 12.0 - 15.0 g/dL   HCT 29.5 (L) 36.0 - 46.0 %   MCV 91.9 80.0 - 100.0 fL   MCH 30.8 26.0 - 34.0 pg   MCHC 33.6 30.0 - 36.0 g/dL   RDW 14.2 11.5 - 15.5 %   Platelets 313 150 - 400 K/uL   nRBC 0.0 0.0 - 0.2 %    Comment: Performed at [facility], [place], [place] [address]  Basic metabolic panel     Status: Abnormal   Collection Time: 09/05/20  4:24 AM  Result Value Ref Range   Sodium 144 135 - 145 mmol/L   Potassium 5.3 (H) 3.5 - 5.1 mmol/L   Chloride 104 98 - 111 mmol/L   CO2 20 (L) 22 - 32 mmol/L   Glucose, Bld 94 70 - 99 mg/dL    Comment: Glucose reference range applies only to samples  taken after fasting for at least 8 hours.   BUN 99 (H) 6 - 20 mg/dL   Creatinine, Ser 11.79 (H) 0.44 - 1.00 mg/dL   Calcium 8.6 (L) 8.9 - 10.3 mg/dL   GFR, Estimated 3 (L) >60 mL/min    Comment: (NOTE) Calculated using the CKD-EPI Creatinine Equation (2021)    Anion gap 20 (H) 5 - 15    Comment: Performed at [facility], [place] [place], [place] [address]  Glucose, capillary     Status: Abnormal   Collection Time: 09/05/20 11:57 PM  Result Value Ref Range   Glucose-Capillary 100 (H) 70 - 99 mg/dL    Comment: Glucose reference range applies only to samples taken after fasting for at least 8 hours.    Recent Results (from the past 240 hour(s))  CSF culture w Gram Stain     Status: None   Collection Time: 08/28/20 12:01 PM   Specimen: PATH Cytology CSF; Cerebrospinal  Fluid  Result Value Ref Range Status   Specimen Description   Final    CSF Performed at [facility], [address] [place], [place] [address]    Special Requests   Final    NONE Performed at [facility], [place] [address], [place], [place] [address]    Gram Stain   Final    WBC SEEN RED BLOOD CELLS NO ORGANISMS SEEN Performed at [facility], [address] [place], [place] [address]    Culture   Final    NO GROWTH Performed at [facility], [place] [address] [place], [place] [address]    Report Status 08/31/2020 FINAL  Final  Culture, fungus without smear     Status: None (Preliminary result)   Collection Time: 08/28/20 12:01 PM   Specimen: PATH Cytology CSF; Cerebrospinal Fluid  Result Value Ref Range Status   Specimen Description   Final    CSF Performed at [facility], [address], [place], [place] [address]    Special Requests NONE  Final   Culture   Final    NO FUNGUS ISOLATED AFTER 9 DAYS Performed at [facility], [place] [address] [place], [place] [address]    Report Status PENDING  Incomplete  Culture, blood (x 2)     Status: None (Preliminary result)   Collection Time: 09/03/20  8:54 PM   Specimen: BLOOD  Result Value Ref Range Status   Specimen Description BLOOD BLOOD RIGHT FOREARM  Final   Special Requests   Final    BOTTLES DRAWN AEROBIC AND ANAEROBIC Blood Culture adequate volume   Culture   Final    NO GROWTH 3 DAYS Performed at [facility], [address] [place], [place] [address]    Report Status PENDING  Incomplete  Culture, blood (x 2)     Status: None (Preliminary result)   Collection Time: 09/03/20  8:56 PM   Specimen: BLOOD  Result Value Ref Range Status   Specimen Description BLOOD RIGHT ANTECUBITAL  Final   Special Requests   Final    BOTTLES DRAWN AEROBIC AND ANAEROBIC Blood Culture adequate volume   Culture   Final    NO GROWTH 3 DAYS Performed at [facility],  [address] [place], [place] [address]    Report Status PENDING  Incomplete    Lipid Panel No results for input(s): CHOL, TRIG, HDL, CHOLHDL, VLDL, LDLCALC in the last 72 hours.  Studies/Results: MR BRAIN WO  CONTRAST  Result Date: 09/05/2020 CLINICAL DATA:  Parkinson's disease and epilepsy. Confusion. Agitation. Mental status changes. EXAM: MRI HEAD WITHOUT CONTRAST TECHNIQUE: Multiplanar, multiecho pulse sequences of the brain and surrounding structures were obtained without intravenous contrast. COMPARISON:  08/28/2020 FINDINGS: Brain: Diffusion imaging does not show any acute or subacute infarction. There is abnormal cortical and subcortical edema in a symmetric pattern affecting both hemispheres in the occipital, posterior temporal, parietal and posterior frontal regions virtually pathognomonic for posterior reversible encephalopathy. No evidence of hemorrhage. No mass effect or shift. Evidence of pre-existing brain pathology. No hydrocephalus. No extra-axial collection. Vascular: Major vessels at the base of the brain show flow. Skull and upper cervical spine: Negative Sinuses/Orbits: Sinuses are clear. Orbits are negative. Small left mastoid effusion. Other: None IMPRESSION: Pattern of cortical and subcortical edema in both hemispheres virtually pathognomonic for posterior reversible encephalopathy. No evidence of hemorrhage or mass effect. Electronically Signed   By: Nelson Chimes M.D.   On: 09/05/2020 21:48   MR CERVICAL SPINE WO CONTRAST  Result Date: 09/05/2020 CLINICAL DATA:  Parkinson's disease. Mental status changes. Sepsis presentation. EXAM: MRI CERVICAL SPINE WITHOUT CONTRAST TECHNIQUE: Multiplanar, multisequence MR imaging of the cervical spine was performed. No intravenous contrast was administered. COMPARISON:  None. FINDINGS: Alignment: No traumatic malalignment. Minimal scoliotic curvature. 1 mm degenerative anterolisthesis C7-T1. Vertebrae: Previous ACDF C5 through C7. Cord:  No cord compression, primary cord lesion or myelomalacia Posterior Fossa, vertebral arteries, paraspinal tissues: Negative Disc levels: Foramen magnum is widely patent. Ordinary osteoarthritis at the C1-2 articulation but no encroachment upon the neural structures. C2-3: Mild facet osteoarthritis on the left.  No stenosis. C3-4: Mild bulging of the disc. Facet osteoarthritis on both sides. No canal stenosis. Mild right foraminal narrowing. C4-5: Endplate osteophytes and bulging of the disc. Facet osteoarthritis on the right. Mild canal narrowing but no compression of the cord. Right foraminal stenosis likely to compress the right C5 nerve. C5 through C7: Previous ACDF has a good appearance with wide patency of the canal and foramina. C7-T1: Facet osteoarthritis right worse than left with 1 mm of anterolisthesis. No compressive canal or foraminal narrowing. IMPRESSION: 1. Previous ACDF C5 through C7 has a good appearance. 2. Degenerative changes above and below the fusion. No compressive canal stenosis. Right foraminal stenosis primarily secondary to facet arthropathy at C4-5 likely to compress the right C5 nerve. Mild right foraminal narrowing at C3-4. Electronically Signed   By: Nelson Chimes M.D.   On: 09/05/2020 21:52   PERIPHERAL VASCULAR CATHETERIZATION  Result Date: 09/04/2020 See Op Note  EEG adult  Result Date: 09/05/2020 Lora Havens, MD     09/05/2020  3:37 PM Patient Name:Stacy Pearson YOV:[id number] Epilepsy Attending:Priyanka Barbra Sarks Referring Physician/Provider:Dr Kerney Elbe Date:09/05/2020 Duration: 25 mins  Patient XAJOINO:67EH F with ams. EEG to evaluate for seizure  Level of alertness:Awake  AEDs during EEG study:LTG, LRZ  Technical aspects: This EEG study was done with scalp electrodes positioned according to the 10-20 International system of electrode placement. Electrical activity was acquired at a sampling rate of 500Hz  and reviewed with a high frequency filter of  70Hz  and a low frequency filter of 1Hz . EEG data were recorded continuously and digitally stored.  Description:Noposterior dominant rhythm was seen.EEG showed continuous generalized 3 to 6 Hz theta-delta slowing.Photic driving was not seen during photic stimulation. Hyperventilation wasnot performed.   ABNORMALITY -Continuousslow, generalized  IMPRESSION: This study is suggestive of moderate diffuse encephalopathy, nonspecific etiology. No seizures orepileptiform discharges were seen throughout  the recording.  Priyanka Barbra Sarks   Medications:  Scheduled: . carbidopa-levodopa  1 tablet Oral TID  . Chlorhexidine Gluconate Cloth  6 each Topical Q0600  . vitamin B-12  1,000 mcg Oral Daily   Or  . cyanocobalamin  1,000 mcg Intramuscular Daily  . feeding supplement (NEPRO CARB STEADY)  237 mL Oral BID BM  . heparin injection (subcutaneous)  5,000 Units Subcutaneous Q8H  . hydrALAZINE  25 mg Oral Q6H  . Ipratropium-Albuterol  1 puff Inhalation Q6H  . lamoTRIgine  25 mg Oral BID  . multivitamin  1 tablet Oral QHS  . propranolol ER  60 mg Oral Daily   Continuous: . sodium chloride Stopped (09/05/20 2309)  . ceFEPime (MAXIPIME) IV Stopped (09/05/20 2250)   Lab and Study review:  LP with WBC count of 39,RBC count of 10. Protein of 64,glucose of 75. CSF Gram stain with no organisms.  CSF culturesnegative.  EEG #1: This study is suggestive of moderate diffuse encephalopathy, nonspecific etiology. Given the morphology and frequency of periodic dischargeswith triphasic morphology are most likely due to toxic-metabolic causes.No seizures ordefiniteepileptiform discharges were seen throughout the recording.  EEG #2: This study is suggestive of moderate diffuse encephalopathy, nonspecific etiology. No seizures orepileptiform discharges were seen throughout the recording.  Repeat EEG from yesterday (2/23): ABNORMALITY -Continuousslow, generalized IMPRESSION: This study is  suggestive of moderate diffuse encephalopathy, nonspecific etiology. No seizures orepileptiform discharges were seen throughout the recording.  MRI brain 2/23: Findings most consistent with PRES, which are new relative to the MRI performed on 2/15  Assessment:60 year old female with waxing and waning level of consciousness varying from lethargy to somnolence to confusion with agitation, then appearing most consistent with catatonia over a period of several days. Substantial improvement in her mentation on neurological examination today (2/24); catatonia has resolved.  - DDx for her presentation includes catatonia induced by benzodiazepine withdrawal (was on Valium 10 mg PRN at home), medication overdose and metabolic encephalopathy due to uremia. PRES seen on MRI yesterday can explain a portion of her clinical course, but this was not seen on MRI early in her admission and therefore cannot account for her initial presenting symptoms. .  - Blood CX ordered on Monday shows no growth x 3 days - MRI on 2/15, which was severely degraded by motion artifact, revealed no acute intracranial abnormality.  - Repeat MRI performed yesterday (2/23) reveals a pattern of symmetrical cortical and subcortical edema in both hemispheres which is virtually pathognomonic for posterior reversible encephalopathy. These findings are new relative to the MRI performed 8 days previously.  - MRI C-spine 2/23: .Previous ACDF C5 through C7 has a good appearance. Degenerative changes above and below the fusion. No compressive canal stenosis. Right foraminal stenosis primarily secondary to facet arthropathy at C4-5 likely to compress the right C5 nerve. Mild right foraminal narrowing at C3-4. - Initial EEGs x 2 showed findings suggestive of a moderate diffuse encephalopathy, most likely due to toxic-metabolic causes.No seizures ordefiniteepileptiform discharges were seen. Repeat EEG from yesterday (2/23): Continuous generalized  slowing is noted, which is suggestive of a moderate diffuse encephalopathy, nonspecific to etiology. No seizures orepileptiform discharges were seen throughout the recording. - CSF culture and VDRL were negative.  - Until Sundayshewas receiving an unusually high total daily dose of Sinemet. Her mentation has improved on a reduced-dosage regimen of Sinemet, but unclear if the lowered dose is contributing to her improvement. She does not appear to have any increase in tremor or rigidity  on the reduced dosage regimen, which should be continued at discharge.  - Given significant improvement without switchingLamictal to an alternate mood stabilizer, it is felt unlikely that rare side effect of aseptic meningitis has occurred in this patient. - Regarding metabolic encephalopathy this is felt to be the major contributor to her encephalopathy, in conjunction with the PRES which is seen on yesterday's MRI. Her mentation has substantially improved with resumption of dialysis.    Recommendations: 1.Continue her lowered total daily levodopa dose with Sinemet IR to 3 pills per day. 2. Continue to monitor for further cognitive improvement with dialysis sessions.    LOS: 11 days   @Electronically  signed: Dr. Kerney Elbe 09/06/2020  8:24 AM

## 2020-09-06 NOTE — Progress Notes (Addendum)
Date of Admission:  08/26/2020      ID: Stacy Pearson is a 60 y.o. female  Principal Problem:   Delirium Active Problems:   Bipolar 1 disorder (HCC)   COPD (chronic obstructive pulmonary disease) (HCC)   Essential tremor   Migraine without aura   Parkinson's disease ([place])   Epilepsy ([place])   Depression with anxiety   HTN (hypertension)   Acute metabolic encephalopathy   UTI (urinary tract infection)   AKI (acute kidney injury) ([place])   Severe sepsis with septic shock (HCC)    Subjective: Awake and alert Getting dialysis Says she is feeling fine and wants to go home   Medications:  . amLODipine  10 mg Oral Daily  . carbidopa-levodopa  1 tablet Oral TID  . Chlorhexidine Gluconate Cloth  6 each Topical Q0600  . vitamin B-12  1,000 mcg Oral Daily   Or  . cyanocobalamin  1,000 mcg Intramuscular Daily  . feeding supplement (NEPRO CARB STEADY)  237 mL Oral BID BM  . heparin injection (subcutaneous)  5,000 Units Subcutaneous Q8H  . heparin sodium (porcine)  2,000 Units Intravenous Once  . hydrALAZINE  50 mg Oral BID  . Ipratropium-Albuterol  1 puff Inhalation Q6H  . lamoTRIgine  25 mg Oral BID  . multivitamin  1 tablet Oral QHS  . propranolol ER  60 mg Oral Daily    Objective: Vital signs in last 24 hours: Temp:  [97.7 F (36.5 C)-100.2 F (37.9 C)] 97.7 F (36.5 C) (02/24 1525) Pulse Rate:  [78-104] 80 (02/24 1830) Resp:  [16-30] 23 (02/24 1830) BP: (141-180)/(80-105) 156/90 (02/24 1830) SpO2:  [92 %-99 %] 96 % (02/24 1227)  PHYSICAL EXAM:  General: Alert, cooperative, no distress, Head: Normocephalic, without obvious abnormality, atraumatic. Eyes: Conjunctivae clear, anicteric sclerae. Pupils are equal ENT Nares normal. No drainage or sinus tenderness. Has a superficial wound on the nasal septum Lips, mucosa, and tongue normal. No Thrush Neck: Supple, symmetrical, no adenopathy, thyroid: non tender no carotid bruit and no JVD. Back: No CVA  tenderness. Lungs: Clear to auscultation bilaterally. No Wheezing or Rhonchi. No rales. Heart: S1-S2 Dialysis catheter on the chest wall Abdomen: Soft, non-tender,not distended. Bowel sounds normal. No masses Extremities: atraumatic, no cyanosis. No edema. No clubbing Skin: No rashes or lesions. Or bruising Lymph: Cervical, supraclavicular normal. Neurologic: Strength and movement upper extremity limited  Lab Results Recent Labs    09/05/20 0424 09/06/20 1522  WBC 8.7 9.5  HGB 9.9* 9.2*  HCT 29.5* 28.1*  NA 144 138  K 5.3* 3.9  CL 104 98  CO2 20* 27  BUN 99* 58*  CREATININE 11.79* 6.83*   Liver Panel Recent Labs    09/03/20 2054 09/06/20 1522  PROT 7.0  --   ALBUMIN 2.7* 3.0*  AST 11*  --   ALT <5  --   ALKPHOS 72  --   BILITOT 1.2  --     Infectious Disease work up  [employer] Blood culture  09/03/2020-NG CSF culture 08/28/2020 no growth Blood culture 08/26/2020 no growth Urine culture 08/26/2020 no growth  CSF Meningitis encephalitis panel negative for the following Haemophilus influenza  E. coli  Listeria Neisseria meningitidis  Streptococcus pneumonia  Streptococcus agalactia CMV Enterovirus HSV 1 HSV-2 HHV-6 Human para echovirus Cryptococcus neoformans VZV VDRL CSF negative HIV nonreactive Studies/Results: MR BRAIN WO CONTRAST  Result Date: 09/05/2020 CLINICAL DATA:  Parkinson's disease and epilepsy. Confusion. Agitation. Mental status changes. EXAM: MRI HEAD WITHOUT CONTRAST TECHNIQUE: Multiplanar,  multiecho pulse sequences of the brain and surrounding structures were obtained without intravenous contrast. COMPARISON:  08/28/2020 FINDINGS: Brain: Diffusion imaging does not show any acute or subacute infarction. There is abnormal cortical and subcortical edema in a symmetric pattern affecting both hemispheres in the occipital, posterior temporal, parietal and posterior frontal regions virtually pathognomonic for posterior reversible encephalopathy. No  evidence of hemorrhage. No mass effect or shift. Evidence of pre-existing brain pathology. No hydrocephalus. No extra-axial collection. Vascular: Major vessels at the base of the brain show flow. Skull and upper cervical spine: Negative Sinuses/Orbits: Sinuses are clear. Orbits are negative. Small left mastoid effusion. Other: None IMPRESSION: Pattern of cortical and subcortical edema in both hemispheres virtually pathognomonic for posterior reversible encephalopathy. No evidence of hemorrhage or mass effect. Electronically Signed   By: Nelson Chimes M.D.   On: 09/05/2020 21:48   MR CERVICAL SPINE WO CONTRAST  Result Date: 09/05/2020 CLINICAL DATA:  Parkinson's disease. Mental status changes. Sepsis presentation. EXAM: MRI CERVICAL SPINE WITHOUT CONTRAST TECHNIQUE: Multiplanar, multisequence MR imaging of the cervical spine was performed. No intravenous contrast was administered. COMPARISON:  None. FINDINGS: Alignment: No traumatic malalignment. Minimal scoliotic curvature. 1 mm degenerative anterolisthesis C7-T1. Vertebrae: Previous ACDF C5 through C7. Cord: No cord compression, primary cord lesion or myelomalacia Posterior Fossa, vertebral arteries, paraspinal tissues: Negative Disc levels: Foramen magnum is widely patent. Ordinary osteoarthritis at the C1-2 articulation but no encroachment upon the neural structures. C2-3: Mild facet osteoarthritis on the left.  No stenosis. C3-4: Mild bulging of the disc. Facet osteoarthritis on both sides. No canal stenosis. Mild right foraminal narrowing. C4-5: Endplate osteophytes and bulging of the disc. Facet osteoarthritis on the right. Mild canal narrowing but no compression of the cord. Right foraminal stenosis likely to compress the right C5 nerve. C5 through C7: Previous ACDF has a good appearance with wide patency of the canal and foramina. C7-T1: Facet osteoarthritis right worse than left with 1 mm of anterolisthesis. No compressive canal or foraminal narrowing.  IMPRESSION: 1. Previous ACDF C5 through C7 has a good appearance. 2. Degenerative changes above and below the fusion. No compressive canal stenosis. Right foraminal stenosis primarily secondary to facet arthropathy at C4-5 likely to compress the right C5 nerve. Mild right foraminal narrowing at C3-4. Electronically Signed   By: Nelson Chimes M.D.   On: 09/05/2020 21:52   EEG adult  Result Date: 09/05/2020 Lora Havens, MD     09/05/2020  3:37 PM Patient Name:Takako D Reas NMM:[id number] Epilepsy Attending:Priyanka Barbra Sarks Referring Physician/Yoshimi Sarr:Dr Kerney Elbe Date:09/05/2020 Duration: 25 mins  Patient history:59yo F with ams. EEG to evaluate for seizure  Level of alertness:Awake  AEDs during EEG study:LTG, LRZ  Technical aspects: This EEG study was done with scalp electrodes positioned according to the 10-20 International system of electrode placement. Electrical activity was acquired at a sampling rate of 500Hz  and reviewed with a high frequency filter of 70Hz  and a low frequency filter of 1Hz . EEG data were recorded continuously and digitally stored.  Description:Noposterior dominant rhythm was seen.EEG showed continuous generalized 3 to 6 Hz theta-delta slowing.Photic driving was not seen during photic stimulation. Hyperventilation wasnot performed.   ABNORMALITY -Continuousslow, generalized  IMPRESSION: This study is suggestive of moderate diffuse encephalopathy, nonspecific etiology. No seizures orepileptiform discharges were seen throughout the recording.  Priyanka O Yadav  There is abnormal cortical and subcortical edema in a symmetric pattern affecting both hemispheres in the occipital,posterior temporal, parietal and posterior frontal regions virtually pathognomonic for posterior reversible  encephalopathy  MRI 08/28/20-Severely motion degraded examination.  Assessment/Plan: Acute metabolic encephalopathy: Combination of medication induced  -cariprazine/Valium/sinemet + Reversible posterior leucoencephalopathy syndrome + /?? unknown toxin ingestion + ?? seizures and AKI  BIPOLAR DISORDER  Mild neutrophilic pleocytosis Extensive work-up of the meningitis encephalitis panel is negative see above.  So her presentation is unlikely to be due to infection Today, patient's mental status is fully recovered    New onset fever is resolved after removing Foley catheter and dialysis catheter. Blood cultures have been negative We will discontinue cefepime  AKI: Present on admission  unclear etiology. undergoing dialysis  ID will sign off call if needed.

## 2020-09-06 NOTE — Progress Notes (Addendum)
Inpatient Rehabilitation Admissions Coordinator  Notified by OT that patient did well with therapy today. I will place an order for a rehab consult per protocol so that an Admissions Coordinator can assess for a possible CIR admit at [facility].  Danne Baxter, RN, MSN Rehab Admissions Coordinator [phone] 09/06/2020 1:10 PM

## 2020-09-06 NOTE — Progress Notes (Signed)
Working on outpatient dialysis placement at [facility].

## 2020-09-06 NOTE — Clinical Social Work Note (Addendum)
Left voicemail for patient's mother. Will discuss CIR vs. SNF when she calls back.  Dayton Scrape, [place]  4:03 pm Speech said mom was at bedside but she was gone by the time CSW got there. Tried calling mom again. Did not leave a second voicemail.  Dayton Scrape, [place]

## 2020-09-06 NOTE — Progress Notes (Signed)
Patient c/o feeling nauseated, UF is off and RN aware

## 2020-09-06 NOTE — Progress Notes (Signed)
   09/05/20 1532 09/05/20 1545 09/05/20 1600  Vitals  Temp 98.9 F (37.2 C)  --   --   Temp Source Oral  --   --   BP (!) 178/95 (!) 170/91 (!) 182/96  Pulse Rate (!) 111 (!) 111 (!) 111  ECG Heart Rate (!) 106 (!) 111 (!) 111  Resp (!) 26 (!) 27 (!) 28  During Hemodialysis Assessment  Blood Flow Rate (mL/min) 300 mL/min 300 mL/min 300 mL/min  Arterial Pressure (mmHg) -150 mmHg -150 mmHg -150 mmHg  Venous Pressure (mmHg) 110 mmHg 110 mmHg 110 mmHg  Transmembrane Pressure (mmHg) 60 mmHg 60 mmHg 60 mmHg  Ultrafiltration Rate (mL/min) 500 mL/min 500 mL/min 500 mL/min  Dialysate Flow Rate (mL/min) 600 ml/min 600 ml/min 600 ml/min  Conductivity: Machine  14 14 14   HD Safety Checks Performed Yes Yes Yes  Dialysis Fluid Bolus Normal Saline  --   --   Bolus Amount (mL) 250 mL  --   --   Intra-Hemodialysis Comments Tx initiated Progressing as prescribed Progressing as prescribed    09/05/20 1615 09/05/20 1630 09/05/20 1645  Vitals  Temp  --   --   --   Temp Source  --   --   --   BP (!) 180/90 (!) 181/89 (!) 174/90  Pulse Rate  --   --   --   ECG Heart Rate (!) 111 (!) 111 (!) 112  Resp (!) 28 (!) 28 (!) 28  During Hemodialysis Assessment  Blood Flow Rate (mL/min) 300 mL/min 300 mL/min 300 mL/min  Arterial Pressure (mmHg) -150 mmHg -150 mmHg -170 mmHg  Venous Pressure (mmHg) 130 mmHg 130 mmHg 120 mmHg  Transmembrane Pressure (mmHg) 60 mmHg 60 mmHg 60 mmHg  Ultrafiltration Rate (mL/min) 500 mL/min 500 mL/min 500 mL/min  Dialysate Flow Rate (mL/min) 600 ml/min 600 ml/min 600 ml/min  Conductivity: Machine  14 14 14   HD Safety Checks Performed Yes Yes Yes  Dialysis Fluid Bolus  --   --   --   Bolus Amount (mL)  --   --   --   Intra-Hemodialysis Comments Progressing as prescribed Progressing as prescribed Progressing as prescribed

## 2020-09-06 NOTE — Progress Notes (Signed)
UF remained off during remainder of treatment RN aware

## 2020-09-06 NOTE — Progress Notes (Signed)
   09/05/20 1700 09/05/20 1715 09/05/20 1730  Vitals  BP (!) 170/95 (!) 165/96 (!) 169/97  ECG Heart Rate (!) 112 (!) 112 (!) 111  Resp (!) 28 (!) 29 (!) 26  During Hemodialysis Assessment  Blood Flow Rate (mL/min) 300 mL/min 300 mL/min 300 mL/min  Arterial Pressure (mmHg) -170 mmHg -170 mmHg -170 mmHg  Venous Pressure (mmHg) 110 mmHg 120 mmHg 120 mmHg  Transmembrane Pressure (mmHg) 60 mmHg 60 mmHg 60 mmHg  Ultrafiltration Rate (mL/min) 500 mL/min 500 mL/min 500 mL/min  Dialysate Flow Rate (mL/min) 600 ml/min 600 ml/min 600 ml/min  Conductivity: Machine  14 14 14   HD Safety Checks Performed Yes Yes Yes  Intra-Hemodialysis Comments Progressing as prescribed Progressing as prescribed Progressing as prescribed    09/05/20 1745 09/05/20 1800 09/05/20 1815  Vitals  BP (!) 172/93 (!) 167/97 (!) 149/94 (pt moving around in bed frequently.)  ECG Heart Rate (!) 111 (!) 111 (!) 115  Resp (!) 28 (!) 27 (!) 31  During Hemodialysis Assessment  Blood Flow Rate (mL/min) 300 mL/min 300 mL/min 300 mL/min  Arterial Pressure (mmHg) -170 mmHg -170 mmHg -180 mmHg  Venous Pressure (mmHg) 120 mmHg 120 mmHg 130 mmHg  Transmembrane Pressure (mmHg) 60 mmHg 60 mmHg 60 mmHg  Ultrafiltration Rate (mL/min) 500 mL/min 500 mL/min 500 mL/min  Dialysate Flow Rate (mL/min) 600 ml/min 600 ml/min 600 ml/min  Conductivity: Machine  14 14 13.9  HD Safety Checks Performed Yes Yes Yes  Intra-Hemodialysis Comments Progressing as prescribed Progressing as prescribed Progressing as prescribed    09/05/20 1830  Vitals  BP (!) 156/89  ECG Heart Rate (!) 113  Resp (!) 31  During Hemodialysis Assessment  Blood Flow Rate (mL/min) 300 mL/min  Arterial Pressure (mmHg) -180 mmHg  Venous Pressure (mmHg) 100 mmHg  Transmembrane Pressure (mmHg) 30 mmHg  Ultrafiltration Rate (mL/min) 500 mL/min  Dialysate Flow Rate (mL/min) 600 ml/min  Conductivity: Machine  13.9  HD Safety Checks Performed Yes  Intra-Hemodialysis Comments  Progressing as prescribed;Tx completed

## 2020-09-06 NOTE — Consult Note (Signed)
[facility] Consult   Reason for Consult: Follow-up for 60 year old woman who has been in a multiday spell of severely altered mental status but who has recovered much functioning within the last day. Referring Physician: Posey Pronto Patient Identification: Stacy Pearson MRN:  [id number] Principal Diagnosis: Delirium Diagnosis:  Principal Problem:   Delirium Active Problems:   Bipolar 1 disorder ([place])   COPD (chronic obstructive pulmonary disease) (HCC)   Essential tremor   Migraine without aura   Parkinson's disease ([place])   Epilepsy ([place])   Depression with anxiety   HTN (hypertension)   Acute metabolic encephalopathy   UTI (urinary tract infection)   AKI (acute kidney injury) ([place])   Severe sepsis with septic shock ([place])   Total Time spent with patient: 30 minutes  Subjective:   Stacy Pearson is a 60 y.o. female patient admitted with "I am confused".  HPI: Patient seen chart reviewed.  I had been advised that her mental status had improved dramatically.  Came by this evening and found the patient awake and interactive.  Vastly improved from the last time I saw her.  Introduced myself.  Patient acknowledges that she had been "asleep" for many days.  She said she felt confused because she did not understand why she wound up in the hospital.  She tells me she has no memory of anything that happened prior to her hospitalization that would have landed her in the hospital.  Patient and I chatted for a while about basic matters of her life.  It is clear that she is still not at her baseline.  It took her several minutes to tell me the names of her dogs and she never could really tell me her address.  She tries to tell stories and is grasping at words and at times not making much sense.  Not psychotic but still a bit encephalopathic.  Nevertheless has been eating.  Basically oriented.  Past Psychiatric History: Patient has a long history of a reported diagnosis of bipolar  possible PTSD and anxiety  Risk to Self:   Risk to Others:   Prior Inpatient Therapy:   Prior Outpatient Therapy:    Past Medical History:  Past Medical History:  Diagnosis Date  . Abdominal pain   . Allergic genetic state   . Anxiety   . Asthma   . Basal cell carcinoma   . Bipolar disorder ([place])   . Cancer ([place])    MOS surgery basal cell face  . COPD (chronic obstructive pulmonary disease) ([place])   . Depression   . Eating disorder   . Eczema   . Encounter for blood transfusion   . Essential tremor   . 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 GERD (gastroesophageal reflux disease)   . Headache   . History of basal cell carcinoma   . Hypertension   . Neuropathy   . Parkinson's disease ([place])   . Parkinson's disease ([place])   . Parkinsonism [facility])     Past Surgical History:  Procedure Laterality Date  . ABDOMINAL HYSTERECTOMY    . ADENOIDECTOMY    . BLADDER SURGERY    . bone grafts    . CATARACT EXTRACTION W/ INTRAOCULAR LENS IMPLANT    . CERVICAL FUSION    . COLONOSCOPY    . COLONOSCOPY WITH ESOPHAGOGASTRODUODENOSCOPY (EGD)    . COLONOSCOPY WITH PROPOFOL N/A 04/28/2018   Procedure: COLONOSCOPY WITH PROPOFOL;  Surgeon: Manya Silvas, MD;  Location[facility] END[facility]COPY;  Service: Endoscopy;  Laterality: N/A;  . DIALYSIS/PERMA CATHETER INSERTION Right 09/04/2020   Procedure: DIALYSIS/PERMA CATHETER INSERTION;  Surgeon: Katha Cabal, MD;  Location: [facility] CV LAB;  Service: Cardiovascular;  Laterality: Right;  . EYE SURGERY    . GRAFT APPLICATION     in neck  . LAPAROSCOPIC BILATERAL SALPINGO OOPHERECTOMY    . NEUROPLASTY / TRANSPOSITION ULNAR NERVE AT ELBOW    . OOPHORECTOMY    . SKIN CANCER EXCISION    . skin grafts   left forearm; hand bil.lower extremeties  . titanium plate    . TONSILLECTOMY    . TUBAL LIGATION     Family History:  Family History  Problem Relation Age of Onset  . Breast cancer Mother   . Lung cancer  Father    Family Psychiatric  History: Unknown Social History:  Social History   Substance and Sexual Activity  Alcohol Use Never     Social History   Substance and Sexual Activity  Drug Use Never    Social History   Socioeconomic History  . Marital status: Single    Spouse name: Not on file  . Number of children: Not on file  . Years of education: Not on file  . Highest education level: Not on file  Occupational History  . Not on file  Tobacco Use  . Smoking status: Former Smoker    Types: Cigarettes    Quit date: 07/14/2006    Years since quitting: 14.1  . Smokeless tobacco: Never Used  Substance and Sexual Activity  . Alcohol use: Never  . Drug use: Never  . Sexual activity: Not on file  Other Topics Concern  . Not on file  Social History Narrative  . Not on file   Social Determinants of Health   Financial Resource Strain: Not on file  Food Insecurity: Not on file  Transportation Needs: Not on file  Physical Activity: Not on file  Stress: Not on file  Social Connections: Not on file   Additional Social History:    Allergies:   Allergies  Allergen Reactions  . Codeine Nausea And Vomiting  . Prednisone     Labs:  Results for orders placed or performed during the hospital encounter of 08/26/20 (from the past 48 hour(s))  CBC     Status: Abnormal   Collection Time: 09/05/20  4:24 AM  Result Value Ref Range   WBC 8.7 4.0 - 10.5 K/uL   RBC 3.21 (L) 3.87 - 5.11 MIL/uL   Hemoglobin 9.9 (L) 12.0 - 15.0 g/dL   HCT 29.5 (L) 36.0 - 46.0 %   MCV 91.9 80.0 - 100.0 fL   MCH 30.8 26.0 - 34.0 pg   MCHC 33.6 30.0 - 36.0 g/dL   RDW 14.2 11.5 - 15.5 %   Platelets 313 150 - 400 K/uL   nRBC 0.0 0.0 - 0.2 %    Comment: Performed at [facility], [address], [place], [place] [address]  Basic metabolic panel     Status: Abnormal   Collection Time: 09/05/20  4:24 AM  Result Value Ref Range   Sodium 144 135 - 145 mmol/L   Potassium 5.3 (H) 3.5 - 5.1  mmol/L   Chloride 104 98 - 111 mmol/L   CO2 20 (L) 22 - 32 mmol/L   Glucose, Bld 94 70 - 99 mg/dL    Comment: Glucose reference range applies only to samples taken after fasting for at least 8 hours.  BUN 99 (H) 6 - 20 mg/dL   Creatinine, Ser 11.79 (H) 0.44 - 1.00 mg/dL   Calcium 8.6 (L) 8.9 - 10.3 mg/dL   GFR, Estimated 3 (L) >60 mL/min    Comment: (NOTE) Calculated using the CKD-EPI Creatinine Equation (2021)    Anion gap 20 (H) 5 - 15    Comment: Performed at [facility], [address] [place], [place] [address]  Cryptococcus Antigen, Serum     Status: None   Collection Time: 09/05/20  4:24 AM  Result Value Ref Range   Cryptococcus Antigen, Serum Negative Negative    Comment: (NOTE) Performed At: [facility] [address] [place], [place] [id number] Rush Farmer MD UK:[id number]   Glucose, capillary     Status: Abnormal   Collection Time: 09/05/20 11:57 PM  Result Value Ref Range   Glucose-Capillary 100 (H) 70 - 99 mg/dL    Comment: Glucose reference range applies only to samples taken after fasting for at least 8 hours.  CBC     Status: Abnormal   Collection Time: 09/06/20  3:22 PM  Result Value Ref Range   WBC 9.5 4.0 - 10.5 K/uL   RBC 3.03 (L) 3.87 - 5.11 MIL/uL   Hemoglobin 9.2 (L) 12.0 - 15.0 g/dL   HCT 28.1 (L) 36.0 - 46.0 %   MCV 92.7 80.0 - 100.0 fL   MCH 30.4 26.0 - 34.0 pg   MCHC 32.7 30.0 - 36.0 g/dL   RDW 14.2 11.5 - 15.5 %   Platelets 335 150 - 400 K/uL   nRBC 0.0 0.0 - 0.2 %    Comment: Performed at [facility] [address] [place], [place] [address]  Renal function panel     Status: Abnormal   Collection Time: 09/06/20  3:22 PM  Result Value Ref Range   Sodium 138 135 - 145 mmol/L   Potassium 3.9 3.5 - 5.1 mmol/L   Chloride 98 98 - 111 mmol/L   CO2 27 22 - 32 mmol/L   Glucose, Bld 127 (H) 70 - 99 mg/dL    Comment: Glucose reference range applies only to samples taken after fasting for at least 8 hours.   BUN  58 (H) 6 - 20 mg/dL   Creatinine, Ser 6.83 (H) 0.44 - 1.00 mg/dL   Calcium 8.6 (L) 8.9 - 10.3 mg/dL   Phosphorus 7.4 (H) 2.5 - 4.6 mg/dL   Albumin 3.0 (L) 3.5 - 5.0 g/dL   GFR, Estimated 6 (L) >60 mL/min    Comment: (NOTE) Calculated using the CKD-EPI Creatinine Equation (2021)    Anion gap 13 5 - 15    Comment: Performed at [facility], [address] [place], [place] [address]    Current Facility-Administered Medications  Medication Dose Route Frequency Provider Last Rate Last Admin  . 0.9 %  sodium chloride infusion   Intravenous PRN Schnier, Dolores Lory, MD   Stopped at 09/05/20 2309  . acetaminophen (TYLENOL) tablet 650 mg  650 mg Oral Q6H PRN Schnier, Dolores Lory, MD       Or  . acetaminophen (TYLENOL) suppository 650 mg  650 mg Rectal Q6H PRN Schnier, Dolores Lory, MD   650 mg at 09/04/20 0825  . albuterol (PROVENTIL) (2.5 MG/3ML) 0.083% nebulizer solution 2.5 mg  2.5 mg Nebulization Q4H PRN Schnier, Dolores Lory, MD   2.5 mg at 09/02/20 2351  . amLODipine (NORVASC) tablet 10 mg  10 mg Oral Daily Fritzi Mandes, MD      .  carbidopa-levodopa (SINEMET IR) 25-100 MG per tablet immediate release 1 tablet  1 tablet Oral TID Schnier, Dolores Lory, MD   1 tablet at 09/06/20 1225  . Chlorhexidine Gluconate Cloth 2 % PADS 6 each  6 each Topical Q0600 Schnier, Dolores Lory, MD   6 each at 09/06/20 [phone]  . vitamin B-12 (CYANOCOBALAMIN) tablet 1,000 mcg  1,000 mcg Oral Daily Schnier, Dolores Lory, MD   1,000 mcg at 09/06/20 1225   Or  . cyanocobalamin ((VITAMIN B-12)) injection 1,000 mcg  1,000 mcg Intramuscular Daily Schnier, Dolores Lory, MD   1,000 mcg at 09/04/20 0941  . feeding supplement (NEPRO CARB STEADY) liquid 237 mL  237 mL Oral BID BM Fritzi Mandes, MD   237 mL at 09/06/20 1408  . heparin injection 5,000 Units  5,000 Units Subcutaneous Q8H Schnier, Dolores Lory, MD   5,000 Units at 09/06/20 9476  . heparin sodium (porcine) injection 2,000 Units  2,000 Units Intravenous Once Colon Flattery, NP       . hydrALAZINE (APRESOLINE) injection 10 mg  10 mg Intravenous Q6H PRN Schnier, Dolores Lory, MD   10 mg at 09/06/20 5465  . hydrALAZINE (APRESOLINE) tablet 50 mg  50 mg Oral BID Fritzi Mandes, MD      . Ipratropium-Albuterol (COMBIVENT) respimat 1 puff  1 puff Inhalation Q6H Schnier, Dolores Lory, MD   1 puff at 09/06/20 1407  . lamoTRIgine (LAMICTAL) tablet 25 mg  25 mg Oral BID Schnier, Dolores Lory, MD   25 mg at 09/06/20 1225  . multivitamin (RENA-VIT) tablet 1 tablet  1 tablet Oral QHS Fritzi Mandes, MD   1 tablet at 09/05/20 2202  . ondansetron (ZOFRAN) injection 4 mg  4 mg Intravenous Q8H PRN Schnier, Dolores Lory, MD   4 mg at 09/06/20 1917  . propranolol ER (INDERAL LA) 24 hr capsule 60 mg  60 mg Oral Daily Schnier, Dolores Lory, MD   60 mg at 09/06/20 1233    Musculoskeletal: Strength & Muscle Tone: decreased Gait & Station: unable to stand Patient leans: N/A  Psychiatric Specialty Exam: Physical Exam Vitals and nursing note reviewed.  Constitutional:      Appearance: She is well-developed and well-nourished.  HENT:     Head: Normocephalic and atraumatic.  Eyes:     Conjunctiva/sclera: Conjunctivae normal.     Pupils: Pupils are equal, round, and reactive to light.  Cardiovascular:     Heart sounds: Normal heart sounds.  Pulmonary:     Effort: Pulmonary effort is normal.  Abdominal:     Palpations: Abdomen is soft.  Musculoskeletal:        General: Normal range of motion.     Cervical back: Normal range of motion.  Skin:    General: Skin is warm and dry.  Neurological:     General: No focal deficit present.     Mental Status: She is alert.  Psychiatric:        Attention and Perception: She is inattentive.        Mood and Affect: Affect is blunt.        Speech: Speech is delayed.        Thought Content: Thought content does not include homicidal or suicidal ideation.        Cognition and Memory: Cognition is impaired. Memory is impaired.     Review of Systems  Constitutional:  Negative.   HENT: Negative.   Eyes: Negative.   Respiratory: Negative.   Cardiovascular: Negative.   Gastrointestinal: Negative.  Musculoskeletal: Negative.   Skin: Negative.   Neurological: Negative.   Psychiatric/Behavioral: Positive for confusion.    Blood pressure (!) 145/84, pulse 83, temperature 98.6 F (37 C), temperature source Oral, resp. rate 20, height 5\' 7"  (1.702 m), weight 67.1 kg, SpO2 98 %.Body mass index is 23.17 kg/m.  General Appearance: Casual  Eye Contact:  Fair  Speech:  Garbled and Slow  Volume:  Decreased  Mood:  Euthymic  Affect:  Constricted  Thought Process:  Disorganized  Orientation:  Other:  Oriented to basic situation in roughly to time but not to the exact details  Thought Content:  Logical  Suicidal Thoughts:  No  Homicidal Thoughts:  No  Memory:  Immediate;   Fair Recent;   Poor Remote;   Fair  Judgement:  Fair  Insight:  Shallow  Psychomotor Activity:  Decreased  Concentration:  Concentration: Poor  Recall:  Poor  Fund of Knowledge:  Fair  Language:  Fair  Akathisia:  No  Handed:  Right  AIMS (if indicated):     Assets:  Desire for Improvement Housing Resilience  ADL's:  Impaired  Cognition:  Impaired,  Mild  Sleep:        Treatment Plan Summary: Plan Patient is greatly improved.  I continue to believe based on circumstantial evidence that this is all a result of an overdose of diazepam and she is finally recovering as the long half-life works the medicine out of her.  Patient however does not have any memory of it and I do not think we can prove it.  Right now she is not showing any active mood or psychotic symptoms but is still a bit encephalopathic.  I would recommend not restarting any of her psychiatric medicines yet at this point as I think it would just muddy the waters of her cognitive improvement.  I will continue to follow.  Disposition: No evidence of imminent risk to self or others at present.   Supportive therapy  provided about ongoing stressors.  Alethia Berthold, MD 09/06/2020 8:50 PM

## 2020-09-06 NOTE — Progress Notes (Signed)
Speech Language Pathology Treatment: Dysphagia  Patient Details Name: Stacy Pearson MRN: [id number] DOB: [birth year] Today's Date: 09/06/2020 Time: 1345-1430 SLP Time Calculation (min) (ACUTE ONLY): 45 min  Assessment / Plan / Recommendation Clinical Impression  Pt seen today for ongoing assessment of toleration of current dysphagia diet; trials to upgrade foods in diet consistency d/t having Dentures now. Mother brought in the Dentures towards end of the session and provided solid foods pt likes. Pt continues to exhibit improved engagement each day and currently presents w/ improved Cognitive status and function w/ much more timely verbal responses w/ more detail -- she directly answered questions re: self w/ min more information and continued verbal engagement. Min verbal/visual cues given during po trials. Pt requires full support w/ feeding d/t UE weakness, hands weak w/ poor grip strength.   Today, pt appears to present w/ grossly adequate oropharyngeal phase swallowing function w/ Dentures in place for mastication of solid foods. Still suspect min potential impact from Parkinson's Dis and Bipolar Dis impacting her overall awareness/engagement during taskswhich can increase Risk for Aspiration. Pt's risk for aspiration can be reduced when following general aspiration precautions, given support/cues during po's, and full feeding support/Supervision. She required much less cueing today during bolus presentation  Consuming po trials fed to her (support w/ cup, utensil). She consumed trials of thin liquids via straw, puree, and solids (peanuts) brought in by Mother w/ no immediate, overt clinical s/s of aspiration noted; no decline in phonations when requested, no cough, and no decline in respiratory status during/post trials. Oral phase was grossly [facility] for bolus management and oral phase time w/ solids; timely A-P transit time w/ thin liquids and purees. Min verbal Cues and Time to ensure oral  clearing via lingual sweeping given though no consistency oral phase residue remained post trials today. Pt seemed more alert and attentive to po trials this session.   Recommend upgrade to a Regular consistency diet (Renal per MD) w/ Thin liquids; discussion of foods of preference d/t "picky eating" at home per Mother; general aspiration precautions; reduce Distractions during meals. Pills Whole in Puree for safer swallowing. Support w/ feeding at meals -- check for oral clearing during/post intake as needed. NSG/SW/MDupdated.  She may be able to benefit from formal Cognitive-linguistic assessment at Discharge to determine Safety Awareness and problem-solving. Of note, pt has not been taking Meds for Bipolar Dis prior to admit per note; Meds for Parkinson's Dis have just recently been adjusted and scheduled to better meet her needs this admit -- this could impact Cognitive function. Noted she was followed by Palliative Care services in the home in Jan. 2022.Precautions posted in room. Pt and Mother agreed.     HPI HPI: Pt is a 60 y.o. female with history of Parkinson's Dis., Bipolar disorder, COPD, admitted with somnolence, confusion, agitation and lethargy. Found to have sepsis with unclear/unknown source. MRI 2/17 showed no acute findings, CXR negative for infiltrate. Resides at home w/ Mother assisting in some ADLs. Followed by Palliative Care services in the Home per chart.      SLP Plan  All goals met       Recommendations  Diet recommendations: Regular;Thin liquid (renal per MD) Liquids provided via: Cup;Straw (monitor) Medication Administration: Whole meds with puree (for safer swallowing) Supervision: Staff to assist with self feeding;Full supervision/cueing for compensatory strategies Compensations: Minimize environmental distractions;Slow rate;Small sips/bites;Lingual sweep for clearance of pocketing;Follow solids with liquid Postural Changes and/or Swallow Maneuvers: Seated upright  90 degrees;Upright 30-60 min  after meal;Out of bed for meals      MD: Please consider changing trach tube to :  (Dietician f/u)         General recommendations:  (Cognitive-linguistic assessment TBD) Oral Care Recommendations: Oral care BID;Oral care before and after PO;Staff/trained caregiver to provide oral care (Dentures) Follow up Recommendations: Inpatient Rehab (TBD) SLP Visit Diagnosis: Dysphagia, unspecified (R13.10) (resolved) Plan: All goals met       GO                  Orinda Kenner, MS, CCC-SLP Speech Language Pathologist Rehab Services [phone] [facility] 09/06/2020, 2:33 PM

## 2020-09-06 NOTE — Progress Notes (Signed)
Occupational Therapy Treatment Patient Details Name: Stacy Pearson MRN: [id number] DOB: 05/05/1961 Today's Date: 09/06/2020    History of present illness Pt admitted to 08/26/20 for OD (cariprazine and alprazolam) vs. UTI with sepsis. On presentation, pt was found to have neutrophilic predominant leukocytosis, lactic acidosis at 6.7, AKI, tachycardic, and tachypneic. CXR with low lung volumes and no infiltrate; HCT negative. RR called on 2/16. PMH significant for: bipolar, depression, anxiety, PD, epilepsy, HTN, and COPD.   OT comments  Stacy Pearson was seen for OT treatment on this date. Upon arrival to room pt reclined in bed requesting to get OOB to chair. Pt demonstrates improved mentation, answers questions and follows conversation appropriately including preemptively answering time orientation question stating "Stacy Pearson is the president." Pt requires MAX cues t/o for posture and safety as pt demonstrates poor insight into body positioning.  MIN A exit L side of bed and demonstrated good sitting balance. MAX A don/doff B socks and mesh underwear seated EOB. MOD A for BSC t/f - assist 2/2 posterior lean. MAX A perihygiene in standing. Pt required MIN Ax2 + HHA for ~20 ft in room mobility. MOD A hand over hand assist for self-drinking seated EOC.  Pt completed BUE seated therex as described below. Pt achieved B full fist and opens to neutral but unable to extend digits. AROM notably improved B shoulder flexion ~40*, AAROM ~80*. Pt instructed in CIR recommendations and reports wanting to return to prior [place] including caring for her 2 dogs. Pt making good progress toward goals. Pt continues to benefit from skilled OT services to maximize return to PLOF and minimize risk of future falls, injury, caregiver burden, and readmission. Will continue to follow POC. Discharge recommendation remains appropriate.    Follow Up Recommendations  CIR    Equipment Recommendations  Other (comment) (TBD at  next venue of care)    Recommendations for Other Services      Precautions / Restrictions Precautions Precautions: Fall Restrictions Weight Bearing Restrictions: No       Mobility Bed Mobility Overal bed mobility: Needs Assistance Bed Mobility: Supine to Sit Rolling: Min assist              Transfers Overall transfer level: Needs assistance Equipment used: 1 person hand held assist Transfers: Sit to/from [facility] Sit to Stand: Mod assist Stand pivot transfers: Mod assist       General transfer comment: assist for posterior lean    Balance Overall balance assessment: Needs assistance Sitting-balance support: Feet unsupported;Single extremity supported Sitting balance-Leahy Scale: Fair Sitting balance - Comments: MIN A to place BUE on BSC arm rests to correct R lateral lean   Standing balance support: No upper extremity supported Standing balance-Leahy Scale: Fair Standing balance comment: posterior lean                           ADL either performed Pearson assessed with clinical judgement   ADL Overall ADL's : Needs assistance/impaired                                       General ADL Comments: MAX A don/doff B socks and mesh underwear seated EOB. MOD A for BSC t/f. MAX A perihygiene in standing. MOD A self-drinking seated EOC.               Cognition Arousal/Alertness: Awake/alert Behavior  During Therapy: Flat affect Overall Cognitive Status: Impaired/Different from baseline Area of Impairment: Following commands;Awareness;Problem solving;Safety/judgement                 Orientation Level: Disoriented to;Situation Current Attention Level: Selective   Following Commands: Follows one step commands with increased time;Follows one step commands inconsistently Safety/Judgement: Decreased awareness of safety;Decreased awareness of deficits   Problem Solving: Slow processing;Decreased  initiation;Difficulty sequencing;Requires verbal cues;Requires tactile cues General Comments: Improved mentation, answers questions and follows conversation appropriately.        Exercises Exercises: Other exercises;General Upper Extremity General Exercises - Upper Extremity Shoulder Flexion: Strengthening;Both;10 reps;Seated;AROM Elbow Flexion: Strengthening;Both;10 reps;Seated;AROM Elbow Extension: Strengthening;Both;10 reps;Seated;AROM Wrist Flexion: Strengthening;Both;10 reps;Seated;AROM Wrist Extension: Strengthening;Both;10 reps;Seated;AROM Digit Composite Flexion: Strengthening;Both;10 reps;Seated;AROM Composite Extension: Strengthening;Both;10 reps;Seated;AROM Other Exercises Other Exercises: Pt educated re: OT role, DME recs, d/c recs, falls prevention, ECS, HEP Other Exercises: LBD, toileting, self-drinking, sup>sit, sit<>stand, sitting/standing balance/tolerance, ~10 ft mobility           Pertinent Vitals/ Pain       Pain Assessment: No/denies pain         Frequency  Min 3X/week        Progress Toward Goals  OT Goals(current goals can now be found in the care plan section)  Progress towards OT goals: Progressing toward goals  Acute Rehab OT Goals Patient Stated Goal: to return to PLOF OT Goal Formulation: With patient/family Time For Goal Achievement: 09/15/20 Potential to Achieve Goals: Good ADL Goals Pt Will Perform Eating: with min assist;bed level;with caregiver independent in assisting Pt Will Perform Upper Body Dressing: with mod assist;sitting Pt Will Transfer to Toilet: stand pivot transfer;bedside commode;with mod assist  Plan Discharge plan remains appropriate;Frequency remains appropriate       AM-PAC OT "6 Clicks" Daily Activity     Outcome Measure   Help from another person eating meals?: A Lot Help from another person taking care of personal grooming?: A Lot Help from another person toileting, which includes using toliet, bedpan, Pearson  urinal?: A Lot Help from another person bathing (including washing, rinsing, drying)?: A Lot Help from another person to put on and taking off regular upper body clothing?: A Lot Help from another person to put on and taking off regular lower body clothing?: A Lot 6 Click Score: 12    End of Session    OT Visit Diagnosis: Other abnormalities of gait and mobility (R26.89);Muscle weakness (generalized) (M62.81)   Activity Tolerance Patient tolerated treatment well   Patient Left in chair;with chair alarm set;with call bell/phone within reach   Nurse Communication Mobility status        Time: 1129-1223 OT Time Calculation (min): 54 min  Charges: OT General Charges $OT Visit: 1 Visit OT Treatments $Self Care/Home Management : 38-52 mins $Therapeutic Exercise: 8-22 mins  Dessie Coma, M.S. OTR/L  09/06/20, 1:55 PM  ascom [phone]

## 2020-09-06 NOTE — Progress Notes (Signed)
Physical Therapy Treatment Patient Details Name: Stacy Pearson MRN: [id number] DOB: [birth year] Today's Date: 09/06/2020    History of Present Illness Pt admitted to 08/26/20 for OD (cariprazine and alprazolam) vs. UTI with sepsis. On presentation, pt was found to have neutrophilic predominant leukocytosis, lactic acidosis at 6.7, AKI, tachycardic, and tachypneic. CXR with low lung volumes and no infiltrate; HCT negative. RR called on 2/16. PMH significant for: bipolar, depression, anxiety, PD, epilepsy, HTN, and COPD.    PT Comments    Patient alert, on BSC at start of session. Overall the patient demonstrated great progress in mobility, but still has significant deficits in safety, mobility, independence, activity tolerance/endurance and an overall decline from PLOF. Several instances of sit <> stand transfers performed, minA to achieve upright positioning, and minA throughout to address deficits in standing balance. Pt often with posterior lean or R lateral lean during mobility.A few instances of CGA but very close guard needed due to sudden losses of balance.  Static standing balance exercises performed with constant assist and cueing to maintain exercise technique and pt safety. Pt also able to perform seated exercises with verbal and visual cueing. Ambulated ~66ft to recliner in prep for dialysis, minA throughout due to staggering and instability, with staff safely in recliner at end of session. The patient would benefit from further skilled PT intervention to continue to progress towards goals. Recommendation remains appropriate to maximize pt independence, safety, and to return to PLOF. Family and patient remain highly motivated.         Follow Up Recommendations  CIR;Supervision/Assistance - 24 hour;Supervision for mobility/OOB     Equipment Recommendations  Other (comment) (TBD at next level of care)    Recommendations for Other Services Rehab consult     Precautions /  Restrictions Precautions Precautions: Fall Restrictions Weight Bearing Restrictions: No    Mobility  Bed Mobility Overal bed mobility: Needs Assistance Bed Mobility: Supine to Sit Rolling: Min assist         General bed mobility comments: pt up in chair at start/end of session    Transfers Overall transfer level: Needs assistance Equipment used: 1 person hand held assist Transfers: Sit to/from Stand;Stand Pivot Transfers Sit to Stand: Min assist Stand pivot transfers: Min assist       General transfer comment: assist for intermittent posterior lean or R lean  Ambulation/Gait   Gait Distance (Feet): 15 Feet Assistive device: 1 person hand held assist       General Gait Details: Pt able to transfer several times, stepped forwards/backwards/lateral several bouts this session, but primarily focused on standing balance   Stairs             Wheelchair Mobility    Modified Rankin (Stroke Patients Only)       Balance Overall balance assessment: Needs assistance Sitting-balance support: Feet unsupported;Single extremity supported Sitting balance-Leahy Scale: Fair Sitting balance - Comments: MIN A to place BUE on BSC arm rests to correct R lateral lean Postural control: Posterior lean;Left lateral lean Standing balance support: No upper extremity supported Standing balance-Leahy Scale: Fair Standing balance comment: varying levels of assistance needed, pt able to stand with CGA with cueing and righting to midline, predominantly minA to maintain balance                            Cognition Arousal/Alertness: Awake/alert Behavior During Therapy: Flat affect Overall Cognitive Status: Impaired/Different from baseline Area of Impairment: Following commands;Awareness;Problem solving;Safety/judgement  Orientation Level: Disoriented to;Situation Current Attention Level: Selective   Following Commands: Follows one step commands  with increased time;Follows one step commands inconsistently Safety/Judgement: Decreased awareness of safety;Decreased awareness of deficits   Problem Solving: Slow processing;Decreased initiation;Difficulty sequencing;Requires verbal cues;Requires tactile cues General Comments: Improved mentation, answers questions and follows conversation appropriately.      Exercises General Exercises - Upper Extremity Shoulder Flexion: Strengthening;Both;10 reps;Seated;AROM Elbow Flexion: Strengthening;Both;10 reps;Seated;AROM Elbow Extension: Strengthening;Both;10 reps;Seated;AROM Wrist Flexion: Strengthening;Both;10 reps;Seated;AROM Wrist Extension: Strengthening;Both;10 reps;Seated;AROM Digit Composite Flexion: Strengthening;Both;10 reps;Seated;AROM Composite Extension: Strengthening;Both;10 reps;Seated;AROM General Exercises - Lower Extremity Long Arc Quad: AROM;Strengthening;Both;10 reps Hip Flexion/Marching: AROM;Strengthening;Both;10 reps Other Exercises Other Exercises: Patient assisted with pericare after BSC; minA to maintain balance and totalA for wiping. 3 sit <> stands from [facility]. Other Exercises: standing balance exercises performed with CGA-modA for posterior or R lateral lean noted    General Comments        Pertinent Vitals/Pain Pain Assessment: No/denies pain    Home Living                      Prior Function            PT Goals (current goals can now be found in the care plan section) Acute Rehab PT Goals Patient Stated Goal: to return to PLOF Progress towards PT goals: Progressing toward goals    Frequency    Min 2X/week      PT Plan Current plan remains appropriate;Frequency needs to be updated    Co-evaluation              AM-PAC PT "6 Clicks" Mobility   Outcome Measure  Help needed turning from your back to your side while in a flat bed without using bedrails?: Total Help needed moving from lying on your back to sitting on the side of a  flat bed without using bedrails?: A Lot Help needed moving to and from a bed to a chair (including a wheelchair)?: A Lot Help needed standing up from a chair using your arms (e.g., wheelchair or bedside chair)?: A Lot Help needed to walk in hospital room?: A Lot Help needed climbing 3-5 steps with a railing? : Total 6 Click Score: 10    End of Session Equipment Utilized During Treatment: Gait belt Activity Tolerance: Patient tolerated treatment well Patient left: in chair;with nursing/sitter in room (assisted to dialysis chair) Nurse Communication: Mobility status PT Visit Diagnosis: Unsteadiness on feet (R26.81);Muscle weakness (generalized) (M62.81);Difficulty in walking, not elsewhere classified (R26.2);Other symptoms and signs involving the nervous system (R29.898)     Time: 1914-7829 PT Time Calculation (min) (ACUTE ONLY): 26 min  Charges:  $Therapeutic Exercise: 8-22 mins $Neuromuscular Re-education: 8-22 mins                     Lieutenant Diego PT, DPT 3:46 PM,09/06/20

## 2020-09-06 NOTE — Progress Notes (Signed)
Initial Nutrition Assessment  DOCUMENTATION CODES:   Not applicable  INTERVENTION:   Nepro Shake po BID, each supplement provides 425 kcal and 19 grams protein  Magic cup BID with meals, each supplement provides 290 kcal and 9 grams of protein (yogurt with breakfast)  Rena-vit po daily  Liberalize diet- Potassium and sodium restrictions via [employer] [facility]   NUTRITION DIAGNOSIS:   Inadequate oral intake related to acute illness as evidenced by meal completion < 50%.  GOAL:   Patient will meet greater than or equal to 90% of their needs  MONITOR:   PO intake,Supplement acceptance,Labs,Weight trends,Skin,I & O's  REASON FOR ASSESSMENT:   Consult Assessment of nutrition requirement/status  ASSESSMENT:   60 y.o. female with medical history significant of bipolar I disorder, depression, anxiety, Parkinsons, epilepsy, HTN and COPD who presents with AMS.   Unable to see pt today after multiple attempts. Pt working with PT during second attempt to see pt. Pt is documented to be eating ~25% of meals in hospital. Pt ate 25% of her lunch today. Pt has drank about 25% of a Nepro supplement that was sitting on her side table. Per family report, pt enjoys yogurt. Pt seen by SLP today and advanced to a regular diet. RD will add supplements and vitamins to help pt meet her estimated needs and replace losses from HD. RD will add potassium and sodium restrictions via [employer] Touch. Palliative care following for GOC. RD will obtain nutrition related history and exam at follow-up.   Per chart, pt appears to be up ~13lbs from her UBW; pt's UBW appears to be ~135lbs.   Medications reviewed and include: B12, heparin, rena-vit, cefepime   Labs reviewed: K 5.3(H), BUN 99(H), creat 11.79(H) Hgb 9.9(L), Hct 29.5(L)  NUTRITION - FOCUSED PHYSICAL EXAM: Unable to perform at this time   Diet Order:   Diet Order            Diet regular Room service appropriate? Yes; Fluid consistency: Thin; Fluid  restriction: 1200 mL Fluid  Diet effective now                EDUCATION NEEDS:   No education needs have been identified at this time  Skin:  Skin Assessment: Reviewed RN Assessment  Last BM:  2/21- TYPE 6  Height:   Ht Readings from Last 1 Encounters:  09/04/20 5\' 7"  (1.702 m)    Weight:   Wt Readings from Last 1 Encounters:  09/04/20 67.1 kg    Ideal Body Weight:  61.36 kg  BMI:  Body mass index is 23.17 kg/m.  Estimated Nutritional Needs:   Kcal:  1600-1800kcal/day  Protein:  80-90g/day  Fluid:  1.8-2.1L/day  Koleen Distance MS, RD, LDN Please refer to [facility] for RD and/or RD on-call/weekend/after hours pager

## 2020-09-06 NOTE — Progress Notes (Signed)
[place], [place] 09/06/20  Subjective:  [name] is a 60 y.o. white female with  bipolar, depression, anxiety, parkinson's, epilepsy, HTN, and COPD. She presents to the ED with AMS. Concern for overdose (cariprazine+alprazolam) versus urinary tract infection with sepsis.   Acute dialysis while inpatient  has completed 3 treatments  Patient seen resting in bed Alert and able to answer questions Understands her she has kidney damage and needs dialysis Denies shortness of breath and chest pain Currently on room air Able to eat breakfast with no nausea    Objective:  Vital signs in last 24 hours:  Temp:  [98.5 F (36.9 C)-100.2 F (37.9 C)] 98.5 F (36.9 C) (02/24 1227) Pulse Rate:  [97-111] 104 (02/24 1227) Resp:  [18-31] 18 (02/24 1227) BP: (141-182)/(78-105) 180/105 (02/24 1227) SpO2:  [92 %-99 %] 96 % (02/24 1227)  Weight change:  Filed Weights   08/28/20 0245 08/29/20 0337 09/04/20 1621  Weight: 66.8 kg 67.1 kg 67.1 kg    Intake/Output:    Intake/Output Summary (Last 24 hours) at 09/06/2020 1406 Last data filed at 09/06/2020 0900 Gross per 24 hour  Intake 143.6 ml  Output 1000 ml  Net -856.4 ml    Physical Exam: General: NAD, laying in bed  Eyes: Anicteric  Lungs:  Clear to auscultation  Heart: Regular rate and rhythm  Abdomen:  Soft, nontender,   Extremities:  + peripheral edema.  Neurologic: Alert, able to answer questions  Skin: No lesions, dry  Access: Left IJ Permcath    Basic Metabolic Panel:  Recent Labs  Lab 08/31/20 0911 09/01/20 0531 09/02/20 0438 09/03/20 0529 09/03/20 2054 09/04/20 0509 09/05/20 0424  NA 141 139 137 140 140 142 144  K 4.1 3.9 3.9 5.1 5.0 4.9 5.3*  CL 100 97* 98 100 100 101 104  CO2 20* 22 24 19* 18* 22 20*  GLUCOSE 72 77 114* 77 85 83 94  BUN 50* 36* 54* 63* 72* 79* 99*  CREATININE 6.00* 4.49* 6.31* 7.97* 9.14* 10.02* 11.79*  CALCIUM 8.9 8.9 9.0 9.0 8.9 9.1 8.6*  MG  --   2.2  --   --   --   --   --   PHOS 7.0* 6.9*  --   --   --   --   --      CBC: Recent Labs  Lab 09/01/20 0531 09/02/20 0438 09/03/20 0529 09/03/20 2054 09/05/20 0424  WBC 7.5 6.8 8.4 7.7 8.7  NEUTROABS  --   --   --  6.0  --   HGB 9.7* 11.3* 13.5 10.2* 9.9*  HCT 29.4* 33.4* 42.8 31.3* 29.5*  MCV 91.6 90.3 94.9 91.5 91.9  PLT 237 267 308 340 313      Lab Results  Component Value Date   HEPBSAG NON REACTIVE 08/29/2020   HEPBSAB NON REACTIVE 08/29/2020   HEPBIGM NON REACTIVE 08/29/2020      Microbiology:  Recent Results (from the past 240 hour(s))  CSF culture w Gram Stain     Status: None   Collection Time: 08/28/20 12:01 PM   Specimen: PATH Cytology CSF; Cerebrospinal Fluid  Result Value Ref Range Status   Specimen Description   Final    CSF Performed at [facility], [address] [place], [place] [address]    Special Requests   Final    NONE Performed at [facility], [address], [place], [place] [address]    Gram Stain  Final    WBC SEEN RED BLOOD CELLS NO ORGANISMS SEEN Performed at [facility], [address], [place], [place] [address]    Culture   Final    NO GROWTH Performed at [facility], [place] [address] [place], [place] [address]    Report Status 08/31/2020 FINAL  Final  Culture, fungus without smear     Status: None (Preliminary result)   Collection Time: 08/28/20 12:01 PM   Specimen: PATH Cytology CSF; Cerebrospinal Fluid  Result Value Ref Range Status   Specimen Description   Final    CSF Performed at [facility], [address], [place], [place] [address]    Special Requests NONE  Final   Culture   Final    NO FUNGUS ISOLATED AFTER 9 DAYS Performed at [facility], [place] [address] [place], [place] [address]    Report Status PENDING  Incomplete  Culture, blood (x 2)     Status: None (Preliminary result)   Collection Time: 09/03/20  8:54 PM   Specimen: BLOOD  Result Value  Ref Range Status   Specimen Description BLOOD BLOOD RIGHT FOREARM  Final   Special Requests   Final    BOTTLES DRAWN AEROBIC AND ANAEROBIC Blood Culture adequate volume   Culture   Final    NO GROWTH 3 DAYS Performed at [facility], [address], [place], [place] [address]    Report Status PENDING  Incomplete  Culture, blood (x 2)     Status: None (Preliminary result)   Collection Time: 09/03/20  8:56 PM   Specimen: BLOOD  Result Value Ref Range Status   Specimen Description BLOOD RIGHT ANTECUBITAL  Final   Special Requests   Final    BOTTLES DRAWN AEROBIC AND ANAEROBIC Blood Culture adequate volume   Culture   Final    NO GROWTH 3 DAYS Performed at [facility], [address], [place], [place] [address]    Report Status PENDING  Incomplete    Coagulation Studies: Recent Labs    09/03/20 October 23, 2052  LABPROT 14.9  INR 1.2    Urinalysis: Recent Labs    09/03/20 2300  COLORURINE YELLOW*  LABSPEC 1.009  PHURINE 5.0  GLUCOSEU NEGATIVE  HGBUR SMALL*  BILIRUBINUR NEGATIVE  KETONESUR 5*  PROTEINUR >=300*  NITRITE NEGATIVE  LEUKOCYTESUR SMALL*      Imaging: MR BRAIN WO CONTRAST  Result Date: 09/05/2020 CLINICAL DATA:  Parkinson's disease and epilepsy. Confusion. Agitation. Mental status changes. EXAM: MRI HEAD WITHOUT CONTRAST TECHNIQUE: Multiplanar, multiecho pulse sequences of the brain and surrounding structures were obtained without intravenous contrast. COMPARISON:  08/28/2020 FINDINGS: Brain: Diffusion imaging does not show any acute or subacute infarction. There is abnormal cortical and subcortical edema in a symmetric pattern affecting both hemispheres in the occipital, posterior temporal, parietal and posterior frontal regions virtually pathognomonic for posterior reversible encephalopathy. No evidence of hemorrhage. No mass effect or shift. Evidence of pre-existing brain pathology. No hydrocephalus. No extra-axial collection. Vascular: Major  vessels at the base of the brain show flow. Skull and upper cervical spine: Negative Sinuses/Orbits: Sinuses are clear. Orbits are negative. Small left mastoid effusion. Other: None IMPRESSION: Pattern of cortical and subcortical edema in both hemispheres virtually pathognomonic for posterior reversible encephalopathy. No evidence of hemorrhage or mass effect. Electronically Signed   By: Nelson Chimes M.D.   On: 09/05/2020 21:48   MR CERVICAL SPINE WO CONTRAST  Result Date: 09/05/2020 CLINICAL DATA:  Parkinson's disease. Mental  status changes. Sepsis presentation. EXAM: MRI CERVICAL SPINE WITHOUT CONTRAST TECHNIQUE: Multiplanar, multisequence MR imaging of the cervical spine was performed. No intravenous contrast was administered. COMPARISON:  None. FINDINGS: Alignment: No traumatic malalignment. Minimal scoliotic curvature. 1 mm degenerative anterolisthesis C7-T1. Vertebrae: Previous ACDF C5 through C7. Cord: No cord compression, primary cord lesion or myelomalacia Posterior Fossa, vertebral arteries, paraspinal tissues: Negative Disc levels: Foramen magnum is widely patent. Ordinary osteoarthritis at the C1-2 articulation but no encroachment upon the neural structures. C2-3: Mild facet osteoarthritis on the left.  No stenosis. C3-4: Mild bulging of the disc. Facet osteoarthritis on both sides. No canal stenosis. Mild right foraminal narrowing. C4-5: Endplate osteophytes and bulging of the disc. Facet osteoarthritis on the right. Mild canal narrowing but no compression of the cord. Right foraminal stenosis likely to compress the right C5 nerve. C5 through C7: Previous ACDF has a good appearance with wide patency of the canal and foramina. C7-T1: Facet osteoarthritis right worse than left with 1 mm of anterolisthesis. No compressive canal or foraminal narrowing. IMPRESSION: 1. Previous ACDF C5 through C7 has a good appearance. 2. Degenerative changes above and below the fusion. No compressive canal stenosis.  Right foraminal stenosis primarily secondary to facet arthropathy at C4-5 likely to compress the right C5 nerve. Mild right foraminal narrowing at C3-4. Electronically Signed   By: Nelson Chimes M.D.   On: 09/05/2020 21:52   PERIPHERAL VASCULAR CATHETERIZATION  Result Date: 09/04/2020 See Op Note  EEG adult  Result Date: 09/05/2020 Lora Havens, MD     09/05/2020  3:37 PM Patient Name:Stacy Pearson TIW:[id number] Epilepsy Attending:Priyanka Barbra Sarks Referring Physician/Provider:Dr Kerney Elbe Date:09/05/2020 Duration: 25 mins  Patient [id number] F with ams. EEG to evaluate for seizure  Level of alertness:Awake  AEDs during EEG study:LTG, LRZ  Technical aspects: This EEG study was done with scalp electrodes positioned according to the 10-20 International system of electrode placement. Electrical activity was acquired at a sampling rate of 500Hz  and reviewed with a high frequency filter of 70Hz  and a low frequency filter of 1Hz . EEG data were recorded continuously and digitally stored.  Description:Noposterior dominant rhythm was seen.EEG showed continuous generalized 3 to 6 Hz theta-delta slowing.Photic driving was not seen during photic stimulation. Hyperventilation wasnot performed.   ABNORMALITY -Continuousslow, generalized  IMPRESSION: This study is suggestive of moderate diffuse encephalopathy, nonspecific etiology. No seizures orepileptiform discharges were seen throughout the recording.  Priyanka Barbra Sarks    Medications:   . sodium chloride Stopped (09/05/20 2309)  . ceFEPime (MAXIPIME) IV Stopped (09/05/20 2250)   . amLODipine  10 mg Oral Daily  . carbidopa-levodopa  1 tablet Oral TID  . Chlorhexidine Gluconate Cloth  6 each Topical Q0600  . vitamin B-12  1,000 mcg Oral Daily   Or  . cyanocobalamin  1,000 mcg Intramuscular Daily  . feeding supplement (NEPRO CARB STEADY)  237 mL Oral BID BM  . heparin injection (subcutaneous)  5,000 Units Subcutaneous  Q8H  . heparin sodium (porcine)  2,000 Units Intravenous Once  . hydrALAZINE  50 mg Oral BID  . Ipratropium-Albuterol  1 puff Inhalation Q6H  . lamoTRIgine  25 mg Oral BID  . multivitamin  1 tablet Oral QHS  . propranolol ER  60 mg Oral Daily   sodium chloride, acetaminophen **OR** acetaminophen, albuterol, hydrALAZINE, ondansetron (ZOFRAN) IV  Assessment/ Plan:  60 y.o. female with PMHX of bipolar, depression, anxiety, parkinson's, epilepsy, HTN, and COPD was admitted on 08/26/2020   Principal Problem:  Delirium Active Problems:   Bipolar 1 disorder (HCC)   COPD (chronic obstructive pulmonary disease) (HCC)   Essential tremor   Migraine without aura   Parkinson's disease ([place])   Epilepsy ([place])   Depression with anxiety   HTN (hypertension)   Acute metabolic encephalopathy   UTI (urinary tract infection)   AKI (acute kidney injury) ([place])   Severe sepsis with septic shock (HCC)  Confusion [R41.0] Severe sepsis with septic shock (HCC) [A41.9, R65.21] Severe sepsis ([place]) [A41.9, R65.20] Altered mental status, unspecified altered mental status type [R41.82] Sepsis, due to unspecified organism, unspecified whether acute organ dysfunction present ([place]) [A41.9]  #. Acute kidney injury with hyperkalemia and metabolic acidosis: baseline creatinine 0.8 with normal GFR on 01/31/2020 - monitor volume status.   02/23 0701 - 02/24 0700 In: 103.6 [I.V.:3.6; IV Piggyback:100] Out: 1000    Lab Results  Component Value Date   CREATININE 11.79 (H) 09/05/2020   CREATININE 10.02 (H) 09/04/2020   CREATININE 9.14 (H) 09/03/2020   Creatinine continues to worsen Potassium currently 5.3 Dialysis attempted yesterday, but was discontinued due to clotting concerns. Able to remove 1L of fluid Dialysis scheduled today Ordered 2000 unit heparin bolus with dialysis UF goal 1L Requested patient sit in chair for dialysis to prepare for outpatient   # Anion gap metabolic acidosis: Salicylate  levels negative, acetaminophen levels negative.  Lactic acidosis has resolved  Most likely due to uremia/AKI    # Hypernatremia: secondary to free water deficit.  - Currently-144 -continues to remain stable   LOS: [address] 2/24/20222:06 [place], [place]

## 2020-09-06 NOTE — Progress Notes (Signed)
[place] at [place] NAME: Stacy Pearson    MR#:  [id number]  DATE OF BIRTH:  Jun 06, 1961  SUBJECTIVE:  patient more awake alert and very talkative today. Tells me about her dogs, how she adopted them and she misses them and wants to go home  Patient had low-grade temp of 99 yesterday. Started on IV antibiotic no fever today. Doing well. Tolerating PO diet.  REVIEW OF SYSTEMS:   Review of Systems  Unable to perform ROS: Mental status change   Tolerating Diet:pureed Tolerating PT: needs re-evaluation  DRUG ALLERGIES:   Allergies  Allergen Reactions  . Codeine Nausea And Vomiting  . Prednisone     VITALS:  Blood pressure (!) 180/105, pulse (!) 104, temperature 98.5 F (36.9 C), temperature source Oral, resp. rate 18, height 5\' 7"  (1.702 m), weight 67.1 kg, SpO2 96 %.  PHYSICAL EXAMINATION:   Physical Examlimited  GENERAL:  60 y.o.-year-old patient lying in the bed with no acute distress.  LUNGS: Normal breath sounds bilaterally, no wheezing, rales, rhonchi. No use of accessory muscles of respiration. permcath + CARDIOVASCULAR: S1, S2 normal. No murmurs, rubs, or gallops.  ABDOMEN: Soft, nontender, nondistended. Bowel sounds present. No organomegaly or mass.  EXTREMITIES: No cyanosis, clubbing or edema b/l.    NEUROLOGIC: no focal deficit moves all extremities spontaneously. PSYCHIATRIC:  patient is alert and awake. No focal deficit.  SKIN: No obvious rash, lesion, or ulcer.   LABORATORY PANEL:  CBC Recent Labs  Lab 09/05/20 0424  WBC 8.7  HGB 9.9*  HCT 29.5*  PLT 313    Chemistries  Recent Labs  Lab 09/01/20 0531 09/02/20 0438 09/03/20 2054 09/04/20 0509 09/05/20 0424  NA 139   < > 140   < > 144  K 3.9   < > 5.0   < > 5.3*  CL 97*   < > 100   < > 104  CO2 22   < > 18*   < > 20*  GLUCOSE 77   < > 85   < > 94  BUN 36*   < > 72*   < > 99*  CREATININE 4.49*   < > 9.14*   < > 11.79*  CALCIUM 8.9   < > 8.9   < >  8.6*  MG 2.2  --   --   --   --   AST  --   --  11*  --   --   ALT  --   --  <5  --   --   ALKPHOS  --   --  72  --   --   BILITOT  --   --  1.2  --   --    < > = values in this interval not displayed.   Cardiac Enzymes No results for input(s): TROPONINI in the last 168 hours. RADIOLOGY:  MR BRAIN WO CONTRAST  Result Date: 09/05/2020 CLINICAL DATA:  Parkinson's disease and epilepsy. Confusion. Agitation. Mental status changes. EXAM: MRI HEAD WITHOUT CONTRAST TECHNIQUE: Multiplanar, multiecho pulse sequences of the brain and surrounding structures were obtained without intravenous contrast. COMPARISON:  08/28/2020 FINDINGS: Brain: Diffusion imaging does not show any acute or subacute infarction. There is abnormal cortical and subcortical edema in a symmetric pattern affecting both hemispheres in the occipital, posterior temporal, parietal and posterior frontal regions virtually pathognomonic for posterior reversible encephalopathy. No evidence of hemorrhage. No mass effect or shift. Evidence of pre-existing  brain pathology. No hydrocephalus. No extra-axial collection. Vascular: Major vessels at the base of the brain show flow. Skull and upper cervical spine: Negative Sinuses/Orbits: Sinuses are clear. Orbits are negative. Small left mastoid effusion. Other: None IMPRESSION: Pattern of cortical and subcortical edema in both hemispheres virtually pathognomonic for posterior reversible encephalopathy. No evidence of hemorrhage or mass effect. Electronically Signed   By: Nelson Chimes M.D.   On: 09/05/2020 21:48   MR CERVICAL SPINE WO CONTRAST  Result Date: 09/05/2020 CLINICAL DATA:  Parkinson's disease. Mental status changes. Sepsis presentation. EXAM: MRI CERVICAL SPINE WITHOUT CONTRAST TECHNIQUE: Multiplanar, multisequence MR imaging of the cervical spine was performed. No intravenous contrast was administered. COMPARISON:  None. FINDINGS: Alignment: No traumatic malalignment. Minimal scoliotic  curvature. 1 mm degenerative anterolisthesis C7-T1. Vertebrae: Previous ACDF C5 through C7. Cord: No cord compression, primary cord lesion or myelomalacia Posterior Fossa, vertebral arteries, paraspinal tissues: Negative Disc levels: Foramen magnum is widely patent. Ordinary osteoarthritis at the C1-2 articulation but no encroachment upon the neural structures. C2-3: Mild facet osteoarthritis on the left.  No stenosis. C3-4: Mild bulging of the disc. Facet osteoarthritis on both sides. No canal stenosis. Mild right foraminal narrowing. C4-5: Endplate osteophytes and bulging of the disc. Facet osteoarthritis on the right. Mild canal narrowing but no compression of the cord. Right foraminal stenosis likely to compress the right C5 nerve. C5 through C7: Previous ACDF has a good appearance with wide patency of the canal and foramina. C7-T1: Facet osteoarthritis right worse than left with 1 mm of anterolisthesis. No compressive canal or foraminal narrowing. IMPRESSION: 1. Previous ACDF C5 through C7 has a good appearance. 2. Degenerative changes above and below the fusion. No compressive canal stenosis. Right foraminal stenosis primarily secondary to facet arthropathy at C4-5 likely to compress the right C5 nerve. Mild right foraminal narrowing at C3-4. Electronically Signed   By: Nelson Chimes M.D.   On: 09/05/2020 21:52   PERIPHERAL VASCULAR CATHETERIZATION  Result Date: 09/04/2020 See Op Note  EEG adult  Result Date: 09/05/2020 Lora Havens, MD     09/05/2020  3:37 PM Patient Name:Stacy Pearson ZOX:[id number] Epilepsy Attending:Priyanka Barbra Sarks Referring Physician/Provider:Dr Kerney Elbe Date:09/05/2020 Duration: 25 mins  Patient Stacy Pearson:82NF F with ams. EEG to evaluate for seizure  Level of alertness:Awake  AEDs during EEG study:LTG, LRZ  Technical aspects: This EEG study was done with scalp electrodes positioned according to the 10-20 International system of electrode placement.  Electrical activity was acquired at a sampling rate of 500Hz  and reviewed with a high frequency filter of 70Hz  and a low frequency filter of 1Hz . EEG data were recorded continuously and digitally stored.  Description:Noposterior dominant rhythm was seen.EEG showed continuous generalized 3 to 6 Hz theta-delta slowing.Photic driving was not seen during photic stimulation. Hyperventilation wasnot performed.   ABNORMALITY -Continuousslow, generalized  IMPRESSION: This study is suggestive of moderate diffuse encephalopathy, nonspecific etiology. No seizures orepileptiform discharges were seen throughout the recording.  Priyanka Barbra Sarks  ASSESSMENT AND PLAN:  Leonarda Leis [name] a 60 y.o.femalewith medical history significant ofbipolar, depression, anxiety, Parkinson, epilepsy, HTN, COPD, presents with AMS. On presentation patient was confused and agitated.  Admitting provider was able to contact her mother who is the only contact listed, and according to her she lives alone and was at her normal state of health until Friday afternoon. She was sleeping most of the time since then and found to be more confused and somnolent and EMS was called.  Acute Metabolic encephalopathy   --  Present on admission suspected due to polypharmacy and possible overdose --Significant improvement in her mental status  ---2/22--Patient had a T-max of 100.9 this a.m. with tachycardia concerning for development of infection. Empirically on vancomycin  --2/23--Patient is more awake today. Tells me her name. Says hello, good morning and repeats a few words. Tells me she is hungry --Antibiotics discontinued per ID as urine, blood and CSF culture remain negative --2/24--very talkative and appears near baseline. Had temp 99 last pm.BC from 2/21--negative . On IV cefepime--d/w pharmacy to see if it can be d/ced   AKI -dialysis per nephrology.   --s/- Permacath  per vascular surgery and on  Dialysis --pt will need to  sit in chair today for HD.  --needs out pt chair time. HD coordinator aware  Acute hypoxic respiratory failure Now resolved --pt on RA  Hyperkalemia.   k 5.3--should improve with HD  Bipolar 1 disorder with depression and anxiety.  --2/21--Psychiatry signed off.  --2/24--dr clapacs re-consulted to review psych meds  COPD.  Appears stable, no wheezing and chest x-ray without any acute abnormality. -As needed bronchodilators  History of Parkinson's disease.   -2/21--Neurology changing Sinemet 1 tablet p.o. 3 times daily  -- Patient is more awake today and talkative --Continue with propranolol-do not know whether tremors are essential or secondary to underlying Parkinson's.  History of seizure disorder  Seizure ruled out, EEG negative for any epileptiform waves On  Lamictal  Hypertension.  Blood pressure running high On home propranolol. -Hydralazine 50 mg BID. I'll add amlodipine.  Generalized weakness PT, OT recommends CIR.  TOC aware.  Since patient is mentally improved so much will try PT reevaluation again. Patient is motivated and wants to go home.   Palliative care consulted.  Procedures: Family communication :mother Adonis Huguenin on the phone Consults :ID nephrololgy, psychiatry CODE STATUS: DNR DVT Prophylaxis : heparin Level of care: Med-Surg Status is: Inpatient  Patient remains in house since  started on dialysis. She will need outpatient dialysis chair time. Patient still needs to work with physical therapy to determine if she is able to go home.      TOTAL TIME TAKING CARE OF THIS PATIENT: 25 minutes.  >50% time spent on counselling and coordination of care  Note: This dictation was prepared with Dragon dictation along with smaller phrase technology. Any transcriptional errors that result from this process are unintentional.  Fritzi Mandes M.D    [facility]   CC: Primary care physician; Valera Castle, MDPatient ID: [name], female   DOB: 1961-05-08, 60 y.o.   MRN: [id number]

## 2020-09-06 NOTE — Consult Note (Signed)
Consultation Note Date: 09/06/2020   Patient Name: Stacy Pearson  DOB: May 15, 1961  MRN: [id number]  Age / Sex: 60 y.o., female  PCP: Valera Castle, MD Referring Physician: Fritzi Mandes, MD  Reason for Consultation: Establishing goals of care  HPI/Patient Profile: CANDIE GINTZ is a 60 y.o. female with medical history significant of bipolar, depression, anxiety, Parkinson, epilepsy, HTN, COPD, presents with AMS.  Clinical Assessment and Goals of Care: Patient is sitting in bedside chair. She states she lives at home [place] mother [place]. She is divorced.   We discussed her diagnosis, prognosis, GOC, EOL wishes disposition and options.  Created space and opportunity for patient  to explore thoughts and feelings regarding current medical information.   A detailed discussion was had today regarding advanced directives.  Concepts specific to code status, artifical feeding and hydration, IV antibiotics and rehospitalization were discussed.  The difference between an aggressive medical intervention path and a comfort care path was discussed.  Values and goals of care important to patient and family were attempted to be elicited.  Discussed limitations of medical interventions to prolong quality of life in some situations and discussed the concept of human mortality.  She confirms DNR/DNI. She would never want a feeding tube. She states she is willing to try dialysis to see how she feels. QOL is very important to her and she would never want to live in a facility. Discussed her Parkinsons. Discussed being followed for optimal medication management and DBS for her symptoms.     SUMMARY OF RECOMMENDATIONS   Recommend palliative at D/C.       Primary Diagnoses: Present on Admission: . Bipolar 1 disorder ([place]) . COPD (chronic obstructive pulmonary disease) ([place]) . Essential  tremor . Migraine without aura . Depression with anxiety . HTN (hypertension) . Parkinson's disease ([place]) . Acute metabolic encephalopathy . UTI (urinary tract infection) . AKI (acute kidney injury) ([place]) . Severe sepsis with septic shock ([place])   I have reviewed the medical record, interviewed the patient and family, and examined the patient. The following aspects are pertinent.  Past Medical History:  Diagnosis Date  . Abdominal pain   . Allergic genetic state   . Anxiety   . Asthma   . Basal cell carcinoma   . Bipolar disorder ([place])   . Cancer ([place])    MOS surgery basal cell face  . COPD (chronic obstructive pulmonary disease) ([place])   . Depression   . Eating disorder   . Eczema   . Encounter for blood transfusion   . Essential tremor   . 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 GERD (gastroesophageal reflux disease)   . Headache   . History of basal cell carcinoma   . Hypertension   . Neuropathy   . Parkinson's disease ([place])   . Parkinson's disease ([place])   . Parkinsonism [facility])    Social History   Socioeconomic History  . Marital status: Single    Spouse  name: Not on file  . Number of children: Not on file  . Years of education: Not on file  . Highest education level: Not on file  Occupational History  . Not on file  Tobacco Use  . Smoking status: Former Smoker    Types: Cigarettes    Quit date: 07/14/2006    Years since quitting: 14.1  . Smokeless tobacco: Never Used  Substance and Sexual Activity  . Alcohol use: Never  . Drug use: Never  . Sexual activity: Not on file  Other Topics Concern  . Not on file  Social History Narrative  . Not on file   Social Determinants of Health   Financial Resource Strain: Not on file  Food Insecurity: Not on file  Transportation Needs: Not on file  Physical Activity: Not on file  Stress: Not on file  Social Connections: Not on file   Family History  Problem Relation Age of Onset   . Breast cancer Mother   . Lung cancer Father    Scheduled Meds: . amLODipine  10 mg Oral Daily  . carbidopa-levodopa  1 tablet Oral TID  . Chlorhexidine Gluconate Cloth  6 each Topical Q0600  . vitamin B-12  1,000 mcg Oral Daily   Or  . cyanocobalamin  1,000 mcg Intramuscular Daily  . feeding supplement (NEPRO CARB STEADY)  237 mL Oral BID BM  . heparin injection (subcutaneous)  5,000 Units Subcutaneous Q8H  . heparin sodium (porcine)  2,000 Units Intravenous Once  . hydrALAZINE  50 mg Oral BID  . Ipratropium-Albuterol  1 puff Inhalation Q6H  . lamoTRIgine  25 mg Oral BID  . multivitamin  1 tablet Oral QHS  . propranolol ER  60 mg Oral Daily   Continuous Infusions: . sodium chloride Stopped (09/05/20 2309)  . ceFEPime (MAXIPIME) IV Stopped (09/05/20 2250)   PRN Meds:.sodium chloride, acetaminophen **OR** acetaminophen, albuterol, hydrALAZINE, ondansetron (ZOFRAN) IV Medications Prior to Admission:  Prior to Admission medications   Medication Sig Start Date End Date Taking? Authorizing Provider  Carbidopa-Levodopa ER (SINEMET CR) 25-100 MG tablet controlled release Take 1-2 tablets by mouth 4 (four) times daily. Take one tablet by mouth at 7 am, one tablet at 12 pm, one tablet between 5-6 pm and two tablets at bedtime   Yes [provider]  amitriptyline (ELAVIL) 50 MG tablet Take 1-2 tablets by mouth at bedtime. 03/10/20   [provider]  carbidopa-levodopa (SINEMET IR) 25-100 MG tablet Take 1-2 tablets by mouth 3 (three) times daily. Take two tablets by mouth at 7 am, two tablets at 12 pm and one tablet at 5 pm    [provider]  Cariprazine HCl 6 MG CAPS Take 6 mg by mouth daily at 12 noon. 03/30/20   [provider]  diazepam (VALIUM) 10 MG tablet Take 10 mg by mouth 3 (three) times daily as needed. 08/22/20   [provider]  DULoxetine (CYMBALTA) 60 MG capsule Take by mouth. 05/03/19   [provider]  EPINEPHrine 0.3 mg/0.3  mL IJ SOAJ injection Inject into the muscle once.    [provider]  Ipratropium-Albuterol (COMBIVENT) 20-100 MCG/ACT AERS respimat Inhale 1 puff into the lungs every 6 (six) hours.    [provider]  lamoTRIgine (LAMICTAL) 25 MG tablet Take 50 mg by mouth at bedtime. 08/14/20   [provider]  ondansetron (ZOFRAN) 8 MG tablet Take by mouth every 8 (eight) hours as needed for nausea or vomiting.  [provider]  propranolol ER (INDERAL LA) 60 MG 24 hr capsule Take 60 mg by mouth daily. 08/14/20   [provider]   Allergies  Allergen Reactions  . Codeine Nausea And Vomiting  . Prednisone    Review of Systems  Neurological:       Bilateral hand weakness.     Physical Exam Pulmonary:     Effort: Pulmonary effort is normal.  Neurological:     Mental Status: She is alert.     Vital Signs: BP (!) 180/105 (BP Location: Left Arm)   Pulse (!) 104   Temp 98.5 F (36.9 C) (Oral)   Resp 18   Ht 5\' 7"  (1.702 m)   Wt 67.1 kg   SpO2 96%   BMI 23.17 kg/m  Pain Scale: PAINAD   Pain Score: 0-No pain   SpO2: SpO2: 96 % O2 Device:SpO2: 96 % O2 Flow Rate: .O2 Flow Rate (L/min): 2 L/min  IO: Intake/output summary:   Intake/Output Summary (Last 24 hours) at 09/06/2020 1355 Last data filed at 09/06/2020 0900 Gross per 24 hour  Intake 143.6 ml  Output 1000 ml  Net -856.4 ml    LBM: Last BM Date: 09/03/20 Baseline Weight: Weight: 61.6 kg Most recent weight: Weight: 67.1 kg       Time In: 1:30 Time Out: 2:00 Time Total: 30 min Greater than 50%  of this time was spent counseling and coordinating care related to the above assessment and plan.  Signed by: Asencion Gowda, NP   Please contact Palliative Medicine Team phone at [phone] for questions and concerns.  For individual provider: See Shea Evans

## 2020-09-07 DIAGNOSIS — J449 Chronic obstructive pulmonary disease, unspecified: Secondary | ICD-10-CM | POA: Diagnosis not present

## 2020-09-07 DIAGNOSIS — N179 Acute kidney failure, unspecified: Secondary | ICD-10-CM | POA: Diagnosis not present

## 2020-09-07 DIAGNOSIS — G9341 Metabolic encephalopathy: Secondary | ICD-10-CM | POA: Diagnosis not present

## 2020-09-07 DIAGNOSIS — F319 Bipolar disorder, unspecified: Secondary | ICD-10-CM | POA: Diagnosis not present

## 2020-09-07 LAB — VITAMIN B1: Vitamin B1 (Thiamine): 133 nmol/L (ref 66.5–200.0)

## 2020-09-07 MED ORDER — EPOETIN ALFA 10000 UNIT/ML IJ SOLN
4000.0000 [IU] | INTRAMUSCULAR | Status: DC
  Administered 2020-09-08: 4000 [IU] via INTRAVENOUS

## 2020-09-07 MED ORDER — LAMOTRIGINE 25 MG PO TABS: 25.0000 mg | ORAL_TABLET | Freq: Every day | ORAL | Status: DC

## 2020-09-07 MED ORDER — DULOXETINE HCL 30 MG PO CPEP
60.0000 mg | ORAL_CAPSULE | Freq: Every day | ORAL | Status: DC
Start: 2020-09-08 — End: 2020-09-11
  Administered 2020-09-08 – 2020-09-10 (×3): 60 mg via ORAL
  Filled 2020-09-07 (×3): qty 2

## 2020-09-07 NOTE — NC FL2 (Signed)
[place] LEVEL OF CARE SCREENING TOOL     IDENTIFICATION  Patient Name: Stacy Pearson Birthdate: [birth year] Sex: female Admission Date (Current Location): 08/26/2020  [facility] and [place] Number:  Engineering geologist and Address:  [facility], [address], [place], [place] [address]      Provider Number: [id number]  Attending Physician Name and Address:  Fritzi Mandes, MD  Relative Name and Phone Number:       Current Level of Care: Hospital Recommended Level of Care: [place] Prior Approval Number:    Date Approved/Denied:   PASRR Number: Manual review  Discharge Plan: SNF    Current Diagnoses: Patient Active Problem List   Diagnosis Date Noted  . Epilepsy ([place]) 08/26/2020  . Depression with anxiety 08/26/2020  . HTN (hypertension) 08/26/2020  . Acute metabolic encephalopathy 37/90/2409  . UTI (urinary tract infection) 08/26/2020  . AKI (acute kidney injury) ([place]) 08/26/2020  . Severe sepsis with septic shock ([place]) 08/26/2020  . Delirium 08/26/2020  . Acute diarrhea 03/08/2018  . Status post cervical spinal fusion 12/31/2017  . Cervical spondylosis with radiculopathy 10/28/2017  . Colon polyp 10/08/2017  . Essential tremor 10/08/2017  . Cervical radiculopathy 09/03/2017  . Allergic rhinitis 12/30/2016  . Sting of hornets, wasps, and bees causing poisoning and toxic reactions 12/30/2016  . Allergic rhinitis due to dogs 12/17/2016  . Asthma, currently active 12/17/2016  . Muscle cramps 11/18/2016  . Sleep disorder 08/25/2016  . At risk for falls 08/19/2016  . Elevated vitamin B12 level 08/19/2016  . History of fall within past 90 days 08/19/2016  . Nausea and vomiting 08/19/2016  . Parkinson's disease ([name]) 08/19/2016  . Diaphoresis 03/25/2016  . Dry scalp 03/25/2016  . Dry skin 03/25/2016  . Back pain 07/04/2015  . Tremor 01/02/2014  . Asthma 07/28/2013  . COPD (chronic obstructive pulmonary  disease) ([place]) 07/28/2013  . Ulnar neuropathy of left upper extremity 06/06/2013  . Meibomian gland dysfunction 11/09/2012  . Raynaud phenomenon 05/03/2012  . Anorexia nervosa with bulimia 01/17/2011  . Basal cell carcinoma of other specified sites of skin 01/17/2011  . Dyspepsia and other specified disorders of function of stomach 01/17/2011  . Generalized anxiety disorder 01/17/2011  . Generalized tonic clonic epilepsy ([place]) 01/17/2011  . Migraine without aura 01/17/2011  . Other atopic dermatitis and related conditions 01/17/2011  . Bipolar 1 disorder ([place]) 04/28/1995    Orientation RESPIRATION BLADDER Height & Weight     Self,Place  Normal Incontinent,External catheter Weight: 147 lb 14.9 oz (67.1 kg) Height:  5\' 7"  (170.2 cm)  BEHAVIORAL SYMPTOMS/MOOD NEUROLOGICAL BOWEL NUTRITION STATUS   (None)  (Epilepsy, Parkinsons) Incontinent Diet (Regular. Fluid restriction 1200 mL. Low potassium.)  AMBULATORY STATUS COMMUNICATION OF NEEDS Skin   Limited Assist Verbally Skin abrasions                       Personal Care Assistance Level of Assistance  Bathing,Feeding,Dressing Bathing Assistance: Maximum assistance Feeding assistance: Maximum assistance Dressing Assistance: Maximum assistance     Functional Limitations Info  Sight,Hearing,Speech Sight Info: Adequate Hearing Info: Adequate Speech Info: Adequate    SPECIAL CARE FACTORS FREQUENCY  PT (By licensed PT),OT (By licensed OT)     PT Frequency: 5 x week OT Frequency: 5 x week            Contractures Contractures Info: Not present    Additional Factors Info  Code Status,Allergies,Psychotropic Code Status Info: DNR  Allergies Info: Codeine, Prednisone Psychotropic Info: Bipolar, Anxiety         Current Medications (09/07/2020):  This is the current hospital active medication list Current Facility-Administered Medications  Medication Dose Route Frequency Provider Last Rate Last Admin  . 0.9 %  sodium  chloride infusion   Intravenous PRN Schnier, Dolores Lory, MD   Stopped at 09/05/20 2309  . acetaminophen (TYLENOL) tablet 650 mg  650 mg Oral Q6H PRN Schnier, Dolores Lory, MD       Or  . acetaminophen (TYLENOL) suppository 650 mg  650 mg Rectal Q6H PRN Schnier, Dolores Lory, MD   650 mg at 09/04/20 0825  . albuterol (PROVENTIL) (2.5 MG/3ML) 0.083% nebulizer solution 2.5 mg  2.5 mg Nebulization Q4H PRN Schnier, Dolores Lory, MD   2.5 mg at 09/02/20 2351  . amLODipine (NORVASC) tablet 10 mg  10 mg Oral Daily Fritzi Mandes, MD      . carbidopa-levodopa (SINEMET IR) 25-100 MG per tablet immediate release 1 tablet  1 tablet Oral TID Delana Meyer Dolores Lory, MD   1 tablet at 09/06/20 2051  . Chlorhexidine Gluconate Cloth 2 % PADS 6 each  6 each Topical Q0600 Schnier, Dolores Lory, MD   6 each at 09/07/20 [phone]  . vitamin B-12 (CYANOCOBALAMIN) tablet 1,000 mcg  1,000 mcg Oral Daily Schnier, Dolores Lory, MD   1,000 mcg at 09/06/20 1225   Or  . cyanocobalamin ((VITAMIN B-12)) injection 1,000 mcg  1,000 mcg Intramuscular Daily Schnier, Dolores Lory, MD   1,000 mcg at 09/04/20 0941  . feeding supplement (NEPRO CARB STEADY) liquid 237 mL  237 mL Oral BID BM Fritzi Mandes, MD   237 mL at 09/06/20 1408  . heparin injection 5,000 Units  5,000 Units Subcutaneous Q8H Schnier, Dolores Lory, MD   5,000 Units at 09/07/20 0424  . heparin sodium (porcine) injection 2,000 Units  2,000 Units Intravenous Once Colon Flattery, NP      . hydrALAZINE (APRESOLINE) injection 10 mg  10 mg Intravenous Q6H PRN Schnier, Dolores Lory, MD   10 mg at 09/06/20 0973  . hydrALAZINE (APRESOLINE) tablet 50 mg  50 mg Oral BID Fritzi Mandes, MD      . Ipratropium-Albuterol (COMBIVENT) respimat 1 puff  1 puff Inhalation Q6H Schnier, Dolores Lory, MD   1 puff at 09/07/20 [phone]  . lamoTRIgine (LAMICTAL) tablet 25 mg  25 mg Oral BID Delana Meyer Dolores Lory, MD   25 mg at 09/06/20 2050  . multivitamin (Breona Cherubin-VIT) tablet 1 tablet  1 tablet Oral QHS Fritzi Mandes, MD   1 tablet at 09/06/20  2050  . ondansetron (ZOFRAN) injection 4 mg  4 mg Intravenous Q8H PRN Schnier, Dolores Lory, MD   4 mg at 09/06/20 1917  . propranolol ER (INDERAL LA) 24 hr capsule 60 mg  60 mg Oral Daily Schnier, Dolores Lory, MD   60 mg at 09/06/20 1233     Discharge Medications: Please see discharge summary for a list of discharge medications.  Relevant Imaging Results:  Relevant Lab Results:   Additional Information SS#: [id number]. New HD  Candie Chroman, LCSW

## 2020-09-07 NOTE — Care Management (Signed)
RE: Stacy Pearson Date of Birth: December 26, 2060 Date: 09/07/20   To Whom It May Concern:  Please be advised that the above-named patient will require a short-term nursing home stay - anticipated 30 days or less for rehabilitation and strengthening.  The plan is for return home.

## 2020-09-07 NOTE — Progress Notes (Addendum)
Speech Language Pathology Treatment: Dysphagia  Patient Details Name: Stacy Pearson MRN: [id number] DOB: Nov 15, 1960 Today's Date: 09/07/2020 Time: 2330-0762 SLP Time Calculation (min) (ACUTE ONLY): 40 min  Assessment / Plan / Recommendation Clinical Impression  Ptseen today for ongoing assessment of toleration of recently upgraded diet consistency to Regular w/ thin liquids. Pt has her Dentures now brought in yesterday by Mother. Pt continues to exhibit improved engagement each day and currently presents w/ improved Cognitive status and verbal engagement w/ much more timely verbal responses w/ more detail -- she continues to have delay in processing and word-finding. Psychiatry reported pt continues to be Encephalopathic which will impact cognitive-communication w/ others. Min verbal/visual cues given during session/po trials. Pt requires full support w/ feeding d/t UE weakness, hands weak w/ poor grip strength. Dentures placed by SLP.  This morning, pt was lying sideways in the bed disheveled and required FULL guidance and support to position upright in bed for oral intake. Poor attention and awareness to self-situation; did not independently initiate movements to self-correct positioning needs.  She was assisted w/ feeding d/t UE weakness and inability to self-feed. She consumed po trials of solids (nuts) and fruit cocktail and yogurt w/ thin liquids via Straw. No immediate,overt clinical s/s of aspiration noted w/ any consistency; no decline in phonationswhen requested, no cough, and no decline in respiratory status during/post trials.Oral phasewas grossly WFL for bolus management and mastication of solids though oral phase time w/ solids was min increased for mastication/clearing; timely A-P transit time w/ thin liquids and purees noted. Slight diffuse residue noted orally post trials of foods -- pt was instructed/practiced Lingual Sweeping to orally clear b/t bites. Also alternated bites  and sips to aid oral clearing. Min verbal Cues andTime given to ensure oral clearing via lingual sweeping. Pt was able to attend and follow through w/ general precautions during po trials this session. Suspect impact from Encephalopathy as per Psychiatry.  Pt appears to be a reduced risk for aspirationwhen following general aspiration precautions, given support/cues during po's, and full feeding support/Supervision (d/t her UE weakness currently). She requires min verbal cueing for full attention during po tasks; safety awareness.    Recommend continue a Regular consistency diet (Renal per MD) w/Thinliquids; discussion of foods of preference d/t "picky eating" per Mother w/ Dietician; general aspiration precautions; reduce Distractions during meals. Pills Whole in Puree for safer swallowing. Support w/ feeding at meals -- encourage Lingual Sweeping and check for oral clearing during/post intake as needed. Team updated.   Addendum: Pt would benefit from formal Cognitive-linguistic assessment and f/u at Discharge when able to be in a more structured setting for addressing Safety Awareness and problem-solving w/ ADLS. Of note, pt has not been taking Meds for Bipolar Dis prior to admit per chart notes; Meds for Parkinson's Dis. have just recently been adjusted and scheduled to better meet her needs this admit; pt continues to have Encephalopathy per Psychiatry notes as well as NSG report that she has Baseline Cognitive-developmental delay -- these issues could impact Cognitive function especially during an Acute illness/episode. Noted she was followed by Palliative Care services in the home in Jan. 2022.Precautions posted in room.       HPI HPI: Pt is a 60 y.o. female with history of Parkinson's Dis., Bipolar Disorder, COPD, admitted with somnolence, confusion, agitation and lethargy. Found to have sepsis with unclear/unknown source. MRI 2/17 showed no acute findings, CXR negative for infiltrate.  Resides at home w/ Mother assisting in some  ADLs. Followed by Palliative Care services in the Home per chart. Psychiatry and Neurology currently following adjusting medication for Parkinson's Disease.      SLP Plan  All goals met       Recommendations  Diet recommendations: Regular;Thin liquid (pt likes to eat Nuts per pt/Mom) Liquids provided via: Cup;Straw (monitor) Medication Administration: Whole meds with puree (as able vs crushed) Supervision: Patient able to self feed;Staff to assist with self feeding;Full supervision/cueing for compensatory strategies (UEs weak) Compensations: Minimize environmental distractions;Slow rate;Small sips/bites;Lingual sweep for clearance of pocketing;Multiple dry swallows after each bite/sip;Follow solids with liquid Postural Changes and/or Swallow Maneuvers: Out of bed for meals;Seated upright 90 degrees;Upright 30-60 min after meal                General recommendations:  (Dietician f/u; Cognitive eval/tx) Oral Care Recommendations: Oral care BID;Oral care before and after PO;Staff/trained caregiver to provide oral care (Dentures) Follow up Recommendations: Inpatient Rehab;Skilled Nursing facility (TBD) SLP Visit Diagnosis: Dysphagia, unspecified (R13.10) (decreased attention overall) Plan: All goals met       GO                Sanela Evola 09/07/2020, 10:58 AM

## 2020-09-07 NOTE — Progress Notes (Signed)
[facility] NAME: Stacy Pearson    MR#:  [id number]  DATE OF BIRTH:  1961/04/12  SUBJECTIVE:  patient continues to remain alert. Tolerating regular diet. Seen by speech yesterday. Worked with physical therapy. REVIEW OF SYSTEMS:   Review of Systems  Constitutional: Negative for chills, fever and weight loss.  HENT: Negative for ear discharge, ear pain and nosebleeds.   Eyes: Negative for blurred vision, pain and discharge.  Respiratory: Negative for sputum production, shortness of breath, wheezing and stridor.   Cardiovascular: Negative for chest pain, palpitations, orthopnea and PND.  Gastrointestinal: Negative for abdominal pain, diarrhea, nausea and vomiting.  Genitourinary: Negative for frequency and urgency.  Musculoskeletal: Negative for back pain and joint pain.  Neurological: Positive for weakness. Negative for sensory change, speech change and focal weakness.  Psychiatric/Behavioral: Negative for depression and hallucinations. The patient is not nervous/anxious.    Tolerating Diet:yes Tolerating PT: CIR  DRUG ALLERGIES:   Allergies  Allergen Reactions  . Codeine Nausea And Vomiting  . Prednisone     VITALS:  Blood pressure (!) 144/70, pulse 89, temperature 99 F (37.2 C), temperature source Oral, resp. rate 16, height 5\' 7"  (1.702 m), weight 67.1 kg, SpO2 96 %.  PHYSICAL EXAMINATION:   Physical Examlimited  GENERAL:  60 y.o.-year-old patient lying in the bed with no acute distress.  LUNGS: Normal breath sounds bilaterally, no wheezing, rales, rhonchi. No use of accessory muscles of respiration. permcath + CARDIOVASCULAR: S1, S2 normal. No murmurs, rubs, or gallops.  ABDOMEN: Soft, nontender, nondistended. Bowel sounds present. No organomegaly or mass.  EXTREMITIES: No cyanosis, clubbing or edema b/l.    NEUROLOGIC: no focal deficit moves all extremities spontaneously. PSYCHIATRIC:  patient is alert and awake.  No focal deficit.  SKIN: No obvious rash, lesion, or ulcer.   LABORATORY PANEL:  CBC Recent Labs  Lab 09/06/20 1522  WBC 9.5  HGB 9.2*  HCT 28.1*  PLT 335    Chemistries  Recent Labs  Lab 09/01/20 0531 09/02/20 0438 09/03/20 2054 09/04/20 0509 09/06/20 1522  NA 139   < > 140   < > 138  K 3.9   < > 5.0   < > 3.9  CL 97*   < > 100   < > 98  CO2 22   < > 18*   < > 27  GLUCOSE 77   < > 85   < > 127*  BUN 36*   < > 72*   < > 58*  CREATININE 4.49*   < > 9.14*   < > 6.83*  CALCIUM 8.9   < > 8.9   < > 8.6*  MG 2.2  --   --   --   --   AST  --   --  11*  --   --   ALT  --   --  <5  --   --   ALKPHOS  --   --  72  --   --   BILITOT  --   --  1.2  --   --    < > = values in this interval not displayed.   Cardiac Enzymes No results for input(s): TROPONINI in the last 168 hours. RADIOLOGY:  MR BRAIN WO CONTRAST  Result Date: 09/05/2020 CLINICAL DATA:  Parkinson's disease and epilepsy. Confusion. Agitation. Mental status changes. EXAM: MRI HEAD WITHOUT CONTRAST TECHNIQUE: Multiplanar, multiecho pulse sequences of the brain and  surrounding structures were obtained without intravenous contrast. COMPARISON:  08/28/2020 FINDINGS: Brain: Diffusion imaging does not show any acute or subacute infarction. There is abnormal cortical and subcortical edema in a symmetric pattern affecting both hemispheres in the occipital, posterior temporal, parietal and posterior frontal regions virtually pathognomonic for posterior reversible encephalopathy. No evidence of hemorrhage. No mass effect or shift. Evidence of pre-existing brain pathology. No hydrocephalus. No extra-axial collection. Vascular: Major vessels at the base of the brain show flow. Skull and upper cervical spine: Negative Sinuses/Orbits: Sinuses are clear. Orbits are negative. Small left mastoid effusion. Other: None IMPRESSION: Pattern of cortical and subcortical edema in both hemispheres virtually pathognomonic for posterior reversible  encephalopathy. No evidence of hemorrhage or mass effect. Electronically Signed   By: Nelson Chimes M.D.   On: 09/05/2020 21:48   MR CERVICAL SPINE WO CONTRAST  Result Date: 09/05/2020 CLINICAL DATA:  Parkinson's disease. Mental status changes. Sepsis presentation. EXAM: MRI CERVICAL SPINE WITHOUT CONTRAST TECHNIQUE: Multiplanar, multisequence MR imaging of the cervical spine was performed. No intravenous contrast was administered. COMPARISON:  None. FINDINGS: Alignment: No traumatic malalignment. Minimal scoliotic curvature. 1 mm degenerative anterolisthesis C7-T1. Vertebrae: Previous ACDF C5 through C7. Cord: No cord compression, primary cord lesion or myelomalacia Posterior Fossa, vertebral arteries, paraspinal tissues: Negative Disc levels: Foramen magnum is widely patent. Ordinary osteoarthritis at the C1-2 articulation but no encroachment upon the neural structures. C2-3: Mild facet osteoarthritis on the left.  No stenosis. C3-4: Mild bulging of the disc. Facet osteoarthritis on both sides. No canal stenosis. Mild right foraminal narrowing. C4-5: Endplate osteophytes and bulging of the disc. Facet osteoarthritis on the right. Mild canal narrowing but no compression of the cord. Right foraminal stenosis likely to compress the right C5 nerve. C5 through C7: Previous ACDF has a good appearance with wide patency of the canal and foramina. C7-T1: Facet osteoarthritis right worse than left with 1 mm of anterolisthesis. No compressive canal or foraminal narrowing. IMPRESSION: 1. Previous ACDF C5 through C7 has a good appearance. 2. Degenerative changes above and below the fusion. No compressive canal stenosis. Right foraminal stenosis primarily secondary to facet arthropathy at C4-5 likely to compress the right C5 nerve. Mild right foraminal narrowing at C3-4. Electronically Signed   By: Nelson Chimes M.D.   On: 09/05/2020 21:52   EEG adult  Result Date: 09/05/2020 Lora Havens, MD     09/05/2020  3:37 PM  Patient Name:Stacy Pearson OZY:[id number] Epilepsy Attending:Priyanka Barbra Sarks Referring Physician/Provider:Dr Kerney Elbe Date:09/05/2020 Duration: 25 mins  Patient history:60yo F with ams. EEG to evaluate for seizure  Level of alertness:Awake  AEDs during EEG study:LTG, LRZ  Technical aspects: This EEG study was done with scalp electrodes positioned according to the 10-20 International system of electrode placement. Electrical activity was acquired at a sampling rate of 500Hz  and reviewed with a high frequency filter of 70Hz  and a low frequency filter of 1Hz . EEG data were recorded continuously and digitally stored.  Description:Noposterior dominant rhythm was seen.EEG showed continuous generalized 3 to 6 Hz theta-delta slowing.Photic driving was not seen during photic stimulation. Hyperventilation wasnot performed.   ABNORMALITY -Continuousslow, generalized  IMPRESSION: This study is suggestive of moderate diffuse encephalopathy, nonspecific etiology. No seizures orepileptiform discharges were seen throughout the recording.  Priyanka Barbra Sarks  ASSESSMENT AND PLAN:  Stacy Morissette [name] a 60 y.o.femalewith medical history significant ofbipolar, depression, anxiety, Parkinson, epilepsy, HTN, COPD, presents with AMS. On presentation patient was confused and agitated.  Admitting provider was able  to contact her mother who is the only contact listed, and according to her she lives alone and was at her normal state of health until Friday afternoon. She was sleeping most of the time since then and found to be more confused and somnolent and EMS was called.  Acute Metabolic encephalopathy   --Present on admission suspected due to polypharmacy and possible overdose --Significant improvement in her mental status  ---2/22--Patient had a T-max of 100.9 this a.m. with tachycardia concerning for development of infection. Empirically on vancomycin  --2/23--Patient is more awake  today. Tells me her name. Says hello, good morning and repeats a few words. Tells me she is hungry --Antibiotics discontinued per ID as urine, blood and CSF culture remain negative --2/24--very talkative and appears near baseline. Had temp 99 last pm.BC from 2/21--negative . On IV cefepime--d/w pharmacy to see if it can be d/ced  --2/25-- remains afebrile. Not on any antibiotic. Continues to improve with swallowing now on regular diet. Worked with physical therapy and occupational therapy. CIR staff to come evaluate today.  AKI -dialysis per nephrology.   --s/- Permacath  per vascular surgery and on  Dialysis --pt will need to sit in chair today for HD.  --needs out pt chair time. HD coordinator aware  Acute hypoxic respiratory failure Now resolved --pt on RA  Hyperkalemia.   k 5.3--should improve with HD  Bipolar 1 disorder with depression and anxiety.  --2/21--Psychiatry signed off.  --2/24--dr clapacs re-consulted to review psych meds --2/25-- per Dr. Weber Cooks continue to monitor. Holding psych meds for now.  COPD.  Appears stable, no wheezing and chest x-ray without any acute abnormality. -As needed bronchodilators  History of Parkinson's disease.   -2/21--Neurology changing Sinemet 1 tablet p.o. 3 times daily  -- Patient is more awake today and talkative --Continue with propranolol  Hypertension.  Blood pressure running high On home propranolol. -Hydralazine 50 mg BID. I'll add amlodipine.  Generalized weakness PT, OT recommends CIR.  TOC aware.     Palliative care consulted.  Procedures: Family communication :mother Adonis Huguenin on the phone 2/24 Consults :ID nephrololgy, psychiatry CODE STATUS: DNR DVT Prophylaxis : heparin Level of care: Med-Surg Status is: Inpatient  Patient remains in house since  started on dialysis. She will need outpatient dialysis chair time. Discharge plan is likely to CIR once evaluated by staff.      TOTAL TIME TAKING CARE OF  THIS PATIENT: 25 minutes.  >50% time spent on counselling and coordination of care  Note: This dictation was prepared with Dragon dictation along with smaller phrase technology. Any transcriptional errors that result from this process are unintentional.  Fritzi Mandes M.D    [facility]   CC: Primary care physician; Valera Castle, MDPatient ID: Stacy Pearson, female   DOB: [birth year], 61 y.o.   MRN: [id number]

## 2020-09-07 NOTE — Progress Notes (Signed)
Physical Therapy Treatment Patient Details Name: Stacy Pearson MRN: [id number] DOB: 01/17/1961 Today's Date: 09/07/2020    History of Present Illness Pt admitted to 08/26/20 for OD (cariprazine and alprazolam) vs. UTI with sepsis. On presentation, pt was found to have neutrophilic predominant leukocytosis, lactic acidosis at 6.7, AKI, tachycardic, and tachypneic. CXR with low lung volumes and no infiltrate; HCT negative. RR called on 2/16. PMH significant for: bipolar, depression, anxiety, PD, epilepsy, HTN, and COPD.    PT Comments    Patient alert, in bed agreeable to therapy. Pt with great motivation today, and session focused on NMR interventions to address pt deficits. Pt was assisted with drinking fluids, pt unable to perform without maxA.  Pt was able to ambulate >320ft throughout session with minA-modA for many LOBs. Patient performed dynamic balance activities while ambulating including lateral stepping, gait velocity changes, backwards ambulating, and emphasis on step length/stride length. Pt needed cueing to re-direct to task as well as for technique/safety. Pt also performed static standing balance interventions with feet together with eyes closed, head nods, etc with minA. Returned to room and up in chair with all needs in reach. The patient would benefit from further skilled PT intervention to continue to progress towards goals. Recommendation remains appropriate to maximize pt safety, mobility, and independence.      Follow Up Recommendations  CIR;Supervision/Assistance - 24 hour;Supervision for mobility/OOB     Equipment Recommendations  Other (comment) (TBD at next level of care)    Recommendations for Other Services Rehab consult     Precautions / Restrictions Precautions Precautions: Fall Precaution Comments: DNR Restrictions Weight Bearing Restrictions: No    Mobility  Bed Mobility Overal bed mobility: Needs Assistance Bed Mobility: Supine to Sit     Supine  to sit: Min guard;HOB elevated          Transfers Overall transfer level: Needs assistance Equipment used: None Transfers: Sit to/from Stand Sit to Stand: Min assist Stand pivot transfers: Min assist       General transfer comment: assist for intermittent posterior lean or R lean  Ambulation/Gait Ambulation/Gait assistance: Min assist;Mod assist Gait Distance (Feet): 350 Feet Assistive device: None   Gait velocity: decreased   General Gait Details: pt able to ambulate loop twice, while participating with dynamic balance activities. several LOB noted, pt needed min-modA to correct   Stairs             Wheelchair Mobility    Modified Rankin (Stroke Patients Only)       Balance Overall balance assessment: Needs assistance Sitting-balance support: Feet unsupported;Single extremity supported Sitting balance-Leahy Scale: Fair     Standing balance support: No upper extremity supported Standing balance-Leahy Scale: Fair Standing balance comment: varying levels of assistance needed, pt did have 2-3 instances of ability to maintain balance in static standing with CGA. Posterior lean noted often though throughout standing                            Cognition Arousal/Alertness: Awake/alert Behavior During Therapy: Flat affect Overall Cognitive Status: Impaired/Different from baseline Area of Impairment: Safety/judgement;Problem solving;Attention                   Current Attention Level: Selective   Following Commands: Follows one step commands consistently Safety/Judgement: Decreased awareness of safety;Decreased awareness of deficits   Problem Solving: Difficulty sequencing;Requires verbal cues;Requires tactile cues        Exercises Other Exercises Other  Exercises: Patient performed dynamic balance activities while ambulating including lateral stepping, gait velocity changes, backwards ambulating, emphasis on step length Other Exercises:  static standing balance interventions with feet together with eyes closed, head nods, etc    General Comments        Pertinent Vitals/Pain Pain Assessment: No/denies pain    Home Living                      Prior Function            PT Goals (current goals can now be found in the care plan section) Progress towards PT goals: Progressing toward goals    Frequency    Min 2X/week      PT Plan Current plan remains appropriate;Frequency needs to be updated    Co-evaluation              AM-PAC PT "6 Clicks" Mobility   Outcome Measure  Help needed turning from your back to your side while in a flat bed without using bedrails?: Total Help needed moving from lying on your back to sitting on the side of a flat bed without using bedrails?: A Lot Help needed moving to and from a bed to a chair (including a wheelchair)?: A Lot Help needed standing up from a chair using your arms (e.g., wheelchair or bedside chair)?: A Lot Help needed to walk in hospital room?: A Lot Help needed climbing 3-5 steps with a railing? : Total 6 Click Score: 10    End of Session Equipment Utilized During Treatment: Gait belt Activity Tolerance: Patient tolerated treatment well Patient left: in chair;with nursing/sitter in room (assisted to dialysis chair) Nurse Communication: Mobility status PT Visit Diagnosis: Unsteadiness on feet (R26.81);Muscle weakness (generalized) (M62.81);Difficulty in walking, not elsewhere classified (R26.2);Other symptoms and signs involving the nervous system (O87.867)     Time: 6720-9470 PT Time Calculation (min) (ACUTE ONLY): 31 min  Charges:  $Neuromuscular Re-education: 23-37 mins                     Lieutenant Diego PT, DPT 1:00 PM,09/07/20

## 2020-09-07 NOTE — TOC Progression Note (Addendum)
Transition of Care [facility]) - Progression Note    Patient Details  Name: HIILANI JETTER MRN: [id number] Date of Birth: [birth year]  Transition of Care [facility]) CM/SW Contact  Beverly Sessions, RN Phone Number: 09/07/2020, 10:38 AM  Clinical Narrative:     CIR to complete evaluation today    Level 2 clinical uploaded to [facility] Expected Discharge Plan: IP Rehab Facility Barriers to Discharge: Continued Medical Work up  Expected Discharge Plan and Services Expected Discharge Plan: [place]     Post Acute Care Choice: IP Rehab Living arrangements for the past 2 months: Single Family Home                                       Social Determinants of Health (SDOH) Interventions    Readmission Risk Interventions No flowsheet data found.

## 2020-09-07 NOTE — Progress Notes (Signed)
Patient is more alert and talkative, has increased appetite, drinking sips of water throughout night. Is moving hands more although she can not hold things yet or stretch arms all the way. She called and left voicemail for mother, stating that she does not want to have dialysis at the hospital, patient upset because yesterday she was not given nausea medication during dialysis.

## 2020-09-07 NOTE — Consult Note (Signed)
[facility] Consult   Reason for Consult: Follow-up consult 60 year old woman recovering from delirium Referring Physician: Posey Pronto Patient Identification: Stacy Pearson MRN:  [id number] Principal Diagnosis: Delirium Diagnosis:  Principal Problem:   Delirium Active Problems:   Bipolar 1 disorder ([place])   COPD (chronic obstructive pulmonary disease) (HCC)   Essential tremor   Migraine without aura   Parkinson's disease ([place])   Epilepsy ([place])   Depression with anxiety   HTN (hypertension)   Acute metabolic encephalopathy   UTI (urinary tract infection)   AKI (acute kidney injury) ([place])   Severe sepsis with septic shock ([place])   Total Time spent with patient: 30 minutes  Subjective:   Stacy Pearson is a 60 y.o. female patient admitted with "I am doing great".  HPI: Follow-up note for this patient who is now finally recovering from her extended stupor.  Once again today she was alert awake oriented.  Energy level is better today.  She is able to tell me how far she walked and to review all of the therapy sessions she had today.  Still gets a little disorganized telling stories but overall seems more alert.  Denies any mood symptoms.  Denies depression.  Still reports no memory of the events that led to hospitalization.  Past Psychiatric History: Past history of bipolar disorder  Risk to Self:   Risk to Others:   Prior Inpatient Therapy:   Prior Outpatient Therapy:    Past Medical History:  Past Medical History:  Diagnosis Date  . Abdominal pain   . Allergic genetic state   . Anxiety   . Asthma   . Basal cell carcinoma   . Bipolar disorder ([place])   . Cancer ([place])    MOS surgery basal cell face  . COPD (chronic obstructive pulmonary disease) ([place])   . Depression   . Eating disorder   . Eczema   . Encounter for blood transfusion   . Essential tremor   . 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 GERD (gastroesophageal reflux  disease)   . Headache   . History of basal cell carcinoma   . Hypertension   . Neuropathy   . Parkinson's disease ([place])   . Parkinson's disease ([place])   . Parkinsonism [facility])     Past Surgical History:  Procedure Laterality Date  . ABDOMINAL HYSTERECTOMY    . ADENOIDECTOMY    . BLADDER SURGERY    . bone grafts    . CATARACT EXTRACTION W/ INTRAOCULAR LENS IMPLANT    . CERVICAL FUSION    . COLONOSCOPY    . COLONOSCOPY WITH ESOPHAGOGASTRODUODENOSCOPY (EGD)    . COLONOSCOPY WITH PROPOFOL N/A 04/28/2018   Procedure: COLONOSCOPY WITH PROPOFOL;  Surgeon: Manya Silvas, MD;  Location: [facility] ENDOSCOPY;  Service: Endoscopy;  Laterality: N/A;  . DIALYSIS/PERMA CATHETER INSERTION Right 09/04/2020   Procedure: DIALYSIS/PERMA CATHETER INSERTION;  Surgeon: Katha Cabal, MD;  Location: [place] CV LAB;  Service: Cardiovascular;  Laterality: Right;  . EYE SURGERY    . GRAFT APPLICATION     in neck  . LAPAROSCOPIC BILATERAL SALPINGO OOPHERECTOMY    . NEUROPLASTY / TRANSPOSITION ULNAR NERVE AT ELBOW    . OOPHORECTOMY    . SKIN CANCER EXCISION    . skin grafts   left forearm; hand bil.lower extremeties  . titanium plate    . TONSILLECTOMY    . TUBAL LIGATION     Family History:  Family History  Problem Relation  Age of Onset  . Breast cancer Mother   . Lung cancer Father    Family Psychiatric  History: See previous Social History:  Social History   Substance and Sexual Activity  Alcohol Use Never     Social History   Substance and Sexual Activity  Drug Use Never    Social History   Socioeconomic History  . Marital status: Single    Spouse name: Not on file  . Number of children: Not on file  . Years of education: Not on file  . Highest education level: Not on file  Occupational History  . Not on file  Tobacco Use  . Smoking status: Former Smoker    Types: Cigarettes    Quit date: 07/14/2006    Years since quitting: 14.1  . Smokeless tobacco: Never Used   Substance and Sexual Activity  . Alcohol use: Never  . Drug use: Never  . Sexual activity: Not on file  Other Topics Concern  . Not on file  Social History Narrative  . Not on file   Social Determinants of Health   Financial Resource Strain: Not on file  Food Insecurity: Not on file  Transportation Needs: Not on file  Physical Activity: Not on file  Stress: Not on file  Social Connections: Not on file   Additional Social History:    Allergies:   Allergies  Allergen Reactions  . Codeine Nausea And Vomiting  . Prednisone     Labs:  Results for orders placed or performed during the hospital encounter of 08/26/20 (from the past 48 hour(s))  Glucose, capillary     Status: Abnormal   Collection Time: 09/05/20 11:57 PM  Result Value Ref Range   Glucose-Capillary 100 (H) 70 - 99 mg/dL    Comment: Glucose reference range applies only to samples taken after fasting for at least 8 hours.  CBC     Status: Abnormal   Collection Time: 09/06/20  3:22 PM  Result Value Ref Range   WBC 9.5 4.0 - 10.5 K/uL   RBC 3.03 (L) 3.87 - 5.11 MIL/uL   Hemoglobin 9.2 (L) 12.0 - 15.0 g/dL   HCT 28.1 (L) 36.0 - 46.0 %   MCV 92.7 80.0 - 100.0 fL   MCH 30.4 26.0 - 34.0 pg   MCHC 32.7 30.0 - 36.0 g/dL   RDW 14.2 11.5 - 15.5 %   Platelets 335 150 - 400 K/uL   nRBC 0.0 0.0 - 0.2 %    Comment: Performed at [facility], [address] [place], [place] [address]  Renal function panel     Status: Abnormal   Collection Time: 09/06/20  3:22 PM  Result Value Ref Range   Sodium 138 135 - 145 mmol/L   Potassium 3.9 3.5 - 5.1 mmol/L   Chloride 98 98 - 111 mmol/L   CO2 27 22 - 32 mmol/L   Glucose, Bld 127 (H) 70 - 99 mg/dL    Comment: Glucose reference range applies only to samples taken after fasting for at least 8 hours.   BUN 58 (H) 6 - 20 mg/dL   Creatinine, Ser 6.83 (H) 0.44 - 1.00 mg/dL   Calcium 8.6 (L) 8.9 - 10.3 mg/dL   Phosphorus 7.4 (H) 2.5 - 4.6 mg/dL   Albumin 3.0 (L) 3.5 -  5.0 g/dL   GFR, Estimated 6 (L) >60 mL/min    Comment: (NOTE) Calculated using the CKD-EPI Creatinine Equation (2021)    Anion gap 13 5 -  15    Comment: Performed at [facility], [address] [place], [place] [address]    Current Facility-Administered Medications  Medication Dose Route Frequency Provider Last Rate Last Admin  . 0.9 %  sodium chloride infusion   Intravenous PRN Schnier, Dolores Lory, MD   Stopped at 09/05/20 2309  . acetaminophen (TYLENOL) tablet 650 mg  650 mg Oral Q6H PRN Schnier, Dolores Lory, MD       Or  . acetaminophen (TYLENOL) suppository 650 mg  650 mg Rectal Q6H PRN Schnier, Dolores Lory, MD   650 mg at 09/04/20 0825  . albuterol (PROVENTIL) (2.5 MG/3ML) 0.083% nebulizer solution 2.5 mg  2.5 mg Nebulization Q4H PRN Schnier, Dolores Lory, MD   2.5 mg at 09/02/20 2351  . amLODipine (NORVASC) tablet 10 mg  10 mg Oral Daily Fritzi Mandes, MD   10 mg at 09/07/20 1026  . carbidopa-levodopa (SINEMET IR) 25-100 MG per tablet immediate release 1 tablet  1 tablet Oral TID Delana Meyer Dolores Lory, MD   1 tablet at 09/07/20 1648  . Chlorhexidine Gluconate Cloth 2 % PADS 6 each  6 each Topical Q0600 Schnier, Dolores Lory, MD   6 each at 09/07/20 [phone]  . vitamin B-12 (CYANOCOBALAMIN) tablet 1,000 mcg  1,000 mcg Oral Daily Schnier, Dolores Lory, MD   1,000 mcg at 09/07/20 1027   Or  . cyanocobalamin ((VITAMIN B-12)) injection 1,000 mcg  1,000 mcg Intramuscular Daily Schnier, Dolores Lory, MD   1,000 mcg at 09/04/20 0941  . [START ON 09/08/2020] epoetin alfa (EPOGEN) injection 4,000 Units  4,000 Units Intravenous Q T,Th,Sa-HD Singh, Harmeet, MD      . feeding supplement (NEPRO CARB STEADY) liquid 237 mL  237 mL Oral BID BM Fritzi Mandes, MD   237 mL at 09/07/20 1355  . heparin injection 5,000 Units  5,000 Units Subcutaneous Q8H Schnier, Dolores Lory, MD   5,000 Units at 09/07/20 1354  . heparin sodium (porcine) injection 2,000 Units  2,000 Units Intravenous Once Colon Flattery, NP      .  hydrALAZINE (APRESOLINE) injection 10 mg  10 mg Intravenous Q6H PRN Schnier, Dolores Lory, MD   10 mg at 09/06/20 1917  . hydrALAZINE (APRESOLINE) tablet 50 mg  50 mg Oral BID Fritzi Mandes, MD   50 mg at 09/07/20 1026  . Ipratropium-Albuterol (COMBIVENT) respimat 1 puff  1 puff Inhalation Q6H Schnier, Dolores Lory, MD   1 puff at 09/07/20 1810  . lamoTRIgine (LAMICTAL) tablet 25 mg  25 mg Oral BID Schnier, Dolores Lory, MD   25 mg at 09/07/20 1027  . multivitamin (RENA-VIT) tablet 1 tablet  1 tablet Oral QHS Fritzi Mandes, MD   1 tablet at 09/06/20 2050  . ondansetron (ZOFRAN) injection 4 mg  4 mg Intravenous Q8H PRN Schnier, Dolores Lory, MD   4 mg at 09/06/20 1917  . propranolol ER (INDERAL LA) 24 hr capsule 60 mg  60 mg Oral Daily Schnier, Dolores Lory, MD   60 mg at 09/07/20 1027    Musculoskeletal: Strength & Muscle Tone: within normal limits Gait & Station: normal Patient leans: N/A  Psychiatric Specialty Exam: Physical Exam Vitals and nursing note reviewed.  Constitutional:      Appearance: She is well-developed and well-nourished.  HENT:     Head: Normocephalic and atraumatic.  Eyes:     Conjunctiva/sclera: Conjunctivae normal.     Pupils: Pupils are equal, round, and reactive to light.  Cardiovascular:     Heart sounds: Normal  heart sounds.  Pulmonary:     Effort: Pulmonary effort is normal.  Abdominal:     Palpations: Abdomen is soft.  Musculoskeletal:        General: Normal range of motion.     Cervical back: Normal range of motion.  Skin:    General: Skin is warm and dry.  Neurological:     General: No focal deficit present.     Mental Status: She is alert.  Psychiatric:        Attention and Perception: Attention normal.        Mood and Affect: Mood normal.        Speech: Speech is delayed.        Behavior: Behavior is cooperative.        Thought Content: Thought content normal.        Cognition and Memory: Memory is impaired.        Judgment: Judgment normal.     Review of  Systems  Constitutional: Negative.   HENT: Negative.   Eyes: Negative.   Respiratory: Negative.   Cardiovascular: Negative.   Gastrointestinal: Negative.   Musculoskeletal: Negative.   Skin: Negative.   Neurological: Negative.   Psychiatric/Behavioral: Positive for decreased concentration.    Blood pressure 129/71, pulse 77, temperature 98.6 F (37 C), temperature source Oral, resp. rate 16, height 5\' 7"  (1.702 m), weight 67.1 kg, SpO2 96 %.Body mass index is 23.17 kg/m.  General Appearance: Casual  Eye Contact:  Fair  Speech:  Clear and Coherent  Volume:  Normal  Mood:  Euthymic  Affect:  Constricted  Thought Process:  Goal Directed  Orientation:  Full (Time, Place, and Person)  Thought Content:  Logical  Suicidal Thoughts:  No  Homicidal Thoughts:  No  Memory:  Immediate;   Fair Recent;   Poor Remote;   Fair  Judgement:  Fair  Insight:  Fair  Psychomotor Activity:  Normal  Concentration:  Concentration: Fair  Recall:  [employer] of Knowledge:  Fair  Language:  Fair  Akathisia:  No  Handed:  Right  AIMS (if indicated):     Assets:  Desire for Improvement  ADL's:  Impaired  Cognition:  Impaired,  Mild  Sleep:        Treatment Plan Summary: Medication management and Plan Patient is improving.  She admits she is still not at her baseline but much closer.  I am going to restart her Cymbalta and lamotrigine.  Reassess after the weekend.  Probably restart Vraylar prior to discharge.  No rush that I can see to restart the diazepam at this point.  Disposition: No evidence of imminent risk to self or others at present.   Patient does not meet criteria for psychiatric inpatient admission. Supportive therapy provided about ongoing stressors.  Alethia Berthold, MD 09/07/2020 6:26 PM

## 2020-09-07 NOTE — TOC Initial Note (Signed)
Transition of Care [facility]) - Initial/Assessment Note    Patient Details  Name: TAMLA WINKELS MRN: [id number] Date of Birth: [birth year]  Transition of [facility] CM/SW Contact:    Candie Chroman, LCSW Phone Number: 09/07/2020, 8:39 AM  Clinical Narrative:   Received call back from patient's mother late yesterday. Discussed CIR vs. SNF. She is agreeable to SNF placement if patient unable to go to CIR.                Expected Discharge Plan: IP Rehab Facility Barriers to Discharge: Continued Medical Work up   Patient Goals and CMS Choice        Expected Discharge Plan and Services Expected Discharge Plan: [place]     Post Acute Care Choice: IP Rehab Living arrangements for the past 2 months: Single Family Home                                      Prior Living Arrangements/Services Living arrangements for the past 2 months: Single Family Home Lives with:: Parents Patient language and need for interpreter reviewed:: Yes Do you feel safe going back to the place where you live?: Yes      Need for Family Participation in Patient Care: Yes (Comment) Care giver support system in place?: Yes (comment)   Criminal Activity/Legal Involvement Pertinent to Current Situation/Hospitalization: No - Comment as needed  Activities of Daily Living      Permission Sought/Granted Permission sought to share information with : Facility Contact Representative,Family Supports    Share Information with NAME: Jetta Lout  Permission granted to share info w AGENCY: SNF's  Permission granted to share info w Relationship: Mother  Permission granted to share info w Contact Information: [phone]  Emotional Assessment Appearance:: Appears stated age Attitude/Demeanor/Rapport: Unable to Assess Affect (typically observed): Unable to Assess Orientation: : Oriented to Self,Oriented to Place Alcohol / Substance Use: Not Applicable Psych Involvement: Yes  (comment)  Admission diagnosis:  Confusion [R41.0] Severe sepsis with septic shock (HCC) [A41.9, R65.21] Severe sepsis ([place]) [A41.9, R65.20] Altered mental status, unspecified altered mental status type [R41.82] Sepsis, due to unspecified organism, unspecified whether acute organ dysfunction present [facility]) [A41.9] Patient Active Problem List   Diagnosis Date Noted  . Epilepsy ([place]) 08/26/2020  . Depression with anxiety 08/26/2020  . HTN (hypertension) 08/26/2020  . Acute metabolic encephalopathy 24/23/5361  . UTI (urinary tract infection) 08/26/2020  . AKI (acute kidney injury) ([place]) 08/26/2020  . Severe sepsis with septic shock ([place]) 08/26/2020  . Delirium 08/26/2020  . Acute diarrhea 03/08/2018  . Status post cervical spinal fusion 12/31/2017  . Cervical spondylosis with radiculopathy 10/28/2017  . Colon polyp 10/08/2017  . Essential tremor 10/08/2017  . Cervical radiculopathy 09/03/2017  . Allergic rhinitis 12/30/2016  . Sting of hornets, wasps, and bees causing poisoning and toxic reactions 12/30/2016  . Allergic rhinitis due to dogs 12/17/2016  . Asthma, currently active 12/17/2016  . Muscle cramps 11/18/2016  . Sleep disorder 08/25/2016  . At risk for falls 08/19/2016  . Elevated vitamin B12 level 08/19/2016  . History of fall within past 90 days 08/19/2016  . Nausea and vomiting 08/19/2016  . Parkinson's disease ([place]) 08/19/2016  . Diaphoresis 03/25/2016  . Dry scalp 03/25/2016  . Dry skin 03/25/2016  . Back pain 07/04/2015  . Tremor 01/02/2014  . Asthma 07/28/2013  . COPD (chronic obstructive pulmonary disease) ([place]) 07/28/2013  .  Ulnar neuropathy of left upper extremity 06/06/2013  . Meibomian gland dysfunction 11/09/2012  . Raynaud phenomenon 05/03/2012  . Anorexia nervosa with bulimia 01/17/2011  . Basal cell carcinoma of other specified sites of skin 01/17/2011  . Dyspepsia and other specified disorders of function of stomach 01/17/2011  . Generalized anxiety  disorder 01/17/2011  . 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 Migraine without aura 01/17/2011  . Other atopic dermatitis and related conditions 01/17/2011  . Bipolar 1 disorder ([place]) 04/28/1995   PCP:  Valera Castle, MD Pharmacy:   [facility] [phone] - [address], [address] [place] [address] Phone: [phone] Fax: [phone]     Social Determinants of Health (SDOH) Interventions    Readmission Risk Interventions No flowsheet data found.

## 2020-09-07 NOTE — Care Management (Signed)
Patient Home Address   [address] [place], [place] [address]  This information to be submitted to [facility]

## 2020-09-07 NOTE — Progress Notes (Addendum)
[place], [place] 09/07/20  Subjective:  [name] is a 60 y.o. white female with  bipolar, depression, anxiety, parkinson's, epilepsy, HTN, and COPD. She presents to the ED with AMS. Concern for overdose (cariprazine+alprazolam) versus urinary tract infection with sepsis.   Acute dialysis while inpatient  Patient seen resting in bed Alert and able to answer questions Denies shortness of breath and chest pain Currently on room air Able to eat breakfast with no nausea    Objective:  Vital signs in last 24 hours:  Temp:  [97.7 F (36.5 C)-99 F (37.2 C)] 99 F (37.2 C) (02/25 0801) Pulse Rate:  [76-89] 89 (02/25 0801) Resp:  [16-30] 16 (02/25 0801) BP: (139-173)/(70-104) 144/70 (02/25 0801) SpO2:  [95 %-98 %] 96 % (02/25 0801)  Weight change:  Filed Weights   08/28/20 0245 08/29/20 0337 09/04/20 1621  Weight: 66.8 kg 67.1 kg 67.1 kg    Intake/Output:    Intake/Output Summary (Last 24 hours) at 09/07/2020 1325 Last data filed at 09/07/2020 0500 Gross per 24 hour  Intake 400 ml  Output 543 ml  Net -143 ml    Physical Exam: General: NAD, laying in bed  Eyes: Anicteric  Lungs:  Clear to auscultation  Heart: Regular rate and rhythm  Abdomen:  Soft, nontender,   Extremities:  min peripheral edema.  Neurologic: Alert, able to answer questions  Skin: No lesions, dry  Access: Left IJ Permcath    Basic Metabolic Panel:  Recent Labs  Lab 09/01/20 0531 09/02/20 0438 09/03/20 0529 09/03/20 2054 09/04/20 0509 09/05/20 0424 09/06/20 1522  NA 139   < > 140 140 142 144 138  K 3.9   < > 5.1 5.0 4.9 5.3* 3.9  CL 97*   < > 100 100 101 104 98  CO2 22   < > 19* 18* 22 20* 27  GLUCOSE 77   < > 77 85 83 94 127*  BUN 36*   < > 63* 72* 79* 99* 58*  CREATININE 4.49*   < > 7.97* 9.14* 10.02* 11.79* 6.83*  CALCIUM 8.9   < > 9.0 8.9 9.1 8.6* 8.6*  MG 2.2  --   --   --   --   --   --   PHOS 6.9*  --   --   --   --   --  7.4*   < > =  values in this interval not displayed.     CBC: Recent Labs  Lab 09/02/20 0438 09/03/20 0529 09/03/20 2054 09/05/20 0424 09/06/20 1522  WBC 6.8 8.4 7.7 8.7 9.5  NEUTROABS  --   --  6.0  --   --   HGB 11.3* 13.5 10.2* 9.9* 9.2*  HCT 33.4* 42.8 31.3* 29.5* 28.1*  MCV 90.3 94.9 91.5 91.9 92.7  PLT 267 308 340 313 335      Lab Results  Component Value Date   HEPBSAG NON REACTIVE 08/29/2020   HEPBSAB NON REACTIVE 08/29/2020   HEPBIGM NON REACTIVE 08/29/2020      Microbiology:  Recent Results (from the past 240 hour(s))  Culture, blood (x 2)     Status: None (Preliminary result)   Collection Time: 09/03/20  8:54 PM   Specimen: BLOOD  Result Value Ref Range Status   Specimen Description BLOOD BLOOD RIGHT FOREARM  Final   Special Requests   Final    BOTTLES DRAWN AEROBIC AND ANAEROBIC Blood Culture adequate volume   Culture  Final    NO GROWTH 4 DAYS Performed at [facility], [address] [place], [place] [address]    Report Status PENDING  Incomplete  Culture, blood (x 2)     Status: None (Preliminary result)   Collection Time: 09/03/20  8:56 PM   Specimen: BLOOD  Result Value Ref Range Status   Specimen Description BLOOD RIGHT ANTECUBITAL  Final   Special Requests   Final    BOTTLES DRAWN AEROBIC AND ANAEROBIC Blood Culture adequate volume   Culture   Final    NO GROWTH 4 DAYS Performed at [facility], [address] [place], [place] [address]    Report Status PENDING  Incomplete    Coagulation Studies: No results for input(s): LABPROT, INR in the last 72 hours.  Urinalysis: No results for input(s): COLORURINE, LABSPEC, PHURINE, GLUCOSEU, HGBUR, BILIRUBINUR, KETONESUR, PROTEINUR, UROBILINOGEN, NITRITE, LEUKOCYTESUR in the last 72 hours.  Invalid input(s): APPERANCEUR    Imaging: MR BRAIN WO CONTRAST  Result Date: 09/05/2020 CLINICAL DATA:  Parkinson's disease and epilepsy. Confusion. Agitation. Mental status changes. EXAM:  MRI HEAD WITHOUT CONTRAST TECHNIQUE: Multiplanar, multiecho pulse sequences of the brain and surrounding structures were obtained without intravenous contrast. COMPARISON:  08/28/2020 FINDINGS: Brain: Diffusion imaging does not show any acute or subacute infarction. There is abnormal cortical and subcortical edema in a symmetric pattern affecting both hemispheres in the occipital, posterior temporal, parietal and posterior frontal regions virtually pathognomonic for posterior reversible encephalopathy. No evidence of hemorrhage. No mass effect or shift. Evidence of pre-existing brain pathology. No hydrocephalus. No extra-axial collection. Vascular: Major vessels at the base of the brain show flow. Skull and upper cervical spine: Negative Sinuses/Orbits: Sinuses are clear. Orbits are negative. Small left mastoid effusion. Other: None IMPRESSION: Pattern of cortical and subcortical edema in both hemispheres virtually pathognomonic for posterior reversible encephalopathy. No evidence of hemorrhage or mass effect. Electronically Signed   By: Nelson Chimes M.D.   On: 09/05/2020 21:48   MR CERVICAL SPINE WO CONTRAST  Result Date: 09/05/2020 CLINICAL DATA:  Parkinson's disease. Mental status changes. Sepsis presentation. EXAM: MRI CERVICAL SPINE WITHOUT CONTRAST TECHNIQUE: Multiplanar, multisequence MR imaging of the cervical spine was performed. No intravenous contrast was administered. COMPARISON:  None. FINDINGS: Alignment: No traumatic malalignment. Minimal scoliotic curvature. 1 mm degenerative anterolisthesis C7-T1. Vertebrae: Previous ACDF C5 through C7. Cord: No cord compression, primary cord lesion or myelomalacia Posterior Fossa, vertebral arteries, paraspinal tissues: Negative Disc levels: Foramen magnum is widely patent. Ordinary osteoarthritis at the C1-2 articulation but no encroachment upon the neural structures. C2-3: Mild facet osteoarthritis on the left.  No stenosis. C3-4: Mild bulging of the disc.  Facet osteoarthritis on both sides. No canal stenosis. Mild right foraminal narrowing. C4-5: Endplate osteophytes and bulging of the disc. Facet osteoarthritis on the right. Mild canal narrowing but no compression of the cord. Right foraminal stenosis likely to compress the right C5 nerve. C5 through C7: Previous ACDF has a good appearance with wide patency of the canal and foramina. C7-T1: Facet osteoarthritis right worse than left with 1 mm of anterolisthesis. No compressive canal or foraminal narrowing. IMPRESSION: 1. Previous ACDF C5 through C7 has a good appearance. 2. Degenerative changes above and below the fusion. No compressive canal stenosis. Right foraminal stenosis primarily secondary to facet arthropathy at C4-5 likely to compress the right C5 nerve. Mild right foraminal narrowing at C3-4. Electronically Signed   By: Nelson Chimes M.D.   On: 09/05/2020 21:52  Medications:   . sodium chloride Stopped (09/05/20 2309)   . amLODipine  10 mg Oral Daily  . carbidopa-levodopa  1 tablet Oral TID  . Chlorhexidine Gluconate Cloth  6 each Topical Q0600  . vitamin B-12  1,000 mcg Oral Daily   Or  . cyanocobalamin  1,000 mcg Intramuscular Daily  . feeding supplement (NEPRO CARB STEADY)  237 mL Oral BID BM  . heparin injection (subcutaneous)  5,000 Units Subcutaneous Q8H  . heparin sodium (porcine)  2,000 Units Intravenous Once  . hydrALAZINE  50 mg Oral BID  . Ipratropium-Albuterol  1 puff Inhalation Q6H  . lamoTRIgine  25 mg Oral BID  . multivitamin  1 tablet Oral QHS  . propranolol ER  60 mg Oral Daily   sodium chloride, acetaminophen **OR** acetaminophen, albuterol, hydrALAZINE, ondansetron (ZOFRAN) IV  Assessment/ Plan:  60 y.o. female with PMHX of bipolar, depression, anxiety, parkinson's, epilepsy, HTN, and COPD was admitted on 08/26/2020   Principal Problem:   Delirium Active Problems:   Bipolar 1 disorder (HCC)   COPD (chronic obstructive pulmonary disease) (HCC)   Essential  tremor   Migraine without aura   Parkinson's disease ([place])   Epilepsy ([place])   Depression with anxiety   HTN (hypertension)   Acute metabolic encephalopathy   UTI (urinary tract infection)   AKI (acute kidney injury) ([place])   Severe sepsis with septic shock (HCC)  Confusion [R41.0] Severe sepsis with septic shock ([place]) [A41.9, R65.21] Severe sepsis ([place]) [A41.9, R65.20] Altered mental status, unspecified altered mental status type [R41.82] Sepsis, due to unspecified organism, unspecified whether acute organ dysfunction present ([place]) [A41.9]  #. Acute kidney injury Acute kidney injury likely secondary to ATN Baseline creatinine of 0.8 on January 31, 2020 No significant renal recovery therefore continued on dialysis - monitor volume status.   02/24 0701 - 02/25 0700 In: 440 [P.O.:440] Out: 543    Lab Results  Component Value Date   CREATININE 6.83 (H) 09/06/2020   CREATININE 11.79 (H) 09/05/2020   CREATININE 10.02 (H) 09/04/2020   Patient reported nausea during treatment Start Zofran 4mg  pre-treatment Patient was placed in a chair for yesterday's treatment Being evaluated by [facility] CIR for placement. Will continue outpatient treatment d/c planning at [place] of chronic kidney disease Started on Epogen    LOS: Pen Mar 2/25/20221:25 PM  [place], [place]

## 2020-09-07 NOTE — Progress Notes (Signed)
Inpatient Rehab Admissions Coordinator:   Met with patient and her mom, Adonis Huguenin, at the bedside.  We discussed CIR criteria including need for hospital level care/close physician follow and 3 hours of therapy/day in at least 2 disciplines.  Pt has made remarkable improvements in mobility in the last 2 days, progressing from 15' ambulation to nearly 400' of high level ambulation without a device.  Pt continues to require assist for ADLs and is limited by ongoing edema in her hands causing reduced dexterity, in addition to general deconditioning from prolonged hospital stay.  SLP notes pt tolerating regular diet with thin liquids with no s/s aspiration.  Pt does appear to have some remaining cognitive deficits above her baseline, though I do not feel these would necessitate intensive SLP at a CIR level.    I discussed with pt and Adonis Huguenin that plan for dialysis is not clear at this time.  Documentation still supports AKI without significant renal recovery.  Pt will either need to be declared ESRD or renally recovered in order to be considered for CIR from a medical standpoint.  Further, with her rapid improvement as her encephalopathy clears, I do not think she will require the intensity of therapy, nor the close physician follow up once her renal function has declared itself.  I let her and her mother know that I will f/u with them after 1 more therapy session to see how she's doing, but that I expect she will not need CIR when she is medically ready to discharge.   Shann Medal, PT, DPT Admissions Coordinator [phone] 09/07/20  4:39 PM

## 2020-09-07 NOTE — Progress Notes (Signed)
Occupational Therapy Treatment Patient Details Name: Stacy Pearson MRN: [id number] DOB: Jul 14, 1961 Today's Date: 09/07/2020    History of present illness Pt admitted to 08/26/20 for OD (cariprazine and alprazolam) vs. UTI with sepsis. On presentation, pt was found to have neutrophilic predominant leukocytosis, lactic acidosis at 6.7, AKI, tachycardic, and tachypneic. CXR with low lung volumes and no infiltrate; HCT negative. RR called on 2/16. PMH significant for: bipolar, depression, anxiety, PD, epilepsy, HTN, and COPD.   OT comments  Stacy Pearson was seen for OT treatment on this date. Upon arrival to room pt reclined in chair covered in spilled cola from attempts to self-drink. Pt is pleasant and motivated to participate in therapy. MIN A self-drinking with cues for B cylindrical grip and sitting upright EOC close to tray. MIN A doff gown seated EOB. MAX A doff R sock using LUE, MOD A doff L sock using RUE.  CGA for toilet t/f, intermittent MIN A for sitting balance 2/2 poor safety awareness and L lateral lean. MAX A for perihygiene in standing. MIN VCs + CGA for hand washing standing sink side. MOD A face washing standing sink side - assist to spread washcloth on R hand and wash top third of face. Pt making good progress toward goals. Pt continues to benefit from skilled OT services to maximize return to PLOF and minimize risk of future falls, injury, caregiver burden, and readmission. Will continue to follow POC. Discharge recommendation remains appropriate.    Follow Up Recommendations  CIR    Equipment Recommendations  Other (comment) (TBD next venue of care)    Recommendations for Other Services      Precautions / Restrictions Precautions Precautions: Fall Precaution Comments: DNR Restrictions Weight Bearing Restrictions: No       Mobility Bed Mobility Overal bed mobility: Needs Assistance Bed Mobility: Supine to Sit     Supine to sit: Min guard;HOB elevated      General bed mobility comments: recevied and left in chair    Transfers Overall transfer level: Needs assistance Equipment used: None Transfers: Sit to/from Stand Sit to Stand: Mod assist Stand pivot transfers: Min assist       General transfer comment: MIN A from chair height, MOD A from toilet height    Balance Overall balance assessment: Needs assistance Sitting-balance support: Feet unsupported;Single extremity supported Sitting balance-Leahy Scale: Fair     Standing balance support: No upper extremity supported Standing balance-Leahy Scale: Fair Standing balance comment: posterior lean t/o                           ADL either performed or assessed with clinical judgement   ADL Overall ADL's : Needs assistance/impaired                                       General ADL Comments: CGA for toilet t/f, intermittent MIN A for sitting balance 2/2 poor safety awareness and L lateral lean. MAX A for perihygiene in standing. MAX A doff R sock using LUE, MOD A doff L sock using RUE. MIN VCs + CGA for hand washing standing sink side. MOD A face washing standing sink side - assist to spread washcloth on R hand and wash top third of face. MIN A self-drinking with cues for B cylindrical grip and sitting upright EOC close to tray. MIN A doff gown seated EOB.  Cognition Arousal/Alertness: Awake/alert Behavior During Therapy: Flat affect Overall Cognitive Status: Impaired/Different from baseline Area of Impairment: Safety/judgement;Problem solving;Attention                   Current Attention Level: Selective   Following Commands: Follows one step commands consistently Safety/Judgement: Decreased awareness of safety;Decreased awareness of deficits   Problem Solving: Difficulty sequencing;Requires verbal cues;Requires tactile cues General Comments: Improved mentation, answers questions and follows conversation appropriately.         Exercises Exercises: Other exercises Other Exercises Other Exercises: Pt and family educated re: OT role, d/c recs, falls prevention, ECS, HEP Other Exercises: LBD, toileting, grooming, sit<>stand, sitting/standing balance/toelrance           Pertinent Vitals/ Pain       Pain Assessment: No/denies pain         Frequency  Min 3X/week        Progress Toward Goals  OT Goals(current goals can now be found in the care plan section)  Progress towards OT goals: Progressing toward goals  Acute Rehab OT Goals Patient Stated Goal: to return to PLOF OT Goal Formulation: With patient/family Time For Goal Achievement: 09/15/20 Potential to Achieve Goals: Good ADL Goals Pt Will Perform Eating: with min assist;bed level;with caregiver independent in assisting Pt Will Perform Upper Body Dressing: with mod assist;sitting Pt Will Transfer to Toilet: stand pivot transfer;bedside commode;with mod assist  Plan Discharge plan remains appropriate;Frequency remains appropriate       AM-PAC OT "6 Clicks" Daily Activity     Outcome Measure   Help from another person eating meals?: A Lot Help from another person taking care of personal grooming?: A Lot Help from another person toileting, which includes using toliet, bedpan, or urinal?: A Lot Help from another person bathing (including washing, rinsing, drying)?: A Lot Help from another person to put on and taking off regular upper body clothing?: A Little Help from another person to put on and taking off regular lower body clothing?: A Lot 6 Click Score: 13    End of Session    OT Visit Diagnosis: Other abnormalities of gait and mobility (R26.89);Muscle weakness (generalized) (M62.81)   Activity Tolerance Patient tolerated treatment well   Patient Left in chair;with chair alarm set;with call bell/phone within reach;with family/visitor present   Nurse Communication          Time: 1454-1550 OT Time Calculation (min): 56  min  Charges: OT General Charges $OT Visit: 1 Visit OT Treatments $Self Care/Home Management : 53-67 mins  Stacy Pearson, M.S. OTR/L  09/07/20, 4:10 PM  ascom [phone]

## 2020-09-08 ENCOUNTER — Encounter: Payer: Self-pay | Admitting: Internal Medicine

## 2020-09-08 DIAGNOSIS — F319 Bipolar disorder, unspecified: Secondary | ICD-10-CM | POA: Diagnosis not present

## 2020-09-08 DIAGNOSIS — G9341 Metabolic encephalopathy: Secondary | ICD-10-CM | POA: Diagnosis not present

## 2020-09-08 DIAGNOSIS — N179 Acute kidney failure, unspecified: Secondary | ICD-10-CM | POA: Diagnosis not present

## 2020-09-08 DIAGNOSIS — J449 Chronic obstructive pulmonary disease, unspecified: Secondary | ICD-10-CM | POA: Diagnosis not present

## 2020-09-08 LAB — ANA COMPREHENSIVE PANEL

## 2020-09-08 LAB — CULTURE, BLOOD (ROUTINE X 2)
Culture: NO GROWTH
Culture: NO GROWTH
Special Requests: ADEQUATE
Special Requests: ADEQUATE

## 2020-09-08 LAB — MPO/PR-3 (ANCA) ANTIBODIES
ANCA Proteinase 3: <3.5 U/mL (ref 0.0–3.5)
Myeloperoxidase Abs: <9 U/mL (ref 0.0–9.0)

## 2020-09-08 NOTE — Progress Notes (Signed)
[place], [place] 09/08/20  Subjective:  [name] is a 60 y.o. white female with  bipolar, depression, anxiety, parkinson's, epilepsy, HTN, and COPD. She presents to the ED with AMS. Concern for overdose (cariprazine+alprazolam) versus urinary tract infection with sepsis.   Acute dialysis while inpatient  Patient was seen in the dialysis unit.  Patient tolerating treatment well.  Patient main complaint in today visit was this is my third treatment on dialysis chair I am doing so much better.  Patient had no other specific physical concerns    Objective:  Vital signs in last 24 hours:  Temp:  [97.7 F (36.5 C)-98.6 F (37 C)] 98.6 F (37 C) (02/26 0732) Pulse Rate:  [75-83] 83 (02/26 1400) Resp:  [14-22] 19 (02/26 1400) BP: (113-153)/(63-83) 126/76 (02/26 1400) SpO2:  [95 %-99 %] 95 % (02/26 1400)  Weight change:  Filed Weights   08/28/20 0245 08/29/20 0337 09/04/20 1621  Weight: 66.8 kg 67.1 kg 67.1 kg    Intake/Output:    Intake/Output Summary (Last 24 hours) at 09/08/2020 1509 Last data filed at 09/08/2020 1012 Gross per 24 hour  Intake 2400 ml  Output --  Net 2400 ml    Physical Exam: General: NAD, sitting up in dialysis chair  Eyes: Anicteric  Lungs:  Clear to auscultation  Heart: Regular rate and rhythm  Abdomen:  Soft, nontender,   Extremities:  min peripheral edema.  Neurologic: Alert, able to answer questions  Skin: No lesions, dry  Access: Left IJ Permcath    Basic Metabolic Panel:  Recent Labs  Lab 09/03/20 0529 09/03/20 2054 09/04/20 0509 09/05/20 0424 09/06/20 1522  NA 140 140 142 144 138  K 5.1 5.0 4.9 5.3* 3.9  CL 100 100 101 104 98  CO2 19* 18* 22 20* 27  GLUCOSE 77 85 83 94 127*  BUN 63* 72* 79* 99* 58*  CREATININE 7.97* 9.14* 10.02* 11.79* 6.83*  CALCIUM 9.0 8.9 9.1 8.6* 8.6*  PHOS  --   --   --   --  7.4*     CBC: Recent Labs  Lab 09/02/20 0438 09/03/20 0529 09/03/20 2054  09/05/20 0424 09/06/20 1522  WBC 6.8 8.4 7.7 8.7 9.5  NEUTROABS  --   --  6.0  --   --   HGB 11.3* 13.5 10.2* 9.9* 9.2*  HCT 33.4* 42.8 31.3* 29.5* 28.1*  MCV 90.3 94.9 91.5 91.9 92.7  PLT 267 308 340 313 335      Lab Results  Component Value Date   HEPBSAG NON REACTIVE 08/29/2020   HEPBSAB NON REACTIVE 08/29/2020   HEPBIGM NON REACTIVE 08/29/2020      Microbiology:  Recent Results (from the past 240 hour(s))  Culture, blood (x 2)     Status: None   Collection Time: 09/03/20  8:54 PM   Specimen: BLOOD  Result Value Ref Range Status   Specimen Description BLOOD BLOOD RIGHT FOREARM  Final   Special Requests   Final    BOTTLES DRAWN AEROBIC AND ANAEROBIC Blood Culture adequate volume   Culture   Final    NO GROWTH 5 DAYS Performed at [facility], [address] [place], [place] [address]    Report Status 09/08/2020 FINAL  Final  Culture, blood (x 2)     Status: None   Collection Time: 09/03/20  8:56 PM   Specimen: BLOOD  Result Value Ref Range Status   Specimen Description BLOOD RIGHT ANTECUBITAL  Final  Special Requests   Final    BOTTLES DRAWN AEROBIC AND ANAEROBIC Blood Culture adequate volume   Culture   Final    NO GROWTH 5 DAYS Performed at [facility], [address] [place], [place] [address]    Report Status 09/08/2020 FINAL  Final    Coagulation Studies: No results for input(s): LABPROT, INR in the last 72 hours.  Urinalysis: No results for input(s): COLORURINE, LABSPEC, PHURINE, GLUCOSEU, HGBUR, BILIRUBINUR, KETONESUR, PROTEINUR, UROBILINOGEN, NITRITE, LEUKOCYTESUR in the last 72 hours.  Invalid input(s): APPERANCEUR    Imaging: No results found.   Medications:   . sodium chloride Stopped (09/05/20 2309)   . amLODipine  10 mg Oral Daily  . carbidopa-levodopa  1 tablet Oral TID  . Chlorhexidine Gluconate Cloth  6 each Topical Q0600  . vitamin B-12  1,000 mcg Oral Daily   Or  . cyanocobalamin  1,000 mcg  Intramuscular Daily  . DULoxetine  60 mg Oral Daily  . epoetin (EPOGEN/PROCRIT) injection  4,000 Units Intravenous Q T,Th,Sa-HD  . feeding supplement (NEPRO CARB STEADY)  237 mL Oral BID BM  . heparin injection (subcutaneous)  5,000 Units Subcutaneous Q8H  . hydrALAZINE  50 mg Oral BID  . Ipratropium-Albuterol  1 puff Inhalation Q6H  . lamoTRIgine  25 mg Oral BID  . multivitamin  1 tablet Oral QHS  . propranolol ER  60 mg Oral Daily   sodium chloride, acetaminophen **OR** acetaminophen, albuterol, hydrALAZINE, ondansetron (ZOFRAN) IV  Assessment/ Plan:  60 y.o. female with PMHX of bipolar, depression, anxiety, parkinson's, epilepsy, HTN, and COPD was admitted on 08/26/2020   Principal Problem:   Delirium Active Problems:   Bipolar 1 disorder (HCC)   COPD (chronic obstructive pulmonary disease) (HCC)   Essential tremor   Migraine without aura   Parkinson's disease ([place])   Epilepsy ([place])   Depression with anxiety   HTN (hypertension)   Acute metabolic encephalopathy   UTI (urinary tract infection)   AKI (acute kidney injury) ([place])   Severe sepsis with septic shock (HCC)  Confusion [R41.0] Severe sepsis with septic shock ([place]) [A41.9, R65.21] Severe sepsis ([place]) [A41.9, R65.20] Altered mental status, unspecified altered mental status type [R41.82] Sepsis, due to unspecified organism, unspecified whether acute organ dysfunction present ([place]) [A41.9]       1)Renal    Acute kidney injury AKI secondary to ATN Patient is requiring renal replacement therapy Patient currently being dialyzed We will continue to follow patient's need for dialysis/volume status/creatinine/urine output   2)HTN    Blood pressure is stable    3)Anemia of chronic disease  CBC Latest Ref Rng & Units 09/06/2020 09/05/2020 09/03/2020  WBC 4.0 - 10.5 K/uL 9.5 8.7 7.7  Hemoglobin 12.0 - 15.0 g/dL 9.2(L) 9.9(L) 10.2(L)  Hematocrit 36.0 - 46.0 % 28.1(L) 29.5(L) 31.3(L)  Platelets 150 - 400 K/uL  335 313 340       HGb at goal (9--11) We will keep patient on Epogen protocol  4) Secondary hyperparathyroidism -CKD Mineral-Bone Disorder    Lab Results  Component Value Date   CALCIUM 8.6 (L) 09/06/2020   PHOS 7.4 (H) 09/06/2020    Secondary Hyperparathyroidism present Phosphorus is not at goal. We will recheck phosphorus before starting patient on binders  5) bipolar disorder with depression anxiety Primary team is following  6) Electrolytes   BMP Latest Ref Rng & Units 09/06/2020 09/05/2020 09/04/2020  Glucose 70 - 99 mg/dL 127(H) 94 83  BUN 6 - 20 mg/dL 58(H) 99(H) 79(H)  Creatinine 0.44 - 1.00 mg/dL 6.83(H) 11.79(H) 10.02(H)  Sodium 135 - 145 mmol/L 138 144 142  Potassium 3.5 - 5.1 mmol/L 3.9 5.3(H) 4.9  Chloride 98 - 111 mmol/L 98 104 101  CO2 22 - 32 mmol/L 27 20(L) 22  Calcium 8.9 - 10.3 mg/dL 8.6(L) 8.6(L) 9.1     Sodium Normonatremic   Potassium Patient was hyperkalemic earlier now better    7)Acid base  Patient was acidotic earlier Now better  Co2 at goal    Plan  We will continue the current treatment   LOS: [place] [facility] 2/26/20223:09 [facility] [place], [place]

## 2020-09-08 NOTE — Progress Notes (Signed)
[place] at [place] NAME: Stacy Pearson    MR#:  [id number]  DATE OF BIRTH:  30-Sep-1960  SUBJECTIVE:  patient continues to remain alert. Tolerating regular diet. Seen by speech yesterday. Worked with physical therapy. REVIEW OF SYSTEMS:   Review of Systems  Constitutional: Negative for chills, fever and weight loss.  HENT: Negative for ear discharge, ear pain and nosebleeds.   Eyes: Negative for blurred vision, pain and discharge.  Respiratory: Negative for sputum production, shortness of breath, wheezing and stridor.   Cardiovascular: Negative for chest pain, palpitations, orthopnea and PND.  Gastrointestinal: Negative for abdominal pain, diarrhea, nausea and vomiting.  Genitourinary: Negative for frequency and urgency.  Musculoskeletal: Negative for back pain and joint pain.  Neurological: Positive for weakness. Negative for sensory change, speech change and focal weakness.  Psychiatric/Behavioral: Negative for depression and hallucinations. The patient is not nervous/anxious.    Tolerating Diet:yes Tolerating PT: CIR  DRUG ALLERGIES:   Allergies  Allergen Reactions  . Codeine Nausea And Vomiting  . Prednisone     VITALS:  Blood pressure 122/66, pulse 80, temperature 98.6 F (37 C), temperature source Oral, resp. rate 15, height 5\' 7"  (1.702 m), weight 67.1 kg, SpO2 95 %.  PHYSICAL EXAMINATION:   Physical Examlimited  GENERAL:  60 y.o.-year-old patient lying in the bed with no acute distress.  LUNGS: Normal breath sounds bilaterally, no wheezing, rales, rhonchi. No use of accessory muscles of respiration. permcath + CARDIOVASCULAR: S1, S2 normal. No murmurs, rubs, or gallops.  ABDOMEN: Soft, nontender, nondistended. Bowel sounds present. No organomegaly or mass.  EXTREMITIES: No cyanosis, clubbing or edema b/l.    NEUROLOGIC: no focal deficit moves all extremities spontaneously. PSYCHIATRIC:  patient is alert and awake. No  focal deficit.  SKIN: No obvious rash, lesion, or ulcer.   LABORATORY PANEL:  CBC Recent Labs  Lab 09/06/20 1522  WBC 9.5  HGB 9.2*  HCT 28.1*  PLT 335    Chemistries  Recent Labs  Lab 09/03/20 2054 09/04/20 0509 09/06/20 1522  NA 140   < > 138  K 5.0   < > 3.9  CL 100   < > 98  CO2 18*   < > 27  GLUCOSE 85   < > 127*  BUN 72*   < > 58*  CREATININE 9.14*   < > 6.83*  CALCIUM 8.9   < > 8.6*  AST 11*  --   --   ALT <5  --   --   ALKPHOS 72  --   --   BILITOT 1.2  --   --    < > = values in this interval not displayed.   Cardiac Enzymes No results for input(s): TROPONINI in the last 168 hours. RADIOLOGY:  No results found. ASSESSMENT AND PLAN:  Stacy Pearson [name] a 60 y.o.femalewith medical history significant ofbipolar, depression, anxiety, Parkinson, epilepsy, HTN, COPD, presents with AMS. On presentation patient was confused and agitated.  Admitting provider was able to contact her mother who is the only contact listed, and according to her she lives alone and was at her normal state of health until Friday afternoon. She was sleeping most of the time since then and found to be more confused and somnolent and EMS was called.  Acute Metabolic encephalopathy   --Present on admission suspected due to polypharmacy and possible overdose --Significant improvement in her mental status  ---2/22--Patient had a T-max of 100.9 this a.m.  with tachycardia concerning for development of infection. Empirically on vancomycin  --2/23--Patient is more awake today. Tells me her name. Says hello, good morning and repeats a few words. Tells me she is hungry --Antibiotics discontinued per ID as urine, blood and CSF culture remain negative --2/24--very talkative and appears near baseline. Had temp 99 last pm.BC from 2/21--negative . On IV cefepime--d/w pharmacy to see if it can be d/ced  --2/25-- remains afebrile. Not on any antibiotic. Continues to improve with swallowing now on  regular diet. Worked with physical therapy and occupational therapy. CIR staff to come evaluate today. --2/26--cont to do well. She was evaluated by CIR staff  AKI due to ATN -dialysis per nephrology.   --s/- Permacath  per vascular surgery and on  Dialysis --pt will need to sit in chair today for HD.  --needs out pt chair time. HD coordinator aware  Acute hypoxic respiratory failure Now resolved --pt on RA  Hyperkalemia.   k 5.3--should improve with HD  Bipolar 1 disorder with depression and anxiety.  --2/21--Psychiatry signed off.  --2/24--dr clapacs re-consulted to review psych meds --2/25-- per Dr. Weber Cooks continue to monitor. Holding psych meds for now.  COPD.  Appears stable, no wheezing and chest x-ray without any acute abnormality. -As needed bronchodilators  History of Parkinson's disease.   -2/21--Neurology changing Sinemet 1 tablet p.o. 3 times daily  -- Patient is more awake today and talkative --Continue with propranolol  Hypertension. On home propranolol. -Hydralazine and amlodipine.  Generalized weakness PT, OT recommends CIR.  TOC aware.     Palliative care consulted.  Procedures: Family communication :mother Stacy Pearson on the phone 2/24 Consults :ID nephrololgy, psychiatry CODE STATUS: DNR DVT Prophylaxis : heparin Level of care: Med-Surg Status is: Inpatient  Patient remains in house since  started on dialysis. She will need outpatient dialysis chair time. Discharge plan is likely to CIR once evaluated by staff. If cont to show improvement pt may go home. Cont to monitor      TOTAL TIME TAKING CARE OF THIS PATIENT: 25 minutes.  >50% time spent on counselling and coordination of care  Note: This dictation was prepared with Dragon dictation along with smaller phrase technology. Any transcriptional errors that result from this process are unintentional.  Stacy Pearson M.D    [facility]   CC: Primary care physician; Stacy Pearson, MDPatient ID: Stacy Pearson, female   DOB: 06-01-1961, 60 y.o.   MRN: [id number]

## 2020-09-08 NOTE — Progress Notes (Signed)
OT Cancellation Note  Patient Details Name: Stacy Pearson MRN: [id number] DOB: April 27, 1961   Cancelled Treatment:     Attempted to see patient 2 times this date, she was off the floor both times for dialysis. Will continue attempts for OT treatment sessions.  Amy T Lovett, OTR/L, CLT   Lovett,Amy 09/08/2020, 1:50 PM

## 2020-09-09 DIAGNOSIS — R410 Disorientation, unspecified: Secondary | ICD-10-CM | POA: Diagnosis not present

## 2020-09-09 LAB — BASIC METABOLIC PANEL
Anion gap: 14 (ref 5–15)
BUN: 20 mg/dL (ref 6–20)
CO2: 23 mmol/L (ref 22–32)
Calcium: 8.3 mg/dL — ABNORMAL LOW (ref 8.9–10.3)
Chloride: 94 mmol/L — ABNORMAL LOW (ref 98–111)
Creatinine, Ser: 4.1 mg/dL — ABNORMAL HIGH (ref 0.44–1.00)
GFR, Estimated: 12 mL/min — ABNORMAL LOW (ref >60)
Glucose, Bld: 72 mg/dL (ref 70–99)
Potassium: 3.3 mmol/L — ABNORMAL LOW (ref 3.5–5.1)
Sodium: 131 mmol/L — ABNORMAL LOW (ref 135–145)

## 2020-09-09 LAB — PHOSPHORUS: Phosphorus: 6.3 mg/dL — ABNORMAL HIGH (ref 2.5–4.6)

## 2020-09-09 MED ORDER — POTASSIUM CHLORIDE 20 MEQ PO PACK
20.0000 meq | PACK | Freq: Once | ORAL | Status: AC
  Administered 2020-09-09: 20 meq via ORAL
  Filled 2020-09-09: qty 1

## 2020-09-09 MED ORDER — SEVELAMER CARBONATE 800 MG PO TABS
800.0000 mg | ORAL_TABLET | Freq: Three times a day (TID) | ORAL | Status: DC
  Administered 2020-09-09 – 2020-09-10 (×5): 800 mg via ORAL
  Filled 2020-09-09 (×5): qty 1

## 2020-09-09 NOTE — TOC Progression Note (Signed)
Transition of Care [facility]) - Progression Note    Patient Details  Name: Stacy Pearson MRN: [id number] Date of Birth: [birth year]  Transition [facility]) CM/SW Contact  Truitt Merle, LCSW Phone Number: 09/09/2020, 12:38 PM  Clinical Narrative:    Pt continues to be assessed by CIR P/T for appropriateness. Per CIR note, CIR to f/u with patient and mom after 1 more therapy session to discuss progress, but expects patient will not need CIR when medically ready to discharge. Will await P/T recommendations HHPT vs. SNF. TOC continuing to follow for discharge needs.   Expected Discharge Plan: IP Rehab Facility Barriers to Discharge: Continued Medical Work up  Expected Discharge Plan and Services Expected Discharge Plan: [place]     Post Acute Care Choice: IP Rehab Living arrangements for the past 2 months: Single Family Home                                       Social Determinants of Health (SDOH) Interventions    Readmission Risk Interventions No flowsheet data found.

## 2020-09-09 NOTE — Progress Notes (Signed)
[place], [place] 09/09/20  Subjective:  [name] is a 60 y.o. white female with  bipolar, depression, anxiety, parkinson's, epilepsy, HTN, and COPD. She presents to the ED with AMS. Concern for overdose (cariprazine+alprazolam) versus urinary tract infection with sepsis.   Acute dialysis while inpatient  Patient seen today on first floor.  Patient offers no new physical complaints.  Patient main complaint continues to be that she wishes to go home .   Objective:  Vital signs in last 24 hours:  Temp:  [97.8 F (36.6 C)-99.4 F (37.4 C)] 98.8 F (37.1 C) (02/27 1128) Pulse Rate:  [76-91] 88 (02/27 1128) Resp:  [14-22] 16 (02/27 1128) BP: (96-136)/(56-83) 113/56 (02/27 1128) SpO2:  [94 %-98 %] 97 % (02/27 1128)  Weight change:  Filed Weights   08/28/20 0245 08/29/20 0337 09/04/20 1621  Weight: 66.8 kg 67.1 kg 67.1 kg    Intake/Output:    Intake/Output Summary (Last 24 hours) at 09/09/2020 1133 Last data filed at 09/09/2020 1010 Gross per 24 hour  Intake 0 ml  Output 1000 ml  Net -1000 ml    Physical Exam: General: NAD, sitting up in dialysis chair  Eyes: Anicteric  Lungs:  Clear to auscultation  Heart: Regular rate and rhythm  Abdomen:  Soft, nontender,   Extremities:  min peripheral edema.  Neurologic: Alert, able to answer questions  Skin: No lesions, dry  Access: Left IJ Permcath    Basic Metabolic Panel:  Recent Labs  Lab 09/03/20 2054 09/04/20 0509 09/05/20 0424 09/06/20 1522 09/09/20 0436  NA 140 142 144 138 131*  K 5.0 4.9 5.3* 3.9 3.3*  CL 100 101 104 98 94*  CO2 18* 22 20* 27 23  GLUCOSE 85 83 94 127* 72  BUN 72* 79* 99* 58* 20  CREATININE 9.14* 10.02* 11.79* 6.83* 4.10*  CALCIUM 8.9 9.1 8.6* 8.6* 8.3*  PHOS  --   --   --  7.4* 6.3*     CBC: Recent Labs  Lab 09/03/20 0529 09/03/20 2054 09/05/20 0424 09/06/20 1522  WBC 8.4 7.7 8.7 9.5  NEUTROABS  --  6.0  --   --   HGB 13.5 10.2* 9.9* 9.2*   HCT 42.8 31.3* 29.5* 28.1*  MCV 94.9 91.5 91.9 92.7  PLT 308 340 313 335      Lab Results  Component Value Date   HEPBSAG NON REACTIVE 08/29/2020   HEPBSAB NON REACTIVE 08/29/2020   HEPBIGM NON REACTIVE 08/29/2020      Microbiology:  Recent Results (from the past 240 hour(s))  Culture, blood (x 2)     Status: None   Collection Time: 09/03/20  8:54 PM   Specimen: BLOOD  Result Value Ref Range Status   Specimen Description BLOOD BLOOD RIGHT FOREARM  Final   Special Requests   Final    BOTTLES DRAWN AEROBIC AND ANAEROBIC Blood Culture adequate volume   Culture   Final    NO GROWTH 5 DAYS Performed at [facility], [address], [place], [place] [address]    Report Status 09/08/2020 FINAL  Final  Culture, blood (x 2)     Status: None   Collection Time: 09/03/20  8:56 PM   Specimen: BLOOD  Result Value Ref Range Status   Specimen Description BLOOD RIGHT ANTECUBITAL  Final   Special Requests   Final    BOTTLES DRAWN AEROBIC AND ANAEROBIC Blood Culture adequate volume   Culture   Final  NO GROWTH 5 DAYS Performed at [facility], [address] [place],  [address]    Report Status 09/08/2020 FINAL  Final    Coagulation Studies: No results for input(s): LABPROT, INR in the last 72 hours.  Urinalysis: No results for input(s): COLORURINE, LABSPEC, PHURINE, GLUCOSEU, HGBUR, BILIRUBINUR, KETONESUR, PROTEINUR, UROBILINOGEN, NITRITE, LEUKOCYTESUR in the last 72 hours.  Invalid input(s): APPERANCEUR    Imaging: No results found.   Medications:   . sodium chloride Stopped (09/05/20 2309)   . amLODipine  10 mg Oral Daily  . carbidopa-levodopa  1 tablet Oral TID  . Chlorhexidine Gluconate Cloth  6 each Topical Q0600  . vitamin B-12  1,000 mcg Oral Daily   Or  . cyanocobalamin  1,000 mcg Intramuscular Daily  . DULoxetine  60 mg Oral Daily  . epoetin (EPOGEN/PROCRIT) injection  4,000 Units Intravenous Q T,Th,Sa-HD  . feeding  supplement (NEPRO CARB STEADY)  237 mL Oral BID BM  . heparin injection (subcutaneous)  5,000 Units Subcutaneous Q8H  . hydrALAZINE  50 mg Oral BID  . Ipratropium-Albuterol  1 puff Inhalation Q6H  . lamoTRIgine  25 mg Oral BID  . multivitamin  1 tablet Oral QHS  . potassium chloride  20 mEq Oral Once  . propranolol ER  60 mg Oral Daily  . sevelamer carbonate  800 mg Oral TID WC   sodium chloride, acetaminophen **OR** acetaminophen, albuterol, hydrALAZINE, ondansetron (ZOFRAN) IV  Assessment/ Plan:  60 y.o. female with PMHX of bipolar, depression, anxiety, parkinson's, epilepsy, HTN, and COPD was admitted on 08/26/2020   Principal Problem:   Delirium Active Problems:   Bipolar 1 disorder (HCC)   COPD (chronic obstructive pulmonary disease) (HCC)   Essential tremor   Migraine without aura   Parkinson's disease ([place])   Epilepsy ([place])   Depression with anxiety   HTN (hypertension)   Acute metabolic encephalopathy   UTI (urinary tract infection)   AKI (acute kidney injury) ([place])   Severe sepsis with septic shock (HCC)  Confusion [R41.0] Severe sepsis with septic shock ([place]) [A41.9, R65.21] Severe sepsis ([place]) [A41.9, R65.20] Altered mental status, unspecified altered mental status type [R41.82] Sepsis, due to unspecified organism, unspecified whether acute organ dysfunction present ([place]) [A41.9]       1)Renal    Acute kidney injury AKI secondary to ATN Patient is requiring renal replacement therapy Patient currently requiring renal replacement therapy Patient third dialysis treatment was done on September 08, 2020 We will continue to follow patient's need for dialysis/volume status/creatinine/urine output   2)HTN    Blood pressure is stable    3)Anemia of chronic disease  CBC Latest Ref Rng & Units 09/06/2020 09/05/2020 09/03/2020  WBC 4.0 - 10.5 K/uL 9.5 8.7 7.7  Hemoglobin 12.0 - 15.0 g/dL 9.2(L) 9.9(L) 10.2(L)  Hematocrit 36.0 - 46.0 % 28.1(L) 29.5(L) 31.3(L)   Platelets 150 - 400 K/uL 335 313 340       HGb at goal (9--11) We will keep patient on Epogen protocol  4) Secondary hyperparathyroidism -CKD Mineral-Bone Disorder    Lab Results  Component Value Date   CALCIUM 8.3 (L) 09/09/2020   PHOS 6.3 (H) 09/09/2020    Secondary Hyperparathyroidism present Phosphorus is not at goal. We will start patient on binders  5) bipolar disorder with depression anxiety Primary team is following  6) Electrolytes   BMP Latest Ref Rng & Units 09/09/2020 09/06/2020 09/05/2020  Glucose 70 - 99 mg/dL 72 127(H) 94  BUN 6 - 20 mg/dL 20  58(H) 99(H)  Creatinine 0.44 - 1.00 mg/dL 4.10(H) 6.83(H) 11.79(H)  Sodium 135 - 145 mmol/L 131(L) 138 144  Potassium 3.5 - 5.1 mmol/L 3.3(L) 3.9 5.3(H)  Chloride 98 - 111 mmol/L 94(L) 98 104  CO2 22 - 32 mmol/L 23 27 20(L)  Calcium 8.9 - 10.3 mg/dL 8.3(L) 8.6(L) 8.6(L)     Sodium Hypokalemia Secondary to ESRD Inability to get into free water   Potassium Patient was hyperkalemic earlier now patient is hypokalemic We will replete gently    7)Acid base  Patient was acidotic earlier Now better  Co2 at goal    Plan  No need for renal placement therapy today. We will replete potassium gently We will start patient on binders   LOS: 14 Mohini Heathcock s [facility] 2/27/202211:33 AM  [place], [place]

## 2020-09-09 NOTE — Progress Notes (Signed)
[place] at [place] NAME: Stacy Pearson    MR#:  [id number]  DATE OF BIRTH:  30-Aug-1960  SUBJECTIVE:  patient continues to remain alert. Tolerating regular diet. Seen by speech yesterday. Worked with physical therapy. REVIEW OF SYSTEMS:   Review of Systems  Constitutional: Negative for chills, fever and weight loss.  HENT: Negative for ear discharge, ear pain and nosebleeds.   Eyes: Negative for blurred vision, pain and discharge.  Respiratory: Negative for sputum production, shortness of breath, wheezing and stridor.   Cardiovascular: Negative for chest pain, palpitations, orthopnea and PND.  Gastrointestinal: Negative for abdominal pain, diarrhea, nausea and vomiting.  Genitourinary: Negative for frequency and urgency.  Musculoskeletal: Negative for back pain and joint pain.  Neurological: Positive for weakness. Negative for sensory change, speech change and focal weakness.  Psychiatric/Behavioral: Negative for depression and hallucinations. The patient is not nervous/anxious.    Tolerating Diet:yes Tolerating PT: CIR vs HHPT  DRUG ALLERGIES:   Allergies  Allergen Reactions  . Codeine Nausea And Vomiting  . Prednisone     VITALS:  Blood pressure (!) 113/56, pulse 88, temperature 98.8 F (37.1 C), resp. rate 16, height 5\' 7"  (1.702 m), weight 67.1 kg, SpO2 97 %.  PHYSICAL EXAMINATION:   Physical Examlimited  GENERAL:  60 y.o.-year-old patient lying in the bed with no acute distress.  LUNGS: Normal breath sounds bilaterally, no wheezing, rales, rhonchi. No use of accessory muscles of respiration. permcath + CARDIOVASCULAR: S1, S2 normal. No murmurs, rubs, or gallops.  ABDOMEN: Soft, nontender, nondistended. Bowel sounds present. No organomegaly or mass.  EXTREMITIES: No cyanosis, clubbing or edema b/l.    NEUROLOGIC: no focal deficit moves all extremities spontaneously. PSYCHIATRIC:  patient is alert and awake. No focal  deficit.  SKIN: No obvious rash, lesion, or ulcer.   LABORATORY PANEL:  CBC Recent Labs  Lab 09/06/20 1522  WBC 9.5  HGB 9.2*  HCT 28.1*  PLT 335    Chemistries  Recent Labs  Lab 09/03/20 2054 09/04/20 0509 09/09/20 0436  NA 140   < > 131*  K 5.0   < > 3.3*  CL 100   < > 94*  CO2 18*   < > 23  GLUCOSE 85   < > 72  BUN 72*   < > 20  CREATININE 9.14*   < > 4.10*  CALCIUM 8.9   < > 8.3*  AST 11*  --   --   ALT <5  --   --   ALKPHOS 72  --   --   BILITOT 1.2  --   --    < > = values in this interval not displayed.   Cardiac Enzymes No results for input(s): TROPONINI in the last 168 hours. RADIOLOGY:  No results found. ASSESSMENT AND PLAN:  Stacy Barman [name] a 60 y.o.femalewith medical history significant ofbipolar, depression, anxiety, Parkinson, epilepsy, HTN, COPD, presents with AMS. On presentation patient was confused and agitated.  Admitting provider was able to contact her mother who is the only contact listed, and according to her she lives alone and was at her normal state of health until Friday afternoon. She was sleeping most of the time since then and found to be more confused and somnolent and EMS was called.  Acute Metabolic encephalopathy   --Present on admission suspected due to polypharmacy and possible overdose --Significant improvement in her mental status  ---2/22--Patient had a T-max of 100.9 this a.m.  with tachycardia concerning for development of infection. Empirically on vancomycin  --2/23--Patient is more awake today. Tells me her name. Says hello, good morning and repeats a few words. Tells me she is hungry --Antibiotics discontinued per ID as urine, blood and CSF culture remain negative --2/24--very talkative and appears near baseline. Had temp 99 last pm.BC from 2/21--negative . On IV cefepime--d/w pharmacy to see if it can be d/ced  --2/25-- remains afebrile. Not on any antibiotic. Continues to improve with swallowing now on regular  diet. Worked with physical therapy and occupational therapy. CIR staff to come evaluate today. --2/26--cont to do well. She was evaluated by CIR staff --2/27-- CIR staff would like to see how patient does with one more PT session and then determine whether she qualifies for it  AKI due to ATN -dialysis per nephrology.   --s/- Permacath  per vascular surgery and on  Dialysis --pt will need to sit in chair today for HD.  --needs out pt chair time. HD coordinator aware  Acute hypoxic respiratory failure Now resolved --pt on RA  Hyperkalemia.   k 5.3--should improve with HD  Bipolar 1 disorder with depression and anxiety.  --2/21--Psychiatry signed off.  --2/24--dr clapacs re-consulted to review psych meds --2/25-- per Dr. Weber Cooks continue to monitor. Holding psych meds for now.  COPD.  Appears stable, no wheezing and chest x-ray without any acute abnormality. -As needed bronchodilators  History of Parkinson's disease.   Chronic tremors -2/21--Neurology changing Sinemet 1 tablet p.o. 3 times daily   Hypertension. -On home propranolol. -Hydralazine and amlodipine.  Generalized weakness PT, OT recommends CIR.  TOC aware.     Palliative care consulted.  Procedures: Family communication :mother Stacy Pearson on the phone  Consults :ID nephrololgy, psychiatry CODE STATUS: DNR DVT Prophylaxis : heparin Level of care: Med-Surg Status is: Inpatient  Patient remains in house since  started on dialysis. She will need outpatient dialysis chair time. Awaiting final discharge plan whether patient will go to CIR versus home health.     TOTAL TIME TAKING CARE OF THIS PATIENT: 20 minutes.  >50% time spent on counselling and coordination of care  Note: This dictation was prepared with Dragon dictation along with smaller phrase technology. Any transcriptional errors that result from this process are unintentional.  Fritzi Mandes M.D    [facility]   CC: Primary care  physician; Valera Castle, MDPatient ID: Stacy Pearson, female   DOB: 11/26/1960, 60 y.o.   MRN: [id number]

## 2020-09-10 ENCOUNTER — Other Ambulatory Visit: Payer: Medicare Other | Admitting: Nurse Practitioner

## 2020-09-10 LAB — CBC
HCT: 23.6 % — ABNORMAL LOW (ref 36.0–46.0)
Hemoglobin: 7.8 g/dL — ABNORMAL LOW (ref 12.0–15.0)
MCH: 30.2 pg (ref 26.0–34.0)
MCHC: 33.1 g/dL (ref 30.0–36.0)
MCV: 91.5 fL (ref 80.0–100.0)
Platelets: 353 10*3/uL (ref 150–400)
RBC: 2.58 MIL/uL — ABNORMAL LOW (ref 3.87–5.11)
RDW: 14 % (ref 11.5–15.5)
WBC: 5.9 10*3/uL (ref 4.0–10.5)
nRBC: 0 % (ref 0.0–0.2)

## 2020-09-10 MED ORDER — RENA-VITE PO TABS: 1.0000 | ORAL_TABLET | Freq: Every day | ORAL | 0 refills | Status: DC

## 2020-09-10 MED ORDER — AMLODIPINE BESYLATE 10 MG PO TABS: 10.0000 mg | ORAL_TABLET | Freq: Every day | ORAL | 1 refills | Status: AC

## 2020-09-10 MED ORDER — CYANOCOBALAMIN 1000 MCG PO TABS: 1000.0000 ug | ORAL_TABLET | Freq: Every day | ORAL | 0 refills | Status: DC

## 2020-09-10 MED ORDER — SEVELAMER CARBONATE 800 MG PO TABS: 800.0000 mg | ORAL_TABLET | Freq: Three times a day (TID) | ORAL | 2 refills | Status: DC

## 2020-09-10 MED ORDER — CARBIDOPA-LEVODOPA 25-100 MG PO TABS: 1.0000 | ORAL_TABLET | Freq: Three times a day (TID) | ORAL | 1 refills | Status: AC

## 2020-09-10 MED ORDER — HYDRALAZINE HCL 50 MG PO TABS: 50.0000 mg | ORAL_TABLET | Freq: Two times a day (BID) | ORAL | 2 refills | Status: DC

## 2020-09-10 NOTE — Progress Notes (Addendum)
Patient accepted at [facility] 7:30. Can start tomorrow, Tuesday 3/1 at 7:15am.

## 2020-09-10 NOTE — Progress Notes (Addendum)
Occupational Therapy Treatment Patient Details Name: Stacy Pearson MRN: [id number] DOB: 09-10-1960 Today's Date: 09/10/2020    History of present illness Pt admitted to 08/26/20 for OD (cariprazine and alprazolam) vs. UTI with sepsis. On presentation, pt was found to have neutrophilic predominant leukocytosis, lactic acidosis at 6.7, AKI, tachycardic, and tachypneic. CXR with low lung volumes and no infiltrate; HCT negative. RR called on 2/16. PMH significant for: bipolar, depression, anxiety, PD, epilepsy, HTN, and COPD.   OT comments  Pt seen for OT treatment on this date. Upon arrival to room, pt awake in bed and agreeable to OT session, with pt's mother arriving during session. Pt able to complete all dressing, functional mobility, toilet transfer, and standing grooming with SUPERVISION. Pt with significant decrease in b/l UE edema this date and able to fasten 3/4 buttons independently. Additionally, pt demonstrates improved mentation this date, with pt able to recall OT from 3 days ago and able to recall education provided regarding energy conservation strategies and safety during functional mobility at end of session. Pt with improved safety awareness, independently stating that she is not safe to drive right now.   Pt is making good progress toward goals. Goals, discharge plan, and frequency have been updated. Pt would benefit from continued inpatient OT services to improve fine motor skills, maximize independence and safety with ADLs/IADLs, and maximize recall and carryover of learned techniques and facilitate implementation of learned techniques into daily routines. Dicharge recommendation: home with home OT and supervision/assistance with OOB mobility.     Follow Up Recommendations  Home health OT;Supervision - Intermittent    Equipment Recommendations  Other (comment) (RW)       Precautions / Restrictions Precautions Precautions: Fall Precaution Comments:  DNR Restrictions Weight Bearing Restrictions: No       Mobility Bed Mobility Overal bed mobility: Modified Independent Bed Mobility: Supine to Sit                Transfers Overall transfer level: Needs assistance Equipment used: None Transfers: Sit to/from Stand Sit to Stand: Supervision Stand pivot transfers: Supervision            Balance Overall balance assessment: Needs assistance   Sitting balance-Leahy Scale: Good Sitting balance - Comments: Able to reach beyond BOS for clothing during UB/LB dressing with no LOB observed     Standing balance-Leahy Scale: Good Standing balance comment: Able to walk to bathroom and stand sink-side for grooming with no AD, requiring increased time only                           ADL either performed or assessed with clinical judgement   ADL Overall ADL's : Needs assistance/impaired     Grooming: Wash/dry hands;Standing;Oral care;Sitting;Supervision/safety           Upper Body Dressing : Sitting;Supervision/safety;Set up Upper Body Dressing Details (indicate cue type and reason): To don overhead shirt, with pt able to fasten 3/4 buttons independently Lower Body Dressing: Supervision/safety;Sit to/from stand Lower Body Dressing Details (indicate cue type and reason): To don underwear and pants sit to/from stand and to don socks and shoes while seated Toilet Transfer: Supervision/safety;Ambulation;Regular Toilet   Toileting- Water quality scientist and Hygiene: Supervision/safety;Sit to/from stand Toileting - Clothing Manipulation Details (indicate cue type and reason): Able to don/doff pants/underwear     Functional mobility during ADLs: Supervision/safety                 Cognition Arousal/Alertness: Awake/alert  Behavior During Therapy: Restless Overall Cognitive Status: Impaired/Different from baseline                                 General Comments: Improved mentation, with pt able  to recall OT from 3 days ago. Good recall of education provided regarding energy conservation strategies and safety during functional mobility at end of session. Pt with improved safety awareness, independently stating that she is not safe to drive right now        Exercises Other Exercises Other Exercises: Pt and family educated on energy conservation strategies including activity pacing, home/routines modifications, AE/DME, prioritizing of meaningful occupations, and falls prevention.       General Comments Significant decrease in b/l UE edema    Pertinent Vitals/ Pain       Pain Assessment: No/denies pain         Frequency  Min 1X/week        Progress Toward Goals  OT Goals(current goals can now be found in the care plan section)  Progress towards OT goals: Progressing toward goals  Acute Rehab OT Goals Patient Stated Goal: to go home OT Goal Formulation: With patient/family Time For Goal Achievement: 09/15/20 Potential to Achieve Goals: Good ADL Goals Pt Will Perform Grooming: Independently;standing Pt Will Transfer to Toilet: Independently;ambulating;regular height toilet  Plan Discharge plan needs to be updated;Frequency needs to be updated       AM-PAC OT "6 Clicks" Daily Activity     Outcome Measure   Help from another person eating meals?: None Help from another person taking care of personal grooming?: A Little Help from another person toileting, which includes using toliet, bedpan, or urinal?: A Little Help from another person bathing (including washing, rinsing, drying)?: A Little Help from another person to put on and taking off regular upper body clothing?: A Little Help from another person to put on and taking off regular lower body clothing?: A Little 6 Click Score: 19    End of Session Equipment Utilized During Treatment: Rolling walker;Gait belt  OT Visit Diagnosis: Other abnormalities of gait and mobility (R26.89);Muscle weakness (generalized)  (M62.81)   Activity Tolerance Patient tolerated treatment well   Patient Left in chair;with chair alarm set;with call bell/phone within reach;with family/visitor present   Nurse Communication Mobility status        Time: 0321-2248 OT Time Calculation (min): 29 min  Charges: OT General Charges $OT Visit: 1 Visit OT Treatments $Self Care/Home Management : 23-37 mins  Fredirick Maudlin, OTR/L [place]

## 2020-09-10 NOTE — Discharge Instructions (Signed)
Resume your hemodialysis Tuesday Thursday Saturday in [place] at 7:30 AM

## 2020-09-10 NOTE — Discharge Summary (Signed)
[place] [facility] [place] NAME: Stacy Pearson    MR#:  [id number]  DATE OF BIRTH:  1960-08-03  DATE OF ADMISSION:  08/26/2020 ADMITTING PHYSICIAN: Ivor Costa, MD  DATE OF DISCHARGE: 09/10/2020  PRIMARY CARE PHYSICIAN: Valera Castle, MD    ADMISSION DIAGNOSIS:  Confusion [R41.0] Severe sepsis with septic shock ([place]) [A41.9, R65.21] Severe sepsis ([place]) [A41.9, R65.20] Altered mental status, unspecified altered mental status type [R41.82] Sepsis, due to unspecified organism, unspecified whether acute organ dysfunction present ([place]) [A41.9]  DISCHARGE DIAGNOSIS:  acute metabolic encephalopathy exact etiology unclear resolved acute kidney injury due to ATN now on hemodialysis acute respiratory failure hypoxic resolved hyperkalemia resolved history of bipolar disorder history of Parkinson's disease  SECONDARY DIAGNOSIS:   Past Medical History:  Diagnosis Date  . Abdominal pain   . Allergic genetic state   . Anxiety   . Asthma   . Basal cell carcinoma   . Bipolar disorder ([place])   . Cancer ([place])    MOS surgery basal cell face  . COPD (chronic obstructive pulmonary disease) ([place])   . Depression   . Eating disorder   . Eczema   . Encounter for blood transfusion   . Essential tremor   . 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 GERD (gastroesophageal reflux disease)   . Headache   . History of basal cell carcinoma   . Hypertension   . Neuropathy   . Parkinson's disease ([place])   . Parkinson's disease ([place])   . Parkinsonism [facility])     HOSPITAL COURSE:   Stacy Higginbotham [name] a 60 y.o.femalewith medical history significant ofbipolar, depression, anxiety, Parkinson, epilepsy, HTN, COPD, presents with AMS. On presentation patient was confused and agitated. Admitting provider was able to contact her mother who is the only contact listed, and according to her she lives alone and was at her normal state  of health until Friday afternoon. She was sleeping most of the time since then and found to be more confused and somnolent and EMS was called.  Acute Metabolic encephalopathy  --Present on admission suspected due to polypharmacy and possible overdose --Significant improvement in her mental status  ---2/22--Patient had a T-max of 100.9 this a.m. with tachycardia concerning for development of infection. Empirically on vancomycin  --2/23--Patient is more awake today. Tells me her name. Says hello, good morning and repeats a few words. Tells me she is hungry --Antibiotics discontinued per ID as urine, blood and CSF culture remain negative --2/24--very talkative and appears near baseline. Had temp 99 last pm.BC from 2/21--negative . On IV cefepime--d/w pharmacy to see if it can be d/ced  --2/25-- remains afebrile. Not on any antibiotic. Continues to improve with swallowing now on regular diet. Worked with physical therapy and occupational therapy. CIR staff to come evaluate today. --2/26--cont to do well. She was evaluated by CIR staff --2/27-- CIR staff would like to see how patient does with one more PT session and then determine whether she qualifies for it --2/28-- evaluated by physical therapy occupational therapy. Patient doing well okay to go home with home health. TOC to help with DME and home health set up  AKI due to ATN -dialysis per nephrology. --s/-Permacath  per vascular surgery and on  Dialysis -- per Dr. Holley Raring patient has dialysis outpatient in [facility] Tuesday Thursday Saturday at 7:30 AM  Acute hypoxic respiratory failure Now resolved --pt on RA  Hyperkalemia.  k 5.3--should improve with HD  Bipolar 1 disorder with depression and anxiety. --2/21--Psychiatry signed off.  --2/24--dr clapacs re-consulted to review psych meds --2/25-- per Dr. Weber Cooks continue to monitor.  --2/28-- continues to remain stable. Doing well with current meds.  COPD.Appears stable,  no wheezing and chest x-ray without any acute abnormality. -As needed bronchodilators  History of Parkinson's disease. Chronic tremors -2/21--Neurology changing Sinemet 1 tablet p.o. 3 times daily  Hypertension. -On home propranolol. -Hydralazine and amlodipine.  Generalized weakness improved remarkably. Patient will discharged to home with home health. Mother aware.   Palliative care consulted.  Procedures: Family communication :mother Adonis Huguenin on the phone today Consults :ID nephrololgy, psychiatry CODE STATUS: DNR DVT Prophylaxis : heparin Level of care: Med-Surg   patient will discharged to home with home health today. CONSULTS OBTAINED:  Treatment Team:  Clapacs, Madie Reno, MD  DRUG ALLERGIES:   Allergies  Allergen Reactions  . Codeine Nausea And Vomiting  . Prednisone     DISCHARGE MEDICATIONS:   Allergies as of 09/10/2020      Reactions   Codeine Nausea And Vomiting   Prednisone       Medication List    STOP taking these medications   amitriptyline 50 MG tablet Commonly known as: ELAVIL   Cariprazine HCl 6 MG Caps   diazepam 10 MG tablet Commonly known as: VALIUM     TAKE these medications   amLODipine 10 MG tablet Commonly known as: NORVASC Take 1 tablet (10 mg total) by mouth daily. Start taking on: September 11, 2020   carbidopa-levodopa 25-100 MG tablet Commonly known as: SINEMET IR Take 1 tablet by mouth 3 (three) times daily. What changed:   how much to take  additional instructions  Another medication with the same name was removed. Continue taking this medication, and follow the directions you see here.   cyanocobalamin 1000 MCG tablet Take 1 tablet (1,000 mcg total) by mouth daily. Start taking on: September 11, 2020   DULoxetine 60 MG capsule Commonly known as: CYMBALTA Take by mouth.   EPINEPHrine 0.3 mg/0.3 mL Soaj injection Commonly known as: EPI-PEN Inject into the muscle once.   hydrALAZINE 50 MG tablet Commonly  known as: APRESOLINE Take 1 tablet (50 mg total) by mouth 2 (two) times daily.   Ipratropium-Albuterol 20-100 MCG/ACT Aers respimat Commonly known as: COMBIVENT Inhale 1 puff into the lungs every 6 (six) hours.   lamoTRIgine 25 MG tablet Commonly known as: LAMICTAL Take 50 mg by mouth at bedtime.   multivitamin Tabs tablet Take 1 tablet by mouth at bedtime.   ondansetron 8 MG tablet Commonly known as: ZOFRAN Take by mouth every 8 (eight) hours as needed for nausea or vomiting.   propranolol ER 60 MG 24 hr capsule Commonly known as: INDERAL LA Take 60 mg by mouth daily.   sevelamer carbonate 800 MG tablet Commonly known as: RENVELA Take 1 tablet (800 mg total) by mouth 3 (three) times daily with meals.       If you experience worsening of your admission symptoms, develop shortness of breath, life threatening emergency, suicidal or homicidal thoughts you must seek medical attention immediately by calling 911 or calling your MD immediately  if symptoms less severe.  You Must read complete instructions/literature along with all the possible adverse reactions/side effects for all the Medicines you take and that have been prescribed to you. Take any new Medicines after you have completely understood and accept all the possible adverse reactions/side effects.   Please note  You were cared  for by a hospitalist during your hospital stay. If you have any questions about your discharge medications or the care you received while you were in the hospital after you are discharged, you can call the unit and asked to speak with the hospitalist on call if the hospitalist that took care of you is not available. Once you are discharged, your primary care physician will handle any further medical issues. Please note that NO REFILLS for any discharge medications will be authorized once you are discharged, as it is imperative that you return to your primary care physician (or establish a relationship  with a primary care physician if you do not have one) for your aftercare needs so that they can reassess your need for medications and monitor your lab values. Today   SUBJECTIVE  well when can I go home I'm missing my dogs!   VITAL SIGNS:  Blood pressure 120/69, pulse 74, temperature 98.8 F (37.1 C), resp. rate 16, height 5\' 7"  (1.702 m), weight 67.1 kg, SpO2 97 %.  I/O:    Intake/Output Summary (Last 24 hours) at 09/10/2020 1401 Last data filed at 09/10/2020 1011 Gross per 24 hour  Intake 240 ml  Output -  Net 240 ml    PHYSICAL EXAMINATION:  GENERAL:  60 y.o.-year-old patient lying in the bed with no acute distress.  LUNGS: Normal breath sounds bilaterally, no wheezing, rales,rhonchi or crepitation. No use of accessory muscles of respiration. Perm cath+ CARDIOVASCULAR: S1, S2 normal. No murmurs, rubs, or gallops.  ABDOMEN: Soft, non-tender, non-distended. Bowel sounds present. No organomegaly or mass.  EXTREMITIES: No pedal edema, cyanosis, or clubbing.  NEUROLOGIC: Cranial nerves II through XII are intact. Muscle strength 5/5 in all extremities. Sensation intact. Gait not checked.  PSYCHIATRIC: The patient is alert and oriented x 3.  SKIN: No obvious rash, lesion, or ulcer.   DATA REVIEW:   CBC  Recent Labs  Lab 09/10/20 0410  WBC 5.9  HGB 7.8*  HCT 23.6*  PLT 353    Chemistries  Recent Labs  Lab 09/03/20 2054 09/04/20 0509 09/09/20 0436  NA 140   < > 131*  K 5.0   < > 3.3*  CL 100   < > 94*  CO2 18*   < > 23  GLUCOSE 85   < > 72  BUN 72*   < > 20  CREATININE 9.14*   < > 4.10*  CALCIUM 8.9   < > 8.3*  AST 11*  --   --   ALT <5  --   --   ALKPHOS 72  --   --   BILITOT 1.2  --   --    < > = values in this interval not displayed.    Microbiology Results   Recent Results (from the past 240 hour(s))  Culture, blood (x 2)     Status: None   Collection Time: 09/03/20  8:54 PM   Specimen: BLOOD  Result Value Ref Range Status   Specimen Description  BLOOD BLOOD RIGHT FOREARM  Final   Special Requests   Final    BOTTLES DRAWN AEROBIC AND ANAEROBIC Blood Culture adequate volume   Culture   Final    NO GROWTH 5 DAYS Performed at [facility], [address] [place], [place] [address]    Report Status 09/08/2020 FINAL  Final  Culture, blood (x 2)     Status: None   Collection Time: 09/03/20  8:56 PM   Specimen: BLOOD  Result Value Ref Range Status   Specimen Description BLOOD RIGHT ANTECUBITAL  Final   Special Requests   Final    BOTTLES DRAWN AEROBIC AND ANAEROBIC Blood Culture adequate volume   Culture   Final    NO GROWTH 5 DAYS Performed at [facility], [address], [place], [place] [address]    Report Status 09/08/2020 FINAL  Final    RADIOLOGY:  No results found.   CODE STATUS:     Code Status Orders  (From admission, onward)         Start     Ordered   08/26/20 0820  Do not attempt resuscitation (DNR)  Continuous        08/26/20 0819        Code Status History    Date Active Date Inactive Code Status Order ID Comments User Context   08/26/2020 0803 08/26/2020 0819 Full Code [id number]  Ivor Costa, MD ED   Advance Care Planning Activity       TOTAL TIME TAKING CARE OF THIS PATIENT: *40* minutes.    Fritzi Mandes M.D  [facility]    CC: Primary care physician; Kym Groom Guy Begin, MD

## 2020-09-10 NOTE — TOC Progression Note (Signed)
Transition of Care [facility]) - Progression Note    Patient Details  Name: Stacy Pearson MRN: [id number] Date of Birth: 1960/11/03  Transition of Care [facility]) CM/SW [place], LCSW Phone Number: 09/10/2020, 2:21 PM  Clinical Narrative: Met with patient and her mother to discuss home health recommendations. They are agreeable. Reviewed CMS scores. First preference is [place]. Representative will call back.    Expected Discharge Plan: IP Rehab Facility Barriers to Discharge: Continued Medical Work up  Expected Discharge Plan and Services Expected Discharge Plan: [place]     Post Acute Care Choice: IP Rehab Living arrangements for the past 2 months: Single Family Home Expected Discharge Date: 09/10/20                                     Social Determinants of Health (SDOH) Interventions    Readmission Risk Interventions No flowsheet data found.

## 2020-09-10 NOTE — Progress Notes (Signed)
[place], [place] 09/10/20  Subjective:  [name] is a 60 y.o. white female with  bipolar, depression, anxiety, parkinson's, epilepsy, HTN, and COPD. She presents to the ED with AMS. Concern for overdose (cariprazine+alprazolam) versus urinary tract infection with sepsis.   Acute dialysis while inpatient  Patient seen resting in bed Alert and able to answer questions States she is ready to go home Able to tolerate meals Denies nausea Denies shortness of breath and chest pain States she is voiding regularly   Objective:  Vital signs in last 24 hours:  Temp:  [98.3 F (36.8 C)-98.8 F (37.1 C)] 98.8 F (37.1 C) (02/28 1121) Pulse Rate:  [64-74] 74 (02/28 1121) Resp:  [16-17] 16 (02/28 0957) BP: (99-126)/(58-69) 120/69 (02/28 1121) SpO2:  [97 %-99 %] 97 % (02/28 1121)  Weight change:  Filed Weights   08/28/20 0245 08/29/20 0337 09/04/20 1621  Weight: 66.8 kg 67.1 kg 67.1 kg    Intake/Output:    Intake/Output Summary (Last 24 hours) at 09/10/2020 1334 Last data filed at 09/10/2020 1011 Gross per 24 hour  Intake 240 ml  Output -  Net 240 ml    Physical Exam: General: NAD, laying in bed  Eyes: Anicteric  Lungs:  Clear to auscultation  Heart: Regular rate and rhythm  Abdomen:  Soft, nontender,   Extremities:  min peripheral edema.  Neurologic: Alert, able to answer questions  Skin: No lesions, dry  Access: Left IJ Permcath    Basic Metabolic Panel:  Recent Labs  Lab 09/03/20 2054 09/04/20 0509 09/05/20 0424 09/06/20 1522 09/09/20 0436  NA 140 142 144 138 131*  K 5.0 4.9 5.3* 3.9 3.3*  CL 100 101 104 98 94*  CO2 18* 22 20* 27 23  GLUCOSE 85 83 94 127* 72  BUN 72* 79* 99* 58* 20  CREATININE 9.14* 10.02* 11.79* 6.83* 4.10*  CALCIUM 8.9 9.1 8.6* 8.6* 8.3*  PHOS  --   --   --  7.4* 6.3*     CBC: Recent Labs  Lab 09/03/20 2054 09/05/20 0424 09/06/20 1522 09/10/20 0410  WBC 7.7 8.7 9.5 5.9  NEUTROABS 6.0   --   --   --   HGB 10.2* 9.9* 9.2* 7.8*  HCT 31.3* 29.5* 28.1* 23.6*  MCV 91.5 91.9 92.7 91.5  PLT 340 313 335 353      Lab Results  Component Value Date   HEPBSAG NON REACTIVE 08/29/2020   HEPBSAB NON REACTIVE 08/29/2020   HEPBIGM NON REACTIVE 08/29/2020      Microbiology:  Recent Results (from the past 240 hour(s))  Culture, blood (x 2)     Status: None   Collection Time: 09/03/20  8:54 PM   Specimen: BLOOD  Result Value Ref Range Status   Specimen Description BLOOD BLOOD RIGHT FOREARM  Final   Special Requests   Final    BOTTLES DRAWN AEROBIC AND ANAEROBIC Blood Culture adequate volume   Culture   Final    NO GROWTH 5 DAYS Performed at [facility], [address] [place], [place] [address]    Report Status 09/08/2020 FINAL  Final  Culture, blood (x 2)     Status: None   Collection Time: 09/03/20  8:56 PM   Specimen: BLOOD  Result Value Ref Range Status   Specimen Description BLOOD RIGHT ANTECUBITAL  Final   Special Requests   Final    BOTTLES DRAWN AEROBIC AND ANAEROBIC Blood Culture adequate volume   Culture  Final    NO GROWTH 5 DAYS Performed at [facility], [address] [place], [place] [address]    Report Status 09/08/2020 FINAL  Final    Coagulation Studies: No results for input(s): LABPROT, INR in the last 72 hours.  Urinalysis: No results for input(s): COLORURINE, LABSPEC, PHURINE, GLUCOSEU, HGBUR, BILIRUBINUR, KETONESUR, PROTEINUR, UROBILINOGEN, NITRITE, LEUKOCYTESUR in the last 72 hours.  Invalid input(s): APPERANCEUR    Imaging: No results found.   Medications:   . sodium chloride Stopped (09/05/20 2309)   . amLODipine  10 mg Oral Daily  . carbidopa-levodopa  1 tablet Oral TID  . Chlorhexidine Gluconate Cloth  6 each Topical Q0600  . vitamin B-12  1,000 mcg Oral Daily   Or  . cyanocobalamin  1,000 mcg Intramuscular Daily  . DULoxetine  60 mg Oral Daily  . epoetin (EPOGEN/PROCRIT) injection  4,000 Units  Intravenous Q T,Th,Sa-HD  . feeding supplement (NEPRO CARB STEADY)  237 mL Oral BID BM  . heparin injection (subcutaneous)  5,000 Units Subcutaneous Q8H  . hydrALAZINE  50 mg Oral BID  . Ipratropium-Albuterol  1 puff Inhalation Q6H  . lamoTRIgine  25 mg Oral BID  . multivitamin  1 tablet Oral QHS  . propranolol ER  60 mg Oral Daily  . sevelamer carbonate  800 mg Oral TID WC   sodium chloride, acetaminophen **OR** acetaminophen, albuterol, hydrALAZINE, ondansetron (ZOFRAN) IV  Assessment/ Plan:  60 y.o. female with PMHX of bipolar, depression, anxiety, parkinson's, epilepsy, HTN, and COPD was admitted on 08/26/2020   Principal Problem:   Delirium Active Problems:   Bipolar 1 disorder (HCC)   COPD (chronic obstructive pulmonary disease) (HCC)   Essential tremor   Migraine without aura   Parkinson's disease ([place])   Epilepsy ([place])   Depression with anxiety   HTN (hypertension)   Acute metabolic encephalopathy   UTI (urinary tract infection)   AKI (acute kidney injury) ([place])   Severe sepsis with septic shock (HCC)  Confusion [R41.0] Severe sepsis with septic shock ([place]) [A41.9, R65.21] Severe sepsis ([place]) [A41.9, R65.20] Altered mental status, unspecified altered mental status type [R41.82] Sepsis, due to unspecified organism, unspecified whether acute organ dysfunction present ([place]) [A41.9]  #. Acute kidney injury Acute kidney injury likely secondary to ATN Baseline creatinine on January 31, 2020 of 0.8  Received dialysis last on Saturday  02/27 0701 - 02/28 0700 In: 240 [P.O.:240] Out: 0    Lab Results  Component Value Date   CREATININE 4.10 (H) 09/09/2020   CREATININE 6.83 (H) 09/06/2020   CREATININE 11.79 (H) 09/05/2020   Not offered placement at [facility] CIR Outpatient treatment set up at [facility] Next treatment will be tomorrow    #Anemia of chronic kidney disease Epo 4000 units ordered with treatments    LOS: [address] 2/28/20221:34  PM  [facility] [place]

## 2020-09-10 NOTE — TOC Transition Note (Signed)
Transition of Care [facility]) - CM/SW Discharge Note   Patient Details  Name: Stacy Pearson MRN: [id number] Date of Birth: 06/22/1961  Transition of [facility]) CM/SW Contact:  Candie Chroman, LCSW Phone Number: 09/10/2020, 3:24 PM   Clinical Narrative:  [facility] is able to accept patient for PT, OT, aide, SW. They do not have a nurse available at this time. Patient and her mother are aware and agreeable. Patient and her mother are requesting assistance with hospital bill. 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 Sent secure email to financial counselor to notify. No further concerns. CSW signing off.   Final next level of care: [place] Barriers to Discharge: Barriers Resolved   Patient Goals and CMS Choice     Choice offered to / list presented to : [place]  Discharge Placement                Patient to be transferred to facility by: Mother will take her home Name of family member notified: Jetta Lout Patient and family notified of of transfer: 09/10/20  Discharge Plan and Services     Post Acute Care Choice: IP Rehab                    HH Arranged: PT,OT,Nurse's [place] Work [employer] Agency: [place] Date [facility] Agency Contacted: 09/10/20   Representative spoke with at [place]: Adela Lank  Social Determinants of Health ([place]) Interventions     Readmission Risk Interventions No flowsheet data found.

## 2020-09-10 NOTE — Progress Notes (Signed)
Physical Therapy Treatment Patient Details Name: Stacy Pearson MRN: [id number] DOB: September 07, 1960 Today's Date: 09/10/2020    History of Present Illness Pt admitted to 08/26/20 for OD (cariprazine and alprazolam) vs. UTI with sepsis. On presentation, pt was found to have neutrophilic predominant leukocytosis, lactic acidosis at 6.7, AKI, tachycardic, and tachypneic. CXR with low lung volumes and no infiltrate; HCT negative. RR called on 2/16. PMH significant for: bipolar, depression, anxiety, PD, epilepsy, HTN, and COPD.    PT Comments    Patient alert, very eager for PT. Patient exhibited significant improvement in UE movement/strength/coordination bilaterally as well as improved balance this session as well. Sit <> Stand from recliner with supervision. Ambulated ~290ft with CGA-progressed to supervision. Improved pt confidence noted with bilateral UE support, agreeable to utilize her cane at home and for further follow up of RW needs with HHPT (family agreeable as well). Pt was able to ascend/descend stairs with single railing support and CGA. Did have mild unsteadiness noted with alternating step pattern, but able to correct with railing. Family educated on safety with stairs as well. The patient would benefit from further skilled PT intervention to continue to progress towards goals. Recommendation changed to HHPT with intermittent supervision and supervision for OOB/mobility due to improvements this session.      Follow Up Recommendations  Supervision for mobility/OOB;Supervision - Intermittent;Home health PT     Equipment Recommendations  None recommended by PT (TBD at next level of care)    Recommendations for Other Services Rehab consult     Precautions / Restrictions Precautions Precautions: Fall Precaution Comments: DNR Restrictions Weight Bearing Restrictions: No    Mobility  Bed Mobility Overal bed mobility: Modified Independent Bed Mobility: Supine to Sit            General bed mobility comments: pt up in chair at start/end of session    Transfers Overall transfer level: Needs assistance Equipment used: None Transfers: Sit to/from Stand Sit to Stand: Supervision Stand pivot transfers: Supervision          Ambulation/Gait Ambulation/Gait assistance: Min guard;Supervision Gait Distance (Feet): 200 Feet Assistive device: None   Gait velocity: decreased   General Gait Details: Pt with significant improvement in balance; a ble to ambulate with CGA, progressed to supervision. No LOB noted   Stairs Stairs: Yes Stairs assistance: Min guard Stair Management: Alternating pattern;One rail Left;One rail Right Number of Stairs: 7 General stair comments: cued for use of handrail, some mild unsteadiness noted due to leg fatigue but able to correct with railing and CGA   Wheelchair Mobility    Modified Rankin (Stroke Patients Only)       Balance Overall balance assessment: Needs assistance Sitting-balance support: Feet unsupported Sitting balance-Leahy Scale: Good Sitting balance - Comments: no LOB, able to move UEs freely   Standing balance support: No upper extremity supported Standing balance-Leahy Scale: Good Standing balance comment: able to ambulate without UE support                            Cognition Arousal/Alertness: Awake/alert Behavior During Therapy: Restless Overall Cognitive Status: Within Functional Limits for tasks assessed                                 General Comments: pt with improved situational awareness. ready for discharge      Exercises Other Exercises Other Exercises: pt educated on  RW versus cane. Kasandra Knudsen should be adequate for safety at home. verbalized understanding and continued HHPT assessment will continue to verify Other Exercises: stair navigation and safety in prep for discharge home    General Comments General comments (skin integrity, edema, etc.): Significant  decrease in b/l UE edema      Pertinent Vitals/Pain Pain Assessment: No/denies pain    Home Living                      Prior Function            PT Goals (current goals can now be found in the care plan section) Acute Rehab PT Goals Patient Stated Goal: to go home Progress towards PT goals: Progressing toward goals    Frequency    Min 2X/week      PT Plan Discharge plan needs to be updated    Co-evaluation              AM-PAC PT "6 Clicks" Mobility   Outcome Measure  Help needed turning from your back to your side while in a flat bed without using bedrails?: Total Help needed moving from lying on your back to sitting on the side of a flat bed without using bedrails?: A Lot Help needed moving to and from a bed to a chair (including a wheelchair)?: A Lot Help needed standing up from a chair using your arms (e.g., wheelchair or bedside chair)?: A Lot Help needed to walk in hospital room?: A Lot Help needed climbing 3-5 steps with a railing? : Total 6 Click Score: 10    End of Session Equipment Utilized During Treatment: Gait belt Activity Tolerance: Patient tolerated treatment well Patient left: in chair;with family/visitor present;with call bell/phone within reach;with chair alarm set (assisted to dialysis chair) Nurse Communication: Mobility status PT Visit Diagnosis: Unsteadiness on feet (R26.81);Muscle weakness (generalized) (M62.81);Difficulty in walking, not elsewhere classified (R26.2);Other symptoms and signs involving the nervous system (R29.898)     Time: 4765-4650 PT Time Calculation (min) (ACUTE ONLY): 9 min  Charges:  $Gait Training: 8-22 mins                     Lieutenant Diego PT, DPT 2:00 PM,09/10/20

## 2020-09-10 NOTE — Progress Notes (Signed)
Inpatient Rehab Admissions Coordinator:   Therapy recommendations updated to home health with pt rapidly progressing.  Will sign off for CIR at this time.   Shann Medal, PT, DPT Admissions Coordinator [phone] 09/10/20  3:07 PM

## 2020-09-19 ENCOUNTER — Telehealth: Payer: Self-pay

## 2020-09-19 NOTE — Telephone Encounter (Signed)
VM left for patient to check in and offer to schedule visit with Palliative NP.

## 2020-09-20 LAB — CULTURE, FUNGUS WITHOUT SMEAR

## 2020-09-26 ENCOUNTER — Other Ambulatory Visit: Payer: Self-pay

## 2020-09-26 ENCOUNTER — Other Ambulatory Visit: Payer: Medicaid Other | Admitting: Nurse Practitioner

## 2020-09-26 ENCOUNTER — Encounter: Payer: Self-pay | Admitting: Nurse Practitioner

## 2020-09-26 DIAGNOSIS — N186 End stage renal disease: Secondary | ICD-10-CM
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
## 2020-10-01 ENCOUNTER — Telehealth: Payer: Self-pay | Admitting: Nurse Practitioner

## 2020-10-01 NOTE — Telephone Encounter (Signed)
Stacy Pearson left a message asking about transportation to her MD appointments. I returned Stacy Pearson call and updated will ask Palliative SW to call to trying to work through the transportation. Stacy Pearson in agreement, email sent to [facility] SW to reach out to Stacy Pearson.

## 2020-10-05 ENCOUNTER — Encounter
Admission: EM | Disposition: A | Discharge status: Home / Self Care | Payer: Self-pay | Source: Home / Self Care | Attending: Emergency Medicine

## 2020-10-05 ENCOUNTER — Emergency Department
Admission: EM | Admit: 2020-10-05 | Discharge: 2020-10-05 | Disposition: A | Discharge status: Home / Self Care | Payer: Medicare HMO | Attending: Emergency Medicine | Admitting: Emergency Medicine

## 2020-10-05 ENCOUNTER — Other Ambulatory Visit: Payer: Self-pay

## 2020-10-05 DIAGNOSIS — Z79899 Other long term (current) drug therapy: Secondary | ICD-10-CM | POA: Insufficient documentation

## 2020-10-05 DIAGNOSIS — G20 Parkinson's disease: Secondary | ICD-10-CM | POA: Diagnosis not present

## 2020-10-05 DIAGNOSIS — Z20822 Contact with and (suspected) exposure to covid-19: Secondary | ICD-10-CM | POA: Diagnosis not present

## 2020-10-05 DIAGNOSIS — N186 End stage renal disease: Secondary | ICD-10-CM

## 2020-10-05 DIAGNOSIS — I120 Hypertensive chronic kidney disease with stage 5 chronic kidney disease or end stage renal disease: Secondary | ICD-10-CM | POA: Insufficient documentation

## 2020-10-05 DIAGNOSIS — Z85828 Personal history of other malignant neoplasm of skin: Secondary | ICD-10-CM | POA: Diagnosis not present

## 2020-10-05 DIAGNOSIS — Z87891 Personal history of nicotine dependence: Secondary | ICD-10-CM | POA: Diagnosis not present

## 2020-10-05 DIAGNOSIS — T8249XA Other complication of vascular dialysis catheter, initial encounter: Secondary | ICD-10-CM | POA: Diagnosis not present

## 2020-10-05 DIAGNOSIS — J45909 Unspecified asthma, uncomplicated: Secondary | ICD-10-CM | POA: Insufficient documentation

## 2020-10-05 DIAGNOSIS — T829XXA Unspecified complication of cardiac and vascular prosthetic device, implant and graft, initial encounter: Secondary | ICD-10-CM

## 2020-10-05 DIAGNOSIS — Z992 Dependence on renal dialysis: Secondary | ICD-10-CM | POA: Diagnosis not present

## 2020-10-05 DIAGNOSIS — J449 Chronic obstructive pulmonary disease, unspecified: Secondary | ICD-10-CM | POA: Insufficient documentation

## 2020-10-05 DIAGNOSIS — T8241XA Breakdown (mechanical) of vascular dialysis catheter, initial encounter: Secondary | ICD-10-CM | POA: Diagnosis present

## 2020-10-05 DIAGNOSIS — Z7951 Long term (current) use of inhaled steroids: Secondary | ICD-10-CM | POA: Diagnosis not present

## 2020-10-05 DIAGNOSIS — N185 Chronic kidney disease, stage 5: Secondary | ICD-10-CM | POA: Diagnosis not present

## 2020-10-05 HISTORY — PX: DIALYSIS/PERMA CATHETER REMOVAL: CATH118289

## 2020-10-05 LAB — RESP PANEL BY RT-PCR (FLU A&B, COVID) ARPGX2
Influenza A by PCR: NEGATIVE
Influenza B by PCR: NEGATIVE
SARS Coronavirus 2 by RT PCR: NEGATIVE

## 2020-10-05 SURGERY — DIALYSIS/PERMA CATHETER REMOVAL
Laterality: Left

## 2020-10-05 MED ORDER — LIDOCAINE-EPINEPHRINE (PF) 1 %-1:200000 IJ SOLN
INTRAMUSCULAR | Status: DC | PRN
  Administered 2020-10-05: 20 mL via INTRADERMAL

## 2020-10-05 SURGICAL SUPPLY — 4 items
APL PRP STRL LF DISP 70% ISPRP (MISCELLANEOUS) ×4
CHLORAPREP W/TINT 26 (MISCELLANEOUS) ×4 IMPLANT
FORCEPS HALSTEAD CVD 5IN STRL (INSTRUMENTS) ×2 IMPLANT
TRAY LACERAT/PLASTIC (MISCELLANEOUS) ×2 IMPLANT

## 2020-10-05 NOTE — Consult Note (Signed)
[facility] Admission History & Physical  MRN : [id number]  Stacy Pearson is a 60 y.o. (29-Sep-1960) female who presents with chief complaint of  Chief Complaint  Patient presents with  . Vascular Access Problem   History of Present Illness:  I am asked to evaluate the patient by Dr. Jari Pigg. The patient was sent here because they have a nonfunctioning tunneled catheter.  At this point, nephrology has noted some improvement in her kidney function.  We were consulted for PermCath removal.  Due to her improving renal function will not exchange for another one at this time. Patient denies pain or tenderness overlying the access.  There is no pain with dialysis.  The patient denies hand pain or finger pain consistent with steal syndrome.  No fevers or chills while on dialysis.  No current facility-administered medications for this encounter.   Past Medical History:  Diagnosis Date  . Abdominal pain   . Allergic genetic state   . Anxiety   . Asthma   . Basal cell carcinoma   . Bipolar disorder ([place])   . Cancer ([place])    MOS surgery basal cell face  . COPD (chronic obstructive pulmonary disease) ([place])   . Depression   . Eating disorder   . Eczema   . Encounter for blood transfusion   . Essential tremor   . 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 GERD (gastroesophageal reflux disease)   . Headache   . History of basal cell carcinoma   . Hypertension   . Neuropathy   . Parkinson's disease ([place])   . Parkinson's disease ([place])   . Parkinsonism [facility])    Past Surgical History:  Procedure Laterality Date  . ABDOMINAL HYSTERECTOMY    . ADENOIDECTOMY    . BLADDER SURGERY    . bone grafts    . CATARACT EXTRACTION W/ INTRAOCULAR LENS IMPLANT    . CERVICAL FUSION    . COLONOSCOPY    . COLONOSCOPY WITH ESOPHAGOGASTRODUODENOSCOPY (EGD)    . COLONOSCOPY WITH PROPOFOL N/A 04/28/2018   Procedure: COLONOSCOPY WITH PROPOFOL;  Surgeon:  Manya Silvas, MD;  Location: [facility] ENDOSCOPY;  Service: Endoscopy;  Laterality: N/A;  . DIALYSIS/PERMA CATHETER INSERTION Right 09/04/2020   Procedure: DIALYSIS/PERMA CATHETER INSERTION;  Surgeon: Katha Cabal, MD;  Location: [facility];  Service: Cardiovascular;  Laterality: Right;  . EYE SURGERY    . GRAFT APPLICATION     in neck  . LAPAROSCOPIC BILATERAL SALPINGO OOPHERECTOMY    . NEUROPLASTY / TRANSPOSITION ULNAR NERVE AT ELBOW    . OOPHORECTOMY    . SKIN CANCER EXCISION    . skin grafts   left forearm; hand bil.lower extremeties  . titanium plate    . TONSILLECTOMY    . TUBAL LIGATION     Social History Social History   Tobacco Use  . Smoking status: Former Smoker    Types: Cigarettes    Quit date: 07/14/2006    Years since quitting: 14.2  . Smokeless tobacco: Never Used  Substance Use Topics  . Alcohol use: Never  . Drug use: Never   Family History Family History  Problem Relation Age of Onset  . Breast cancer Mother   . Lung cancer Father   No family history of bleeding or clotting disorders, autoimmune disease or porphyria.  Allergies  Allergen Reactions  . Codeine Nausea And Vomiting  . Other     Pt states oral steroids send  her into a rage   . Prednisone    REVIEW OF SYSTEMS (Negative unless checked)  Constitutional: [] Weight loss  [] Fever  [] Chills Cardiac: [] Chest pain   [] Chest pressure   [] Palpitations   [] Shortness of breath when laying flat   [] Shortness of breath at rest   [x] Shortness of breath with exertion. Vascular:  [] Pain in legs with walking   [] Pain in legs at rest   [] Pain in legs when laying flat   [] Claudication   [] Pain in feet when walking  [] Pain in feet at rest  [] Pain in feet when laying flat   [] History of DVT   [] Phlebitis   [] Swelling in legs   [] Varicose veins   [] Non-healing ulcers Pulmonary:   [] Uses home oxygen   [] Productive cough   [] Hemoptysis   [] Wheeze  [] COPD   [] Asthma Neurologic:  [] Dizziness  [] Blackouts    [] Seizures   [] History of stroke   [] History of TIA  [] Aphasia   [] Temporary blindness   [] Dysphagia   [] Weakness or numbness in arms   [] Weakness or numbness in legs Musculoskeletal:  [x] Arthritis   [] Joint swelling   [] Joint pain   [] Low back pain Hematologic:  [] Easy bruising  [] Easy bleeding   [] Hypercoagulable state   [] Anemic  [] Hepatitis Gastrointestinal:  [] Blood in stool   [] Vomiting blood  [] Gastroesophageal reflux/heartburn   [] Difficulty swallowing. Genitourinary:  [x] Chronic kidney disease   [] Difficult urination  [] Frequent urination  [] Burning with urination   [] Blood in urine Skin:  [] Rashes   [] Ulcers   [] Wounds Psychological:  [] History of anxiety   []  History of major depression.  Physical Examination  Vitals:   10/05/20 1123 10/05/20 1124 10/05/20 1403  BP: (!) 141/84  (!) 141/75  Pulse: 83  83  Resp: 16  19  Temp: 98.2 F (36.8 C)  98.2 F (36.8 C)  TempSrc: Oral  Oral  SpO2: 97%  96%  Weight:  57.8 kg 57.8 kg  Height:  5' 2.5" (1.588 m) 5' 2.5" (1.588 m)   Body mass index is 22.94 kg/m. Gen: WD/WN, NAD Head: [place]/AT, No temporalis wasting. Prominent temp pulse not noted. Ear/Nose/Throat: Hearing grossly intact, nares w/o erythema or drainage, oropharynx w/o Erythema/Exudate,  Eyes: Conjunctiva clear, sclera non-icteric Neck: Trachea midline.  No JVD.  Pulmonary:  Good air movement, respirations not labored, no use of accessory muscles.  Cardiac: RRR, normal S1, S2. Vascular:  Vessel Right Left  Radial Palpable Palpable  Ulnar Not Palpable Not Palpable  Brachial Palpable Palpable  Carotid Palpable, without bruit Palpable, without bruit   Right IJ PermCath:  Gastrointestinal: soft, non-tender/non-distended. No guarding/reflex.  Musculoskeletal: M/S 5/5 throughout.  Extremities without ischemic changes.  No deformity or atrophy.  Neurologic: Sensation grossly intact in extremities.  Symmetrical.  Speech is fluent. Motor exam as listed above. Psychiatric:  Judgment intact, Mood & affect appropriate for pt's clinical situation. Dermatologic: No rashes or ulcers noted.  No cellulitis or open wounds. Lymph : No Cervical, Axillary, or Inguinal lymphadenopathy.  CBC Lab Results  Component Value Date   WBC 5.9 09/10/2020   HGB 7.8 (L) 09/10/2020   HCT 23.6 (L) 09/10/2020   MCV 91.5 09/10/2020   PLT 353 09/10/2020   BMET    Component Value Date/Time   NA 131 (L) 09/09/2020 0436   NA 134 (L) 03/06/2013 2254   K 3.3 (L) 09/09/2020 0436   K 4.0 03/06/2013 2254   CL 94 (L) 09/09/2020 0436   CL 101 03/06/2013 2254   CO2  23 09/09/2020 0436   CO2 30 03/06/2013 2254   GLUCOSE 72 09/09/2020 0436   GLUCOSE 84 03/06/2013 2254   BUN 20 09/09/2020 0436   BUN 13 03/06/2013 2254   CREATININE 4.10 (H) 09/09/2020 0436   CREATININE 0.67 03/06/2013 2254   CALCIUM 8.3 (L) 09/09/2020 0436   CALCIUM 9.0 03/06/2013 2254   GFRNONAA 12 (L) 09/09/2020 0436   GFRNONAA >60 03/06/2013 2254   GFRAA >60 03/06/2013 2254   CrCl cannot be calculated (Patient's most recent lab result is older than the maximum 21 days allowed.).  COAG Lab Results  Component Value Date   INR 1.2 09/03/2020   INR 1.1 08/28/2020   Radiology MR BRAIN WO CONTRAST  Result Date: 09/05/2020 CLINICAL DATA:  Parkinson's disease and epilepsy. Confusion. Agitation. Mental status changes. EXAM: MRI HEAD WITHOUT CONTRAST TECHNIQUE: Multiplanar, multiecho pulse sequences of the brain and surrounding structures were obtained without intravenous contrast. COMPARISON:  08/28/2020 FINDINGS: Brain: Diffusion imaging does not show any acute or subacute infarction. There is abnormal cortical and subcortical edema in a symmetric pattern affecting both hemispheres in the occipital, posterior temporal, parietal and posterior frontal regions virtually pathognomonic for posterior reversible encephalopathy. No evidence of hemorrhage. No mass effect or shift. Evidence of pre-existing brain pathology. No  hydrocephalus. No extra-axial collection. Vascular: Major vessels at the base of the brain show flow. Skull and upper cervical spine: Negative Sinuses/Orbits: Sinuses are clear. Orbits are negative. Small left mastoid effusion. Other: None IMPRESSION: Pattern of cortical and subcortical edema in both hemispheres virtually pathognomonic for posterior reversible encephalopathy. No evidence of hemorrhage or mass effect. Electronically Signed   By: Nelson Chimes M.D.   On: 09/05/2020 21:48   MR CERVICAL SPINE WO CONTRAST  Result Date: 09/05/2020 CLINICAL DATA:  Parkinson's disease. Mental status changes. Sepsis presentation. EXAM: MRI CERVICAL SPINE WITHOUT CONTRAST TECHNIQUE: Multiplanar, multisequence MR imaging of the cervical spine was performed. No intravenous contrast was administered. COMPARISON:  None. FINDINGS: Alignment: No traumatic malalignment. Minimal scoliotic curvature. 1 mm degenerative anterolisthesis C7-T1. Vertebrae: Previous ACDF C5 through C7. Cord: No cord compression, primary cord lesion or myelomalacia Posterior Fossa, vertebral arteries, paraspinal tissues: Negative Disc levels: Foramen magnum is widely patent. Ordinary osteoarthritis at the C1-2 articulation but no encroachment upon the neural structures. C2-3: Mild facet osteoarthritis on the left.  No stenosis. C3-4: Mild bulging of the disc. Facet osteoarthritis on both sides. No canal stenosis. Mild right foraminal narrowing. C4-5: Endplate osteophytes and bulging of the disc. Facet osteoarthritis on the right. Mild canal narrowing but no compression of the cord. Right foraminal stenosis likely to compress the right C5 nerve. C5 through C7: Previous ACDF has a good appearance with wide patency of the canal and foramina. C7-T1: Facet osteoarthritis right worse than left with 1 mm of anterolisthesis. No compressive canal or foraminal narrowing. IMPRESSION: 1. Previous ACDF C5 through C7 has a good appearance. 2. Degenerative changes above  and below the fusion. No compressive canal stenosis. Right foraminal stenosis primarily secondary to facet arthropathy at C4-5 likely to compress the right C5 nerve. Mild right foraminal narrowing at C3-4. Electronically Signed   By: Nelson Chimes M.D.   On: 09/05/2020 21:52   Assessment/Plan 1.  Complication dialysis device with thrombosis AV access:  Patient's right tunneled catheter is malfunctioning. Nephrology has noted an improvement in the patient's kidney function.  We have been asked by the emergency department/nephrology to remove the patient's PermCath. Therefore, the patient will undergo removal of the tunneled  catheter under local anesthesia.  The risks and benefits were described to the patient.  All questions were answered.    2.  End-stage renal disease requiring hemodialysis:   The patient has shown some signs of recovery in regard to her renal function. She will be following up with nephrology next week  3.  Hypertension:   Patient will continue medical management; nephrology is following no changes in oral medications.  Discussed with Dr. Mayme Genta, PA-C  10/05/2020 2:45 PM

## 2020-10-05 NOTE — ED Notes (Signed)
Per dr Jari Pigg, ok for no labs right now d/t recent labs at another ED

## 2020-10-05 NOTE — ED Notes (Signed)
Pt ambulatory to restroom

## 2020-10-05 NOTE — ED Provider Notes (Signed)
[facility] Emergency Department Provider Note  ____________________________________________   Event Date/Time   First MD Initiated Contact with Patient 10/05/20 1128     (approximate)  I have reviewed the triage vital signs and the nursing notes.   HISTORY  Chief Complaint Vascular Access Problem    HPI JAICEY SWEANEY is a 60 y.o. female with ESRD who comes in for evalution of left chest dialysis catheter. Pt last dialyzed on Tuesday. Pt seen Thursday at [facility] because they couldn't do dialysis due to not getting the cap off.  Patient states that she was on dialysis when she was really sick.  She does not remember a dialysis catheter being placed.  She states that she was told she is going to need to be on dialysis for short period but is unsure how long she needs it for.  She denies any other symptoms otherwise.          Past Medical History:  Diagnosis Date  . Abdominal pain   . Allergic genetic state   . Anxiety   . Asthma   . Basal cell carcinoma   . Bipolar disorder ([place])   . Cancer ([place])    MOS surgery basal cell face  . COPD (chronic obstructive pulmonary disease) ([place])   . Depression   . Eating disorder   . Eczema   . Encounter for blood transfusion   . Essential tremor   . 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 GERD (gastroesophageal reflux disease)   . Headache   . History of basal cell carcinoma   . Hypertension   . Neuropathy   . Parkinson's disease ([place])   . Parkinson's disease ([place])   . Parkinsonism [facility])     Patient Active Problem List   Diagnosis Date Noted  . Epilepsy ([place]) 08/26/2020  . Depression with anxiety 08/26/2020  . HTN (hypertension) 08/26/2020  . Acute metabolic encephalopathy 71/24/5809  . UTI (urinary tract infection) 08/26/2020  . AKI (acute kidney injury) ([place]) 08/26/2020  . Severe sepsis with septic shock ([place]) 08/26/2020  . Delirium 08/26/2020  . Acute diarrhea  03/08/2018  . Status post cervical spinal fusion 12/31/2017  . Cervical spondylosis with radiculopathy 10/28/2017  . Colon polyp 10/08/2017  . Essential tremor 10/08/2017  . Cervical radiculopathy 09/03/2017  . Allergic rhinitis 12/30/2016  . Sting of hornets, wasps, and bees causing poisoning and toxic reactions 12/30/2016  . Allergic rhinitis due to dogs 12/17/2016  . Asthma, currently active 12/17/2016  . Muscle cramps 11/18/2016  . Sleep disorder 08/25/2016  . At risk for falls 08/19/2016  . Elevated vitamin B12 level 08/19/2016  . History of fall within past 90 days 08/19/2016  . Nausea and vomiting 08/19/2016  . Parkinson's disease ([place]) 08/19/2016  . Diaphoresis 03/25/2016  . Dry scalp 03/25/2016  . Dry skin 03/25/2016  . Back pain 07/04/2015  . Tremor 01/02/2014  . Asthma 07/28/2013  . COPD (chronic obstructive pulmonary disease) ([place]) 07/28/2013  . Ulnar neuropathy of left upper extremity 06/06/2013  . Meibomian gland dysfunction 11/09/2012  . Raynaud phenomenon 05/03/2012  . Anorexia nervosa with bulimia 01/17/2011  . Basal cell carcinoma of other specified sites of skin 01/17/2011  . Dyspepsia and other specified disorders of function of stomach 01/17/2011  . Generalized anxiety disorder 01/17/2011  . 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 Migraine without aura 01/17/2011  . Other atopic dermatitis and related conditions 01/17/2011  . Bipolar 1 disorder ([place])  04/28/1995    Past Surgical History:  Procedure Laterality Date  . ABDOMINAL HYSTERECTOMY    . ADENOIDECTOMY    . BLADDER SURGERY    . bone grafts    . CATARACT EXTRACTION W/ INTRAOCULAR LENS IMPLANT    . CERVICAL FUSION    . COLONOSCOPY    . COLONOSCOPY WITH ESOPHAGOGASTRODUODENOSCOPY (EGD)    . COLONOSCOPY WITH PROPOFOL N/A 04/28/2018   Procedure: COLONOSCOPY WITH PROPOFOL;  Surgeon: Manya Silvas, MD;  Location: [facility] ENDOSCOPY;  Service: Endoscopy;  Laterality: N/A;  .  DIALYSIS/PERMA CATHETER INSERTION Right 09/04/2020   Procedure: DIALYSIS/PERMA CATHETER INSERTION;  Surgeon: Katha Cabal, MD;  Location: [facility] LAB;  Service: Cardiovascular;  Laterality: Right;  . EYE SURGERY    . GRAFT APPLICATION     in neck  . LAPAROSCOPIC BILATERAL SALPINGO OOPHERECTOMY    . NEUROPLASTY / TRANSPOSITION ULNAR NERVE AT ELBOW    . OOPHORECTOMY    . SKIN CANCER EXCISION    . skin grafts   left forearm; hand bil.lower extremeties  . titanium plate    . TONSILLECTOMY    . TUBAL LIGATION      Prior to Admission medications   Medication Sig Start Date End Date Taking? Authorizing Provider  amLODipine (NORVASC) 10 MG tablet Take 1 tablet (10 mg total) by mouth daily. 09/11/20   Fritzi Mandes, MD  carbidopa-levodopa (SINEMET IR) 25-100 MG tablet Take 1 tablet by mouth 3 (three) times daily. 09/10/20   Fritzi Mandes, MD  DULoxetine (CYMBALTA) 60 MG capsule Take by mouth. 05/03/19   [provider]  EPINEPHrine 0.3 mg/0.3 mL IJ SOAJ injection Inject into the muscle once.    [provider]  hydrALAZINE (APRESOLINE) 50 MG tablet Take 1 tablet (50 mg total) by mouth 2 (two) times daily. 09/10/20   Fritzi Mandes, MD  Ipratropium-Albuterol (COMBIVENT) 20-100 MCG/ACT AERS respimat Inhale 1 puff into the lungs every 6 (six) hours.    [provider]  lamoTRIgine (LAMICTAL) 25 MG tablet Take 50 mg by mouth at bedtime. 08/14/20   [provider]  multivitamin (RENA-VIT) TABS tablet Take 1 tablet by mouth at bedtime. 09/10/20   Fritzi Mandes, MD  ondansetron (ZOFRAN) 8 MG tablet Take by mouth every 8 (eight) hours as needed for nausea or vomiting.    [provider]  propranolol ER (INDERAL LA) 60 MG 24 hr capsule Take 60 mg by mouth daily. 08/14/20   [provider]  sevelamer carbonate (RENVELA) 800 MG tablet Take 1 tablet (800 mg total) by mouth 3 (three) times daily with meals. 09/10/20   Fritzi Mandes, MD  vitamin B-12 1000 MCG  tablet Take 1 tablet (1,000 mcg total) by mouth daily. 09/11/20   Fritzi Mandes, MD    Allergies Codeine and Prednisone  Family History  Problem Relation Age of Onset  . Breast cancer Mother   . Lung cancer Father     Social History Social History   Tobacco Use  . Smoking status: Former Smoker    Types: Cigarettes    Quit date: 07/14/2006    Years since quitting: 14.2  . Smokeless tobacco: Never Used  Substance Use Topics  . Alcohol use: Never  . Drug use: Never      Review of Systems Constitutional: No fever/chills Eyes: No visual changes. ENT: No sore throat. Cardiovascular: Denies chest pain. Respiratory: Denies shortness of breath. Gastrointestinal: No abdominal pain.  No nausea, no vomiting.  No diarrhea.  No constipation.  Genitourinary: Negative for dysuria. Musculoskeletal: Negative for back pain. Skin: Negative for rash. Neurological: Negative for headaches, focal weakness or numbness. All other ROS negative ____________________________________________   PHYSICAL EXAM:  VITAL SIGNS: ED Triage Vitals  Enc Vitals Group     BP 10/05/20 1123 (!) 141/84     Pulse Rate 10/05/20 1123 83     Resp 10/05/20 1123 16     Temp 10/05/20 1123 98.2 F (36.8 C)     Temp Source 10/05/20 1123 Oral     SpO2 10/05/20 1123 97 %     Weight 10/05/20 1124 127 lb 6.4 oz (57.8 kg)     Height 10/05/20 1124 5' 2.5" (1.588 m)     Head Circumference --      Peak Flow --      Pain Score 10/05/20 1124 9     Pain Loc --      Pain Edu? --      Excl. in [place]? --     Constitutional: Alert and oriented. Well appearing and in no acute distress. Eyes: Conjunctivae are normal. EOMI. Head: Atraumatic. Nose: No congestion/rhinnorhea. Mouth/Throat: Mucous membranes are moist.   Neck: No stridor. Trachea Midline. FROM Cardiovascular: Normal rate, regular rhythm. Grossly normal heart sounds.  Good peripheral circulation.  Chest wall catheter on the left with one port that is  broken Respiratory: Normal respiratory effort.  No retractions. Lungs CTAB. Gastrointestinal: Soft and nontender. No distention. No abdominal bruits.  Musculoskeletal: No lower extremity tenderness nor edema.  No joint effusions. Neurologic:  Normal speech and language. No gross focal neurologic deficits are appreciated.  Skin:  Skin is warm, dry and intact. No rash noted. Psychiatric: Mood and affect are normal. Speech and behavior are normal. GU: Deferred   ____________________________________________   LABS (all labs ordered are listed, but only abnormal results are displayed)  Labs Reviewed  RESP PANEL BY RT-PCR (FLU A&B, COVID) ARPGX2  CBC WITH DIFFERENTIAL/PLATELET  BASIC METABOLIC PANEL   ____________________________________________   ED ECG REPORT I, Vanessa [place], the attending physician, personally viewed and interpreted this ECG.  Normal sinus rate of 82, no ST elevation, no T wave inversion, right bundle branch block incomplete ____________________________________________  PROCEDURES  Procedure(s) performed (including Critical Care):  Procedures   ____________________________________________   INITIAL IMPRESSION / ASSESSMENT AND PLAN / ED COURSE  CRESCENT GOTHAM was evaluated in Emergency Department on 10/05/2020 for the symptoms described in the history of present illness. She was evaluated in the context of the global COVID-19 pandemic, which necessitated consideration that the patient might be at risk for infection with the SARS-CoV-2 virus that causes COVID-19. Institutional protocols and algorithms that pertain to the evaluation of patients at risk for COVID-19 are in a state of rapid change based on information released by regulatory bodies including the CDC and federal and state organizations. These policies and algorithms were followed during the patient's care in the ED.    Patient is a well-appearing 60 year old who comes in with a broken dialysis  catheter.  Patient had labs done at [facility] this morning that showed kidney function of 1.3 and a potassium is slightly low at 2.7.  Her EKG has no evidence of arrhythmia.  I did discuss with Dr. Holley Raring from nephrology and he reviewed her chart and stated that we can hold off on putting a new dialysis catheter and at this time.  I discussed with Dr. Lucky Cowboy from vascular who will remove the catheter and patient will follow up  with Dr. Holley Raring next week for a lab recheck to see if she needs to be restarted back on dialysis or if this was just temporary  We will get preop Covid swab       ____________________________________________   FINAL CLINICAL IMPRESSION(S) / ED DIAGNOSES   Final diagnoses:  Complication associated with dialysis catheter      MEDICATIONS GIVEN DURING THIS VISIT:  Medications - No data to display   ED Discharge Orders    None       Note:  This document was prepared using Dragon voice recognition software and may include unintentional dictation errors.   Vanessa Ewa Gentry, MD 10/05/20 1256

## 2020-10-05 NOTE — ED Triage Notes (Signed)
First Nurse Note:  Arrives for evaluation of left chest dialysis catheter.  Patient states that "Anabel Halon messed up my catheter and they can't get the adapter piece out without breaking my catheter".  Stats last dialysis was Tuesday and she was seen through [facility] for same on Wednesday who told her that this was not an emergency.  Patient is AAOx3.  Skin warm and dry. NAD.

## 2020-10-05 NOTE — ED Notes (Signed)
See triage note, pt states cap on dialysis catheter is stuck and was unable to get dialysis yesterday. T, TH, Saturday dialysis pt.  Pt states went to [facility] and was told could not treat problem at ED. Devita told her to come to ED.  Pt states had labs last night and everything was normal except for low potassium, states has low potassium chronically.

## 2020-10-05 NOTE — Discharge Instructions (Signed)
Incision and Drainage, Care After This sheet gives you information about how to care for yourself after your procedure. Your health care provider may also give you more specific instructions. If you have problems or questions, contact your health care provider. What can I expect after the procedure? After the procedure, it is common to have:  Pain or discomfort around the incision site.  Blood, fluid, or pus (drainage) from the incision.  Redness and firm skin around the incision site. Follow these instructions at home: Medicines  Take over-the-counter and prescription medicines only as told by your health care provider.  If you were prescribed an antibiotic medicine, use or take it as told by your health care provider. Do not stop using the antibiotic even if you start to feel better. Wound care Follow instructions from your health care provider about how to take care of your wound. Make sure you:  Wash your hands with soap and water before and after you change your bandage (dressing). If soap and water are not available, use hand sanitizer.  Change your dressing and packing as told by your health care provider. ? If your dressing is dry or stuck when you try to remove it, moisten or wet the dressing with saline or water so that it can be removed without harming your skin or tissues. ? If your wound is packed, leave it in place until your health care provider tells you to remove it. To remove the packing, moisten or wet the packing with saline or water so that it can be removed without harming your skin or tissues.  Leave stitches (sutures), skin glue, or adhesive strips in place. These skin closures may need to stay in place for 2 weeks or longer. If adhesive strip edges start to loosen and curl up, you may trim the loose edges. Do not remove adhesive strips completely unless your health care provider tells you to do that. Check your wound every day for signs of infection. Check  for:  More redness, swelling, or pain.  More fluid or blood.  Warmth.  Pus or a bad smell. If you were sent home with a drain tube in place, follow instructions from your health care provider about:  How to empty it.  How to care for it at home.   General instructions  Rest the affected area.  Do not take baths, swim, or use a hot tub until your health care provider approves. Ask your health care provider if you may take showers. You may only be allowed to take sponge baths.  Return to your normal activities as told by your health care provider. Ask your health care provider what activities are safe for you. Your health care provider may put you on activity or lifting restrictions.  The incision will continue to drain. It is normal to have some clear or slightly bloody drainage. The amount of drainage should lessen each day.  Do not apply any creams, ointments, or liquids unless you have been told to by your health care provider.  Keep all follow-up visits as told by your health care provider. This is important. Contact a health care provider if:  Your cyst or abscess returns.  You have a fever or chills.  You have more redness, swelling, or pain around your incision.  You have more fluid or blood coming from your incision.  Your incision feels warm to the touch.  You have pus or a bad smell coming from your incision.  You have red   streaks above or below the incision site. Get help right away if:  You have severe pain or bleeding.  You cannot eat or drink without vomiting.  You have decreased urine output.  You become short of breath.  You have chest pain.  You cough up blood.  The affected area becomes numb or starts to tingle. These symptoms may represent a serious problem that is an emergency. Do not wait to see if the symptoms will go away. Get medical help right away. Call your local emergency services (911 in the [place] Do not drive yourself to the  hospital. Summary  After this procedure, it is common to have fluid, blood, or pus coming from the surgery site.  Follow all home care instructions. You will be told how to take care of your incision, how to check for infection, and how to take medicines.  If you were prescribed an antibiotic medicine, take it as told by your health care provider. Do not stop taking the antibiotic even if you start to feel better.  Contact a health care provider if you have increased redness, swelling, or pain around your incision. Get help right away if you have chest pain, you vomit, you cough up blood, or you have shortness of breath.  Keep all follow-up visits as told by your health care provider. This is important. This information is not intended to replace advice given to you by your health care provider. Make sure you discuss any questions you have with your health care provider. Document Revised: 05/31/2018 Document Reviewed: 05/31/2018 Elsevier Patient Education  2021 [place] may shower as of tomorrow. Gently clean the area with soap and water and gently.  Gently pat dry. You may experience some drainage from the site, if you do please apply a dry gauze to the area

## 2020-10-05 NOTE — Op Note (Signed)
Operative Note     Preoperative diagnosis:   1.  Renal failure with return of renal function no longer requiring dialysis  Postoperative diagnosis:  1.  Same  Procedure:  Removal of left jugular Permcath  Surgeon:  Leotis Pain, MD  Anesthesia:  Local  EBL:  Minimal  Indication for the Procedure:  The patient has previously had a PermCath placed for renal failure but has had return of renal function and is no longer requiring dialysis.  This can be removed.  Risks and benefits are discussed and informed consent is obtained.  Description of the Procedure:  The patient's left neck, chest and existing catheter were sterilely prepped and draped. The area around the catheter was anesthetized copiously with 1% lidocaine. The catheter was dissected out with curved hemostats until the cuff was freed from the surrounding fibrous sheath. The fiber sheath was transected, and the catheter was then removed in its entirety using gentle traction. Pressure was held and sterile dressings were placed. The patient tolerated the procedure well and was taken to the recovery room in stable condition.     Leotis Pain  10/05/2020, 2:28 PM This note was created with Dragon Medical transcription system. Any errors in dictation are purely unintentional.

## 2020-10-05 NOTE — ED Triage Notes (Signed)
See first nurse note- Pt here with issue with dialysis catheter. Pt reports pain at site and that the cap on the end of the catheter was placed incorrectly and it isn't working.

## 2020-10-08 ENCOUNTER — Encounter: Payer: Self-pay | Admitting: Vascular Surgery

## 2020-10-31 ENCOUNTER — Emergency Department
Admission: EM | Admit: 2020-10-31 | Discharge: 2020-11-01 | Disposition: A | Discharge status: Home / Self Care | Payer: Medicare HMO | Attending: Emergency Medicine | Admitting: Emergency Medicine

## 2020-10-31 ENCOUNTER — Other Ambulatory Visit: Payer: Medicare HMO | Admitting: Nurse Practitioner

## 2020-10-31 ENCOUNTER — Other Ambulatory Visit: Payer: Self-pay

## 2020-10-31 ENCOUNTER — Encounter: Payer: Self-pay | Admitting: *Deleted

## 2020-10-31 ENCOUNTER — Emergency Department: Payer: Medicare HMO

## 2020-10-31 ENCOUNTER — Encounter: Payer: Self-pay | Admitting: Nurse Practitioner

## 2020-10-31 DIAGNOSIS — I129 Hypertensive chronic kidney disease with stage 1 through stage 4 chronic kidney disease, or unspecified chronic kidney disease: Secondary | ICD-10-CM | POA: Diagnosis not present

## 2020-10-31 DIAGNOSIS — K859 Acute pancreatitis without necrosis or infection, unspecified: Secondary | ICD-10-CM

## 2020-10-31 DIAGNOSIS — N189 Chronic kidney disease, unspecified: Secondary | ICD-10-CM | POA: Diagnosis not present

## 2020-10-31 DIAGNOSIS — R109 Unspecified abdominal pain: Secondary | ICD-10-CM | POA: Diagnosis not present

## 2020-10-31 DIAGNOSIS — Z992 Dependence on renal dialysis: Secondary | ICD-10-CM | POA: Diagnosis not present

## 2020-10-31 DIAGNOSIS — Z7951 Long term (current) use of inhaled steroids: Secondary | ICD-10-CM | POA: Diagnosis not present

## 2020-10-31 DIAGNOSIS — Z79899 Other long term (current) drug therapy: Secondary | ICD-10-CM | POA: Insufficient documentation

## 2020-10-31 DIAGNOSIS — Z85828 Personal history of other malignant neoplasm of skin: Secondary | ICD-10-CM | POA: Diagnosis not present

## 2020-10-31 DIAGNOSIS — J449 Chronic obstructive pulmonary disease, unspecified: Secondary | ICD-10-CM | POA: Insufficient documentation

## 2020-10-31 DIAGNOSIS — J45909 Unspecified asthma, uncomplicated: Secondary | ICD-10-CM | POA: Diagnosis not present

## 2020-10-31 DIAGNOSIS — Z87891 Personal history of nicotine dependence: Secondary | ICD-10-CM | POA: Insufficient documentation

## 2020-10-31 DIAGNOSIS — F419 Anxiety disorder, unspecified: Secondary | ICD-10-CM

## 2020-10-31 DIAGNOSIS — G20 Parkinson's disease: Secondary | ICD-10-CM | POA: Insufficient documentation

## 2020-10-31 DIAGNOSIS — Z515 Encounter for palliative care: Secondary | ICD-10-CM

## 2020-10-31 DIAGNOSIS — F319 Bipolar disorder, unspecified: Secondary | ICD-10-CM

## 2020-10-31 DIAGNOSIS — N12 Tubulo-interstitial nephritis, not specified as acute or chronic: Secondary | ICD-10-CM

## 2020-10-31 DIAGNOSIS — R110 Nausea: Secondary | ICD-10-CM | POA: Insufficient documentation

## 2020-10-31 LAB — COMPREHENSIVE METABOLIC PANEL
ALT: 11 U/L (ref 0–44)
AST: 25 U/L (ref 15–41)
Albumin: 3.9 g/dL (ref 3.5–5.0)
Alkaline Phosphatase: 92 U/L (ref 38–126)
Anion gap: 7 (ref 5–15)
BUN: 11 mg/dL (ref 6–20)
CO2: 28 mmol/L (ref 22–32)
Calcium: 9.4 mg/dL (ref 8.9–10.3)
Chloride: 102 mmol/L (ref 98–111)
Creatinine, Ser: 1.18 mg/dL — ABNORMAL HIGH (ref 0.44–1.00)
GFR, Estimated: 53 mL/min — ABNORMAL LOW (ref >60)
Glucose, Bld: 104 mg/dL — ABNORMAL HIGH (ref 70–99)
Potassium: 3.8 mmol/L (ref 3.5–5.1)
Sodium: 137 mmol/L (ref 135–145)
Total Bilirubin: 0.5 mg/dL (ref 0.3–1.2)
Total Protein: 7.2 g/dL (ref 6.5–8.1)

## 2020-10-31 LAB — URINALYSIS, COMPLETE (UACMP) WITH MICROSCOPIC
Bacteria, UA: NONE SEEN
Bilirubin Urine: NEGATIVE
Glucose, UA: NEGATIVE mg/dL
Hgb urine dipstick: NEGATIVE
Ketones, ur: NEGATIVE mg/dL
Nitrite: NEGATIVE
Protein, ur: NEGATIVE mg/dL
Specific Gravity, Urine: 1.006 (ref 1.005–1.030)
Squamous Epithelial / HPF: NONE SEEN (ref 0–5)
pH: 6 (ref 5.0–8.0)

## 2020-10-31 LAB — CBC
HCT: 32.4 % — ABNORMAL LOW (ref 36.0–46.0)
Hemoglobin: 10.4 g/dL — ABNORMAL LOW (ref 12.0–15.0)
MCH: 31 pg (ref 26.0–34.0)
MCHC: 32.1 g/dL (ref 30.0–36.0)
MCV: 96.4 fL (ref 80.0–100.0)
Platelets: 350 10*3/uL (ref 150–400)
RBC: 3.36 MIL/uL — ABNORMAL LOW (ref 3.87–5.11)
RDW: 14.2 % (ref 11.5–15.5)
WBC: 8.2 10*3/uL (ref 4.0–10.5)
nRBC: 0 % (ref 0.0–0.2)

## 2020-10-31 LAB — LIPASE, BLOOD: Lipase: 93 U/L — ABNORMAL HIGH (ref 11–51)

## 2020-10-31 MED ORDER — ONDANSETRON HCL 4 MG/2ML IJ SOLN
4.0000 mg | Freq: Once | INTRAMUSCULAR | Status: AC
  Administered 2020-10-31: 4 mg via INTRAVENOUS
  Filled 2020-10-31: qty 2

## 2020-10-31 MED ORDER — MORPHINE SULFATE (PF) 4 MG/ML IV SOLN
4.0000 mg | Freq: Once | INTRAVENOUS | Status: AC
  Administered 2020-10-31: 4 mg via INTRAVENOUS
  Filled 2020-10-31: qty 1

## 2020-10-31 MED ORDER — IOHEXOL 300 MG/ML  SOLN
75.0000 mL | Freq: Once | INTRAMUSCULAR | Status: AC | PRN
  Administered 2020-10-31: 75 mL via INTRAVENOUS

## 2020-10-31 NOTE — ED Provider Notes (Signed)
[facility] Emergency Department Provider Note ____________________________________________   Event Date/Time   First MD Initiated Contact with Patient 10/31/20 2112     (approximate)  I have reviewed the triage vital signs and the nursing notes.  HISTORY  Chief Complaint Abdominal Pain   HPI Stacy Pearson is a 60 y.o. femalewho presents to the ED for evaluation of abdominal pain.  Chart review indicates history of CKD previously on dialysis, with return of renal function and recently had removal of left IJ permacath by Dr. Lucky Cowboy performed on 3/25. Otherwise history of bipolar disorder, parkinsonism on Sinemet, epilepsy, HTN and COPD.  Patient presents to the ED, accompanied by her mother who provides some additional history, for evaluation of left-sided flank and abdominal pain with associated decreased urination volume.  Reporting up to 8/10 intensity pain to her left flank, radiating to her left groin.  Denies history of nephro or ureterolithiasis.  Reports associated nausea and decreased urinary output without dysuria or hematuria.  Denies diarrhea or stool changes.  Denies fever or emesis.  Outpatient nephrology visit with Dr. Zollie Scale on 3/28 with a creatinine of 1.29.  Past Medical History:  Diagnosis Date  . Abdominal pain   . Allergic genetic state   . Anxiety   . Asthma   . Basal cell carcinoma   . Bipolar disorder ([place])   . Cancer ([place])    MOS surgery basal cell face  . COPD (chronic obstructive pulmonary disease) ([place])   . Depression   . Eating disorder   . Eczema   . Encounter for blood transfusion   . Essential tremor   . 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 GERD (gastroesophageal reflux disease)   . Headache   . History of basal cell carcinoma   . Hypertension   . Neuropathy   . Parkinson's disease ([place])   . Parkinson's disease ([place])   . Parkinsonism [facility])     Patient Active Problem List    Diagnosis Date Noted  . Epilepsy ([place]) 08/26/2020  . Depression with anxiety 08/26/2020  . HTN (hypertension) 08/26/2020  . Acute metabolic encephalopathy 20/94/7096  . UTI (urinary tract infection) 08/26/2020  . AKI (acute kidney injury) ([place]) 08/26/2020  . Severe sepsis with septic shock ([place]) 08/26/2020  . Delirium 08/26/2020  . Acute diarrhea 03/08/2018  . Status post cervical spinal fusion 12/31/2017  . Cervical spondylosis with radiculopathy 10/28/2017  . Colon polyp 10/08/2017  . Essential tremor 10/08/2017  . Cervical radiculopathy 09/03/2017  . Allergic rhinitis 12/30/2016  . Sting of hornets, wasps, and bees causing poisoning and toxic reactions 12/30/2016  . Allergic rhinitis due to dogs 12/17/2016  . Asthma, currently active 12/17/2016  . Muscle cramps 11/18/2016  . Sleep disorder 08/25/2016  . At risk for falls 08/19/2016  . Elevated vitamin B12 level 08/19/2016  . History of fall within past 90 days 08/19/2016  . Nausea and vomiting 08/19/2016  . Parkinson's disease ([place]) 08/19/2016  . Diaphoresis 03/25/2016  . Dry scalp 03/25/2016  . Dry skin 03/25/2016  . Back pain 07/04/2015  . Tremor 01/02/2014  . Asthma 07/28/2013  . COPD (chronic obstructive pulmonary disease) ([place]) 07/28/2013  . Ulnar neuropathy of left upper extremity 06/06/2013  . Meibomian gland dysfunction 11/09/2012  . Raynaud phenomenon 05/03/2012  . Anorexia nervosa with bulimia 01/17/2011  . Basal cell carcinoma of other specified sites of skin 01/17/2011  . Dyspepsia and other specified disorders of function of stomach 01/17/2011  .  Generalized anxiety disorder 01/17/2011  . Generalized tonic clonic epilepsy ([place]) 01/17/2011  . Migraine without aura 01/17/2011  . Other atopic dermatitis and related conditions 01/17/2011  . Bipolar 1 disorder ([place]) 04/28/1995    Past Surgical History:  Procedure Laterality Date  . ABDOMINAL HYSTERECTOMY    . ADENOIDECTOMY    . BLADDER SURGERY    . bone  grafts    . CATARACT EXTRACTION W/ INTRAOCULAR LENS IMPLANT    . CERVICAL FUSION    . COLONOSCOPY    . COLONOSCOPY WITH ESOPHAGOGASTRODUODENOSCOPY (EGD)    . COLONOSCOPY WITH PROPOFOL N/A 04/28/2018   Procedure: COLONOSCOPY WITH PROPOFOL;  Surgeon: Manya Silvas, MD;  Location: [facility] ENDOSCOPY;  Service: Endoscopy;  Laterality: N/A;  . DIALYSIS/PERMA CATHETER INSERTION Right 09/04/2020   Procedure: DIALYSIS/PERMA CATHETER INSERTION;  Surgeon: Katha Cabal, MD;  Location: [place] CV LAB;  Service: Cardiovascular;  Laterality: Right;  . DIALYSIS/PERMA CATHETER REMOVAL Left 10/05/2020   Procedure: DIALYSIS/PERMA CATHETER REMOVAL;  Surgeon: Algernon Huxley, MD;  Location: [place] CV LAB;  Service: Cardiovascular;  Laterality: Left;  . EYE SURGERY    . GRAFT APPLICATION     in neck  . LAPAROSCOPIC BILATERAL SALPINGO OOPHERECTOMY    . NEUROPLASTY / TRANSPOSITION ULNAR NERVE AT ELBOW    . OOPHORECTOMY    . SKIN CANCER EXCISION    . skin grafts   left forearm; hand bil.lower extremeties  . titanium plate    . TONSILLECTOMY    . TUBAL LIGATION      Prior to Admission medications   Medication Sig Start Date End Date Taking? Authorizing Provider  amLODipine (NORVASC) 10 MG tablet Take 1 tablet (10 mg total) by mouth daily. 09/11/20   Fritzi Mandes, MD  carbidopa-levodopa (SINEMET IR) 25-100 MG tablet Take 1 tablet by mouth 3 (three) times daily. 09/10/20   Fritzi Mandes, MD  DULoxetine (CYMBALTA) 60 MG capsule Take by mouth. 05/03/19   [provider]  EPINEPHrine 0.3 mg/0.3 mL IJ SOAJ injection Inject into the muscle once.    [provider]  hydrALAZINE (APRESOLINE) 50 MG tablet Take 1 tablet (50 mg total) by mouth 2 (two) times daily. 09/10/20   Fritzi Mandes, MD  Ipratropium-Albuterol (COMBIVENT) 20-100 MCG/ACT AERS respimat Inhale 1 puff into the lungs every 6 (six) hours.    [provider]  lamoTRIgine (LAMICTAL) 25 MG tablet Take 50 mg by mouth at  bedtime. 08/14/20   [provider]  multivitamin (RENA-VIT) TABS tablet Take 1 tablet by mouth at bedtime. 09/10/20   Fritzi Mandes, MD  ondansetron (ZOFRAN) 8 MG tablet Take by mouth every 8 (eight) hours as needed for nausea or vomiting.    [provider]  propranolol ER (INDERAL LA) 60 MG 24 hr capsule Take 60 mg by mouth daily. 08/14/20   [provider]  sevelamer carbonate (RENVELA) 800 MG tablet Take 1 tablet (800 mg total) by mouth 3 (three) times daily with meals. 09/10/20   Fritzi Mandes, MD  vitamin B-12 1000 MCG tablet Take 1 tablet (1,000 mcg total) by mouth daily. 09/11/20   Fritzi Mandes, MD    Allergies Codeine, Other, and Prednisone  Family History  Problem Relation Age of Onset  . Breast cancer Mother   . Lung cancer Father     Social History Social History   Tobacco Use  . Smoking status: Former Smoker    Types: Cigarettes    Quit date: 07/14/2006    Years since  quitting: 14.3  . Smokeless tobacco: Never Used  Substance Use Topics  . Alcohol use: Never  . Drug use: Never    Review of Systems  Constitutional: No fever/chills Eyes: No visual changes. ENT: No sore throat. Cardiovascular: Denies chest pain. Respiratory: Denies shortness of breath. Gastrointestinal: Positive for nausea, left flank and abdominal pain no vomiting.  No diarrhea.  No constipation. Genitourinary: Negative for dysuria. Musculoskeletal: Negative for back pain. Skin: Negative for rash. Neurological: Negative for headaches, focal weakness or numbness.  ____________________________________________   PHYSICAL EXAM:  VITAL SIGNS: Vitals:   10/31/20 2119  BP: (!) 153/87  Pulse: 70  Resp: 18  Temp: 97.6 F (36.4 C)  SpO2: 98%     Constitutional: Alert and oriented. Well appearing and in no acute distress. Eyes: Conjunctivae are normal. PERRL. EOMI. Head: Atraumatic. Nose: No congestion/rhinnorhea. Mouth/Throat: Mucous membranes are moist.  Oropharynx  non-erythematous. Neck: No stridor. No cervical spine tenderness to palpation. Cardiovascular: Normal rate, regular rhythm. Grossly normal heart sounds.  Good peripheral circulation. Respiratory: Normal respiratory effort.  No retractions. Lungs CTAB. Gastrointestinal: Soft , nondistended. Poorly localizing tenderness throughout her left-sided abdomen and left CVA.  No peritoneal features.  Right-sided abdomen is benign. Musculoskeletal: No lower extremity tenderness nor edema.  No joint effusions. No signs of acute trauma. Neurologic:  Normal speech and language. No gross focal neurologic deficits are appreciated. No gait instability noted. Skin:  Skin is warm, dry and intact. No rash noted. Psychiatric: Mood and affect are normal. Speech and behavior are normal.  ____________________________________________   LABS (all labs ordered are listed, but only abnormal results are displayed)  Labs Reviewed  LIPASE, BLOOD - Abnormal; Notable for the following components:      Result Value   Lipase 93 (*)    All other components within normal limits  COMPREHENSIVE METABOLIC PANEL - Abnormal; Notable for the following components:   Glucose, Bld 104 (*)    Creatinine, Ser 1.18 (*)    GFR, Estimated 53 (*)    All other components within normal limits  CBC - Abnormal; Notable for the following components:   RBC 3.36 (*)    Hemoglobin 10.4 (*)    HCT 32.4 (*)    All other components within normal limits  URINALYSIS, COMPLETE (UACMP) WITH MICROSCOPIC  POC URINE PREG, ED   ____________________________________________  RADIOLOGY  ED MD interpretation: CT abdomen/pelvis pending at the time of signout  Official radiology report(s): No results found.  ____________________________________________   PROCEDURES and INTERVENTIONS  Procedure(s) performed (including Critical Care):  .1-3 Lead EKG Interpretation Performed by: Vladimir Crofts, MD Authorized by: Vladimir Crofts, MD      Interpretation: normal     ECG rate:  70   ECG rate assessment: normal     Rhythm: sinus rhythm     Ectopy: none     Conduction: normal      Medications  morphine 4 MG/ML injection 4 mg (has no administration in time range)  ondansetron (ZOFRAN) injection 4 mg (has no administration in time range)  iohexol (OMNIPAQUE) 300 MG/ML solution 75 mL (75 mLs Intravenous Contrast Given 10/31/20 2247)    ____________________________________________   MDM / ED COURSE   60 year old female with newly improving renal function presents to the ED with 3 days of isolated left-sided abdominal pain.  Normal vitals.  Exam with poorly localizing tenderness without peritoneal features to her abdomen.  No signs of distress, dehydration, neurologic or vascular deficits.  No signs of trauma.  Blood work is reassuring with continued intact renal function without any worsening.  Urinalysis pending for evaluation of acute cystitis or pyelonephritis contributing to her symptoms.  CT abdomen/pelvis obtained to evaluate ureteral stones is pending at time of signout to oncoming provider.       ____________________________________________   FINAL CLINICAL IMPRESSION(S) / ED DIAGNOSES  Final diagnoses:  Left flank pain     ED Discharge Orders    None       Catelynn Sparger   Note:  This document was prepared using Dragon voice recognition software and may include unintentional dictation errors.   Vladimir Crofts, MD 10/31/20 2255

## 2020-10-31 NOTE — ED Triage Notes (Addendum)
Pt has abd pain.  No n/v/d. Pt had dialysis cath removed 1 month ago.  Pt reports back pain   Pt alert
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
## 2020-11-01 ENCOUNTER — Telehealth: Payer: Self-pay

## 2020-11-01 ENCOUNTER — Inpatient Hospital Stay: Payer: Medicare HMO

## 2020-11-01 ENCOUNTER — Inpatient Hospital Stay: Payer: Medicare HMO | Attending: Oncology | Admitting: Oncology

## 2020-11-01 ENCOUNTER — Encounter: Payer: Self-pay | Admitting: Oncology

## 2020-11-01 VITALS — BP 108/68 | HR 65 | Temp 97.1°F | Resp 18 | Wt 125.1 lb

## 2020-11-01 DIAGNOSIS — D649 Anemia, unspecified: Secondary | ICD-10-CM

## 2020-11-01 DIAGNOSIS — R109 Unspecified abdominal pain: Secondary | ICD-10-CM | POA: Diagnosis not present

## 2020-11-01 DIAGNOSIS — D631 Anemia in chronic kidney disease: Secondary | ICD-10-CM | POA: Diagnosis not present

## 2020-11-01 DIAGNOSIS — N1831 Chronic kidney disease, stage 3a: Secondary | ICD-10-CM

## 2020-11-01 DIAGNOSIS — I129 Hypertensive chronic kidney disease with stage 1 through stage 4 chronic kidney disease, or unspecified chronic kidney disease: Secondary | ICD-10-CM | POA: Diagnosis present

## 2020-11-01 DIAGNOSIS — D509 Iron deficiency anemia, unspecified: Secondary | ICD-10-CM | POA: Insufficient documentation

## 2020-11-01 DIAGNOSIS — N183 Chronic kidney disease, stage 3 unspecified: Secondary | ICD-10-CM | POA: Insufficient documentation

## 2020-11-01 DIAGNOSIS — E639 Nutritional deficiency, unspecified: Secondary | ICD-10-CM | POA: Insufficient documentation

## 2020-11-01 DIAGNOSIS — Z79899 Other long term (current) drug therapy: Secondary | ICD-10-CM | POA: Insufficient documentation

## 2020-11-01 DIAGNOSIS — E039 Hypothyroidism, unspecified: Secondary | ICD-10-CM | POA: Diagnosis not present

## 2020-11-01 LAB — CBC WITH DIFFERENTIAL/PLATELET
Abs Immature Granulocytes: 0.01 10*3/uL (ref 0.00–0.07)
Basophils Absolute: 0.1 10*3/uL (ref 0.0–0.1)
Basophils Relative: 1 %
Eosinophils Absolute: 0.2 10*3/uL (ref 0.0–0.5)
Eosinophils Relative: 3 %
HCT: 36.8 % (ref 36.0–46.0)
Hemoglobin: 11.8 g/dL — ABNORMAL LOW (ref 12.0–15.0)
Immature Granulocytes: 0 %
Lymphocytes Relative: 21 %
Lymphs Abs: 1.4 10*3/uL (ref 0.7–4.0)
MCH: 31.4 pg (ref 26.0–34.0)
MCHC: 32.1 g/dL (ref 30.0–36.0)
MCV: 97.9 fL (ref 80.0–100.0)
Monocytes Absolute: 0.4 10*3/uL (ref 0.1–1.0)
Monocytes Relative: 6 %
Neutro Abs: 4.5 10*3/uL (ref 1.7–7.7)
Neutrophils Relative %: 69 %
Platelets: 245 10*3/uL (ref 150–400)
RBC: 3.76 MIL/uL — ABNORMAL LOW (ref 3.87–5.11)
RDW: 14.4 % (ref 11.5–15.5)
WBC: 6.4 10*3/uL (ref 4.0–10.5)
nRBC: 0 % (ref 0.0–0.2)

## 2020-11-01 LAB — IRON AND TIBC
Iron: 84 ug/dL (ref 28–170)
Saturation Ratios: 21 % (ref 10.4–31.8)
TIBC: 398 ug/dL (ref 250–450)
UIBC: 314 ug/dL

## 2020-11-01 LAB — RETIC PANEL
Immature Retic Fract: 11.4 % (ref 2.3–15.9)
RBC.: 3.75 MIL/uL — ABNORMAL LOW (ref 3.87–5.11)
Retic Count, Absolute: 35.6 10*3/uL (ref 19.0–186.0)
Retic Ct Pct: 1 % (ref 0.4–3.1)
Reticulocyte Hemoglobin: 33.3 pg (ref >27.9)

## 2020-11-01 LAB — TECHNOLOGIST SMEAR REVIEW

## 2020-11-01 LAB — LACTATE DEHYDROGENASE: LDH: 141 U/L (ref 98–192)

## 2020-11-01 LAB — TSH: TSH: 2.89 u[IU]/mL (ref 0.350–4.500)

## 2020-11-01 LAB — FERRITIN: Ferritin: 152 ng/mL (ref 11–307)

## 2020-11-01 LAB — VITAMIN B12: Vitamin B-12: 824 pg/mL (ref 180–914)

## 2020-11-01 LAB — FOLATE: Folate: 87 ng/mL (ref >5.9)

## 2020-11-01 MED ORDER — CEPHALEXIN 500 MG PO CAPS: 500.0000 mg | ORAL_CAPSULE | Freq: Three times a day (TID) | ORAL | 0 refills | Status: AC

## 2020-11-01 MED ORDER — OXYCODONE-ACETAMINOPHEN 5-325 MG PO TABS: 1.0000 | ORAL_TABLET | ORAL | 0 refills | Status: DC | PRN

## 2020-11-01 MED ORDER — SODIUM CHLORIDE 0.9 % IV SOLN
1.0000 g | Freq: Once | INTRAVENOUS | Status: AC
  Administered 2020-11-01: 1 g via INTRAVENOUS
  Filled 2020-11-01: qty 10

## 2020-11-01 MED ORDER — ACETAMINOPHEN 500 MG PO TABS
1000.0000 mg | ORAL_TABLET | Freq: Once | ORAL | Status: AC
  Administered 2020-11-01: 1000 mg via ORAL
  Filled 2020-11-01: qty 2

## 2020-11-01 MED ORDER — ONDANSETRON 4 MG PO TBDP: 4.0000 mg | ORAL_TABLET | Freq: Three times a day (TID) | ORAL | 0 refills | Status: DC | PRN

## 2020-11-01 MED ORDER — OXYCODONE HCL 5 MG PO TABS
5.0000 mg | ORAL_TABLET | Freq: Once | ORAL | Status: AC
  Administered 2020-11-01: 5 mg via ORAL
  Filled 2020-11-01: qty 1

## 2020-11-01 NOTE — Discharge Instructions (Signed)
Your work-up showed mild inflammation of your pancreas and possible infection of the right kidney.  Take Keflex as prescribed for the infection in the kidney.  Take oxycodone and Zofran for inflammation of the pancreas.  Eat a bland diet (see information about bland diet attached).  Follow-up with your doctor in 2 days.  Drink lots of fluids.  Return to the emergency room for new or worsening abdominal pain, fever or chills.

## 2020-11-01 NOTE — Progress Notes (Signed)
Patient here to establish care for anemia in chronic kidney disease. Pt went to ER last night due to abdominal/ flank pain.

## 2020-11-01 NOTE — Progress Notes (Signed)
Hematology/Oncology Consult note [facility] [phone] [phone]   Patient Care Team: Valera Castle, MD as PCP - General (Family Medicine)  REFERRING PROVIDER: Anthonette Legato, MD  CHIEF COMPLAINTS/REASON FOR VISIT:  Evaluation of anemia  HISTORY OF PRESENTING ILLNESS:  Stacy Pearson is a  59 y.o.  female with PMH listed below who was referred to me for evaluation of anemia Reviewed patient's recent labs that was done.  Labs revealed anemia with hemoglobin of .   Reviewed patient's previous labs ordered by primary care physician's office, anemia is chronic onset , duration is since 2007  Patient was hospitalized in February 2022 due to severe sepsis, septic shock, acute respiratory failure altered mental status, acute kidney injury due to ATN and was on hemodialysis.  Currently she is off hemodialysis and kidney function has recovered.  Stage III chronic kidney disease.  She was accompanied by her mother today for evaluation of anemia.  Reports feeling tired, seeing stars occasionally. Patient has Parkinson's disease. Denies any black or bloody stool, hematuria, epigastric pain.    Review of Systems  Constitutional: Positive for fatigue. Negative for appetite change, chills and fever.  HENT:   Negative for hearing loss and voice change.   Eyes: Negative for eye problems.  Respiratory: Negative for chest tightness and cough.   Cardiovascular: Negative for chest pain.  Gastrointestinal: Negative for abdominal distention, abdominal pain and blood in stool.  Endocrine: Negative for hot flashes.  Genitourinary: Negative for difficulty urinating and frequency.   Musculoskeletal: Negative for arthralgias.  Skin: Negative for itching and rash.  Neurological: Negative for extremity weakness.  Hematological: Negative for adenopathy.  Psychiatric/Behavioral: Negative for confusion.     MEDICAL HISTORY:  Past Medical History:   Diagnosis Date  . Abdominal pain   . Allergic genetic state   . Anxiety   . Asthma   . Basal cell carcinoma   . Bipolar disorder ([place])   . Cancer ([place])    MOS surgery basal cell face  . COPD (chronic obstructive pulmonary disease) ([place])   . Depression   . Eating disorder   . Eczema   . Encounter for blood transfusion    plasma transfusion  . Essential tremor   . 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 GERD (gastroesophageal reflux disease)   . Headache   . History of basal cell carcinoma   . Hypertension   . Neuropathy   . Parkinson's disease ([place])   . Parkinson's disease ([place])   . Parkinsonism [facility])     SURGICAL HISTORY: Past Surgical History:  Procedure Laterality Date  . ABDOMINAL HYSTERECTOMY    . ADENOIDECTOMY    . BLADDER SURGERY    . bone grafts    . CATARACT EXTRACTION W/ INTRAOCULAR LENS IMPLANT    . CERVICAL FUSION    . COLONOSCOPY    . COLONOSCOPY WITH ESOPHAGOGASTRODUODENOSCOPY (EGD)    . COLONOSCOPY WITH PROPOFOL N/A 04/28/2018   Procedure: COLONOSCOPY WITH PROPOFOL;  Surgeon: Manya Silvas, MD;  Location: [facility] ENDOSCOPY;  Service: Endoscopy;  Laterality: N/A;  . DIALYSIS/PERMA CATHETER INSERTION Right 09/04/2020   Procedure: DIALYSIS/PERMA CATHETER INSERTION;  Surgeon: Katha Cabal, MD;  Location: [facility] LAB;  Service: Cardiovascular;  Laterality: Right;  . DIALYSIS/PERMA CATHETER REMOVAL Left 10/05/2020   Procedure: DIALYSIS/PERMA CATHETER REMOVAL;  Surgeon: Algernon Huxley, MD;  Location: [facility];  Service: Cardiovascular;  Laterality: Left;  . EYE SURGERY    .  GRAFT APPLICATION     in neck  . LAPAROSCOPIC BILATERAL SALPINGO OOPHERECTOMY    . NEUROPLASTY / TRANSPOSITION ULNAR NERVE AT ELBOW    . OOPHORECTOMY    . SKIN CANCER EXCISION    . skin grafts   left forearm; hand bil.lower extremeties  . titanium plate    . TONSILLECTOMY    . TUBAL LIGATION      SOCIAL HISTORY: Social History    Socioeconomic History  . Marital status: Single    Spouse name: Not on file  . Number of children: Not on file  . Years of education: Not on file  . Highest education level: Not on file  Occupational History  . Not on file  Tobacco Use  . Smoking status: Former Smoker    Packs/day: 1.00    Years: 20.00    Pack years: 20.00    Types: Cigarettes    Quit date: 07/14/2006    Years since quitting: 14.3  . Smokeless tobacco: Never Used  Vaping Use  . Vaping Use: Every day  Substance and Sexual Activity  . Alcohol use: Never  . Drug use: Never  . Sexual activity: Not on file  Other Topics Concern  . Not on file  Social History Narrative  . Not on file   Social Determinants of Health   Financial Resource Strain: Not on file  Food Insecurity: Not on file  Transportation Needs: Not on file  Physical Activity: Not on file  Stress: Not on file  Social Connections: Not on file  Intimate Partner Violence: Not on file    FAMILY HISTORY: Family History  Problem Relation Age of Onset  . Breast cancer Mother   . Colon cancer Mother   . Arthritis Mother   . Lung cancer Father     ALLERGIES:  is allergic to codeine, other, and prednisone.  MEDICATIONS:  Current Outpatient Medications  Medication Sig Dispense Refill  . amLODipine (NORVASC) 10 MG tablet Take 1 tablet (10 mg total) by mouth daily. 30 tablet 1  . carbidopa-levodopa (SINEMET IR) 25-100 MG tablet Take 1 tablet by mouth 3 (three) times daily. 90 tablet 1  . EPINEPHrine 0.3 mg/0.3 mL IJ SOAJ injection Inject into the muscle once.    . hydrALAZINE (APRESOLINE) 50 MG tablet Take 1 tablet (50 mg total) by mouth 2 (two) times daily. 60 tablet 2  . Ipratropium-Albuterol (COMBIVENT) 20-100 MCG/ACT AERS respimat Inhale 1 puff into the lungs every 6 (six) hours.    . multivitamin (RENA-VIT) TABS tablet Take 1 tablet by mouth at bedtime. 30 tablet 0  . ondansetron (ZOFRAN ODT) 4 MG disintegrating tablet Take 1 tablet (4 mg  total) by mouth every 8 (eight) hours as needed. 20 tablet 0  . oxyCODONE-acetaminophen (PERCOCET) 5-325 MG tablet Take 1 tablet by mouth every 4 (four) hours as needed. 12 tablet 0  . propranolol ER (INDERAL LA) 60 MG 24 hr capsule Take 60 mg by mouth daily.    . vitamin B-12 1000 MCG tablet Take 1 tablet (1,000 mcg total) by mouth daily. 30 tablet 0  . cephALEXin (KEFLEX) 500 MG capsule Take 1 capsule (500 mg total) by mouth 3 (three) times daily for 7 days. (Patient not taking: Reported on 11/01/2020) 21 capsule 0  . DULoxetine (CYMBALTA) 60 MG capsule Take by mouth. (Patient not taking: Reported on 11/01/2020)    . lamoTRIgine (LAMICTAL) 25 MG tablet Take 50 mg by mouth at bedtime. (Patient not taking: Reported on 11/01/2020)    .  sevelamer carbonate (RENVELA) 800 MG tablet Take 1 tablet (800 mg total) by mouth 3 (three) times daily with meals. (Patient not taking: Reported on 11/01/2020) 90 tablet 2   No current facility-administered medications for this visit.     PHYSICAL EXAMINATION: ECOG PERFORMANCE STATUS: 1 - Symptomatic but completely ambulatory Vitals:   11/01/20 0940  BP: 108/68  Pulse: 65  Resp: 18  Temp: (!) 97.1 F (36.2 C)   Filed Weights   11/01/20 0940  Weight: 125 lb 1.6 oz (56.7 kg)    Physical Exam Constitutional:      General: She is not in acute distress. HENT:     Head: Normocephalic and atraumatic.  Eyes:     General: No scleral icterus. Cardiovascular:     Rate and Rhythm: Normal rate and regular rhythm.     Heart sounds: Normal heart sounds.  Pulmonary:     Effort: Pulmonary effort is normal. No respiratory distress.     Breath sounds: No wheezing.  Abdominal:     General: Bowel sounds are normal. There is no distension.     Palpations: Abdomen is soft.  Musculoskeletal:        General: No deformity. Normal range of motion.     Cervical back: Normal range of motion and neck supple.  Skin:    General: Skin is warm and dry.     Findings: No  erythema or rash.  Neurological:     Mental Status: She is alert and oriented to person, place, and time. Mental status is at baseline.     Cranial Nerves: No cranial nerve deficit.     Coordination: Coordination normal.  Psychiatric:        Mood and Affect: Mood normal.      LABORATORY DATA:  I have reviewed the data as listed Lab Results  Component Value Date   WBC 6.4 11/01/2020   HGB 11.8 (L) 11/01/2020   HCT 36.8 11/01/2020   MCV 97.9 11/01/2020   PLT 245 11/01/2020   Recent Labs    08/30/20 1000 08/31/20 0911 09/03/20 2054 09/04/20 0509 09/06/20 1522 09/09/20 0436 10/31/20 2158  NA 143   < > 140   < > 138 131* 137  K 4.1   < > 5.0   < > 3.9 3.3* 3.8  CL 101   < > 100   < > 98 94* 102  CO2 22   < > 18*   < > _0 GLUCOSE 80   < > 85   < > 127* 72 104*  BUN 77*   < > 72*   < > 58* 20 11  CREATININE 7.49*   < > 9.14*   < > 6.83* 4.10* 1.18*  CALCIUM 8.8*   < > 8.9   < > 8.6* 8.3* 9.4  GFRNONAA 6*   < > 5*   < > 6* 12* 53*  PROT 6.5  --  7.0  --   --   --  7.2  ALBUMIN 2.7*  2.7*   < > 2.7*  --  3.0*  --  3.9  AST 20  --  11*  --   --   --  25  ALT <5  --  <5  --   --   --  11  ALKPHOS 75  --  72  --   --   --  92  BILITOT 0.6  --  1.2  --   --   --  0.5  BILIDIR <0.1  --   --   --   --   --   --   IBILI NOT CALCULATED  --   --   --   --   --   --    < > = values in this interval not displayed.   Iron/TIBC/Ferritin/ %Sat    Component Value Date/Time   IRON 67 01/15/2006 1305   TIBC 359 01/15/2006 1305   FERRITIN 36 01/15/2006 1305   IRONPCTSAT 19 (L) 01/15/2006 1305        ASSESSMENT & PLAN:  1. Anemia, unspecified type   2. Anemia in stage 3a chronic kidney disease (HCC)    Anemia: multifactorial with possible causes including chronic blood loss, hyper/hypothyroidism, nutritional deficiency, infection/chronic inflammation, hemolysis, underlying bone marrow disorders.  Her chronic kidney disease probably attribute to anemia.  Rule out other  etiologies. Will check CBC w differential, CMP, vitamin B12, Folate, iron/TIBC, ferritin, reticulocytes, fecal occult, blood smear, TSH,  LDH,  monoclonal gammopathy evaluation.     Orders Placed This Encounter  Procedures  . CBC with Differential/Platelet    Standing Status:   Future    Number of Occurrences:   1    Standing Expiration Date:   11/01/2021  . Technologist smear review    Standing Status:   Future    Number of Occurrences:   1    Standing Expiration Date:   11/01/2021  . Multiple Myeloma Panel (SPEP&IFE w/QIG)    Standing Status:   Future    Number of Occurrences:   1    Standing Expiration Date:   11/01/2021  . Kappa/lambda light chains    Standing Status:   Future    Number of Occurrences:   1    Standing Expiration Date:   11/01/2021  . Retic Panel    Standing Status:   Future    Number of Occurrences:   1    Standing Expiration Date:   11/01/2021  . Ferritin    Standing Status:   Future    Number of Occurrences:   1    Standing Expiration Date:   05/03/2021  . Iron and TIBC    Standing Status:   Future    Number of Occurrences:   1    Standing Expiration Date:   11/01/2021  . Folate    Standing Status:   Future    Number of Occurrences:   1    Standing Expiration Date:   11/01/2021  . Vitamin B12    Standing Status:   Future    Number of Occurrences:   1    Standing Expiration Date:   11/01/2021  . TSH    Standing Status:   Future    Number of Occurrences:   1    Standing Expiration Date:   11/01/2021  . Lactate dehydrogenase    Standing Status:   Future    Number of Occurrences:   1    Standing Expiration Date:   11/01/2021    All questions were answered. The patient knows to call the clinic with any problems questions or concerns. CcHolley Raring, [place], MD  Return of visit: 2 weeks Thank you for this kind referral and the opportunity to participate in the care of this patient. A copy of today's note is routed to referring provider    Earlie Server, MD,  PhD 11/01/2020

## 2020-11-01 NOTE — Telephone Encounter (Signed)
2:00PM: Palliative care SW returned call to patient - from [facility] earlier today.  Call was unsuccessful. SW left HIPPA compliant VM. Awaiting return call.

## 2020-11-01 NOTE — Telephone Encounter (Signed)
940AM: Palliative care SW outreached patient per NP request for psych referral assessment and  Assistance.  Call Unsuccessful. SW left confidential VM on patients mobile number [phone]. Patients home number [phone] is disconnected.  Awaiting return call.

## 2020-11-01 NOTE — ED Provider Notes (Signed)
Accepted care of this patient from Dr. Charlsie Quest at 45 PM.  60 year old coming in with abdominal pain.  Plan was to reevaluate after CT and UA.  The CT was concerning for may be some early signs of right pyelonephritis.  Her UA looks clean but I was sent that to culture.  She did receive a dose of Rocephin and was discharged home on Keflex.  Her blood work showed mildly elevated lipase.  She is tender in the epigastric region.  Her presentation could be due to mild pancreatitis.  Gallbladder looks normal and so does the appendix on CT.  Pain was well controlled with p.o. oxycodone.  We discussed pain control, nausea control with Percocet and Zofran.  Bland diet for pancreatitis.  Close follow-up with PCP.  And my standard return precautions.     I have personally reviewed the images performed during this visit and I agree with the Radiologist's read.   Interpretation by Radiologist:  CT ABDOMEN PELVIS W WO CONTRAST  Result Date: 10/31/2020 CLINICAL DATA:  Left-sided flank pain EXAM: CT ABDOMEN AND PELVIS WITHOUT AND WITH CONTRAST TECHNIQUE: Multidetector CT imaging of the abdomen and pelvis was performed following the standard protocol before and following the bolus administration of intravenous contrast. CONTRAST:  33mL OMNIPAQUE IOHEXOL 300 MG/ML  SOLN COMPARISON:  CT 08/26/2020 FINDINGS: Lower chest: Lung bases demonstrate no acute consolidation or pleural effusion. Hepatobiliary: No focal liver abnormality is seen. No gallstones, gallbladder wall thickening, or biliary dilatation. Pancreas: Unremarkable. No pancreatic ductal dilatation or surrounding inflammatory changes. Spleen: Normal in size without focal abnormality. Adrenals/Urinary Tract: Adrenal glands are normal. Non contrasted images of the kidneys show no hydronephrosis. Negative for ureteral stone. Slightly dense exophytic lesion at the midpole of the left kidney measuring 17 mm without significant enhancement. Simple cyst mid posterior  cortex of left kidney. Delayed images demonstrate patchy hypoenhancement at the lower pole of the right kidney. Stomach/Bowel: Stomach is within normal limits. Appendix appears normal. No evidence of bowel wall thickening, distention, or inflammatory changes. Vascular/Lymphatic: Moderate aortic atherosclerosis. No aneurysm. No suspicious nodes. Reproductive: Status post hysterectomy. No adnexal masses. Other: Negative for free air or free fluid. Musculoskeletal: No acute or significant osseous findings. IMPRESSION: 1. Negative for hydronephrosis or ureteral stone. 2. Patchy hypoenhancement at the lower pole of the right kidney on delayed images, question pyelonephritis. Correlate with urinalysis. 3. 17 mm slightly dense exophytic lesion at the midpole of the left kidney without significant enhancement, likely a hemorrhagic or proteinaceous cyst. Aortic Atherosclerosis (ICD10-I70.0). Electronically Signed   By: Donavan Foil M.D.   On: 10/31/2020 23:14      Rudene Re, MD 11/01/20 0200

## 2020-11-02 LAB — KAPPA/LAMBDA LIGHT CHAINS
Kappa free light chain: 30.7 mg/L — ABNORMAL HIGH (ref 3.3–19.4)
Kappa, lambda light chain ratio: 1.68 — ABNORMAL HIGH (ref 0.26–1.65)
Lambda free light chains: 18.3 mg/L (ref 5.7–26.3)

## 2020-11-03 LAB — URINE CULTURE: Culture: 10000 — AB

## 2020-11-04 ENCOUNTER — Encounter: Payer: Self-pay | Admitting: Oncology

## 2020-11-04 NOTE — Progress Notes (Signed)
ED Antimicrobial Stewardship Positive Culture Follow Up   Stacy Pearson is an 60 y.o. female who presented to [facility] on 10/31/2020 with a chief complaint of abdominal pain and left-sided plank pain. Spoke with pt to stop taking current antibiotic and switch to ciprofloxacin.    Chief Complaint  Patient presents with  . Abdominal Pain    Recent Results (from the past 720 hour(s))  Resp Panel by RT-PCR (Flu A&B, Covid) Nasopharyngeal Swab     Status: None   Collection Time: 10/05/20 11:45 AM   Specimen: Nasopharyngeal Swab; Nasopharyngeal(NP) swabs in vial transport medium  Result Value Ref Range Status   SARS Coronavirus 2 by RT PCR NEGATIVE NEGATIVE Final    Comment: (NOTE) SARS-CoV-2 target nucleic acids are NOT DETECTED.  The SARS-CoV-2 RNA is generally detectable in upper respiratory specimens during the acute phase of infection. The lowest concentration of SARS-CoV-2 viral copies this assay can detect is 138 copies/mL. A negative result does not preclude SARS-Cov-2 infection and should not be used as the sole basis for treatment or other patient management decisions. A negative result may occur with  improper specimen collection/handling, submission of specimen other than nasopharyngeal swab, presence of viral mutation(s) within the areas targeted by this assay, and inadequate number of viral copies(<138 copies/mL). A negative result must be combined with clinical observations, patient history, and epidemiological information. The expected result is Negative.  Fact Sheet for Patients:  EntrepreneurPulse.com.au  Fact Sheet for Healthcare Providers:  IncredibleEmployment.be  This test is no t yet approved or cleared by the [place] FDA and  has been authorized for detection and/or diagnosis of SARS-CoV-2 by FDA under an Emergency Use Authorization (EUA). This EUA will remain  in effect (meaning this test can be used) for the  duration of the COVID-19 declaration under Section 564(b)(1) of the Act, 21 U.S.C.section 360bbb-3(b)(1), unless the authorization is terminated  or revoked sooner.       Influenza A by PCR NEGATIVE NEGATIVE Final   Influenza B by PCR NEGATIVE NEGATIVE Final    Comment: (NOTE) The Xpert Xpress SARS-CoV-2/FLU/RSV plus assay is intended as an aid in the diagnosis of influenza from Nasopharyngeal swab specimens and should not be used as a sole basis for treatment. Nasal washings and aspirates are unacceptable for Xpert Xpress SARS-CoV-2/FLU/RSV testing.  Fact Sheet for Patients: EntrepreneurPulse.com.au  Fact Sheet for Healthcare Providers: IncredibleEmployment.be  This test is not yet approved or cleared by the [place] FDA and has been authorized for detection and/or diagnosis of SARS-CoV-2 by FDA under an Emergency Use Authorization (EUA). This EUA will remain in effect (meaning this test can be used) for the duration of the COVID-19 declaration under Section 564(b)(1) of the Act, 21 U.S.C. section 360bbb-3(b)(1), unless the authorization is terminated or revoked.  Performed at [facility], [address], [place], [place] [address]   Urine culture     Status: Abnormal   Collection Time: 10/31/20  9:58 PM   Specimen: Urine, Random  Result Value Ref Range Status   Specimen Description   Final    URINE, RANDOM Performed at [facility], [address], [place], [place] [address]    Special Requests   Final    NONE Performed at [facility], [place], [place] [address]    Culture 10,000 COLONIES/mL STAPHYLOCOCCUS EPIDERMIDIS (A)  Final   Report Status 11/03/2020 FINAL  Final   Organism ID, Bacteria STAPHYLOCOCCUS EPIDERMIDIS (A)  Final  Susceptibility   Staphylococcus epidermidis - MIC*    CIPROFLOXACIN <=0.5 SENSITIVE Sensitive     GENTAMICIN <=0.5 SENSITIVE Sensitive      NITROFURANTOIN <=16 SENSITIVE Sensitive     OXACILLIN >=4 RESISTANT Resistant     TETRACYCLINE 2 SENSITIVE Sensitive     VANCOMYCIN 1 SENSITIVE Sensitive     TRIMETH/SULFA <=10 SENSITIVE Sensitive     CLINDAMYCIN >=8 RESISTANT Resistant     RIFAMPIN <=0.5 SENSITIVE Sensitive     Inducible Clindamycin NEGATIVE Sensitive     * 10,000 COLONIES/mL STAPHYLOCOCCUS EPIDERMIDIS    [x]  Treated with cephalexin, organism resistant to prescribed antimicrobial  New antibiotic prescription: Ciprofloxacin 500mg  Q12h x7 days  ED Provider: Dr. Waldon Merl, PharmD Pharmacy Resident  11/04/2020 10:03 AM

## 2020-11-05 LAB — MULTIPLE MYELOMA PANEL, SERUM
Albumin SerPl Elph-Mcnc: 3.8 g/dL (ref 2.9–4.4)
Albumin/Glob SerPl: 1.2 (ref 0.7–1.7)
Alpha 1: 0.3 g/dL (ref 0.0–0.4)
Alpha2 Glob SerPl Elph-Mcnc: 0.9 g/dL (ref 0.4–1.0)
B-Globulin SerPl Elph-Mcnc: 1.1 g/dL (ref 0.7–1.3)
Gamma Glob SerPl Elph-Mcnc: 1 g/dL (ref 0.4–1.8)
Globulin, Total: 3.3 g/dL (ref 2.2–3.9)
IgA: 233 mg/dL (ref 87–352)
IgG (Immunoglobin G), Serum: 1073 mg/dL (ref 586–1602)
IgM (Immunoglobulin M), Srm: 155 mg/dL (ref 26–217)
Total Protein ELP: 7.1 g/dL (ref 6.0–8.5)

## 2020-11-13 ENCOUNTER — Telehealth: Payer: Self-pay

## 2020-11-13 NOTE — Telephone Encounter (Signed)
11/13/20 @ 1130AM : Palliative care SW returned patients TC, inquiring about mental health services referral.  Call unsuccessful. SW unable to LVM due to mailbox being full.

## 2020-11-13 NOTE — Progress Notes (Signed)
Mental Health referral was made for patient on 11/01/20 to [facility].   [facility] Update  for [facility]  [place] Nov 12 2020 at 1:00pm A [facility] team member sent an SMS and/or email to inform [place]  that a match has been made to [facility] [employer] [facility] [facility]. [place]  was given the provider's contact information. [facility] will follow up to offer [place]  further support if needed.

## 2020-11-15 ENCOUNTER — Inpatient Hospital Stay: Payer: Medicare HMO | Attending: Oncology | Admitting: Oncology

## 2020-11-15 ENCOUNTER — Other Ambulatory Visit: Payer: Self-pay

## 2020-11-15 ENCOUNTER — Encounter: Payer: Self-pay | Admitting: Oncology

## 2020-11-15 VITALS — BP 124/84 | HR 89 | Temp 98.1°F | Resp 18 | Wt 124.6 lb

## 2020-11-15 DIAGNOSIS — R5383 Other fatigue: Secondary | ICD-10-CM | POA: Insufficient documentation

## 2020-11-15 DIAGNOSIS — Z803 Family history of malignant neoplasm of breast: Secondary | ICD-10-CM | POA: Insufficient documentation

## 2020-11-15 DIAGNOSIS — G20 Parkinson's disease: Secondary | ICD-10-CM | POA: Insufficient documentation

## 2020-11-15 DIAGNOSIS — Z800 Family history of malignant neoplasm of digestive organs: Secondary | ICD-10-CM | POA: Diagnosis not present

## 2020-11-15 DIAGNOSIS — Z87891 Personal history of nicotine dependence: Secondary | ICD-10-CM | POA: Insufficient documentation

## 2020-11-15 DIAGNOSIS — K219 Gastro-esophageal reflux disease without esophagitis: Secondary | ICD-10-CM | POA: Insufficient documentation

## 2020-11-15 DIAGNOSIS — G629 Polyneuropathy, unspecified: Secondary | ICD-10-CM | POA: Insufficient documentation

## 2020-11-15 DIAGNOSIS — D631 Anemia in chronic kidney disease: Secondary | ICD-10-CM | POA: Insufficient documentation

## 2020-11-15 DIAGNOSIS — I129 Hypertensive chronic kidney disease with stage 1 through stage 4 chronic kidney disease, or unspecified chronic kidney disease: Secondary | ICD-10-CM | POA: Diagnosis not present

## 2020-11-15 DIAGNOSIS — N1831 Chronic kidney disease, stage 3a: Secondary | ICD-10-CM | POA: Diagnosis not present

## 2020-11-15 DIAGNOSIS — Z79899 Other long term (current) drug therapy: Secondary | ICD-10-CM | POA: Insufficient documentation

## 2020-11-15 DIAGNOSIS — I10 Essential (primary) hypertension: Secondary | ICD-10-CM | POA: Insufficient documentation

## 2020-11-15 DIAGNOSIS — J449 Chronic obstructive pulmonary disease, unspecified: Secondary | ICD-10-CM | POA: Insufficient documentation

## 2020-11-15 DIAGNOSIS — Z85828 Personal history of other malignant neoplasm of skin: Secondary | ICD-10-CM | POA: Insufficient documentation

## 2020-11-15 NOTE — Progress Notes (Signed)
Hematology/Oncology Consult note [facility] Telephone:([phone] [phone]   Patient Care Team: Valera Castle, MD as PCP - General (Family Medicine)  REFERRING PROVIDER: Valera Castle, *  CHIEF COMPLAINTS/REASON FOR VISIT:  Evaluation of anemia  HISTORY OF PRESENTING ILLNESS:  Stacy Pearson is a  60 y.o.  female with PMH listed below who was referred to me for evaluation of anemia Reviewed patient's recent labs that was done.  Labs revealed anemia with hemoglobin of .   Reviewed patient's previous labs ordered by primary care physician's office, anemia is chronic onset , duration is since 2007  Patient was hospitalized in February 2022 due to severe sepsis, septic shock, acute respiratory failure altered mental status, acute kidney injury due to ATN and was on hemodialysis.  Currently she is off hemodialysis and kidney function has recovered.  Stage III chronic kidney disease.  She was accompanied by her mother today for evaluation of anemia.  Reports feeling tired, seeing stars occasionally. Patient has Parkinson's disease. Denies any black or bloody stool, hematuria, epigastric pain.    INTERVAL HISTORY Stacy Pearson is a 60 y.o. female who has above history reviewed by me today presents for follow up visit for Anemia Problems and complaints are listed below: Patient had a blood work done and presents to discuss results.  She reports that her fatigue has improved.  Review of Systems  Constitutional: Negative for appetite change, chills, fatigue and fever.  HENT:   Negative for hearing loss and voice change.   Eyes: Negative for eye problems.  Respiratory: Negative for chest tightness and cough.   Cardiovascular: Negative for chest pain.  Gastrointestinal: Negative for abdominal distention, abdominal pain and blood in stool.  Endocrine: Negative for hot flashes.  Genitourinary: Negative for difficulty urinating and  frequency.   Musculoskeletal: Negative for arthralgias.  Skin: Negative for itching and rash.  Neurological: Negative for extremity weakness.  Hematological: Negative for adenopathy.  Psychiatric/Behavioral: Negative for confusion.     MEDICAL HISTORY:  Past Medical History:  Diagnosis Date  . Abdominal pain   . Allergic genetic state   . Anxiety   . Asthma   . Basal cell carcinoma   . Bipolar disorder ([place])   . Cancer ([place])    MOS surgery basal cell face  . COPD (chronic obstructive pulmonary disease) ([place])   . Depression   . Eating disorder   . Eczema   . Encounter for blood transfusion    plasma transfusion  . Essential tremor   . 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 GERD (gastroesophageal reflux disease)   . Headache   . History of basal cell carcinoma   . Hypertension   . Neuropathy   . Parkinson's disease ([place])   . Parkinson's disease ([place])   . Parkinsonism [facility])     SURGICAL HISTORY: Past Surgical History:  Procedure Laterality Date  . ABDOMINAL HYSTERECTOMY    . ADENOIDECTOMY    . BLADDER SURGERY    . bone grafts    . CATARACT EXTRACTION W/ INTRAOCULAR LENS IMPLANT    . CERVICAL FUSION    . COLONOSCOPY    . COLONOSCOPY WITH ESOPHAGOGASTRODUODENOSCOPY (EGD)    . COLONOSCOPY WITH PROPOFOL N/A 04/28/2018   Procedure: COLONOSCOPY WITH PROPOFOL;  Surgeon: Manya Silvas, MD;  Location: [facility] ENDOSCOPY;  Service: Endoscopy;  Laterality: N/A;  . DIALYSIS/PERMA CATHETER INSERTION Right 09/04/2020   Procedure: DIALYSIS/PERMA CATHETER INSERTION;  Surgeon: Katha Cabal,  MD;  Location: [facility] CV LAB;  Service: Cardiovascular;  Laterality: Right;  . DIALYSIS/PERMA CATHETER REMOVAL Left 10/05/2020   Procedure: DIALYSIS/PERMA CATHETER REMOVAL;  Surgeon: Algernon Huxley, MD;  Location: [facility] CV LAB;  Service: Cardiovascular;  Laterality: Left;  . EYE SURGERY    . GRAFT APPLICATION     in neck  . LAPAROSCOPIC  BILATERAL SALPINGO OOPHERECTOMY    . NEUROPLASTY / TRANSPOSITION ULNAR NERVE AT ELBOW    . OOPHORECTOMY    . SKIN CANCER EXCISION    . skin grafts   left forearm; hand bil.lower extremeties  . titanium plate    . TONSILLECTOMY    . TUBAL LIGATION      SOCIAL HISTORY: Social History   Socioeconomic History  . Marital status: Single    Spouse name: Not on file  . Number of children: Not on file  . Years of education: Not on file  . Highest education level: Not on file  Occupational History  . Not on file  Tobacco Use  . Smoking status: Former Smoker    Packs/day: 1.00    Years: 20.00    Pack years: 20.00    Types: Cigarettes    Quit date: 07/14/2006    Years since quitting: 14.3  . Smokeless tobacco: Never Used  Vaping Use  . Vaping Use: Every day  Substance and Sexual Activity  . Alcohol use: Never  . Drug use: Never  . Sexual activity: Not on file  Other Topics Concern  . Not on file  Social History Narrative  . Not on file   Social Determinants of Health   Financial Resource Strain: Not on file  Food Insecurity: Not on file  Transportation Needs: Not on file  Physical Activity: Not on file  Stress: Not on file  Social Connections: Not on file  Intimate Partner Violence: Not on file    FAMILY HISTORY: Family History  Problem Relation Age of Onset  . Breast cancer Mother   . Colon cancer Mother   . Arthritis Mother   . Lung cancer Father     ALLERGIES:  is allergic to codeine, other, and prednisone.  MEDICATIONS:  Current Outpatient Medications  Medication Sig Dispense Refill  . amLODipine (NORVASC) 10 MG tablet Take 1 tablet (10 mg total) by mouth daily. 30 tablet 1  . carbidopa-levodopa (SINEMET IR) 25-100 MG tablet Take 1 tablet by mouth 3 (three) times daily. 90 tablet 1  . EPINEPHrine 0.3 mg/0.3 mL IJ SOAJ injection Inject into the muscle once.    . Ipratropium-Albuterol (COMBIVENT) 20-100 MCG/ACT AERS respimat Inhale 1 puff into the lungs every  6 (six) hours.    . multivitamin (RENA-VIT) TABS tablet Take 1 tablet by mouth at bedtime. 30 tablet 0  . ondansetron (ZOFRAN ODT) 4 MG disintegrating tablet Take 1 tablet (4 mg total) by mouth every 8 (eight) hours as needed. 20 tablet 0  . vitamin B-12 1000 MCG tablet Take 1 tablet (1,000 mcg total) by mouth daily. 30 tablet 0  . DULoxetine (CYMBALTA) 60 MG capsule Take by mouth. (Patient not taking: No sig reported)    . hydrALAZINE (APRESOLINE) 50 MG tablet Take 1 tablet (50 mg total) by mouth 2 (two) times daily. (Patient not taking: Reported on 11/15/2020) 60 tablet 2  . lamoTRIgine (LAMICTAL) 25 MG tablet Take 50 mg by mouth at bedtime. (Patient not taking: No sig reported)    . oxyCODONE-acetaminophen (PERCOCET) 5-325 MG tablet Take 1 tablet by mouth  every 4 (four) hours as needed. (Patient not taking: Reported on 11/15/2020) 12 tablet 0  . propranolol ER (INDERAL LA) 60 MG 24 hr capsule Take 60 mg by mouth daily. (Patient not taking: Reported on 11/15/2020)    . sevelamer carbonate (RENVELA) 800 MG tablet Take 1 tablet (800 mg total) by mouth 3 (three) times daily with meals. (Patient not taking: No sig reported) 90 tablet 2   No current facility-administered medications for this visit.     PHYSICAL EXAMINATION: ECOG PERFORMANCE STATUS: 1 - Symptomatic but completely ambulatory Vitals:   11/15/20 1017  BP: 124/84  Pulse: 89  Resp: 18  Temp: 98.1 F (36.7 C)   Filed Weights   11/15/20 1017  Weight: 124 lb 9.6 oz (56.5 kg)    Physical Exam Constitutional:      General: She is not in acute distress. HENT:     Head: Normocephalic and atraumatic.  Eyes:     General: No scleral icterus. Cardiovascular:     Rate and Rhythm: Normal rate and regular rhythm.     Heart sounds: Normal heart sounds.  Pulmonary:     Effort: Pulmonary effort is normal. No respiratory distress.     Breath sounds: No wheezing.  Abdominal:     General: Bowel sounds are normal. There is no distension.      Palpations: Abdomen is soft.  Musculoskeletal:        General: No deformity. Normal range of motion.     Cervical back: Normal range of motion and neck supple.  Skin:    General: Skin is warm and dry.     Findings: No erythema or rash.  Neurological:     Mental Status: She is alert and oriented to person, place, and time. Mental status is at baseline.     Cranial Nerves: No cranial nerve deficit.     Coordination: Coordination normal.  Psychiatric:        Mood and Affect: Mood normal.      LABORATORY DATA:  I have reviewed the data as listed Lab Results  Component Value Date   WBC 6.4 11/01/2020   HGB 11.8 (L) 11/01/2020   HCT 36.8 11/01/2020   MCV 97.9 11/01/2020   PLT 245 11/01/2020   Recent Labs    08/30/20 1000 08/31/20 0911 09/03/20 2054 09/04/20 0509 09/06/20 1522 09/09/20 0436 10/31/20 2158  NA 143   < > 140   < > 138 131* 137  K 4.1   < > 5.0   < > 3.9 3.3* 3.8  CL 101   < > 100   < > 98 94* 102  CO2 22   < > 18*   < > 27 23 28   GLUCOSE 80   < > 85   < > 127* 72 104*  BUN 77*   < > 72*   < > 58* 20 11  CREATININE 7.49*   < > 9.14*   < > 6.83* 4.10* 1.18*  CALCIUM 8.8*   < > 8.9   < > 8.6* 8.3* 9.4  GFRNONAA 6*   < > 5*   < > 6* 12* 53*  PROT 6.5  --  7.0  --   --   --  7.2  ALBUMIN 2.7*  2.7*   < > 2.7*  --  3.0*  --  3.9  AST 20  --  11*  --   --   --  25  ALT <5  --  <  5  --   --   --  11  ALKPHOS 75  --  72  --   --   --  92  BILITOT 0.6  --  1.2  --   --   --  0.5  BILIDIR <0.1  --   --   --   --   --   --   IBILI NOT CALCULATED  --   --   --   --   --   --    < > = values in this interval not displayed.   Iron/TIBC/Ferritin/ %Sat    Component Value Date/Time   IRON 84 11/01/2020 1014   TIBC 398 11/01/2020 1014   FERRITIN 152 11/01/2020 1014   IRONPCTSAT 21 11/01/2020 1014   IRONPCTSAT 19 (L) 01/15/2006 1305        ASSESSMENT & PLAN:  1. Anemia in stage 3a chronic kidney disease (HCC)    Anemia in stage III kidney disease Labs  reviewed and discussed with patient. CBC showed improvement of hemoglobin to 11.8 Normal LDH, TSH, vitamin B12, folate and iron panel.  SPEP showed negative M protein.  Free light chain ratio 1.68, mildly elevated.  Possibly due to the chronic kidney disease. Discussed with patient that I think her anemia is secondary to chronic kidney disease that has improved as her kidney function improves. No need for intervention at this point.  Iron panel is adequate. I will discharge patient to continue follow-up with her primary care provider and nephrologist.  She agrees with the plan   All questions were answered. The patient knows to call the clinic with any problems questions or concerns. Cc. Valera Castle, *  Thank you for this kind referral and the opportunity to participate in the care of this patient. A copy of today's note is routed to referring provider    Earlie Server, MD, PhD 11/15/2020

## 2020-11-15 NOTE — Progress Notes (Signed)
Patient denies new problems/concerns today.   °

## 2020-12-28 ENCOUNTER — Emergency Department
Admission: EM | Admit: 2020-12-28 | Discharge: 2020-12-28 | Disposition: A | Discharge status: Home / Self Care | Payer: Medicare HMO | Attending: Emergency Medicine | Admitting: Emergency Medicine

## 2020-12-28 ENCOUNTER — Other Ambulatory Visit: Payer: Self-pay

## 2020-12-28 ENCOUNTER — Emergency Department: Payer: Medicare HMO

## 2020-12-28 ENCOUNTER — Encounter: Payer: Self-pay | Admitting: Emergency Medicine

## 2020-12-28 DIAGNOSIS — G20 Parkinson's disease: Secondary | ICD-10-CM | POA: Insufficient documentation

## 2020-12-28 DIAGNOSIS — R109 Unspecified abdominal pain: Secondary | ICD-10-CM | POA: Diagnosis not present

## 2020-12-28 DIAGNOSIS — I10 Essential (primary) hypertension: Secondary | ICD-10-CM | POA: Diagnosis not present

## 2020-12-28 DIAGNOSIS — Z85828 Personal history of other malignant neoplasm of skin: Secondary | ICD-10-CM | POA: Diagnosis not present

## 2020-12-28 DIAGNOSIS — Z87891 Personal history of nicotine dependence: Secondary | ICD-10-CM | POA: Diagnosis not present

## 2020-12-28 DIAGNOSIS — Z79899 Other long term (current) drug therapy: Secondary | ICD-10-CM | POA: Insufficient documentation

## 2020-12-28 DIAGNOSIS — J45909 Unspecified asthma, uncomplicated: Secondary | ICD-10-CM | POA: Diagnosis not present

## 2020-12-28 DIAGNOSIS — J449 Chronic obstructive pulmonary disease, unspecified: Secondary | ICD-10-CM | POA: Diagnosis not present

## 2020-12-28 LAB — URINALYSIS, COMPLETE (UACMP) WITH MICROSCOPIC
Bacteria, UA: NONE SEEN
Bilirubin Urine: NEGATIVE
Glucose, UA: NEGATIVE mg/dL
Hgb urine dipstick: NEGATIVE
Ketones, ur: NEGATIVE mg/dL
Leukocytes,Ua: NEGATIVE
Nitrite: NEGATIVE
Protein, ur: NEGATIVE mg/dL
Specific Gravity, Urine: 1.003 — ABNORMAL LOW (ref 1.005–1.030)
Squamous Epithelial / HPF: NONE SEEN (ref 0–5)
WBC, UA: NONE SEEN WBC/hpf (ref 0–5)
pH: 8 (ref 5.0–8.0)

## 2020-12-28 LAB — CBC WITH DIFFERENTIAL/PLATELET
Abs Immature Granulocytes: 0.01 10*3/uL (ref 0.00–0.07)
Basophils Absolute: 0.1 10*3/uL (ref 0.0–0.1)
Basophils Relative: 1 %
Eosinophils Absolute: 0.1 10*3/uL (ref 0.0–0.5)
Eosinophils Relative: 3 %
HCT: 36.7 % (ref 36.0–46.0)
Hemoglobin: 12.1 g/dL (ref 12.0–15.0)
Immature Granulocytes: 0 %
Lymphocytes Relative: 43 %
Lymphs Abs: 2.1 10*3/uL (ref 0.7–4.0)
MCH: 30.4 pg (ref 26.0–34.0)
MCHC: 33 g/dL (ref 30.0–36.0)
MCV: 92.2 fL (ref 80.0–100.0)
Monocytes Absolute: 0.4 10*3/uL (ref 0.1–1.0)
Monocytes Relative: 8 %
Neutro Abs: 2.1 10*3/uL (ref 1.7–7.7)
Neutrophils Relative %: 45 %
Platelets: 421 10*3/uL — ABNORMAL HIGH (ref 150–400)
RBC: 3.98 MIL/uL (ref 3.87–5.11)
RDW: 12.7 % (ref 11.5–15.5)
WBC: 4.8 10*3/uL (ref 4.0–10.5)
nRBC: 0 % (ref 0.0–0.2)

## 2020-12-28 LAB — HEPATIC FUNCTION PANEL
ALT: 12 U/L (ref 0–44)
AST: 28 U/L (ref 15–41)
Albumin: 4.3 g/dL (ref 3.5–5.0)
Alkaline Phosphatase: 82 U/L (ref 38–126)
Bilirubin, Direct: <0.1 mg/dL (ref 0.0–0.2)
Total Bilirubin: 0.4 mg/dL (ref 0.3–1.2)
Total Protein: 7.5 g/dL (ref 6.5–8.1)

## 2020-12-28 LAB — BASIC METABOLIC PANEL
Anion gap: 7 (ref 5–15)
BUN: 11 mg/dL (ref 6–20)
CO2: 29 mmol/L (ref 22–32)
Calcium: 9.9 mg/dL (ref 8.9–10.3)
Chloride: 103 mmol/L (ref 98–111)
Creatinine, Ser: 0.84 mg/dL (ref 0.44–1.00)
GFR, Estimated: >60 mL/min (ref >60)
Glucose, Bld: 99 mg/dL (ref 70–99)
Potassium: 4.3 mmol/L (ref 3.5–5.1)
Sodium: 139 mmol/L (ref 135–145)

## 2020-12-28 LAB — LIPASE, BLOOD: Lipase: 39 U/L (ref 11–51)

## 2020-12-28 MED ORDER — TRAMADOL HCL 50 MG PO TABS: 50.0000 mg | ORAL_TABLET | Freq: Four times a day (QID) | ORAL | 0 refills | Status: AC | PRN

## 2020-12-28 MED ORDER — OXYCODONE-ACETAMINOPHEN 5-325 MG PO TABS
1.0000 | ORAL_TABLET | Freq: Once | ORAL | Status: AC
  Administered 2020-12-28: 1 via ORAL
  Filled 2020-12-28: qty 1

## 2020-12-28 MED ORDER — MORPHINE SULFATE (PF) 4 MG/ML IV SOLN
4.0000 mg | Freq: Once | INTRAVENOUS | Status: AC
  Administered 2020-12-28: 4 mg via INTRAVENOUS
  Filled 2020-12-28: qty 1

## 2020-12-28 NOTE — ED Notes (Signed)
Medication administered as ordered at this time. Pt transported to CT. Pt states has a ride home.

## 2020-12-28 NOTE — ED Notes (Signed)
Pt can be heard whimpering from room, this RN to bedside to assess, pt whimpering, no tears noted, VSS, this RN spoke with EDP regarding patient c/o continued pain and patient receiving percocet approx 30 mins ago, explained to patient PO meds have not had a chance to kick in fully and awaiting rest of labs to result. Pt states understanding, provided with tissues per her request.

## 2020-12-28 NOTE — ED Notes (Signed)
Pt with noted "tremors" while staff not at bedside, upon staff arrival to bedside "tremors" immediately stop. Pt able to hold complete conversation at this time. NAD noted.

## 2020-12-28 NOTE — ED Provider Notes (Signed)
[facility] Emergency Department Provider Note ____________________________________________   Event Date/Time   First MD Initiated Contact with Patient 12/28/20 [phone]     (approximate)  I have reviewed the triage vital signs and the nursing notes.   HISTORY  Chief Complaint Flank Pain    HPI Stacy Pearson is a 60 y.o. female with PMH as noted below including chronic kidney disease (formerly on dialysis but now no longer), asthma/COPD, hypertension, and Parkinson's who presents with bilateral flank pain, cute onset yesterday, persistent course since then, described as cramps.  She denies any dysuria, hematuria, or frequency.  She has no vomiting or diarrhea, and denies fever, however she has had some chills.  She states that this feels similar to the episode when she last came to the ED about 2 months ago.  Past Medical History:  Diagnosis Date   Abdominal pain    Allergic genetic state    Anxiety    Asthma    Basal cell carcinoma    Bipolar disorder (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[place])    GERD (gastroesophageal reflux disease)    Headache    History of basal cell carcinoma    Hypertension    Neuropathy    Parkinson's disease ([place])    Parkinson's disease ([place])    Parkinsonism ([place])     Patient Active Problem List   Diagnosis Date Noted   Epilepsy ([place]) 08/26/2020   Depression with anxiety 08/26/2020   HTN (hypertension) 53/97/6734   Acute metabolic encephalopathy 19/37/9024   UTI (urinary tract infection) 08/26/2020   AKI (acute kidney injury) ([place]) 08/26/2020   Severe sepsis with septic shock ([place]) 08/26/2020   Delirium 08/26/2020   Acute diarrhea 03/08/2018   Status post  cervical spinal fusion 12/31/2017   Cervical spondylosis with radiculopathy 10/28/2017   Colon polyp 10/08/2017   Essential tremor 10/08/2017   Cervical radiculopathy 09/03/2017   Allergic rhinitis 12/30/2016   Sting of hornets, wasps, and bees causing poisoning and toxic reactions 12/30/2016   Allergic rhinitis due to dogs 12/17/2016   Asthma, currently active 12/17/2016   Muscle cramps 11/18/2016   Sleep disorder 08/25/2016   At risk for falls 08/19/2016   Elevated vitamin B12 level 08/19/2016   History of fall within past 90 days 08/19/2016   Nausea and vomiting 08/19/2016   Parkinson's disease ([place]) 08/19/2016   Diaphoresis 03/25/2016   Dry scalp 03/25/2016   Dry skin 03/25/2016   Back pain 07/04/2015   Tremor 01/02/2014   Asthma 07/28/2013   COPD (chronic obstructive pulmonary disease) ([place]) 07/28/2013   Ulnar neuropathy of left upper extremity 06/06/2013   Meibomian gland dysfunction 11/09/2012   Raynaud phenomenon 05/03/2012   Anorexia nervosa with bulimia 01/17/2011   Basal cell carcinoma of other specified sites of skin 01/17/2011   Dyspepsia and other specified disorders of function of stomach 01/17/2011   Generalized anxiety disorder 01/17/2011   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   Migraine without aura 01/17/2011   Other atopic dermatitis and related conditions 01/17/2011   Bipolar 1 disorder ([place]) 04/28/1995    Past Surgical History:  Procedure Laterality Date   ABDOMINAL HYSTERECTOMY  ADENOIDECTOMY     BLADDER SURGERY     bone grafts     CATARACT EXTRACTION W/ INTRAOCULAR LENS IMPLANT     CERVICAL FUSION     COLONOSCOPY     COLONOSCOPY WITH ESOPHAGOGASTRODUODENOSCOPY (EGD)     COLONOSCOPY WITH PROPOFOL N/A 04/28/2018   Procedure: COLONOSCOPY WITH PROPOFOL;  Surgeon: Manya Silvas, MD;  Location: [facility] ENDOSCOPY;  Service: Endoscopy;  Laterality: N/A;   DIALYSIS/PERMA CATHETER INSERTION Right 09/04/2020   Procedure: DIALYSIS/PERMA  CATHETER INSERTION;  Surgeon: Katha Cabal, MD;  Location: [place] CV LAB;  Service: Cardiovascular;  Laterality: Right;   DIALYSIS/PERMA CATHETER REMOVAL Left 10/05/2020   Procedure: DIALYSIS/PERMA CATHETER REMOVAL;  Surgeon: Algernon Huxley, MD;  Location: [place] CV LAB;  Service: Cardiovascular;  Laterality: Left;   EYE SURGERY     GRAFT APPLICATION     in neck   LAPAROSCOPIC BILATERAL SALPINGO OOPHERECTOMY     NEUROPLASTY / TRANSPOSITION ULNAR NERVE AT ELBOW     OOPHORECTOMY     SKIN CANCER EXCISION     skin grafts   left forearm; hand bil.lower extremeties   titanium plate     TONSILLECTOMY     TUBAL LIGATION      Prior to Admission medications   Medication Sig Start Date End Date Taking? Authorizing Provider  traMADol (ULTRAM) 50 MG tablet Take 1 tablet (50 mg total) by mouth every 6 (six) hours as needed for up to 3 days for severe pain. 12/28/20 12/31/20 Yes Arta Silence, MD  amLODipine (NORVASC) 10 MG tablet Take 1 tablet (10 mg total) by mouth daily. 09/11/20   Fritzi Mandes, MD  carbidopa-levodopa (SINEMET IR) 25-100 MG tablet Take 1 tablet by mouth 3 (three) times daily. 09/10/20   Fritzi Mandes, MD  DULoxetine (CYMBALTA) 60 MG capsule Take by mouth. Patient not taking: No sig reported 05/03/19   [provider]  EPINEPHrine 0.3 mg/0.3 mL IJ SOAJ injection Inject into the muscle once.    [provider]  hydrALAZINE (APRESOLINE) 50 MG tablet Take 1 tablet (50 mg total) by mouth 2 (two) times daily. Patient not taking: Reported on 11/15/2020 09/10/20   Fritzi Mandes, MD  Ipratropium-Albuterol (COMBIVENT) 20-100 MCG/ACT AERS respimat Inhale 1 puff into the lungs every 6 (six) hours.    [provider]  lamoTRIgine (LAMICTAL) 25 MG tablet Take 50 mg by mouth at bedtime. Patient not taking: No sig reported 08/14/20   [provider]  multivitamin (RENA-VIT) TABS tablet Take 1 tablet by mouth at bedtime. 09/10/20   Fritzi Mandes, MD   ondansetron (ZOFRAN ODT) 4 MG disintegrating tablet Take 1 tablet (4 mg total) by mouth every 8 (eight) hours as needed. 11/01/20   Rudene Re, MD  oxyCODONE-acetaminophen (PERCOCET) 5-325 MG tablet Take 1 tablet by mouth every 4 (four) hours as needed. Patient not taking: Reported on 11/15/2020 11/01/20   Rudene Re, MD  propranolol ER (INDERAL LA) 60 MG 24 hr capsule Take 60 mg by mouth daily. Patient not taking: Reported on 11/15/2020 08/14/20   [provider]  sevelamer carbonate (RENVELA) 800 MG tablet Take 1 tablet (800 mg total) by mouth 3 (three) times daily with meals. Patient not taking: No sig reported 09/10/20   Fritzi Mandes, MD  vitamin B-12 1000 MCG tablet Take 1 tablet (1,000 mcg total) by mouth daily. 09/11/20   Fritzi Mandes, MD    Allergies Codeine, Other, and Prednisone  Family History  Problem Relation Age of Onset  Breast cancer Mother    Colon cancer Mother    Arthritis Mother    Lung cancer Father     Social History Social History   Tobacco Use   Smoking status: Former    Packs/day: 1.00    Years: 20.00    Pack years: 20.00    Types: Cigarettes    Quit date: 07/14/2006    Years since quitting: 14.4   Smokeless tobacco: Never  Vaping Use   Vaping Use: Every day  Substance Use Topics   Alcohol use: Never   Drug use: Never    Review of Systems  Constitutional: No fever. Eyes: No redness. ENT: No sore throat. Cardiovascular: Denies chest pain. Respiratory: Denies shortness of breath. Gastrointestinal: No vomiting or diarrhea.  Genitourinary: Negative for dysuria or hematuria.  Musculoskeletal: Negative for back pain. Skin: Negative for rash. Neurological: Negative for headaches, focal weakness or numbness.   ____________________________________________   PHYSICAL EXAM:  VITAL SIGNS: ED Triage Vitals [12/28/20 0931]  Enc Vitals Group     BP 130/74     Pulse Rate 66     Resp 16     Temp 97.9 F (36.6 C)     Temp Source  Oral     SpO2 99 %     Weight 124 lb 9 oz (56.5 kg)     Height 5\' 2"  (1.575 m)     Head Circumference      Peak Flow      Pain Score 10     Pain Loc      Pain Edu?      Excl. in [place]?     Constitutional: Alert and oriented. Well appearing and in no acute distress. Eyes: Conjunctivae are normal.  No scleral icterus. Head: Atraumatic. Nose: No congestion/rhinnorhea. Mouth/Throat: Mucous membranes are dry. Neck: Normal range of motion.  Cardiovascular: Normal rate, regular rhythm. Good peripheral circulation. Respiratory: Normal respiratory effort.  No retractions.  Gastrointestinal: Soft with mild bilateral mid abdominal and flank tenderness.. No distention.  Genitourinary: No CVA tenderness. Musculoskeletal: No lower extremity edema.  Extremities warm and well perfused.  Neurologic:  Normal speech and language. No gross focal neurologic deficits are appreciated.  Skin:  Skin is warm and dry. No rash noted. Psychiatric: Mood and affect are normal. Speech and behavior are normal.  ____________________________________________   LABS (all labs ordered are listed, but only abnormal results are displayed)  Labs Reviewed  CBC WITH DIFFERENTIAL/PLATELET - Abnormal; Notable for the following components:      Result Value   Platelets 421 (*)    All other components within normal limits  URINALYSIS, COMPLETE (UACMP) WITH MICROSCOPIC - Abnormal; Notable for the following components:   Color, Urine COLORLESS (*)    APPearance CLEAR (*)    Specific Gravity, Urine 1.003 (*)    All other components within normal limits  BASIC METABOLIC PANEL  HEPATIC FUNCTION PANEL  LIPASE, BLOOD   ____________________________________________  EKG   ____________________________________________  RADIOLOGY  CT abdomen/pelvis: No acute abnormality  ____________________________________________   PROCEDURES  Procedure(s) performed: No  Procedures  Critical Care performed:  No ____________________________________________   INITIAL IMPRESSION / ASSESSMENT AND PLAN / ED COURSE  Pertinent labs & imaging results that were available during my care of the patient were reviewed by me and considered in my medical decision making (see chart for details).   60 year old female with PMH as noted above including CKD, COPD, hypertension, and Parkinson's presents with bilateral crampy flank and abdominal pain  since yesterday.  I reviewed the past medical records in [place].  The patient was last seen in the ED in April with abdominal pain.  She states that today's presentation is similar.  On that visit, CT showed findings consistent with possible pyelonephritis and the patient was discharged with antibiotics.  On exam currently, the patient is overall well-appearing.  Her vital signs are normal.  She has mild bilateral mid abdominal tenderness but no CVA tenderness and an otherwise normal exam.  Differential includes ureteral stone, recurrent pyelonephritis, cystitis, musculoskeletal pain, gastroenteritis, colitis, diverticulitis.  We will obtain labs, urinalysis, CT abdomen/pelvis, give analgesia, and reassess.  ----------------------------------------- 1:22 PM on 12/28/2020 -----------------------------------------  Lab work-up is entirely within normal limits.  The urinalysis is normal.  CT shows no acute abnormality.  Overall presentation favors musculoskeletal abdominal wall pain or other acute on chronic etiology.  There is no evidence of pyelonephritis, ureteral stone, or GI etiology.  The patient states that she feels much better after receiving some analgesia in the ED.  Given her negative work-up and well appearance, she is stable for discharge home and wants to go home now.  I counseled her on the results of the work-up.  I gave her thorough return precautions and she expressed understanding.  I prescribed a small quantity of tramadol for  analgesia.   ____________________________________________   FINAL CLINICAL IMPRESSION(S) / ED DIAGNOSES  Final diagnoses:  Bilateral flank pain      NEW MEDICATIONS STARTED DURING THIS VISIT:  New Prescriptions   TRAMADOL (ULTRAM) 50 MG TABLET    Take 1 tablet (50 mg total) by mouth every 6 (six) hours as needed for up to 3 days for severe pain.     Note:  This document was prepared using Dragon voice recognition software and may include unintentional dictation errors.    Arta Silence, MD 12/28/20 1324

## 2020-12-28 NOTE — ED Notes (Signed)
Upon arrival to bedside for administration of pain medication, pt visualized sitting in bed on cell phone. Pt no longer crying/whimpering. Pt visualized in NAD, making jokes/laughing with this RN. Medication administered per order. EDP aware.

## 2020-12-28 NOTE — Discharge Instructions (Addendum)
Return to the ER for new, worsening, or persistent severe pain, vomiting, fever, urinary symptoms, or any other new or worsening symptoms that concern you.  Follow-up with your regular doctor.

## 2020-12-28 NOTE — ED Notes (Signed)
Pt provided with ice water per patient request.

## 2020-12-28 NOTE — ED Notes (Signed)
This RN to bedside due to patient calling out. Pt states "I just want to go home, I can do this at home!" This RN explained would speak with EDP regarding patient's continued pain. EDP aware.

## 2020-12-28 NOTE — ED Notes (Signed)
Pt returned from CT at this time.  

## 2020-12-28 NOTE — ED Notes (Signed)
Pt c/o L flank pain that radiates into lower back that started last night. Pt states used to be on hemodialysis but something happened to her port and patient was requesting PD from nephrologist however was holding off due to improving kidney function. Pt states hx of kidney failure and cysts on kidneys. Pt A&O x4 on arrival to room.

## 2020-12-28 NOTE — ED Triage Notes (Signed)
C/O bilateral kidney pain since last night.  STates used to have dialysis, but no longer receives it.  Kidney function at 38%.

## 2020-12-28 NOTE — ED Notes (Signed)
Pt up to the bathroom and back to bed independently. UA sent by this RN to lab.

## 2021-01-01 ENCOUNTER — Telehealth: Payer: Self-pay | Admitting: Nurse Practitioner

## 2021-01-01 ENCOUNTER — Other Ambulatory Visit: Payer: Medicare HMO | Admitting: Nurse Practitioner

## 2021-01-01 ENCOUNTER — Other Ambulatory Visit: Payer: Self-pay

## 2021-01-01 NOTE — Telephone Encounter (Signed)
I called Stacy Pearson to confirm f/u PC visit, Ms. Picking endorses will need to reschedule as she was on her way to Psychiatric appointment. Praised Ms. Monteleone for going, rescheduled per request

## 2021-02-11 ENCOUNTER — Other Ambulatory Visit: Payer: Medicare HMO | Admitting: Nurse Practitioner

## 2021-02-11 ENCOUNTER — Encounter: Payer: Self-pay | Admitting: Nurse Practitioner

## 2021-02-11 ENCOUNTER — Other Ambulatory Visit: Payer: Self-pay

## 2021-02-11 DIAGNOSIS — F419 Anxiety disorder, unspecified: Secondary | ICD-10-CM

## 2021-02-11 DIAGNOSIS — Z515 Encounter for palliative care: Secondary | ICD-10-CM

## 2021-02-11 DIAGNOSIS — R630 Anorexia: Secondary | ICD-10-CM
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
## 2021-03-07 ENCOUNTER — Encounter: Payer: Self-pay | Admitting: Emergency Medicine

## 2021-03-07 ENCOUNTER — Emergency Department
Admission: EM | Admit: 2021-03-07 | Discharge: 2021-03-07 | Disposition: A | Discharge status: Home / Self Care | Payer: Medicare HMO | Attending: Emergency Medicine | Admitting: Emergency Medicine

## 2021-03-07 ENCOUNTER — Other Ambulatory Visit: Payer: Self-pay

## 2021-03-07 ENCOUNTER — Emergency Department: Payer: Medicare HMO

## 2021-03-07 DIAGNOSIS — Z85828 Personal history of other malignant neoplasm of skin: Secondary | ICD-10-CM | POA: Insufficient documentation

## 2021-03-07 DIAGNOSIS — S299XXA Unspecified injury of thorax, initial encounter: Secondary | ICD-10-CM | POA: Diagnosis present

## 2021-03-07 DIAGNOSIS — Z79899 Other long term (current) drug therapy: Secondary | ICD-10-CM | POA: Insufficient documentation

## 2021-03-07 DIAGNOSIS — Y9389 Activity, other specified: Secondary | ICD-10-CM | POA: Insufficient documentation

## 2021-03-07 DIAGNOSIS — J449 Chronic obstructive pulmonary disease, unspecified: Secondary | ICD-10-CM | POA: Insufficient documentation

## 2021-03-07 DIAGNOSIS — J45909 Unspecified asthma, uncomplicated: Secondary | ICD-10-CM | POA: Diagnosis not present

## 2021-03-07 DIAGNOSIS — Z87891 Personal history of nicotine dependence: Secondary | ICD-10-CM | POA: Diagnosis not present

## 2021-03-07 DIAGNOSIS — Y92007 Garden or yard of unspecified non-institutional (private) residence as the place of occurrence of the external cause: Secondary | ICD-10-CM | POA: Diagnosis not present

## 2021-03-07 DIAGNOSIS — G20 Parkinson's disease: Secondary | ICD-10-CM | POA: Diagnosis not present

## 2021-03-07 DIAGNOSIS — X501XXA Overexertion from prolonged static or awkward postures, initial encounter: Secondary | ICD-10-CM | POA: Insufficient documentation

## 2021-03-07 DIAGNOSIS — S2232XA Fracture of one rib, left side, initial encounter for closed fracture: Secondary | ICD-10-CM | POA: Insufficient documentation

## 2021-03-07 DIAGNOSIS — I10 Essential (primary) hypertension: Secondary | ICD-10-CM | POA: Insufficient documentation

## 2021-03-07 DIAGNOSIS — Z7951 Long term (current) use of inhaled steroids: Secondary | ICD-10-CM | POA: Insufficient documentation

## 2021-03-07 DIAGNOSIS — S2231XA Fracture of one rib, right side, initial encounter for closed fracture: Secondary | ICD-10-CM

## 2021-03-07 MED ORDER — HYDROCODONE-ACETAMINOPHEN 5-325 MG PO TABS: 1.0000 | ORAL_TABLET | ORAL | 0 refills | Status: DC | PRN

## 2021-03-07 MED ORDER — HYDROCODONE-ACETAMINOPHEN 5-325 MG PO TABS
1.0000 | ORAL_TABLET | ORAL | Status: AC
  Administered 2021-03-07: 1 via ORAL
  Filled 2021-03-07: qty 1

## 2021-03-07 NOTE — ED Notes (Signed)
See triage note. Pt indicates pain primarily on lower right rib cage from sternal border to mid axillary line. Pain with deep breaths and raising right arm. Pt denies any previous injury to affected area.

## 2021-03-07 NOTE — ED Triage Notes (Signed)
Pt arrived via POV with reports of R rib cage pain since Sunday, pt states the pain started after working the yard using hedge clippers and chainsaw. PT is tender to touch on R side ribs.

## 2021-03-07 NOTE — Discharge Instructions (Addendum)
Please make sure you are taking good deep breaths.  Take Tylenol as needed for mild pain and Norco as needed for moderate to severe pain.  Do not exceed more than 4000 mg of Tylenol in a 24-hour period.  Please follow-up with primary care provider.  Return to the ER for any worsening symptoms or urgent changes in health.

## 2021-03-07 NOTE — ED Provider Notes (Signed)
[facility] EMERGENCY DEPARTMENT Provider Note   CSN: [id number] Arrival date & time: 03/07/21  1807     History Chief Complaint  Patient presents with   Rib Pain    Stacy Pearson is a 60 y.o. female.  Presents to the emergency department evaluation of right rib pain.  Patient states 4 days ago she was using hedge clippers to trim the hedges, she placed one of the long handles against her rib and used her right hand to squeeze the hedge tremors to cut a branch.  She felt a pop along her right rib and has had pain ever since.  She denies any chest pain, shortness of breath or back pain.  No numbness tingling radicular symptoms.  She said focal right rib pain that is worse with movement and taking a deep breath.  She has been taking Tylenol with no relief.  She is unable to take NSAIDs.  HPI     Past Medical History:  Diagnosis Date   Abdominal pain    Allergic genetic state    Anxiety    Asthma    Basal cell carcinoma    Bipolar disorder ([place])    Cancer (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[place])    GERD (gastroesophageal reflux disease)    Headache    History of basal cell carcinoma    Hypertension    Neuropathy    Parkinson's disease ([place])    Parkinson's disease ([place])    Parkinsonism ([place])     Patient Active Problem List   Diagnosis Date Noted   Epilepsy ([place]) 08/26/2020   Depression with anxiety 08/26/2020   HTN (hypertension) 44/62/8638   Acute metabolic encephalopathy 17/71/1657   UTI (urinary tract infection) 08/26/2020   AKI (acute kidney injury) ([place]) 08/26/2020   Severe sepsis with septic shock ([place]) 08/26/2020   Delirium 08/26/2020   Acute diarrhea 03/08/2018   Status post cervical spinal  fusion 12/31/2017   Cervical spondylosis with radiculopathy 10/28/2017   Colon polyp 10/08/2017   Essential tremor 10/08/2017   Cervical radiculopathy 09/03/2017   Allergic rhinitis 12/30/2016   Sting of hornets, wasps, and bees causing poisoning and toxic reactions 12/30/2016   Allergic rhinitis due to dogs 12/17/2016   Asthma, currently active 12/17/2016   Muscle cramps 11/18/2016   Sleep disorder 08/25/2016   At risk for falls 08/19/2016   Elevated vitamin B12 level 08/19/2016   History of fall within past 90 days 08/19/2016   Nausea and vomiting 08/19/2016   Parkinson's disease ([place]) 08/19/2016   Diaphoresis 03/25/2016   Dry scalp 03/25/2016   Dry skin 03/25/2016   Back pain 07/04/2015   Tremor 01/02/2014   Asthma 07/28/2013   COPD (chronic obstructive pulmonary disease) ([place]) 07/28/2013   Ulnar neuropathy of left upper extremity 06/06/2013   Meibomian gland dysfunction 11/09/2012   Raynaud phenomenon 05/03/2012   Anorexia nervosa with bulimia 01/17/2011   Basal cell carcinoma of other specified sites of skin 01/17/2011   Dyspepsia and other specified disorders of function of stomach 01/17/2011   Generalized anxiety disorder 01/17/2011   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   Migraine without aura 01/17/2011   Other atopic dermatitis and related conditions  01/17/2011   Bipolar 1 disorder ([place]) 04/28/1995    Past Surgical History:  Procedure Laterality Date   ABDOMINAL HYSTERECTOMY     ADENOIDECTOMY     BLADDER SURGERY     bone grafts     CATARACT EXTRACTION W/ INTRAOCULAR LENS IMPLANT     CERVICAL FUSION     COLONOSCOPY     COLONOSCOPY WITH ESOPHAGOGASTRODUODENOSCOPY (EGD)     COLONOSCOPY WITH PROPOFOL N/A 04/28/2018   Procedure: COLONOSCOPY WITH PROPOFOL;  Surgeon: Manya Silvas, MD;  [facility] ENDOSCOPY;  Service: Endoscopy;  Laterality: N/A;   DIALYSIS/PERMA CATHETER INSERTION Right 09/04/2020   Procedure: DIALYSIS/PERMA CATHETER  INSERTION;  Surgeon: Katha Cabal, MD;  Location: [facility];  Service: Cardiovascular;  Laterality: Right;   DIALYSIS/PERMA CATHETER REMOVAL Left 10/05/2020   Procedure: DIALYSIS/PERMA CATHETER REMOVAL;  Surgeon: Algernon Huxley, MD;  Location: [facility] LAB;  Service: Cardiovascular;  Laterality: Left;   EYE SURGERY     GRAFT APPLICATION     in neck   LAPAROSCOPIC BILATERAL SALPINGO OOPHERECTOMY     NEUROPLASTY / TRANSPOSITION ULNAR NERVE AT ELBOW     OOPHORECTOMY     SKIN CANCER EXCISION     skin grafts   left forearm; hand bil.lower extremeties   titanium plate     TONSILLECTOMY     TUBAL LIGATION       OB History   No obstetric history on file.     Family History  Problem Relation Age of Onset   Breast cancer Mother    Colon cancer Mother    Arthritis Mother    Lung cancer Father     Social History   Tobacco Use   Smoking status: Former    Packs/day: 1.00    Years: 20.00    Pack years: 20.00    Types: Cigarettes    Quit date: 07/14/2006    Years since quitting: 14.6   Smokeless tobacco: Never  Vaping Use   Vaping Use: Every day  Substance Use Topics   Alcohol use: Never   Drug use: Never    Home Medications Prior to Admission medications   Medication Sig Start Date End Date Taking? Authorizing Provider  amLODipine (NORVASC) 10 MG tablet Take 1 tablet (10 mg total) by mouth daily. 09/11/20   Fritzi Mandes, MD  carbidopa-levodopa (SINEMET IR) 25-100 MG tablet Take 1 tablet by mouth 3 (three) times daily. 09/10/20   Fritzi Mandes, MD  DULoxetine (CYMBALTA) 60 MG capsule Take by mouth. Patient not taking: No sig reported 05/03/19   [provider]  EPINEPHrine 0.3 mg/0.3 mL IJ SOAJ injection Inject into the muscle once.    [provider]  hydrALAZINE (APRESOLINE) 50 MG tablet Take 1 tablet (50 mg total) by mouth 2 (two) times daily. Patient not taking: Reported on 11/15/2020 09/10/20   Fritzi Mandes, MD  HYDROcodone-acetaminophen  [facility]) 5-325 MG tablet Take 1 tablet by mouth every 4 (four) hours as needed for moderate pain. 03/07/21   Duanne Guess, PA-C  Ipratropium-Albuterol (COMBIVENT) 20-100 MCG/ACT AERS respimat Inhale 1 puff into the lungs every 6 (six) hours.    [provider]  lamoTRIgine (LAMICTAL) 25 MG tablet Take 50 mg by mouth at bedtime. Patient not taking: No sig reported 08/14/20   [provider]  multivitamin (RENA-VIT) TABS tablet Take 1 tablet by mouth at bedtime. 09/10/20   Fritzi Mandes, MD  ondansetron (ZOFRAN ODT) 4 MG disintegrating tablet Take 1 tablet (4  mg total) by mouth every 8 (eight) hours as needed. 11/01/20   Rudene Re, MD  propranolol ER (INDERAL LA) 60 MG 24 hr capsule Take 60 mg by mouth daily. Patient not taking: Reported on 11/15/2020 08/14/20   [provider]  sevelamer carbonate (RENVELA) 800 MG tablet Take 1 tablet (800 mg total) by mouth 3 (three) times daily with meals. Patient not taking: No sig reported 09/10/20   Fritzi Mandes, MD  vitamin B-12 1000 MCG tablet Take 1 tablet (1,000 mcg total) by mouth daily. 09/11/20   Fritzi Mandes, MD    Allergies    Codeine, Other, and Prednisone  Review of Systems   Review of Systems  Constitutional:  Negative for chills and fever.  Respiratory:  Negative for cough, choking and shortness of breath.   Cardiovascular:  Negative for chest pain.  Gastrointestinal:  Negative for abdominal pain, nausea and vomiting.  Musculoskeletal:  Positive for arthralgias and myalgias. Negative for back pain, gait problem, joint swelling, neck pain and neck stiffness.  Skin:  Negative for rash and wound.   Physical Exam Updated Vital Signs BP 120/76 (BP Location: Left Arm)   Pulse 98   Temp 99.3 F (37.4 C) (Oral)   Resp 18   Ht 5\' 2"  (1.575 m)   Wt 54 kg   SpO2 99%   BMI 21.77 kg/m   Physical Exam  ED Results / Procedures / Treatments   Labs (all labs ordered are listed, but only abnormal results are  displayed) Labs Reviewed - No data to display  EKG None  Radiology DG Ribs Unilateral W/Chest Right  Result Date: 03/07/2021 CLINICAL DATA:  Right-sided rib pain following heavy labor, initial encounter EXAM: RIGHT RIBS AND CHEST - 3+ VIEW COMPARISON:  09/03/2020 FINDINGS: Cardiac shadow is within normal limits. The lungs are well aerated bilaterally. No pneumothorax is seen. Minimally displaced fracture of the right ninth rib anteriorly at the costochondral junction is noted. No other fractures are seen. IMPRESSION: Minimally displaced right ninth rib fracture anteriorly. No other focal abnormality is noted. Electronically Signed   By: Inez Catalina M.D.   On: 03/07/2021 19:19    Procedures Procedures   Medications Ordered in ED Medications  HYDROcodone-acetaminophen (NORCO/VICODIN) 5-325 MG per tablet 1 tablet (has no administration in time range)    ED Course  I have reviewed the triage vital signs and the nursing notes.  Pertinent labs & imaging results that were available during my care of the patient were reviewed by me and considered in my medical decision making (see chart for details).    MDM Rules/Calculators/A&P                         60 year old female with minimally displaced right ninth rib fracture.  Chest x-ray normal, right knee x-ray shows minimally displaced single ninth rib fracture.  Patient is able to take good deep breaths but with moderate pain.  Unable to take NSAIDs and Tylenol not providing her relief over the last 4 days.  Will give prescription for Norco.  She is educated on continuing to take good deep breaths and will follow-up with PCP.  She understands signs symptoms return to the ER for. Final Clinical Impression(s) / ED Diagnoses Final diagnoses:  Closed fracture of one rib of right side, initial encounter    Rx / DC Orders ED Discharge Orders          Ordered    HYDROcodone-acetaminophen ([place]) 5-325  MG tablet  Every 4 hours PRN,   Status:   Discontinued        03/07/21 2057    HYDROcodone-acetaminophen (NORCO) 5-325 MG tablet  Every 4 hours PRN        03/07/21 2059             Renata Caprice 03/07/21 2103    Vladimir Crofts, MD 03/08/21 1030

## 2021-03-15 ENCOUNTER — Other Ambulatory Visit: Payer: Self-pay

## 2021-03-15 ENCOUNTER — Observation Stay
Admission: EM | Admit: 2021-03-15 | Discharge: 2021-03-17 | Disposition: A | Discharge status: Home / Self Care | Payer: Medicare HMO | Attending: Internal Medicine | Admitting: Internal Medicine

## 2021-03-15 ENCOUNTER — Emergency Department: Payer: Medicare HMO

## 2021-03-15 DIAGNOSIS — Z85828 Personal history of other malignant neoplasm of skin: Secondary | ICD-10-CM | POA: Insufficient documentation

## 2021-03-15 DIAGNOSIS — Z79899 Other long term (current) drug therapy: Secondary | ICD-10-CM | POA: Insufficient documentation

## 2021-03-15 DIAGNOSIS — F319 Bipolar disorder, unspecified: Secondary | ICD-10-CM | POA: Diagnosis present

## 2021-03-15 DIAGNOSIS — G20 Parkinson's disease: Secondary | ICD-10-CM | POA: Insufficient documentation

## 2021-03-15 DIAGNOSIS — J9601 Acute respiratory failure with hypoxia: Secondary | ICD-10-CM | POA: Diagnosis not present

## 2021-03-15 DIAGNOSIS — K922 Gastrointestinal hemorrhage, unspecified: Secondary | ICD-10-CM | POA: Insufficient documentation

## 2021-03-15 DIAGNOSIS — Z20822 Contact with and (suspected) exposure to covid-19: Secondary | ICD-10-CM | POA: Insufficient documentation

## 2021-03-15 DIAGNOSIS — G40309 Generalized idiopathic epilepsy and epileptic syndromes, not intractable, without status epilepticus: Secondary | ICD-10-CM | POA: Diagnosis present

## 2021-03-15 DIAGNOSIS — I10 Essential (primary) hypertension: Secondary | ICD-10-CM | POA: Diagnosis not present

## 2021-03-15 DIAGNOSIS — F5002 Anorexia nervosa, binge eating/purging type: Secondary | ICD-10-CM | POA: Diagnosis present

## 2021-03-15 DIAGNOSIS — K921 Melena: Secondary | ICD-10-CM

## 2021-03-15 DIAGNOSIS — J449 Chronic obstructive pulmonary disease, unspecified: Secondary | ICD-10-CM | POA: Diagnosis not present

## 2021-03-15 DIAGNOSIS — J45909 Unspecified asthma, uncomplicated: Secondary | ICD-10-CM | POA: Diagnosis not present

## 2021-03-15 DIAGNOSIS — Z87891 Personal history of nicotine dependence: Secondary | ICD-10-CM | POA: Insufficient documentation

## 2021-03-15 DIAGNOSIS — E876 Hypokalemia: Secondary | ICD-10-CM | POA: Insufficient documentation

## 2021-03-15 DIAGNOSIS — K635 Polyp of colon: Secondary | ICD-10-CM | POA: Diagnosis present

## 2021-03-15 DIAGNOSIS — J181 Lobar pneumonia, unspecified organism: Secondary | ICD-10-CM | POA: Diagnosis not present

## 2021-03-15 DIAGNOSIS — R0602 Shortness of breath: Secondary | ICD-10-CM | POA: Diagnosis present

## 2021-03-15 DIAGNOSIS — J189 Pneumonia, unspecified organism: Secondary | ICD-10-CM

## 2021-03-15 LAB — CBC
HCT: 31.7 % — ABNORMAL LOW (ref 36.0–46.0)
HCT: 28.3 % — ABNORMAL LOW (ref 36.0–46.0)
Hemoglobin: 10.5 g/dL — ABNORMAL LOW (ref 12.0–15.0)
Hemoglobin: 9.4 g/dL — ABNORMAL LOW (ref 12.0–15.0)
MCH: 30.3 pg (ref 26.0–34.0)
MCH: 30.7 pg (ref 26.0–34.0)
MCHC: 33.1 g/dL (ref 30.0–36.0)
MCHC: 33.2 g/dL (ref 30.0–36.0)
MCV: 91.6 fL (ref 80.0–100.0)
MCV: 92.5 fL (ref 80.0–100.0)
Platelets: 593 10*3/uL — ABNORMAL HIGH (ref 150–400)
Platelets: 570 10*3/uL — ABNORMAL HIGH (ref 150–400)
RBC: 3.46 MIL/uL — ABNORMAL LOW (ref 3.87–5.11)
RBC: 3.06 MIL/uL — ABNORMAL LOW (ref 3.87–5.11)
RDW: 13.9 % (ref 11.5–15.5)
RDW: 13.9 % (ref 11.5–15.5)
WBC: 8.1 10*3/uL (ref 4.0–10.5)
WBC: 8.5 10*3/uL (ref 4.0–10.5)
nRBC: 0.4 % — ABNORMAL HIGH (ref 0.0–0.2)
nRBC: 0.2 % (ref 0.0–0.2)

## 2021-03-15 LAB — STREP PNEUMONIAE URINARY ANTIGEN: Strep Pneumo Urinary Antigen: NEGATIVE

## 2021-03-15 LAB — COMPREHENSIVE METABOLIC PANEL
ALT: 26 U/L (ref 0–44)
AST: 54 U/L — ABNORMAL HIGH (ref 15–41)
Albumin: 3 g/dL — ABNORMAL LOW (ref 3.5–5.0)
Alkaline Phosphatase: 81 U/L (ref 38–126)
Anion gap: 13 (ref 5–15)
BUN: 9 mg/dL (ref 6–20)
CO2: 31 mmol/L (ref 22–32)
Calcium: 9.1 mg/dL (ref 8.9–10.3)
Chloride: 94 mmol/L — ABNORMAL LOW (ref 98–111)
Creatinine, Ser: 0.83 mg/dL (ref 0.44–1.00)
GFR, Estimated: >60 mL/min (ref >60)
Glucose, Bld: 112 mg/dL — ABNORMAL HIGH (ref 70–99)
Potassium: 2.8 mmol/L — ABNORMAL LOW (ref 3.5–5.1)
Sodium: 138 mmol/L (ref 135–145)
Total Bilirubin: 0.7 mg/dL (ref 0.3–1.2)
Total Protein: 7.6 g/dL (ref 6.5–8.1)

## 2021-03-15 LAB — RESP PANEL BY RT-PCR (FLU A&B, COVID) ARPGX2
Influenza A by PCR: NEGATIVE
Influenza B by PCR: NEGATIVE
SARS Coronavirus 2 by RT PCR: NEGATIVE

## 2021-03-15 LAB — FERRITIN: Ferritin: 272 ng/mL (ref 11–307)

## 2021-03-15 LAB — TROPONIN I (HIGH SENSITIVITY)
Troponin I (High Sensitivity): 6 ng/L (ref <18)
Troponin I (High Sensitivity): 5 ng/L (ref <18)

## 2021-03-15 LAB — IRON AND TIBC
Iron: 29 ug/dL (ref 28–170)
Saturation Ratios: 12 % (ref 10.4–31.8)
TIBC: 244 ug/dL — ABNORMAL LOW (ref 250–450)
UIBC: 215 ug/dL

## 2021-03-15 MED ORDER — AZITHROMYCIN 500 MG PO TABS
500.0000 mg | ORAL_TABLET | Freq: Every day | ORAL | Status: DC
  Administered 2021-03-16 – 2021-03-17 (×2): 500 mg via ORAL
  Filled 2021-03-15 (×2): qty 1

## 2021-03-15 MED ORDER — POTASSIUM CHLORIDE 2 MEQ/ML IV SOLN
INTRAVENOUS | Status: DC
  Filled 2021-03-15 (×8): qty 1000

## 2021-03-15 MED ORDER — RENA-VITE PO TABS
1.0000 | ORAL_TABLET | Freq: Every day | ORAL | Status: DC
  Administered 2021-03-16: 1 via ORAL
  Filled 2021-03-15 (×3): qty 1

## 2021-03-15 MED ORDER — OXYCODONE HCL 5 MG PO TABS
5.0000 mg | ORAL_TABLET | ORAL | Status: DC | PRN
  Administered 2021-03-15 – 2021-03-17 (×6): 5 mg via ORAL
  Filled 2021-03-15 (×7): qty 1

## 2021-03-15 MED ORDER — AMLODIPINE BESYLATE 10 MG PO TABS
10.0000 mg | ORAL_TABLET | Freq: Every day | ORAL | Status: DC
  Administered 2021-03-15 – 2021-03-17 (×3): 10 mg via ORAL
  Filled 2021-03-15 (×3): qty 1

## 2021-03-15 MED ORDER — POTASSIUM CHLORIDE CRYS ER 20 MEQ PO TBCR
40.0000 meq | EXTENDED_RELEASE_TABLET | Freq: Once | ORAL | Status: AC
  Administered 2021-03-15: 40 meq via ORAL
  Filled 2021-03-15: qty 2

## 2021-03-15 MED ORDER — SODIUM CHLORIDE 0.9 % IV SOLN
2.0000 g | INTRAVENOUS | Status: DC
  Administered 2021-03-16 – 2021-03-17 (×2): 2 g via INTRAVENOUS
  Filled 2021-03-15 (×2): qty 2

## 2021-03-15 MED ORDER — LACTATED RINGERS IV BOLUS
1000.0000 mL | Freq: Once | INTRAVENOUS | Status: AC
  Administered 2021-03-15: 1000 mL via INTRAVENOUS

## 2021-03-15 MED ORDER — PANTOPRAZOLE SODIUM 40 MG IV SOLR
40.0000 mg | Freq: Once | INTRAVENOUS | Status: AC
  Administered 2021-03-15: 40 mg via INTRAVENOUS
  Filled 2021-03-15: qty 40

## 2021-03-15 MED ORDER — SODIUM CHLORIDE 0.9 % IV SOLN
500.0000 mg | Freq: Once | INTRAVENOUS | Status: AC
  Administered 2021-03-15: 500 mg via INTRAVENOUS
  Filled 2021-03-15: qty 500

## 2021-03-15 MED ORDER — ACETAMINOPHEN 650 MG RE SUPP: 650.0000 mg | Freq: Four times a day (QID) | RECTAL | Status: DC | PRN

## 2021-03-15 MED ORDER — MORPHINE SULFATE (PF) 4 MG/ML IV SOLN
4.0000 mg | Freq: Once | INTRAVENOUS | Status: AC
  Administered 2021-03-15: 4 mg via INTRAVENOUS
  Filled 2021-03-15: qty 1

## 2021-03-15 MED ORDER — SODIUM CHLORIDE 0.9 % IV SOLN
1.0000 g | Freq: Once | INTRAVENOUS | Status: AC
  Administered 2021-03-15: 1 g via INTRAVENOUS
  Filled 2021-03-15: qty 10

## 2021-03-15 MED ORDER — SODIUM CHLORIDE 0.9% FLUSH
3.0000 mL | Freq: Two times a day (BID) | INTRAVENOUS | Status: DC
  Administered 2021-03-15 – 2021-03-16 (×3): 3 mL via INTRAVENOUS

## 2021-03-15 MED ORDER — ONDANSETRON HCL 4 MG PO TABS: 4.0000 mg | ORAL_TABLET | Freq: Four times a day (QID) | ORAL | Status: DC | PRN

## 2021-03-15 MED ORDER — ACETAMINOPHEN 325 MG PO TABS: 650.0000 mg | ORAL_TABLET | Freq: Four times a day (QID) | ORAL | Status: DC | PRN

## 2021-03-15 MED ORDER — ONDANSETRON HCL 4 MG/2ML IJ SOLN: 4.0000 mg | Freq: Four times a day (QID) | INTRAMUSCULAR | Status: DC | PRN

## 2021-03-15 MED ORDER — POLYETHYLENE GLYCOL 3350 17 G PO PACK: 17.0000 g | PACK | Freq: Every day | ORAL | Status: DC | PRN

## 2021-03-15 MED ORDER — VITAMIN B-12 1000 MCG PO TABS
1000.0000 ug | ORAL_TABLET | Freq: Every day | ORAL | Status: DC
  Administered 2021-03-16 – 2021-03-17 (×2): 1000 ug via ORAL
  Filled 2021-03-15 (×2): qty 1

## 2021-03-15 MED ORDER — IPRATROPIUM-ALBUTEROL 20-100 MCG/ACT IN AERS: 1.0000 | INHALATION_SPRAY | Freq: Four times a day (QID) | RESPIRATORY_TRACT | Status: DC

## 2021-03-15 MED ORDER — TRAZODONE HCL 50 MG PO TABS: 25.0000 mg | ORAL_TABLET | Freq: Every evening | ORAL | Status: DC | PRN

## 2021-03-15 MED ORDER — CARBIDOPA-LEVODOPA 25-100 MG PO TABS
1.0000 | ORAL_TABLET | Freq: Three times a day (TID) | ORAL | Status: DC
  Administered 2021-03-15 – 2021-03-17 (×6): 1 via ORAL
  Filled 2021-03-15 (×8): qty 1

## 2021-03-15 MED ORDER — PANTOPRAZOLE SODIUM 40 MG IV SOLR
40.0000 mg | Freq: Two times a day (BID) | INTRAVENOUS | Status: DC
  Administered 2021-03-15 – 2021-03-17 (×4): 40 mg via INTRAVENOUS
  Filled 2021-03-15 (×4): qty 40

## 2021-03-15 MED ORDER — HYDROMORPHONE HCL 1 MG/ML IJ SOLN
0.5000 mg | INTRAMUSCULAR | Status: DC | PRN
  Administered 2021-03-15 – 2021-03-16 (×2): 1 mg via INTRAVENOUS
  Filled 2021-03-15 (×2): qty 1

## 2021-03-15 MED ORDER — IPRATROPIUM-ALBUTEROL 0.5-2.5 (3) MG/3ML IN SOLN
3.0000 mL | Freq: Four times a day (QID) | RESPIRATORY_TRACT | Status: DC
  Administered 2021-03-15 – 2021-03-16 (×3): 3 mL via RESPIRATORY_TRACT
  Filled 2021-03-15 (×3): qty 3

## 2021-03-15 NOTE — ED Triage Notes (Signed)
Pt arrives via pov from home with c/o Sob, rib pain. Pt becoming winded when speaking. Pt was seen here on 8/25 for fractured rib, pt reports sob and pain has been continuously increasing since. Pt also reports black tar stools x 6 days.

## 2021-03-15 NOTE — ED Notes (Signed)
Patient ambulated to room commode with a steady gait. 

## 2021-03-15 NOTE — ED Notes (Signed)
Informed RN bed assigned 

## 2021-03-15 NOTE — ED Notes (Signed)
Report given to Se[facility]ity RN. 

## 2021-03-15 NOTE — H&P (Signed)
[facility] History and Physical  Stacy Pearson DOB: 15-Jan-1961 DOA: 03/15/2021  Referring physician: Dr. Tamala Julian PCP: Valera Castle, MD   Chief Complaint: shortness of breath  HPI: Stacy Pearson is a 60 y.o. female with history of hypertension, anorexia, bipolar 1, epilepsy, colonic polyps, and COPD, who presents with shortness of breath.  Patient was seen in the ED 1 week ago at after unintentionally fracturing her right ninth rib.  She is given a short course of Norco and discharged home with PCP follow-up.  Today she presents reporting worsening shortness of breath since her last visit.  In addition she has had ongoing pain due to her fractured ribs as well as a dry cough.  She has had some chills but has not checked her temperature so does not know if she had a fever.  Also reporting pain primarily in the epigastrium and right upper quadrant as well as dark, black, tarry stools.  The started about 5 days ago.  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  She takes only Tylenol for pain relief.  She denies any alcohol use.  In the ED initial vital signs notable for mild tachypnea, no hypoxia, remainder vital signs were unremarkable.  CBC showed a normal white count, hemoglobin of 10.5 which is reduced from 12.1 on last check 2 months prior, and elevated platelets at 593.  CMP showed hypokalemia to 2.8, mildly elevated AST at 54, otherwise unremarkable.  EKG was unremarkable.  Chest x-ray was obtained which showed new bilateral opacities concerning for pneumonia.  She was given ceftriaxone, azithromycin, potassium supplementation, and IV PPI, and admitted for further management of her pneumonia and possible melena.  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[place])    GERD (gastroesophageal reflux disease)    Headache    History of basal cell carcinoma    Hypertension    Neuropathy    Parkinson's disease ([place])    Parkinson's disease ([place])    Parkinsonism ([place])    Past Surgical History:  Procedure Laterality Date   ABDOMINAL HYSTERECTOMY     ADENOIDECTOMY     BLADDER SURGERY     bone grafts     CATARACT EXTRACTION W/ INTRAOCULAR LENS IMPLANT     CERVICAL FUSION     COLONOSCOPY     COLONOSCOPY WITH ESOPHAGOGASTRODUODENOSCOPY (EGD)     COLONOSCOPY WITH PROPOFOL N/A 04/28/2018   Procedure: COLONOSCOPY WITH PROPOFOL;  Surgeon: Manya Silvas, MD;  Location: [facility] ENDOSCOPY;  Service: Endoscopy;  Laterality: N/A;   DIALYSIS/PERMA CATHETER INSERTION Right 09/04/2020   Procedure: DIALYSIS/PERMA CATHETER INSERTION;  Surgeon: Katha Cabal, MD;  Location: [place] CV LAB;  Service: Cardiovascular;  Laterality: Right;   DIALYSIS/PERMA CATHETER REMOVAL Left 10/05/2020   Procedure: DIALYSIS/PERMA CATHETER REMOVAL;  Surgeon: Algernon Huxley, MD;  Location: [place] [facility] LAB;  Service: Cardiovascular;  Laterality: Left;   EYE SURGERY     GRAFT APPLICATION     in neck   LAPAROSCOPIC BILATERAL SALPINGO OOPHERECTOMY     NEUROPLASTY / TRANSPOSITION ULNAR NERVE AT ELBOW     OOPHORECTOMY     SKIN CANCER EXCISION     skin grafts   left forearm; hand bil.lower extremeties   titanium plate     TONSILLECTOMY     TUBAL LIGATION     Social History:  reports that she quit smoking about 14 years ago. Her smoking use included cigarettes. She has a 20.00 pack-year smoking history. She has  never used smokeless tobacco. She reports that she does not drink alcohol and does not use drugs.  Allergies  Allergen Reactions   Codeine Nausea And Vomiting   Other     Pt states oral steroids send her into a rage    Prednisone     Family History  Problem Relation Age of Onset   Breast cancer Mother    Colon cancer Mother    Arthritis Mother    Lung cancer Father      Prior to Admission medications   Medication Sig Start Date End Date Taking? Authorizing Provider  amLODipine (NORVASC) 10 MG tablet Take 1 tablet (10 mg total) by mouth daily. 09/11/20   Fritzi Mandes, MD  carbidopa-levodopa (SINEMET IR) 25-100 MG tablet Take 1 tablet by mouth 3 (three) times daily. 09/10/20   Fritzi Mandes, MD  DULoxetine (CYMBALTA) 60 MG capsule Take by mouth. Patient not taking: No sig reported 05/03/19   [provider]  EPINEPHrine 0.3 mg/0.3 mL IJ SOAJ injection Inject into the muscle once.    [provider]  hydrALAZINE (APRESOLINE) 50 MG tablet Take 1 tablet (50 mg total) by mouth 2 (two) times daily. Patient not taking: Reported on 11/15/2020 09/10/20   Fritzi Mandes, MD  HYDROcodone-acetaminophen [facility]) 5-325 MG tablet Take 1 tablet by mouth every 4 (four) hours as needed for moderate pain. 03/07/21   Duanne Guess, PA-C  Ipratropium-Albuterol (COMBIVENT) 20-100 MCG/ACT AERS respimat Inhale 1 puff into the lungs every 6 (six) hours.    [provider]  lamoTRIgine (LAMICTAL) 25 MG tablet Take 50 mg by mouth at bedtime. Patient not taking: No sig reported 08/14/20   [provider]  multivitamin (RENA-VIT) TABS tablet Take 1 tablet by mouth at bedtime. 09/10/20   Fritzi Mandes, MD  ondansetron (ZOFRAN ODT) 4 MG disintegrating tablet Take 1 tablet (4 mg total) by mouth every 8 (eight) hours as needed. 11/01/20   Rudene Re, MD  propranolol ER (INDERAL LA) 60 MG 24 hr capsule Take 60 mg by mouth daily. Patient not taking: Reported on 11/15/2020 08/14/20   [provider]  sevelamer carbonate (RENVELA) 800 MG tablet Take 1 tablet (800 mg total) by mouth 3 (three) times daily with meals. Patient not taking: No sig reported 09/10/20   Fritzi Mandes, MD  vitamin B-12 1000 MCG tablet Take 1 tablet (1,000 mcg total) by mouth daily. 09/11/20   Fritzi Mandes, MD   Physical Exam: Vitals:   03/15/21 0956 03/15/21 1002  BP: 116/69   Pulse: 98   Resp: (!) 22   Temp: (!) 97.4 F (36.3 C)   TempSrc: Oral   SpO2: 95%   Weight:  52.6 kg  Height:  5\' 2"  (1.575 m)    Wt Readings from Last 3 Encounters:  03/15/21 52.6 kg  03/07/21 54 kg  12/28/20 56.5 kg     General:  Appears calm and comfortable Eyes: PERRL, normal lids, irises & conjunctiva ENT: grossly normal hearing, lips & tongue Neck: no masses Cardiovascular: RRR, no m/r/g. No LE edema. Telemetry: SR, no arrhythmias  Respiratory: Normal respiratory effort.  Crackles at the left lower base but lung fields otherwise clear to auscultation, no wheezes appreciated. Abdomen: soft, mild tenderness to palpation in the right upper quadrant Skin: no rash or induration seen on limited exam Musculoskeletal: grossly normal tone BUE/BLE Psychiatric: grossly normal mood and affect, speech fluent and appropriate Neurologic: grossly non-focal.          Labs on Admission:  Basic Metabolic Panel: Recent Labs  Lab 03/15/21 1005  NA 138  K 2.8*  CL 94*  CO2 31  GLUCOSE 112*  BUN 9  CREATININE 0.83  CALCIUM 9.1   Liver Function Tests: Recent Labs  Lab 03/15/21 1005  AST 54*  ALT 26  ALKPHOS 81  BILITOT 0.7  PROT 7.6  ALBUMIN 3.0*   No results for input(s): LIPASE, AMYLASE in the last 168 hours. No results for input(s): AMMONIA in the last 168 hours. CBC: Recent Labs  Lab 03/15/21 1005  WBC 8.1  HGB 10.5*  HCT 31.7*  MCV 91.6  PLT 593*   Cardiac Enzymes: No results for input(s): CKTOTAL, CKMB, CKMBINDEX, TROPONINI in the last 168 hours.  BNP (last 3 results) No results for  input(s): BNP in the last 8760 hours.  ProBNP (last 3 results) No results for input(s): PROBNP in the last 8760 hours.  CBG: No results for input(s): GLUCAP in the last 168 hours.  Radiological Exams on Admission: DG Chest 2 View  Result Date: 03/15/2021 CLINICAL DATA:  Shortness of breath and increasing pain. Recent rib fracture. EXAM: CHEST - 2 VIEW COMPARISON:  03/07/2021 FINDINGS: The cardiomediastinal silhouette is unchanged with normal heart size. Aortic atherosclerosis is noted. The lungs are well inflated. There are new patchy and nodular opacities in the left mid and upper lung and right mid and lower lung. No pleural effusion or pneumothorax is identified. Prior cervical spine fusion is noted. IMPRESSION: New bilateral lung opacities concerning for pneumonia. Electronically Signed   By: Logan Bores M.D.   On: 03/15/2021 10:54    EKG: Independently reviewed.  Normal sinus rhythm, possible right atrial enlargement, no signs of ischemic change, otherwise unremarkable.  No significant changes compared to prior.  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 - Continue ceftriaxone and azithromycin for 5 days - Consult RT - Continue home Combivent every 6 hours - Check urine Legionella and strep antigens  #Melena Unclear etiology, denies NSAID or alcohol use.  Reports prior EGD but none on file that I can see.  Hemoglobin only mildly below her baseline.  Will observe overnight, if stable given the holiday weekend will schedule urgent EGD as an outpatient, otherwise maintain inpatient for procedure. - Check Hemoccult - Type  and screen is active - IV PPI twice daily - Trend CBC every 12 time-clear liquid diet  #Hypokalemia Mild, possibly related to increased stooling and poor p.o. intake  in setting of acute illness. - Status post p.o. supplementation in the ED - Start LR with 20 mEq of potassium at 125 cc an hour continuous  #Chronic medical problems Hypertension-continue amlodipine 10 mg daily Parkinson's disease-continue home Sinemet B12 deficiency-continue home supplement  Code Status: DNR/DNI, confirmed with patient DVT Prophylaxis: SCDs Family Communication: Mother Jetta Lout updated by phone at patient's request Disposition Plan: Observation, Med-Surg   Time spent: 50 min  Clarnce Flock MD/[facility]  Note:  This document was prepared using Systems analyst and may include unintentional dictation errors.

## 2021-03-15 NOTE — ED Provider Notes (Signed)
[facility] Emergency Department Provider Note ____________________________________________   Event Date/Time   First MD Initiated Contact with Patient 03/15/21 1014     (approximate)  I have reviewed the triage vital signs and the nursing notes.  HISTORY  Chief Complaint Shortness of Breath, Rib Injury, and Rectal Bleeding   HPI Stacy Pearson is a 60 y.o. femalewho presents to the ED for evaluation of SOB.   Chart review indicates patient was seen here just over 1 week ago for accidental traumatic right-sided rib fracture, minimally displaced closed ninth rib fracture on the right.  Patient returns to the ED today due to worsening shortness of breath, cough, weakness and melena.  She reports worsening symptoms over the past 3-4 days.  She reports cough, minimally productive of sputum, worsening shortness of breath and dyspnea on exertion.  She reports sensation of generalized weakness and presyncope without syncope or falls.  Reports chills without documented fevers.  Further reports black and tarry melanotic stools over the past 3-4 days.  Denies NSAID usage.  Reports remote history of GERD and an EGD nearly 10 years ago, but no current PPI usage or recent EGDs.  Denies hematochezia, emesis or hematuria.  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[place])    GERD (gastroesophageal reflux disease)    Headache    History of basal cell carcinoma    Hypertension    Neuropathy    Parkinson's disease ([place])    Parkinson's disease ([place])    Parkinsonism ([place])      Patient Active Problem List   Diagnosis Date Noted   Epilepsy ([place]) 08/26/2020   Depression with anxiety 08/26/2020   HTN (hypertension) 25/95/6387   Acute metabolic encephalopathy 56/43/3295   UTI (urinary tract infection) 08/26/2020   AKI (acute kidney injury) ([place]) 08/26/2020   Severe sepsis with septic shock ([place]) 08/26/2020   Delirium 08/26/2020   Acute diarrhea 03/08/2018   Status post cervical spinal fusion 12/31/2017   Cervical spondylosis with radiculopathy 10/28/2017   Colon polyp 10/08/2017   Essential tremor 10/08/2017   Cervical radiculopathy 09/03/2017   Allergic rhinitis 12/30/2016   Sting of hornets, wasps, and bees causing poisoning and toxic reactions 12/30/2016   Allergic rhinitis due to dogs 12/17/2016   Asthma, currently active 12/17/2016   Muscle cramps 11/18/2016   Sleep disorder 08/25/2016   At risk for falls 08/19/2016   Elevated vitamin B12 level 08/19/2016   History of fall within past 90 days 08/19/2016   Nausea and vomiting 08/19/2016   Parkinson's disease ([place]) 08/19/2016   Diaphoresis 03/25/2016   Dry scalp 03/25/2016   Dry skin 03/25/2016   Back pain 07/04/2015   Tremor 01/02/2014   Asthma 07/28/2013   COPD (chronic obstructive pulmonary disease) ([place]) 07/28/2013   Ulnar neuropathy of left upper extremity 06/06/2013   Meibomian gland dysfunction 11/09/2012   Raynaud phenomenon 05/03/2012   Anorexia nervosa with bulimia 01/17/2011   Basal cell carcinoma of other specified sites of  skin 01/17/2011   Dyspepsia and other specified disorders of function of stomach 01/17/2011   Generalized anxiety disorder 01/17/2011   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 ENDOSCOPY;  Service: Endoscopy;  Laterality: N/A;   DIALYSIS/PERMA CATHETER INSERTION Right 09/04/2020   Procedure: DIALYSIS/PERMA CATHETER INSERTION;  Surgeon: Katha Cabal, MD;  Location: [place] CV LAB;  Service: Cardiovascular;  Laterality: Right;   DIALYSIS/PERMA CATHETER REMOVAL Left 10/05/2020   Procedure: DIALYSIS/PERMA CATHETER REMOVAL;  Surgeon: Algernon Huxley, MD;  Location: [place] [facility];  Service: Cardiovascular;  Laterality: Left;   EYE SURGERY     GRAFT APPLICATION     in neck   LAPAROSCOPIC BILATERAL SALPINGO OOPHERECTOMY     NEUROPLASTY / TRANSPOSITION ULNAR NERVE AT ELBOW     OOPHORECTOMY     SKIN CANCER EXCISION     skin grafts   left forearm; hand bil.lower extremeties   titanium plate     TONSILLECTOMY     TUBAL LIGATION      Prior to Admission medications   Medication Sig Start Date End Date Taking? Authorizing Provider  amLODipine (NORVASC) 10 MG tablet Take 1 tablet (10 mg total) by mouth daily. 09/11/20   Fritzi Mandes, MD  carbidopa-levodopa (SINEMET IR) 25-100 MG tablet Take 1 tablet by mouth 3 (three) times daily. 09/10/20   Fritzi Mandes, MD  DULoxetine (CYMBALTA) 60 MG capsule Take by mouth. Patient not taking: No sig reported 05/03/19   [provider]  EPINEPHrine 0.3 mg/0.3 mL IJ SOAJ injection Inject into the muscle once.    [provider]  hydrALAZINE (APRESOLINE) 50 MG tablet Take 1 tablet (50 mg total) by mouth 2 (two) times daily. Patient not taking: Reported on 11/15/2020 09/10/20   Fritzi Mandes, MD  HYDROcodone-acetaminophen [facility]) 5-325 MG tablet Take 1 tablet by mouth every 4 (four) hours as needed for moderate pain. 03/07/21   Duanne Guess, PA-C   Ipratropium-Albuterol (COMBIVENT) 20-100 MCG/ACT AERS respimat Inhale 1 puff into the lungs every 6 (six) hours.    [provider]  lamoTRIgine (LAMICTAL) 25 MG tablet Take 50 mg by mouth at bedtime. Patient not taking: No sig reported 08/14/20   [provider]  multivitamin (RENA-VIT) TABS tablet Take 1 tablet by mouth at bedtime. 09/10/20   Fritzi Mandes, MD  ondansetron (ZOFRAN ODT) 4 MG disintegrating tablet Take 1 tablet (4 mg total) by mouth every 8 (eight) hours as needed. 11/01/20   Rudene Re, MD  propranolol ER (INDERAL LA) 60 MG 24 hr capsule Take 60 mg by mouth daily. Patient not taking: Reported on 11/15/2020 08/14/20   [provider]  sevelamer carbonate (RENVELA) 800 MG tablet Take 1 tablet (800 mg total) by mouth 3 (three) times daily with meals. Patient not taking: No sig reported 09/10/20   Fritzi Mandes, MD  vitamin B-12 1000 MCG tablet Take  1 tablet (1,000 mcg total) by mouth daily. 09/11/20   Fritzi Mandes, MD    Allergies Codeine, Other, and Prednisone  Family History  Problem Relation Age of Onset   Breast cancer Mother    Colon cancer Mother    Arthritis Mother    Lung cancer Father     Social History Social History   Tobacco Use   Smoking status: Former    Packs/day: 1.00    Years: 20.00    Pack years: 20.00    Types: Cigarettes    Quit date: 07/14/2006    Years since quitting: 14.6   Smokeless tobacco: Never  Vaping Use   Vaping Use: Every day  Substance Use Topics   Alcohol use: Never   Drug use: Never    Review of Systems  Constitutional: Positive for subjective fever/chills and generalized weakness Eyes: No visual changes. ENT: No sore throat. Cardiovascular: Denies chest pain. Respiratory: Positive dyspnea on exertion, cough and shortness of breath. Gastrointestinal: No abdominal pain.  No nausea, no vomiting.  No constipation. Positive for melena Genitourinary: Negative for dysuria. Musculoskeletal: Negative for  back pain. Skin: Negative for rash. Neurological: Negative for headaches, focal weakness or numbness.  ____________________________________________   PHYSICAL EXAM:  VITAL SIGNS: Vitals:   03/15/21 0956  BP: 116/69  Pulse: 98  Resp: (!) 22  Temp: (!) 97.4 F (36.3 C)  SpO2: 95%    Constitutional: Alert and oriented.  Appears uncomfortable but in no acute distress. Eyes: Conjunctivae are normal. PERRL. EOMI. Head: Atraumatic. Nose: No congestion/rhinnorhea. Mouth/Throat: Mucous membranes are moist.  Oropharynx non-erythematous. Neck: No stridor. No cervical spine tenderness to palpation. Cardiovascular: Normal rate, regular rhythm. Grossly normal heart sounds.  Good peripheral circulation. Respiratory: Minimal tachypnea to the low 20s, no further evidence of distress . no retractions.  No wheezing. Gastrointestinal: Soft , nondistended, nontender to palpation. No CVA tenderness. Musculoskeletal: No lower extremity tenderness nor edema.  No joint effusions. No signs of acute trauma. Neurologic:  Normal speech and language. No gross focal neurologic deficits are appreciated. No gait instability noted. Skin:  Skin is warm, dry and intact. No rash noted. Psychiatric: Mood and affect are normal. Speech and behavior are normal. ____________________________________________   LABS (all labs ordered are listed, but only abnormal results are displayed)  Labs Reviewed  CBC - Abnormal; Notable for the following components:      Result Value   RBC 3.46 (*)    Hemoglobin 10.5 (*)    HCT 31.7 (*)    Platelets 593 (*)    nRBC 0.4 (*)    All other components within normal limits  COMPREHENSIVE METABOLIC PANEL - Abnormal; Notable for the following components:   Potassium 2.8 (*)    Chloride 94 (*)    Glucose, Bld 112 (*)    Albumin 3.0 (*)    AST 54 (*)    All other components within normal limits  CULTURE, BLOOD (SINGLE)  RESP PANEL BY RT-PCR (FLU A&B, COVID) ARPGX2  POC URINE  PREG, ED  POC OCCULT BLOOD, ED  TYPE AND SCREEN  TROPONIN I (HIGH SENSITIVITY)  TROPONIN I (HIGH SENSITIVITY)   ____________________________________________  12 Lead EKG  Sinus rhythm, rate 96 bpm.  Normal axis and intervals.  No evidence of acute ischemia. ____________________________________________  RADIOLOGY  ED MD interpretation:  2 vw cxr reviewed by me with new patchy bilateral opacities  Official radiology report(s): DG Chest 2 View  Result Date: 03/15/2021 CLINICAL DATA:  Shortness of breath  and increasing pain. Recent rib fracture. EXAM: CHEST - 2 VIEW COMPARISON:  03/07/2021 FINDINGS: The cardiomediastinal silhouette is unchanged with normal heart size. Aortic atherosclerosis is noted. The lungs are well inflated. There are new patchy and nodular opacities in the left mid and upper lung and right mid and lower lung. No pleural effusion or pneumothorax is identified. Prior cervical spine fusion is noted. IMPRESSION: New bilateral lung opacities concerning for pneumonia. Electronically Signed   By: Logan Bores M.D.   On: 03/15/2021 10:54    ____________________________________________   PROCEDURES and INTERVENTIONS  Procedure(s) performed (including Critical Care):  .1-3 Lead EKG Interpretation  Date/Time: 03/15/2021 1:37 PM Performed by: Vladimir Crofts, MD Authorized by: Vladimir Crofts, MD     Interpretation: normal     ECG rate:  94   ECG rate assessment: normal     Rhythm: sinus rhythm     Ectopy: none     Conduction: normal    Medications  morphine 4 MG/ML injection 4 mg (has no administration in time range)  cefTRIAXone (ROCEPHIN) 1 g in sodium chloride 0.9 % 100 mL IVPB (has no administration in time range)  azithromycin (ZITHROMAX) 500 mg in sodium chloride 0.9 % 250 mL IVPB (has no administration in time range)  pantoprazole (PROTONIX) injection 40 mg (has no administration in time range)  lactated ringers bolus 1,000 mL (has no administration in time range)   potassium chloride SA (KLOR-CON) CR tablet 40 mEq (has no administration in time range)    ____________________________________________   MDM / ED COURSE   60 year old woman with recent rib fracture presents to the ED with shortness of breath and melena, concerning for CAP and possible upper GI bleed, requiring medical admission.  Hemodynamically stable without tachycardia or hypoxia.  No stigmata of sepsis.  Hypokalemia is repleted orally.  Blood work does demonstrate a two-point hemoglobin drop from most recent CBC, in conjunction with her melena is concerning for upper GI bleed.  She does not take any anticoagulation and denies any NSAID use with a recent rib fracture.  CXR with evidence of pneumonia, but no PTX.  We will provide CAP coverage and admit to medicine for observation considering her hemoglobin drop.     ____________________________________________   FINAL CLINICAL IMPRESSION(S) / ED DIAGNOSES  Final diagnoses:  Community acquired pneumonia of left upper lobe of lung  Gastrointestinal hemorrhage with melena     ED Discharge Orders     None        Shantrice Rodenberg   Note:  This document was prepared using Set designer software and may include unintentional dictation errors.    Vladimir Crofts, MD 03/15/21 1346

## 2021-03-16 DIAGNOSIS — J181 Lobar pneumonia, unspecified organism: Secondary | ICD-10-CM | POA: Diagnosis not present

## 2021-03-16 DIAGNOSIS — J9601 Acute respiratory failure with hypoxia: Secondary | ICD-10-CM | POA: Diagnosis not present

## 2021-03-16 DIAGNOSIS — K921 Melena: Secondary | ICD-10-CM | POA: Diagnosis not present

## 2021-03-16 DIAGNOSIS — E876 Hypokalemia: Secondary | ICD-10-CM | POA: Diagnosis not present

## 2021-03-16 LAB — OCCULT BLOOD X 1 CARD TO LAB, STOOL: Fecal Occult Bld: POSITIVE — AB

## 2021-03-16 LAB — COMPREHENSIVE METABOLIC PANEL
ALT: 5 U/L (ref 0–44)
AST: 24 U/L (ref 15–41)
Albumin: 2.4 g/dL — ABNORMAL LOW (ref 3.5–5.0)
Alkaline Phosphatase: 58 U/L (ref 38–126)
Anion gap: 7 (ref 5–15)
BUN: 5 mg/dL — ABNORMAL LOW (ref 6–20)
CO2: 32 mmol/L (ref 22–32)
Calcium: 8.4 mg/dL — ABNORMAL LOW (ref 8.9–10.3)
Chloride: 101 mmol/L (ref 98–111)
Creatinine, Ser: 0.72 mg/dL (ref 0.44–1.00)
GFR, Estimated: >60 mL/min (ref >60)
Glucose, Bld: 113 mg/dL — ABNORMAL HIGH (ref 70–99)
Potassium: 3.2 mmol/L — ABNORMAL LOW (ref 3.5–5.1)
Sodium: 140 mmol/L (ref 135–145)
Total Bilirubin: 0.5 mg/dL (ref 0.3–1.2)
Total Protein: 5.7 g/dL — ABNORMAL LOW (ref 6.5–8.1)

## 2021-03-16 LAB — CBC
HCT: 24.7 % — ABNORMAL LOW (ref 36.0–46.0)
HCT: 28.6 % — ABNORMAL LOW (ref 36.0–46.0)
Hemoglobin: 8.2 g/dL — ABNORMAL LOW (ref 12.0–15.0)
Hemoglobin: 9.1 g/dL — ABNORMAL LOW (ref 12.0–15.0)
MCH: 30.3 pg (ref 26.0–34.0)
MCH: 30.3 pg (ref 26.0–34.0)
MCHC: 33.2 g/dL (ref 30.0–36.0)
MCHC: 31.8 g/dL (ref 30.0–36.0)
MCV: 91.1 fL (ref 80.0–100.0)
MCV: 95.3 fL (ref 80.0–100.0)
Platelets: 455 10*3/uL — ABNORMAL HIGH (ref 150–400)
Platelets: 614 10*3/uL — ABNORMAL HIGH (ref 150–400)
RBC: 2.71 MIL/uL — ABNORMAL LOW (ref 3.87–5.11)
RBC: 3 MIL/uL — ABNORMAL LOW (ref 3.87–5.11)
RDW: 13.9 % (ref 11.5–15.5)
RDW: 14 % (ref 11.5–15.5)
WBC: 7.6 10*3/uL (ref 4.0–10.5)
WBC: 6.4 10*3/uL (ref 4.0–10.5)
nRBC: 0 % (ref 0.0–0.2)
nRBC: 0 % (ref 0.0–0.2)

## 2021-03-16 MED ORDER — SENNOSIDES-DOCUSATE SODIUM 8.6-50 MG PO TABS
1.0000 | ORAL_TABLET | Freq: Two times a day (BID) | ORAL | Status: DC
  Administered 2021-03-16 (×2): 1 via ORAL
  Filled 2021-03-16 (×2): qty 1

## 2021-03-16 MED ORDER — GUAIFENESIN ER 600 MG PO TB12
600.0000 mg | ORAL_TABLET | Freq: Two times a day (BID) | ORAL | Status: DC
  Administered 2021-03-16 – 2021-03-17 (×3): 600 mg via ORAL
  Filled 2021-03-16 (×3): qty 1

## 2021-03-16 MED ORDER — IPRATROPIUM-ALBUTEROL 0.5-2.5 (3) MG/3ML IN SOLN: 3.0000 mL | Freq: Two times a day (BID) | RESPIRATORY_TRACT | Status: DC

## 2021-03-16 MED ORDER — IPRATROPIUM-ALBUTEROL 0.5-2.5 (3) MG/3ML IN SOLN
3.0000 mL | Freq: Two times a day (BID) | RESPIRATORY_TRACT | Status: DC
  Administered 2021-03-16 – 2021-03-17 (×2): 3 mL via RESPIRATORY_TRACT
  Filled 2021-03-16 (×2): qty 3

## 2021-03-16 MED ORDER — IPRATROPIUM-ALBUTEROL 0.5-2.5 (3) MG/3ML IN SOLN: 3.0000 mL | Freq: Four times a day (QID) | RESPIRATORY_TRACT | Status: DC | PRN

## 2021-03-16 NOTE — Plan of Care (Signed)
Patient orientedx4, VSS. LR/K+ infusing w/o issue. Independent with ambulation to bathroom, adequate UO but no BM this shift, pt aware of ordered stool sample. Right rib pain managed with PRN oxycodone and dilaudid per MAR. Incentive spirometer provided along with education, pt able to use independently. Safety precautions in place, rounding performed, needs/concerns addressed during shift.   Problem: Education: Goal: Knowledge of General Education information will improve Description: Including pain rating scale, medication(s)/side effects and non-pharmacologic comfort measures Outcome: Progressing   Problem: Health Behavior/Discharge Planning: Goal: Ability to manage health-related needs will improve Outcome: Progressing   Problem: Clinical Measurements: Goal: Ability to maintain clinical measurements within normal limits will improve Outcome: Progressing Goal: Will remain free from infection Outcome: Progressing Goal: Diagnostic test results will improve Outcome: Progressing Goal: Respiratory complications will improve Outcome: Progressing Goal: Cardiovascular complication will be avoided Outcome: Progressing   Problem: Activity: Goal: Risk for activity intolerance will decrease Outcome: Progressing   Problem: Nutrition: Goal: Adequate nutrition will be maintained Outcome: Progressing   Problem: Coping: Goal: Level of anxiety will decrease Outcome: Progressing   Problem: Elimination: Goal: Will not experience complications related to bowel motility Outcome: Progressing Goal: Will not experience complications related to urinary retention Outcome: Progressing   Problem: Pain Managment: Goal: General experience of comfort will improve Outcome: Progressing   Problem: Safety: Goal: Ability to remain free from injury will improve Outcome: Progressing

## 2021-03-16 NOTE — Progress Notes (Signed)
PROGRESS NOTE    Stacy Pearson  IRC:[id number] DOB: Jun 23, 1961 DOA: 03/15/2021 PCP: Valera Castle, MD    Chief Complaint  Patient presents with   Shortness of Breath   Rib Injury   Rectal Bleeding    Brief Narrative:  Stacy Pearson is a 60 y.o. female with history of hypertension, anorexia, bipolar 1, epilepsy, colonic polyps, and COPD, who presents with shortness of breath.   Patient was seen in the ED 1 week ago at after unintentionally fracturing her right ninth rib.  She is given a short course of Norco and discharged home with PCP follow-up.  Found to have pneumonia  Also has tarry stool /anemia GI consulted   Subjective:   Still has sob with minimal exertion, reports oxygen supplement helped  Right side rib pain is " terrible" , not able to sleep well  Continue to have dark tarry stool, some generalized ab pain, hgb trending down   Assessment & Plan:   Active Problems:   Anorexia nervosa with bulimia   Bipolar 1 disorder (HCC)   Colon polyp   COPD (chronic obstructive pulmonary disease) (HCC)   Generalized tonic clonic epilepsy ([place])   Parkinson's disease (HCC)   HTN (hypertension)   Pneumonia   Melena   Pneumonia, acute hypoxia respiratory failure O2 sats was 87% on room air, currently on 2liter o2,  Continue abx  GI bleed with blood loss anemia Dark tarry stool FOBT + Monitor hgb, continue ppi,  gi consulted  Hypokalemia Remain low, continue replace Check magnesium  Hypertension continue Norvasc Parkinson's disease-continue home Sinemet B12 deficiency-continue home supplement    Nutritional Assessment:  The patient's BMI is: Body mass index is 21.22 kg/m.Marland Kitchen      Unresulted Labs (From admission, onward)     Start     Ordered   03/17/21 0500  CBC  Tomorrow morning,   R       Question:  Specimen collection method  Answer:  Lab=Lab collect   03/16/21 0946   03/17/21 5102  Basic metabolic panel  Tomorrow morning,   R        Question:  Specimen collection method  Answer:  Lab=Lab collect   03/16/21 0946   03/17/21 0500  Magnesium  Tomorrow morning,   R       Question:  Specimen collection method  Answer:  Lab=Lab collect   03/16/21 0946   03/15/21 1700  CBC  Now then every 12 hours,   STAT (with TIMED occurrences)      03/15/21 1537   03/15/21 1538  Legionella Pneumophila Serogp 1 Ur Ag  (COPD / Pneumonia / Cellulitis / Lower Extremity Wound)  Once,   STAT        03/15/21 1537   Unscheduled  Occult blood card to lab, stool  As needed,   TIMED      03/16/21 0944              DVT prophylaxis: SCDs Start: 03/15/21 1538   Code Status: DNR Family Communication: Patient Disposition:   Status is: Observation   Dispo: The patient is from: home              Anticipated d/c is to: home              Anticipated d/c date is: TBD, need wean oxygen, need to monitor hgb, gi consulted due to dropping hgb and continued dark tarry stool  Consultants:  GI  Procedures:  none  Antimicrobials:    Anti-infectives (From admission, onward)    Start     Dose/Rate Route Frequency Ordered Stop   03/16/21 1000  cefTRIAXone (ROCEPHIN) 2 g in sodium chloride 0.9 % 100 mL IVPB        2 g 200 mL/hr over 30 Minutes Intravenous Every 24 hours 03/15/21 1537 03/20/21 0959   03/16/21 1000  azithromycin (ZITHROMAX) tablet 500 mg        500 mg Oral Daily 03/15/21 1537 03/20/21 0959   03/15/21 1145  cefTRIAXone (ROCEPHIN) 1 g in sodium chloride 0.9 % 100 mL IVPB        1 g 200 mL/hr over 30 Minutes Intravenous  Once 03/15/21 1141 03/15/21 1332   03/15/21 1145  azithromycin (ZITHROMAX) 500 mg in sodium chloride 0.9 % 250 mL IVPB        500 mg 250 mL/hr over 60 Minutes Intravenous  Once 03/15/21 1141 03/15/21 1537          Objective: Vitals:   03/16/21 0249 03/16/21 0528 03/16/21 0724 03/16/21 0844  BP:  (!) 104/49  (!) 100/58  Pulse:  85  89  Resp:  20  20  Temp:  99.2 F (37.3 C)  98.6 F  (37 C)  TempSrc:  Oral    SpO2: 94% 91% 92% 96%  Weight:      Height:        Intake/Output Summary (Last 24 hours) at 03/16/2021 0946 Last data filed at 03/16/2021 0322 Gross per 24 hour  Intake 1037.84 ml  Output 1 ml  Net 1036.84 ml   Filed Weights   03/15/21 1002  Weight: 52.6 kg    Examination:  General exam: calm, NAD Respiratory system: diminished, no wheezing, no rales, no rhonchi. Respiratory effort normal. Cardiovascular system: S1 & S2 heard, RRR. No JVD, no murmur, No pedal edema. Gastrointestinal system: Abdomen is nondistended, soft and nontender. Normal bowel sounds heard. Central nervous system: Alert and oriented. No focal neurological deficits. Extremities: no edema Skin: No rashes, lesions or ulcers Psychiatry: Judgement and insight appear normal. Mood & affect appropriate.     Data Reviewed: I have personally reviewed following labs and imaging studies  CBC: Recent Labs  Lab 03/15/21 1005 03/15/21 1732 03/16/21 0506  WBC 8.1 8.5 7.6  HGB 10.5* 9.4* 8.2*  HCT 31.7* 28.3* 24.7*  MCV 91.6 92.5 91.1  PLT 593* 570* 455*    Basic Metabolic Panel: Recent Labs  Lab 03/15/21 1005 03/16/21 0506  NA 138 140  K 2.8* 3.2*  CL 94* 101  CO2 31 32  GLUCOSE 112* 113*  BUN 9 5*  CREATININE 0.83 0.72  CALCIUM 9.1 8.4*    GFR: Estimated Creatinine Clearance: 59.1 mL/min (by C-G formula based on SCr of 0.72 mg/dL).  Liver Function Tests: Recent Labs  Lab 03/15/21 1005 03/16/21 0506  AST 54* 24  ALT 26 5  ALKPHOS 81 58  BILITOT 0.7 0.5  PROT 7.6 5.7*  ALBUMIN 3.0* 2.4*    CBG: No results for input(s): GLUCAP in the last 168 hours.   Recent Results (from the past 240 hour(s))  Resp Panel by RT-PCR (Flu A&B, Covid) Nasopharyngeal Swab     Status: None   Collection Time: 03/15/21 11:48 AM   Specimen: Nasopharyngeal Swab; Nasopharyngeal(NP) swabs in vial transport medium  Result Value Ref Range Status   SARS Coronavirus 2 by RT PCR NEGATIVE  NEGATIVE Final    Comment: (NOTE)  SARS-CoV-2 target nucleic acids are NOT DETECTED.  The SARS-CoV-2 RNA is generally detectable in upper respiratory specimens during the acute phase of infection. The lowest concentration of SARS-CoV-2 viral copies this assay can detect is 138 copies/mL. A negative result does not preclude SARS-Cov-2 infection and should not be used as the sole basis for treatment or other patient management decisions. A negative result may occur with  improper specimen collection/handling, submission of specimen other than nasopharyngeal swab, presence of viral mutation(s) within the areas targeted by this assay, and inadequate number of viral copies(<138 copies/mL). A negative result must be combined with clinical observations, patient history, and epidemiological information. The expected result is Negative.  Fact Sheet for Patients:  EntrepreneurPulse.com.au  Fact Sheet for Healthcare Providers:  IncredibleEmployment.be  This test is no t yet approved or cleared by the [place] FDA and  has been authorized for detection and/or diagnosis of SARS-CoV-2 by FDA under an Emergency Use Authorization (EUA). This EUA will remain  in effect (meaning this test can be used) for the duration of the COVID-19 declaration under Section 564(b)(1) of the Act, 21 U.S.C.section 360bbb-3(b)(1), unless the authorization is terminated  or revoked sooner.       Influenza A by PCR NEGATIVE NEGATIVE Final   Influenza B by PCR NEGATIVE NEGATIVE Final    Comment: (NOTE) The Xpert Xpress SARS-CoV-2/FLU/RSV plus assay is intended as an aid in the diagnosis of influenza from Nasopharyngeal swab specimens and should not be used as a sole basis for treatment. Nasal washings and aspirates are unacceptable for Xpert Xpress SARS-CoV-2/FLU/RSV testing.  Fact Sheet for Patients: EntrepreneurPulse.com.au  Fact Sheet for Healthcare  Providers: IncredibleEmployment.be  This test is not yet approved or cleared by the [place] FDA and has been authorized for detection and/or diagnosis of SARS-CoV-2 by FDA under an Emergency Use Authorization (EUA). This EUA will remain in effect (meaning this test can be used) for the duration of the COVID-19 declaration under Section 564(b)(1) of the Act, 21 U.S.C. section 360bbb-3(b)(1), unless the authorization is terminated or revoked.  Performed at [facility], [address] [place], [place] [address]   Blood culture (single)     Status: None (Preliminary result)   Collection Time: 03/15/21 11:57 AM   Specimen: BLOOD  Result Value Ref Range Status   Specimen Description BLOOD RIGHT ANTECUBITAL  Final   Special Requests   Final    BOTTLES DRAWN AEROBIC AND ANAEROBIC Blood Culture results may not be optimal due to an excessive volume of blood received in culture bottles   Culture   Final    NO GROWTH < 24 HOURS Performed at [facility], [address], [place],  [address]    Report Status PENDING  Incomplete         Radiology Studies: DG Chest 2 View  Result Date: 03/15/2021 CLINICAL DATA:  Shortness of breath and increasing pain. Recent rib fracture. EXAM: CHEST - 2 VIEW COMPARISON:  03/07/2021 FINDINGS: The cardiomediastinal silhouette is unchanged with normal heart size. Aortic atherosclerosis is noted. The lungs are well inflated. There are new patchy and nodular opacities in the left mid and upper lung and right mid and lower lung. No pleural effusion or pneumothorax is identified. Prior cervical spine fusion is noted. IMPRESSION: New bilateral lung opacities concerning for pneumonia. Electronically Signed   By: Logan Bores M.D.   On: 03/15/2021 10:54        Scheduled Meds:  amLODipine  10 mg Oral  Daily   azithromycin  500 mg Oral Daily   carbidopa-levodopa  1 tablet Oral TID   multivitamin  1 tablet Oral QHS    pantoprazole (PROTONIX) IV  40 mg Intravenous Q12H   sodium chloride flush  3 mL Intravenous Q12H   cyanocobalamin  1,000 mcg Oral Daily   Continuous Infusions:  cefTRIAXone (ROCEPHIN)  IV 2 g (03/16/21 0852)   lactated ringers with kcl 125 mL/hr at 03/16/21 0536     LOS: 0 days   Time spent: 79mins Greater than 50% of this time was spent in counseling, explanation of diagnosis, planning of further management, and coordination of care.   Voice Recognition Viviann Spare dictation system was used to create this note, attempts have been made to correct errors. Please contact the author with questions and/or clarifications.   Florencia Reasons, MD PhD FACP [facility]  Available via Epic secure chat 7am-7pm for nonurgent issues Please page for urgent issues To page the attending provider between 7A-7P or the covering provider during after hours 7P-7A, please log into the web site [email or web address] and access using universal [place] password for that web site. If you do not have the password, please call the hospital operator.    03/16/2021, 9:46 AM

## 2021-03-17 DIAGNOSIS — J9601 Acute respiratory failure with hypoxia: Secondary | ICD-10-CM | POA: Diagnosis not present

## 2021-03-17 DIAGNOSIS — K921 Melena: Secondary | ICD-10-CM

## 2021-03-17 DIAGNOSIS — D649 Anemia, unspecified: Secondary | ICD-10-CM

## 2021-03-17 DIAGNOSIS — J181 Lobar pneumonia, unspecified organism: Secondary | ICD-10-CM | POA: Diagnosis not present

## 2021-03-17 LAB — CBC
HCT: 25.4 % — ABNORMAL LOW (ref 36.0–46.0)
Hemoglobin: 8.4 g/dL — ABNORMAL LOW (ref 12.0–15.0)
MCH: 31.5 pg (ref 26.0–34.0)
MCHC: 33.1 g/dL (ref 30.0–36.0)
MCV: 95.1 fL (ref 80.0–100.0)
Platelets: 541 10*3/uL — ABNORMAL HIGH (ref 150–400)
RBC: 2.67 MIL/uL — ABNORMAL LOW (ref 3.87–5.11)
RDW: 14.5 % (ref 11.5–15.5)
WBC: 5.9 10*3/uL (ref 4.0–10.5)
nRBC: 0 % (ref 0.0–0.2)

## 2021-03-17 LAB — BASIC METABOLIC PANEL
Anion gap: 8 (ref 5–15)
BUN: <5 mg/dL — ABNORMAL LOW (ref 6–20)
CO2: 32 mmol/L (ref 22–32)
Calcium: 8.8 mg/dL — ABNORMAL LOW (ref 8.9–10.3)
Chloride: 101 mmol/L (ref 98–111)
Creatinine, Ser: 0.82 mg/dL (ref 0.44–1.00)
GFR, Estimated: >60 mL/min (ref >60)
Glucose, Bld: 108 mg/dL — ABNORMAL HIGH (ref 70–99)
Potassium: 4.1 mmol/L (ref 3.5–5.1)
Sodium: 141 mmol/L (ref 135–145)

## 2021-03-17 LAB — MAGNESIUM: Magnesium: 1.8 mg/dL (ref 1.7–2.4)

## 2021-03-17 MED ORDER — CEPHALEXIN 500 MG PO CAPS: 500.0000 mg | ORAL_CAPSULE | Freq: Three times a day (TID) | ORAL | 0 refills | Status: AC

## 2021-03-17 MED ORDER — PANTOPRAZOLE SODIUM 40 MG PO TBEC: 40.0000 mg | DELAYED_RELEASE_TABLET | Freq: Two times a day (BID) | ORAL | 0 refills | Status: AC

## 2021-03-17 MED ORDER — CYANOCOBALAMIN 1000 MCG PO TABS: 1000.0000 ug | ORAL_TABLET | Freq: Every day | ORAL | 0 refills | Status: AC

## 2021-03-17 MED ORDER — ACETAMINOPHEN 325 MG PO TABS: 325.0000 mg | ORAL_TABLET | Freq: Four times a day (QID) | ORAL | Status: AC | PRN

## 2021-03-17 MED ORDER — GUAIFENESIN ER 600 MG PO TB12: 600.0000 mg | ORAL_TABLET | Freq: Two times a day (BID) | ORAL | Status: AC

## 2021-03-17 MED ORDER — CEPHALEXIN 500 MG PO CAPS: 500.0000 mg | ORAL_CAPSULE | Freq: Three times a day (TID) | ORAL | Status: DC

## 2021-03-17 MED ORDER — AZITHROMYCIN 250 MG PO TABS: 250.0000 mg | ORAL_TABLET | Freq: Every day | ORAL | 0 refills | Status: AC

## 2021-03-17 MED ORDER — OXYCODONE HCL 5 MG PO TABS: 5.0000 mg | ORAL_TABLET | Freq: Four times a day (QID) | ORAL | 0 refills | Status: AC | PRN

## 2021-03-17 MED ORDER — OMEGA-3 1000 MG PO CAPS: 1000.0000 mg | ORAL_CAPSULE | Freq: Every day | ORAL | 0 refills | Status: AC

## 2021-03-17 NOTE — Discharge Summary (Signed)
Discharge Summary  Stacy Pearson QIH:[id number] DOB: 12-Mar-1961  PCP: Valera Castle, MD  Admit date: 03/15/2021 Discharge date: 03/17/2021  Time spent: 43mins, more than 50% time spent on coordination of care.   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    Discharge Condition: stable  Diet recommendation: heart healthy  Filed Weights   03/15/21 1002  Weight: 52.6 kg    History of present illness: ( per admitting MD Dr Dione Plover) Chief Complaint: shortness of breath   HPI: Stacy Pearson is a 60 y.o. female with history of hypertension, anorexia, bipolar 1, epilepsy, colonic polyps, and COPD, who presents with shortness of breath.   Patient was seen in the ED 1 week ago at after unintentionally fracturing her right ninth rib.  She is given a short course of Norco and discharged home with PCP follow-up.   Today she presents reporting worsening shortness of breath since her last visit.  In addition she has had ongoing pain due to her fractured ribs as well as a dry cough.  She has had some chills but has not checked her temperature so does not know if she had a fever.   Also reporting pain primarily in the epigastrium and right upper quadrant as well as dark, black, tarry stools.  The started about 5 days ago.   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  She takes only Tylenol for pain relief.  She denies any alcohol use.   In the ED initial vital signs notable for mild tachypnea, no hypoxia, remainder vital signs were unremarkable.  CBC showed a normal white count, hemoglobin of 10.5 which is reduced from 12.1 on last check 2 months prior, and elevated platelets at 593.  CMP showed hypokalemia to 2.8, mildly elevated AST at 54, otherwise unremarkable.  EKG was unremarkable.  Chest x-ray was obtained which showed new bilateral opacities concerning for pneumonia.  She was given ceftriaxone, azithromycin, potassium supplementation, and IV PPI, and admitted for further management of her pneumonia and possible melena.  Hospital Course:  Active Problems:   Anorexia nervosa with bulimia   Bipolar 1 disorder (HCC)   Colon polyp   COPD (chronic obstructive pulmonary disease) (HCC)   Generalized tonic clonic epilepsy ([place])   Parkinson's disease (HCC)   HTN (hypertension)   Pneumonia   Melena   Multifocal pneumonia, acute hypoxia respiratory failure O2 sats was 87% on room air, was put on 2liter o2,  Cxa on presentation "There are new patchy and nodular opacities in the left mid and upper lung and right mid and lower lung" Blood culture no growth COVID screening negative Improved with Rocephin and Zithromax , she is weaned off oxygen , ambulating O2 on room air remained above 90%  -She  desires to go home , she is discharge on Keflex and Zithromax to finish antibiotic treatment course  -she is to follow up with pcp , pcp to repeat cxr in 4-6 weeks   GI bleed with blood loss anemia Dark tarry stool FOBT + Hgb 10.5- 9.4-8.2-9.1-8.4 She is treated with iv ppi bid in the hospital, reports stool now brown She is seen by GI and offered egd on 9/5 but she desires to go home and come back for outpatient EGD She is discharged on ppi bid She  is to follow up with gi   Hypokalemia K 2.8 on presentation  Replaced and normalized    Hypertension continue Norvasc Parkinson's disease-continue home Sinemet B12 deficiency-continue home supplement  Procedures: none  Consultations: Prbc transfusion   Discharge Exam: BP (!) 103/44 (BP Location: Right Arm)   Pulse 92   Temp 99.2 F (37.3 C)   Resp 16   Ht 5\' 2"  (1.575 m)   Wt 52.6 kg   SpO2 90%   BMI 21.22 kg/m   General: NAD Cardiovascular: RRR Respiratory: CTABL  Discharge Instructions You were cared for by a hospitalist during your hospital stay. If you have any questions about your discharge medications or the care you received while you were in the hospital after you are discharged, you can call the unit and asked to speak with the hospitalist on call if the hospitalist that took care of you is not available. Once you are discharged, your primary care physician will handle any further medical issues. Please note that NO REFILLS for any discharge medications will be authorized once you are discharged, as it is imperative that you return to your primary care physician (or establish a relationship with a primary care physician if you do not have one) for your aftercare needs so that they can reassess your need for medications and monitor your lab values.  Discharge Instructions     Diet - low sodium heart healthy   Complete by: As directed    Increase activity slowly   Complete by: As directed       Allergies as of 03/17/2021       Reactions   Codeine Nausea And Vomiting   Other    Pt states oral steroids send her into a rage    Prednisone         Medication List     STOP taking these medications    DULoxetine 60 MG capsule Commonly known as: CYMBALTA   hydrALAZINE 50 MG tablet Commonly known as: APRESOLINE   HYDROcodone-acetaminophen 5-325 MG tablet Commonly known as: Norco   lamoTRIgine 25 MG tablet Commonly known as: LAMICTAL   multivitamin  Tabs tablet   ondansetron 4 MG disintegrating tablet Commonly known as: Zofran ODT   propranolol ER 60 MG 24 hr capsule Commonly known as: INDERAL LA   QUEtiapine 50 MG tablet Commonly known as: SEROQUEL   sevelamer carbonate 800 MG tablet Commonly known as: RENVELA       TAKE these medications    acetaminophen 325 MG tablet Commonly known as: TYLENOL Take 1 tablet (325 mg total) by mouth every 6 (six) hours as needed for mild pain (or Fever >/= 101).   amLODipine 10 MG tablet Commonly known as: NORVASC Take 1 tablet (10 mg total) by mouth daily.   azithromycin 250 MG tablet Commonly known as: Zithromax Take 1 tablet (250 mg total) by mouth daily for 2 days.   carbidopa-levodopa 25-100 MG tablet Commonly known as:  SINEMET IR Take 1 tablet by mouth 3 (three) times daily.   cephALEXin 500 MG capsule Commonly known as: KEFLEX Take 1 capsule (500 mg total) by mouth every 8 (eight) hours for 2 days.   cyanocobalamin 1000 MCG tablet Take 1 tablet (1,000 mcg total) by mouth daily.   EPINEPHrine 0.3 mg/0.3 mL Soaj injection Commonly known as: EPI-PEN Inject into the muscle once.   guaiFENesin 600 MG 12 hr tablet Commonly known as: MUCINEX Take 1 tablet (600 mg total) by mouth 2 (two) times daily.   Ipratropium-Albuterol 20-100 MCG/ACT Aers respimat Commonly known as: COMBIVENT Inhale 1 puff into the lungs every 6 (six) hours.   Omega-3 1000 MG Caps Take 1 capsule (1,000 mg total) by mouth daily at 12 noon. Hold for now What changed: additional instructions   ondansetron 8 MG tablet Commonly known as: ZOFRAN Take 8 mg by mouth 3 (three) times daily.   oxyCODONE 5 MG immediate release tablet Commonly known as: Oxy IR/ROXICODONE Take 1 tablet (5 mg total) by mouth every 6 (six) hours as needed for moderate pain.   pantoprazole 40 MG tablet Commonly known as: Protonix Take 1 tablet (40 mg total) by mouth 2 (two) times daily.       Allergies  Allergen  Reactions   Codeine Nausea And Vomiting   Other     Pt states oral steroids send her into a rage    Prednisone     Follow-up Information     Valera Castle, MD Follow up in 1 week(s).   Specialty: Family Medicine Why: for hospital discharge follow up, pcp to repeat cxr in 3-4 weeks for ensure resolution of pneumonia pcp to repeat cbc to monitor anemia  Office Closed, Patient to make own follow-up appointment Contact information: [place] [address] [phone]         Lin Landsman, MD Follow up.   Specialty: Gastroenterology Why: to schedule EGD next week for GI bleed  Office Closed Today, Patient to make own follow-up appointment. Contact information: [place] [address] [phone]                  The results of significant diagnostics from this hospitalization (including imaging, microbiology, ancillary and laboratory) are listed below for reference.    Significant Diagnostic Studies: DG Chest 2 View  Result Date: 03/15/2021 CLINICAL DATA:  Shortness of breath and increasing pain. Recent rib fracture. EXAM: CHEST - 2 VIEW COMPARISON:  03/07/2021 FINDINGS: The cardiomediastinal silhouette is unchanged with normal heart size. Aortic atherosclerosis is noted. The lungs are well inflated. There are new patchy and nodular opacities in the left mid and upper lung and right mid and lower lung. No pleural effusion or pneumothorax is identified. Prior cervical spine fusion is noted. IMPRESSION: New bilateral lung opacities concerning for pneumonia. Electronically Signed   By: Logan Bores M.D.   On: 03/15/2021 10:54   DG Ribs Unilateral W/Chest Right  Result Date: 03/07/2021 CLINICAL DATA:  Right-sided rib pain following heavy labor, initial encounter EXAM: RIGHT RIBS AND CHEST - 3+ VIEW COMPARISON:  09/03/2020 FINDINGS: Cardiac shadow is within normal limits. The lungs are well aerated bilaterally. No pneumothorax  is seen. Minimally displaced fracture of the right ninth rib anteriorly at the costochondral junction is noted. No other fractures are seen. IMPRESSION: Minimally displaced right ninth rib fracture anteriorly. No other focal abnormality is noted. Electronically Signed   By: Linus Mako.D.  On: 03/07/2021 19:19    Microbiology: Recent Results (from the past 240 hour(s))  Resp Panel by RT-PCR (Flu A&B, Covid) Nasopharyngeal Swab     Status: None   Collection Time: 03/15/21 11:48 AM   Specimen: Nasopharyngeal Swab; Nasopharyngeal(NP) swabs in vial transport medium  Result Value Ref Range Status   SARS Coronavirus 2 by RT PCR NEGATIVE NEGATIVE Final    Comment: (NOTE) SARS-CoV-2 target nucleic acids are NOT DETECTED.  The SARS-CoV-2 RNA is generally detectable in upper respiratory specimens during the acute phase of infection. The lowest concentration of SARS-CoV-2 viral copies this assay can detect is 138 copies/mL. A negative result does not preclude SARS-Cov-2 infection and should not be used as the sole basis for treatment or other patient management decisions. A negative result may occur with  improper specimen collection/handling, submission of specimen other than nasopharyngeal swab, presence of viral mutation(s) within the areas targeted by this assay, and inadequate number of viral copies(<138 copies/mL). A negative result must be combined with clinical observations, patient history, and epidemiological information. The expected result is Negative.  Fact Sheet for Patients:  EntrepreneurPulse.com.au  Fact Sheet for Healthcare Providers:  IncredibleEmployment.be  This test is no t yet approved or cleared by the [place] FDA and  has been authorized for detection and/or diagnosis of SARS-CoV-2 by FDA under an Emergency Use Authorization (EUA). This EUA will remain  in effect (meaning this test can be used) for the duration of  the COVID-19 declaration under Section 564(b)(1) of the Act, 21 U.S.C.section 360bbb-3(b)(1), unless the authorization is terminated  or revoked sooner.       Influenza A by PCR NEGATIVE NEGATIVE Final   Influenza B by PCR NEGATIVE NEGATIVE Final    Comment: (NOTE) The Xpert Xpress SARS-CoV-2/FLU/RSV plus assay is intended as an aid in the diagnosis of influenza from Nasopharyngeal swab specimens and should not be used as a sole basis for treatment. Nasal washings and aspirates are unacceptable for Xpert Xpress SARS-CoV-2/FLU/RSV testing.  Fact Sheet for Patients: EntrepreneurPulse.com.au  Fact Sheet for Healthcare Providers: IncredibleEmployment.be  This test is not yet approved or cleared by the [place] FDA and has been authorized for detection and/or diagnosis of SARS-CoV-2 by FDA under an Emergency Use Authorization (EUA). This EUA will remain in effect (meaning this test can be used) for the duration of the COVID-19 declaration under Section 564(b)(1) of the Act, 21 U.S.C. section 360bbb-3(b)(1), unless the authorization is terminated or revoked.  Performed at [facility], [address] [place], [place] [address]   Blood culture (single)     Status: None (Preliminary result)   Collection Time: 03/15/21 11:57 AM   Specimen: BLOOD  Result Value Ref Range Status   Specimen Description BLOOD RIGHT ANTECUBITAL  Final   Special Requests   Final    BOTTLES DRAWN AEROBIC AND ANAEROBIC Blood Culture results may not be optimal due to an excessive volume of blood received in culture bottles   Culture   Final    NO GROWTH < 24 HOURS Performed at [facility], [address] [place], [place] [address]    Report Status PENDING  Incomplete     Labs: Basic Metabolic Panel: Recent Labs  Lab 03/15/21 1005 03/16/21 0506 03/17/21 0528  NA 138 140 141  K 2.8* 3.2* 4.1  CL 94* 101 101  CO2 31 32 32  GLUCOSE 112*  113* 108*  BUN 9 5* <5*  CREATININE 0.83 0.72 0.82  CALCIUM 9.1  8.4* 8.8*  MG  --   --  1.8   Liver Function Tests: Recent Labs  Lab 03/15/21 1005 03/16/21 0506  AST 54* 24  ALT 26 5  ALKPHOS 81 58  BILITOT 0.7 0.5  PROT 7.6 5.7*  ALBUMIN 3.0* 2.4*   No results for input(s): LIPASE, AMYLASE in the last 168 hours. No results for input(s): AMMONIA in the last 168 hours. CBC: Recent Labs  Lab 03/15/21 1005 03/15/21 1732 03/16/21 0506 03/16/21 1717 03/17/21 0528  WBC 8.1 8.5 7.6 6.4 5.9  HGB 10.5* 9.4* 8.2* 9.1* 8.4*  HCT 31.7* 28.3* 24.7* 28.6* 25.4*  MCV 91.6 92.5 91.1 95.3 95.1  PLT 593* 570* 455* 614* 541*   Cardiac Enzymes: No results for input(s): CKTOTAL, CKMB, CKMBINDEX, TROPONINI in the last 168 hours. BNP: BNP (last 3 results) No results for input(s): BNP in the last 8760 hours.  ProBNP (last 3 results) No results for input(s): PROBNP in the last 8760 hours.  CBG: No results for input(s): GLUCAP in the last 168 hours.     Signed:  Florencia Reasons MD, PhD, FACP  [facility] 03/17/2021, 6:07 PM

## 2021-03-17 NOTE — Consult Note (Signed)
Cephas Darby, MD [address]  [place]  [place], [place] [address]  Main: [phone]  Fax: [phone] Pager: [phone]   Consultation  Referring Provider:     No ref. provider found Primary Care Physician:  Valera Castle, MD Primary Gastroenterologist: Althia Forts         Reason for Consultation:     Black stool  Date of Admission:  03/15/2021 Date of Consultation:  03/17/2021         HPI:   Stacy Pearson is a 60 y.o. female with history of hypertension, bipolar, COPD is admitted with pneumonia.  She had a fractured right ninth rib about a week ago.  Patient also reported pain in epigastric and right upper quadrant area associated with black tarry stool that started about 5 days ago.  Patient thinks her pain is secondary to fracture and pneumonia.  Her last black bowel movement was yesterday and it was once.  Did not have any today.  She denies any NSAID use, alcohol use.  She was found to have hemoglobin of 10.5 on 9/2, 8.4 today with thrombocytosis.  Her last normal hemoglobin was 12.1 on 12/28/2020.  No evidence of chronic liver disease.  Patient is started on PPI 40 mg twice daily and GI is consulted for evaluation of anemia. Of note, patient has been receiving LR 125 ml/hr since admission 9/2  When I interviewed the patient, first thing she asked me was if she could go home because she has to take care of her elderly mom and her dog.  She says she feels fine and could do upper endoscopy as outpatient.  Patient denies any GI symptoms today  NSAIDs: None  Antiplts/Anticoagulants/Anti thrombotics: None  GI Procedures:  Colonoscopy 04/28/2018 - Three small polyps at the hepatic flexure and in the ascending colon, removed with a hot snare. Resected and retrieved. - Diverticulosis in the sigmoid colon and in the descending colon. - The examined portion of the ileum was normal. Fluid aspiration performed.  DIAGNOSIS:  A.  COLON POLYP X2, ASCENDING; HOT  SNARE:  - TUBULAR ADENOMA (MULTIPLE FRAGMENTS).  - NEGATIVE FOR HIGH-GRADE DYSPLASIA AND MALIGNANCY.   B.  COLON POLYP, HEPATIC FLEXURE; HOT SNARE:  - TUBULAR ADENOMA.  - NEGATIVE FOR HIGH-GRADE DYSPLASIA AND MALIGNANCY.   Colonoscopy 03/08/2014 Diagnosis:  ASCENDING COLON POLYP COLD BIOPSY:  - TUBULAR ADENOMA.  - NEGATIVE FOR HIGH GRADE DYSPLASIA AND MALIGNANCY.   Past Medical History:  Diagnosis Date   Abdominal pain    Allergic genetic state    Anxiety    Asthma    Basal cell carcinoma    Bipolar disorder (HCC)    Cancer (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[facility])    GERD (gastroesophageal reflux disease)    Headache    History of basal cell carcinoma    Hypertension    Neuropathy    Parkinson's disease ([facility])    Parkinson's disease ([facility])    Parkinsonism ([facility])     Past Surgical History:  Procedure Laterality Date   ABDOMINAL HYSTERECTOMY     ADENOIDECTOMY     BLADDER SURGERY     bone grafts     CATARACT EXTRACTION  W/ INTRAOCULAR LENS IMPLANT     CERVICAL FUSION     COLONOSCOPY     COLONOSCOPY WITH ESOPHAGOGASTRODUODENOSCOPY (EGD)     COLONOSCOPY WITH PROPOFOL N/A 04/28/2018   Procedure: COLONOSCOPY WITH PROPOFOL;  Surgeon: Manya Silvas, MD;  Location: [facility] ENDOSCOPY;  Service: Endoscopy;  Laterality: N/A;   DIALYSIS/PERMA CATHETER INSERTION Right 09/04/2020   Procedure: DIALYSIS/PERMA CATHETER INSERTION;  Surgeon: Katha Cabal, MD;  Location: [facility] CV LAB;  Service: Cardiovascular;  Laterality: Right;   DIALYSIS/PERMA CATHETER REMOVAL Left 10/05/2020   Procedure: DIALYSIS/PERMA CATHETER REMOVAL;  Surgeon: Algernon Huxley, MD;  Location: [facility];  Service: Cardiovascular;  Laterality: Left;   EYE  SURGERY     GRAFT APPLICATION     in neck   LAPAROSCOPIC BILATERAL SALPINGO OOPHERECTOMY     NEUROPLASTY / TRANSPOSITION ULNAR NERVE AT ELBOW     OOPHORECTOMY     SKIN CANCER EXCISION     skin grafts   left forearm; hand bil.lower extremeties   titanium plate     TONSILLECTOMY     TUBAL LIGATION      Current Facility-Administered Medications:    acetaminophen (TYLENOL) tablet 650 mg, 650 mg, Oral, Q6H PRN **OR** acetaminophen (TYLENOL) suppository 650 mg, 650 mg, Rectal, Q6H PRN, Clarnce Flock, MD   amLODipine (NORVASC) tablet 10 mg, 10 mg, Oral, Daily, Clarnce Flock, MD, 10 mg at 03/17/21 0950   azithromycin (ZITHROMAX) tablet 500 mg, 500 mg, Oral, Daily, Clarnce Flock, MD, 500 mg at 03/17/21 0950   carbidopa-levodopa (SINEMET IR) 25-100 MG per tablet immediate release 1 tablet, 1 tablet, Oral, TID, Clarnce Flock, MD, 1 tablet at 03/17/21 0546   cephALEXin (KEFLEX) capsule 500 mg, 500 mg, Oral, Q8H, Florencia Reasons, MD   guaiFENesin (MUCINEX) 12 hr tablet 600 mg, 600 mg, Oral, BID, Florencia Reasons, MD, 600 mg at 03/17/21 0950   HYDROmorphone (DILAUDID) injection 0.5-1 mg, 0.5-1 mg, Intravenous, Q2H PRN, Clarnce Flock, MD, 1 mg at 03/16/21 1154   ipratropium-albuterol (DUONEB) 0.5-2.5 (3) MG/3ML nebulizer solution 3 mL, 3 mL, Nebulization, Q6H PRN, Clarnce Flock, MD   ipratropium-albuterol (DUONEB) 0.5-2.5 (3) MG/3ML nebulizer solution 3 mL, 3 mL, Nebulization, BID, Florencia Reasons, MD, 3 mL at 03/17/21 0732   lactated ringers 1,000 mL with potassium chloride 20 mEq infusion, , Intravenous, Continuous, Clarnce Flock, MD, Last Rate: 125 mL/hr at 03/17/21 1003, New Bag at 03/17/21 1003   multivitamin (RENA-VIT) tablet 1 tablet, 1 tablet, Oral, QHS, Clarnce Flock, MD, 1 tablet at 03/16/21 2007   ondansetron (ZOFRAN) tablet 4 mg, 4 mg, Oral, Q6H PRN **OR** ondansetron (ZOFRAN) injection 4 mg, 4 mg, Intravenous, Q6H PRN, Clarnce Flock, MD   oxyCODONE (Oxy IR/ROXICODONE)  immediate release tablet 5 mg, 5 mg, Oral, Q4H PRN, Clarnce Flock, MD, 5 mg at 03/17/21 0145   pantoprazole (PROTONIX) injection 40 mg, 40 mg, Intravenous, Q12H, Clarnce Flock, MD, 40 mg at 03/17/21 0951   polyethylene glycol (MIRALAX / GLYCOLAX) packet 17 g, 17 g, Oral, Daily PRN, Clarnce Flock, MD   senna-docusate (Senokot-S) tablet 1 tablet, 1 tablet, Oral, BID, Florencia Reasons, MD, 1 tablet at 03/16/21 2006   sodium chloride flush (NS) 0.9 % injection 3 mL, 3 mL, Intravenous, Q12H, Clarnce Flock, MD, 3 mL at 03/16/21 2011   traZODone (DESYREL) tablet 25 mg, 25 mg, Oral, QHS PRN, Clarnce Flock, MD   vitamin B-12 (CYANOCOBALAMIN) tablet 1,000 mcg,  1,000 mcg, Oral, Daily, Clarnce Flock, MD, 1,000 mcg at 03/17/21 6286    Family History  Problem Relation Age of Onset   Breast cancer Mother    Colon cancer Mother    Arthritis Mother    Lung cancer Father      Social History   Tobacco Use   Smoking status: Former    Packs/day: 1.00    Years: 20.00    Pack years: 20.00    Types: Cigarettes    Quit date: 07/14/2006    Years since quitting: 14.6   Smokeless tobacco: Never  Vaping Use   Vaping Use: Every day  Substance Use Topics   Alcohol use: Never   Drug use: Never    Allergies as of 03/15/2021 - Review Complete 03/15/2021  Allergen Reaction Noted   Codeine Nausea And Vomiting 03/08/2018   Other  10/05/2020   Prednisone  04/27/2018    Review of Systems:    All systems reviewed and negative except where noted in HPI.   Physical Exam:  Vital signs in last 24 hours: Temp:  [98.5 F (36.9 C)-99.3 F (37.4 C)] 99.2 F (37.3 C) (09/04 0723) Pulse Rate:  [90-98] 92 (09/04 0723) Resp:  [16-19] 16 (09/04 0723) BP: (103-111)/(44-86) 103/44 (09/04 0723) SpO2:  [90 %-98 %] 90 % (09/04 0732) Last BM Date: 03/16/21 General:   Pleasant, cooperative in NAD Head:  Normocephalic and atraumatic. Eyes:   No icterus.   Conjunctiva pink. PERRLA. Ears:  Normal  auditory acuity. Neck:  Supple; no masses or thyroidomegaly Lungs: Respirations even and unlabored. Lungs clear to auscultation bilaterally.   No wheezes, crackles, or rhonchi.  Heart:  Regular rate and rhythm;  Without murmur, clicks, rubs or gallops Abdomen:  Soft, nondistended, nontender. Normal bowel sounds. No appreciable masses or hepatomegaly.  No rebound or guarding.  Rectal:  Not performed. Msk:  Symmetrical without gross deformities.  Strength normal Extremities:  Without edema, cyanosis or clubbing. Neurologic:  Alert and oriented x3;  grossly normal neurologically. Skin:  Intact without significant lesions or rashes. Psych:  Alert and cooperative. Normal affect.  LAB RESULTS: CBC Latest Ref Rng & Units 03/17/2021 03/16/2021 03/16/2021  WBC 4.0 - 10.5 K/uL 5.9 6.4 7.6  Hemoglobin 12.0 - 15.0 g/dL 8.4(L) 9.1(L) 8.2(L)  Hematocrit 36.0 - 46.0 % 25.4(L) 28.6(L) 24.7(L)  Platelets 150 - 400 K/uL 541(H) 614(H) 455(H)    BMET BMP Latest Ref Rng & Units 03/17/2021 03/16/2021 03/15/2021  Glucose 70 - 99 mg/dL 108(H) 113(H) 112(H)  BUN 6 - 20 mg/dL <5(L) 5(L) 9  Creatinine 0.44 - 1.00 mg/dL 0.82 0.72 0.83  Sodium 135 - 145 mmol/L 141 140 138  Potassium 3.5 - 5.1 mmol/L 4.1 3.2(L) 2.8(L)  Chloride 98 - 111 mmol/L 101 101 94(L)  CO2 22 - 32 mmol/L 32 32 31  Calcium 8.9 - 10.3 mg/dL 8.8(L) 8.4(L) 9.1    LFT Hepatic Function Latest Ref Rng & Units 03/16/2021 03/15/2021 12/28/2020  Total Protein 6.5 - 8.1 g/dL 5.7(L) 7.6 7.5  Albumin 3.5 - 5.0 g/dL 2.4(L) 3.0(L) 4.3  AST 15 - 41 U/L 24 54(H) 28  ALT 0 - 44 U/L '5 26 12  ' Alk Phosphatase 38 - 126 U/L 58 81 82  Total Bilirubin 0.3 - 1.2 mg/dL 0.5 0.7 0.4  Bilirubin, Direct 0.0 - 0.2 mg/dL - - <0.1     STUDIES: No results found.    Impression / Plan:   ZANIA KALISZ is a 60 y.o. female  with history of COPD, hypertension, Parkinson's, epilepsy, bipolar is admitted with bilateral pneumonia as well as melena with acute anemia Patient is  currently being treated for pneumonia, day 3 of antibiotics, not on oxygen supplementation  Melena with anemia Normal BUN/creatinine, no evidence of iron deficiency Patient prefers further GI work-up to be done as outpatient.  I also offered her that we could perform upper endoscopy tomorrow after checking with anesthesia given her recent pneumonia.  She persistently said that she wants to go home today. Recommend Prilosec 40 mg p.o. twice daily before meals for next 4 weeks Will contact the patient to schedule upper endoscopy as outpatient next week  Thank you for involving me in the care of this patient.      LOS: 0 days   Sherri Sear, MD  03/17/2021, 11:12 AM    Note: This dictation was prepared with Dragon dictation along with smaller phrase technology. Any transcriptional errors that result from this process are unintentional.

## 2021-03-18 LAB — LEGIONELLA PNEUMOPHILA SEROGP 1 UR AG: L. pneumophila Serogp 1 Ur Ag: NEGATIVE

## 2021-03-19 ENCOUNTER — Other Ambulatory Visit: Payer: Self-pay

## 2021-03-19 ENCOUNTER — Telehealth: Payer: Self-pay

## 2021-03-19 DIAGNOSIS — D649 Anemia, unspecified: Secondary | ICD-10-CM

## 2021-03-19 DIAGNOSIS — K921 Melena: Secondary | ICD-10-CM

## 2021-03-19 LAB — TYPE AND SCREEN
ABO/RH(D): O POS
Antibody Screen: NEGATIVE

## 2021-03-19 NOTE — Telephone Encounter (Signed)
Called and home number the number could not be completed at this time.  Called mobile number and scheduled patient for 03/28/2021. Went over instructions with patient sent to [facility] and mailed them

## 2021-03-19 NOTE — Telephone Encounter (Signed)
-----   Message from Lin Landsman, MD sent at 03/17/2021 11:13 AM EDT ----- Regarding: EGD Stacy Pearson  Patient was discharged on 9/4.  Please call her to schedule upper endoscopy once I return from vacation  Dx: Melena and anemia  Thanks RV

## 2021-03-20 ENCOUNTER — Telehealth: Payer: Self-pay | Admitting: Nurse Practitioner

## 2021-03-20 ENCOUNTER — Encounter: Payer: Self-pay | Admitting: Gastroenterology

## 2021-03-20 LAB — CULTURE, BLOOD (SINGLE): Culture: NO GROWTH

## 2021-03-20 NOTE — Telephone Encounter (Signed)
I called Stacy Pearson to schedule f/u pc visit following hospitalization, no answer, message left with contact information to return call

## 2021-03-22 ENCOUNTER — Other Ambulatory Visit: Payer: Medicare HMO | Admitting: Nurse Practitioner

## 2021-03-22 ENCOUNTER — Encounter: Payer: Self-pay | Admitting: Nurse Practitioner

## 2021-03-22 ENCOUNTER — Other Ambulatory Visit: Payer: Self-pay

## 2021-03-22 DIAGNOSIS — Z515 Encounter for palliative care: Secondary | ICD-10-CM

## 2021-03-22 DIAGNOSIS — R630 Anorexia: Secondary | ICD-10-CM

## 2021-03-22 DIAGNOSIS — F419 Anxiety disorder, unspecified: Secondary | ICD-10-CM
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
## 2021-03-27 ENCOUNTER — Telehealth: Payer: Self-pay

## 2021-03-27 NOTE — Telephone Encounter (Signed)
03/27/21 @ 1055 AM: Palliative care SW outreached patient, per PC NP C. Gusler request, to discuss re-establishment of mental health services.  Patient shared that she recently discharged from the hospital with PNA and cracked ribs and would like to put mental health services on hold at this time.   SW acknowledged patients wishes and advised patient to outreach SW when she is ready to re-start mental health services. No other concerns voices at this time.

## 2021-03-28 ENCOUNTER — Ambulatory Visit: Payer: Medicare HMO | Admitting: Anesthesiology

## 2021-03-28 ENCOUNTER — Ambulatory Visit
Admission: RE | Admit: 2021-03-28 | Discharge: 2021-03-28 | Disposition: A | Discharge status: Home / Self Care | Payer: Medicare HMO | Attending: Gastroenterology | Admitting: Gastroenterology

## 2021-03-28 ENCOUNTER — Other Ambulatory Visit
Admission: RE | Admit: 2021-03-28 | Discharge: 2021-03-28 | Disposition: A | Discharge status: Home / Self Care | Payer: Medicare HMO | Source: Home / Self Care | Attending: Gastroenterology | Admitting: Gastroenterology

## 2021-03-28 ENCOUNTER — Other Ambulatory Visit: Payer: Self-pay

## 2021-03-28 ENCOUNTER — Telehealth: Payer: Self-pay

## 2021-03-28 ENCOUNTER — Encounter: Payer: Self-pay | Admitting: Gastroenterology

## 2021-03-28 ENCOUNTER — Encounter
Admission: RE | Disposition: A | Discharge status: Home / Self Care | Payer: Self-pay | Source: Home / Self Care | Attending: Gastroenterology

## 2021-03-28 DIAGNOSIS — Z885 Allergy status to narcotic agent status: Secondary | ICD-10-CM | POA: Insufficient documentation

## 2021-03-28 DIAGNOSIS — J449 Chronic obstructive pulmonary disease, unspecified: Secondary | ICD-10-CM | POA: Insufficient documentation

## 2021-03-28 DIAGNOSIS — D649 Anemia, unspecified: Secondary | ICD-10-CM | POA: Insufficient documentation

## 2021-03-28 DIAGNOSIS — K2100 Gastro-esophageal reflux disease with esophagitis, without bleeding: Secondary | ICD-10-CM | POA: Diagnosis not present

## 2021-03-28 DIAGNOSIS — G40409 Other generalized epilepsy and epileptic syndromes, not intractable, without status epilepticus: Secondary | ICD-10-CM | POA: Insufficient documentation

## 2021-03-28 DIAGNOSIS — K921 Melena: Secondary | ICD-10-CM

## 2021-03-28 DIAGNOSIS — Z85828 Personal history of other malignant neoplasm of skin: Secondary | ICD-10-CM | POA: Insufficient documentation

## 2021-03-28 DIAGNOSIS — I10 Essential (primary) hypertension: Secondary | ICD-10-CM | POA: Insufficient documentation

## 2021-03-28 DIAGNOSIS — Z888 Allergy status to other drugs, medicaments and biological substances status: Secondary | ICD-10-CM | POA: Diagnosis not present

## 2021-03-28 DIAGNOSIS — Z801 Family history of malignant neoplasm of trachea, bronchus and lung: Secondary | ICD-10-CM | POA: Diagnosis not present

## 2021-03-28 DIAGNOSIS — Z7952 Long term (current) use of systemic steroids: Secondary | ICD-10-CM | POA: Insufficient documentation

## 2021-03-28 DIAGNOSIS — Z803 Family history of malignant neoplasm of breast: Secondary | ICD-10-CM | POA: Insufficient documentation

## 2021-03-28 DIAGNOSIS — G629 Polyneuropathy, unspecified: Secondary | ICD-10-CM | POA: Diagnosis not present

## 2021-03-28 DIAGNOSIS — Z800 Family history of malignant neoplasm of digestive organs: Secondary | ICD-10-CM | POA: Diagnosis not present

## 2021-03-28 DIAGNOSIS — G20 Parkinson's disease: Secondary | ICD-10-CM | POA: Diagnosis not present

## 2021-03-28 DIAGNOSIS — Z87891 Personal history of nicotine dependence: Secondary | ICD-10-CM | POA: Insufficient documentation

## 2021-03-28 DIAGNOSIS — Z79899 Other long term (current) drug therapy: Secondary | ICD-10-CM | POA: Insufficient documentation

## 2021-03-28 HISTORY — DX: Family history of other specified conditions: Z84.89

## 2021-03-28 HISTORY — DX: Presence of dental prosthetic device (complete) (partial): Z97.2

## 2021-03-28 HISTORY — PX: ESOPHAGOGASTRODUODENOSCOPY (EGD) WITH PROPOFOL: SHX5813

## 2021-03-28 LAB — CBC
HCT: 31.1 % — ABNORMAL LOW (ref 36.0–46.0)
Hemoglobin: 9.9 g/dL — ABNORMAL LOW (ref 12.0–15.0)
MCH: 29.9 pg (ref 26.0–34.0)
MCHC: 31.8 g/dL (ref 30.0–36.0)
MCV: 94 fL (ref 80.0–100.0)
Platelets: 689 10*3/uL — ABNORMAL HIGH (ref 150–400)
RBC: 3.31 MIL/uL — ABNORMAL LOW (ref 3.87–5.11)
RDW: 14.4 % (ref 11.5–15.5)
WBC: 7.3 10*3/uL (ref 4.0–10.5)
nRBC: 0 % (ref 0.0–0.2)

## 2021-03-28 SURGERY — ESOPHAGOGASTRODUODENOSCOPY (EGD) WITH PROPOFOL
Anesthesia: General

## 2021-03-28 MED ORDER — SODIUM CHLORIDE 0.9 % IV SOLN: INTRAVENOUS | Status: DC

## 2021-03-28 MED ORDER — GLYCOPYRROLATE 0.2 MG/ML IJ SOLN
INTRAMUSCULAR | Status: DC | PRN
  Administered 2021-03-28: .1 mg via INTRAVENOUS

## 2021-03-28 MED ORDER — ACETAMINOPHEN 160 MG/5ML PO SOLN: 325.0000 mg | Freq: Once | ORAL | Status: DC

## 2021-03-28 MED ORDER — LACTATED RINGERS IV SOLN: INTRAVENOUS | Status: DC

## 2021-03-28 MED ORDER — PROPOFOL 10 MG/ML IV BOLUS
INTRAVENOUS | Status: DC | PRN
  Administered 2021-03-28: 150 mg via INTRAVENOUS
  Administered 2021-03-28: 40 mg via INTRAVENOUS

## 2021-03-28 MED ORDER — ACETAMINOPHEN 325 MG PO TABS: 325.0000 mg | ORAL_TABLET | Freq: Once | ORAL | Status: DC

## 2021-03-28 MED ORDER — LIDOCAINE HCL (CARDIAC) PF 100 MG/5ML IV SOSY
PREFILLED_SYRINGE | INTRAVENOUS | Status: DC | PRN
  Administered 2021-03-28: 30 mg via INTRAVENOUS

## 2021-03-28 SURGICAL SUPPLY — 10 items
BLOCK BITE 60FR ADLT L/F GRN (MISCELLANEOUS) ×2 IMPLANT
ELECT REM PT RETURN 9FT ADLT (ELECTROSURGICAL)
ELECTRODE REM PT RTRN 9FT ADLT (ELECTROSURGICAL) IMPLANT
GOWN CVR UNV OPN BCK APRN NK (MISCELLANEOUS) ×2 IMPLANT
GOWN ISOL THUMB LOOP REG UNIV (MISCELLANEOUS) ×4
KIT DEFENDO VALVE AND CONN (KITS) IMPLANT
KIT PRC NS LF DISP ENDO (KITS) ×1 IMPLANT
KIT PROCEDURE OLYMPUS (KITS) ×2
MANIFOLD NEPTUNE II (INSTRUMENTS) ×2 IMPLANT
WATER STERILE IRR 250ML POUR (IV SOLUTION) ×2 IMPLANT

## 2021-03-28 NOTE — Anesthesia Preprocedure Evaluation (Signed)
Anesthesia Evaluation  Patient identified by MRN, date of birth, ID band Patient awake    Reviewed: Allergy & Precautions, H&P , NPO status , Patient's Chart, lab work & pertinent test results  Airway Mallampati: II  TM Distance: >3 FB Neck ROM: full    Dental no notable dental hx. (+) Upper Dentures, Lower Dentures   Pulmonary asthma ,  COPD inhaler, former smoker,    Pulmonary exam normal breath sounds clear to auscultation       Cardiovascular hypertension, Normal cardiovascular exam Rhythm:regular Rate:Normal     Neuro/Psych Seizures -,  PSYCHIATRIC DISORDERS Anxiety Bipolar Disorder Parkinson   GI/Hepatic GERD  ,  Endo/Other    Renal/GU      Musculoskeletal   Abdominal   Peds  Hematology   Anesthesia Other Findings   Reproductive/Obstetrics                             Anesthesia Physical Anesthesia Plan  ASA: 3  Anesthesia Plan: General   Post-op Pain Management:    Induction: Intravenous  PONV Risk Score and Plan: 3 and Treatment may vary due to age or medical condition, Propofol infusion and TIVA  Airway Management Planned: Natural Airway  Additional Equipment:   Intra-op Plan:   Post-operative Plan:   Informed Consent: I have reviewed the patients History and Physical, chart, labs and discussed the procedure including the risks, benefits and alternatives for the proposed anesthesia with the patient or authorized representative who has indicated his/her understanding and acceptance.     Dental Advisory Given  Plan Discussed with: CRNA  Anesthesia Plan Comments:         Anesthesia Quick Evaluation

## 2021-03-28 NOTE — Op Note (Signed)
[facility]astroenterology Patient Name: Stacy Pearson Procedure Date: 03/28/2021 7:40 AM MRN: [id number] Account #: [id number] Date of Birth: 09-Nov-1960 Admit Type: Outpatient Age: 60 Room: [facility] OR ROOM 01 Gender: Female Note Status: Finalized Instrument Name: [id number] Procedure:             Upper GI endoscopy Indications:           Melena, Recent gastrointestinal bleeding Providers:             Lin Landsman MD, MD Referring MD:          Wynona Canes. Kym Groom, MD (Referring MD) Medicines:             General Anesthesia Complications:         No immediate complications. Estimated blood loss: None. Procedure:             Pre-Anesthesia Assessment:                        - Prior to the procedure, a History and Physical was                         performed, and patient medications and allergies were                         reviewed. The patient is competent. The risks and                         benefits of the procedure and the sedation options and                         risks were discussed with the patient. All questions                         were answered and informed consent was obtained.                         Patient identification and proposed procedure were                         verified by the physician, the nurse, the                         anesthesiologist, the anesthetist and the technician                         in the pre-procedure area in the procedure room in the                         endoscopy suite. Mental Status Examination: alert and                         oriented. Airway Examination: normal oropharyngeal                         airway and neck mobility. Respiratory Examination:                         clear to auscultation. CV Examination: normal.  Prophylactic Antibiotics: The patient does not require                         prophylactic antibiotics. Prior Anticoagulants: The                         patient  has taken no previous anticoagulant or                         antiplatelet agents. ASA Grade Assessment: III - A                         patient with severe systemic disease. After reviewing                         the risks and benefits, the patient was deemed in                         satisfactory condition to undergo the procedure. The                         anesthesia plan was to use general anesthesia.                         Immediately prior to administration of medications,                         the patient was re-assessed for adequacy to receive                         sedatives. The heart rate, respiratory rate, oxygen                         saturations, blood pressure, adequacy of pulmonary                         ventilation, and response to care were monitored                         throughout the procedure. The physical status of the                         patient was re-assessed after the procedure.                        After obtaining informed consent, the endoscope was                         passed under direct vision. Throughout the procedure,                         the patient's blood pressure, pulse, and oxygen                         saturations were monitored continuously. The Endoscope                         was introduced through the mouth, and advanced to the  second part of duodenum. The upper GI endoscopy was                         accomplished without difficulty. The patient tolerated                         the procedure well. Findings:      The duodenal bulb and second portion of the duodenum were normal.      The entire examined stomach was normal.      The cardia and gastric fundus were normal on retroflexion.      Esophagogastric landmarks were identified: the gastroesophageal junction       was found at 36 cm from the incisors.      LA Grade B (one or more mucosal breaks greater than 5 mm, not extending       between  the tops of two mucosal folds) esophagitis with no bleeding was       found in the lower third of the esophagus. Impression:            - Normal duodenal bulb and second portion of the                         duodenum.                        - Normal stomach.                        - Esophagogastric landmarks identified.                        - LA Grade B reflux esophagitis with no bleeding.                        - No specimens collected. Recommendation:        - Discharge patient to home (with parent).                        - Resume previous diet today.                        - Continue present medications.                        - Follow an antireflux regimen.                        - Use a proton pump inhibitor PO BID for 3 months.                        - Return to my office in 3 months. Procedure Code(s):     --- Professional ---                        [phone], Esophagogastroduodenoscopy, flexible,                         transoral; diagnostic, including collection of                         specimen(s) by brushing or washing,  when performed                         (separate procedure) Diagnosis Code(s):     --- Professional ---                        K21.00, Gastro-esophageal reflux disease with                         esophagitis, without bleeding                        K92.1, Melena (includes Hematochezia)                        K92.2, Gastrointestinal hemorrhage, unspecified CPT copyright 2019 American Medical Association. All rights reserved. The codes documented in this report are preliminary and upon coder review may  be revised to meet current compliance requirements. Dr. Ulyess Mort Lin Landsman MD, MD 03/28/2021 7:57:31 AM This report has been signed electronically. Number of Addenda: 0 Note Initiated On: 03/28/2021 7:40 AM Total Procedure Duration: 0 hours 3 minutes 23 seconds  Estimated Blood Loss:  Estimated blood loss: none.      [facility]

## 2021-03-28 NOTE — Telephone Encounter (Signed)
Called and left a message for call back  

## 2021-03-28 NOTE — Telephone Encounter (Signed)
Per Dr. Marius Ditch please order CBC, she will go to lab on 2nd floor and she will need colonoscopy.  Order CBC

## 2021-03-28 NOTE — Telephone Encounter (Signed)
-----   Message from Lin Landsman, MD sent at 03/28/2021  4:46 PM EDT ----- Hemoglobin slightly better but still low and platelet counts are high.  Recommend urgent referral to hematology if patient is agreeable Also, schedule colonoscopy Acute anemia, melena, normal EGD  RV

## 2021-03-28 NOTE — Anesthesia Procedure Notes (Signed)
Date/Time: 03/28/2021 7:49 AM Performed by: Cameron Ali, CRNA Pre-anesthesia Checklist: Patient identified, Emergency Drugs available, Suction available, Timeout performed and Patient being monitored Patient Re-evaluated:Patient Re-evaluated prior to induction Oxygen Delivery Method: Nasal cannula Placement Confirmation: positive ETCO2

## 2021-03-28 NOTE — Transfer of Care (Signed)
Immediate Anesthesia Transfer of Care Note  Patient: Stacy Pearson  Procedure(s) Performed: ESOPHAGOGASTRODUODENOSCOPY (EGD) WITH PROPOFOL  Patient Location: PACU  Anesthesia Type: General  Level of Consciousness: awake, alert  and patient cooperative  Airway and Oxygen Therapy: Patient Spontanous Breathing and Patient connected to supplemental oxygen  Post-op Assessment: Post-op Vital signs reviewed, Patient's Cardiovascular Status Stable, Respiratory Function Stable, Patent Airway and No signs of Nausea or vomiting  Post-op Vital Signs: Reviewed and stable  Complications: No notable events documented.

## 2021-03-28 NOTE — H&P (Signed)
Cephas Darby, MD [address]  [place], [place] [address]  Main: [phone]  Fax: [phone] Pager: [phone]  Primary Care Physician:  Valera Castle, MD Primary Gastroenterologist:  Dr. Cephas Darby  Pre-Procedure History & Physical: HPI:  Stacy Pearson is a 60 y.o. female is here for an endoscopy.   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[place])    GERD (gastroesophageal reflux disease)    Headache    History of basal cell carcinoma    Hypertension    Neuropathy    Parkinson's disease ([place])    Parkinson's disease ([place])    Parkinsonism ([place])    Wears dentures    full upper and lower    Past Surgical History:  Procedure Laterality Date   ABDOMINAL HYSTERECTOMY     ADENOIDECTOMY     BLADDER SURGERY     bone grafts     CATARACT EXTRACTION W/ INTRAOCULAR LENS IMPLANT     CERVICAL FUSION     COLONOSCOPY     COLONOSCOPY WITH ESOPHAGOGASTRODUODENOSCOPY (EGD)     COLONOSCOPY WITH PROPOFOL N/A 04/28/2018   Procedure: COLONOSCOPY WITH PROPOFOL;  Surgeon: Manya Silvas, MD;  Location: [facility] ENDOSCOPY;  Service: Endoscopy;  Laterality: N/A;   DIALYSIS/PERMA CATHETER INSERTION Right 09/04/2020   Procedure: DIALYSIS/PERMA CATHETER INSERTION;  Surgeon: Katha Cabal, MD;  Location: [place] CV LAB;  Service: Cardiovascular;  Laterality: Right;   DIALYSIS/PERMA CATHETER REMOVAL Left 10/05/2020   Procedure: DIALYSIS/PERMA CATHETER REMOVAL;  Surgeon: Algernon Huxley, MD;  Location: [place] CV LAB;  Service: Cardiovascular;  Laterality: Left;   EYE SURGERY     GRAFT APPLICATION     in neck   LAPAROSCOPIC BILATERAL SALPINGO OOPHERECTOMY     NEUROPLASTY / TRANSPOSITION ULNAR NERVE AT ELBOW     OOPHORECTOMY     SKIN CANCER EXCISION     skin grafts   left forearm; hand bil.lower extremeties   titanium plate     TONSILLECTOMY     TUBAL LIGATION      Prior to Admission medications   Medication Sig Start Date End Date Taking? Authorizing Provider  acetaminophen (TYLENOL) 325 MG tablet Take 1 tablet (325 mg total) by mouth every 6 (six) hours as needed for mild pain (or Fever >/= 101). 03/17/21  Yes Florencia Reasons, MD  amLODipine (NORVASC) 10 MG tablet Take 1 tablet (10 mg total) by mouth daily. 09/11/20  Yes Fritzi Mandes, MD  carbidopa-levodopa (SINEMET IR) 25-100 MG tablet Take 1 tablet by mouth 3 (three) times daily. 09/10/20  Yes Fritzi Mandes, MD  cyanocobalamin 1000 MCG tablet Take 1 tablet (1,000 mcg total) by mouth daily. 03/17/21  Yes Florencia Reasons, MD  guaiFENesin (MUCINEX) 600 MG 12 hr tablet Take 1 tablet (600 mg total) by mouth 2 (two) times daily. 03/17/21  Yes Florencia Reasons, MD  Ipratropium-Albuterol (COMBIVENT) 20-100 MCG/ACT AERS respimat Inhale 1 puff into the lungs every 6 (six) hours.   Yes [provider]  Omega-3 1000 MG CAPS Take 1 capsule (1,000 mg total) by mouth daily at 12 noon. Hold for now 03/17/21  Yes Florencia Reasons, MD  ondansetron (ZOFRAN) 8 MG tablet Take 8 mg by mouth 3 (three) times daily. 03/07/21  Yes [provider]  oxyCODONE (OXY IR/ROXICODONE) 5 MG immediate release tablet Take 1 tablet (5 mg total) by mouth every 6 (six) hours as needed for moderate pain. 03/17/21  Yes Florencia Reasons, MD  pantoprazole (PROTONIX) 40 MG tablet Take 1 tablet (40 mg total) by mouth 2 (two) times daily. 03/17/21 04/16/21 Yes Florencia Reasons, MD  EPINEPHrine 0.3 mg/0.3 mL IJ SOAJ injection Inject into the muscle once.    [provider]    Allergies as of  03/19/2021 - Review Complete 03/15/2021  Allergen Reaction Noted   Codeine Nausea And Vomiting 03/08/2018   Other  10/05/2020   Prednisone  04/27/2018    Family History  Problem Relation Age of Onset   Breast cancer Mother    Colon cancer Mother    Arthritis Mother    Lung cancer Father     Social History   Socioeconomic History   Marital status: Single    Spouse name: Not on file   Number of children: Not on file   Years of education: Not on file   Highest education level: Not on file  Occupational History   Not on file  Tobacco Use   Smoking status: Former    Packs/day: 1.00    Years: 20.00    Pack years: 20.00    Types: Cigarettes    Quit date: 07/14/2006    Years since quitting: 14.7   Smokeless tobacco: Never  Vaping Use   Vaping Use: Former  Substance and Sexual Activity   Alcohol use: Never   Drug use: Never   Sexual activity: Not on file  Other Topics Concern   Not on file  Social History Narrative   Not on file   Social Determinants of Health   Financial Resource Strain: Not on file  Food Insecurity: Not on file  Transportation Needs: Not on file  Physical Activity: Not on file  Stress: Not on file  Social Connections: Not on file  Intimate Partner Violence: Not on file    Review of Systems: See HPI, otherwise negative ROS  Physical Exam: BP 135/71   Pulse 91   Temp 97.7 F (36.5 C) (Temporal)   Resp 16   Ht 5\' 2"  (1.575 m)   Wt 54 kg   SpO2 100%   BMI 21.77 kg/m  General:   Alert,  pleasant and cooperative in NAD Head:  Normocephalic and atraumatic. Neck:  Supple; no masses or thyromegaly. Lungs:  Clear throughout to auscultation.    Heart:  Regular rate and rhythm. Abdomen:  Soft, nontender and nondistended. Normal bowel sounds, without guarding, and without rebound.   Neurologic:  Alert and  oriented x4;  grossly normal neurologically.  Impression/Plan: Stacy Pearson is here for an endoscopy to be performed for  melena  Risks, benefits, limitations, and alternatives regarding  endoscopy have been reviewed with the patient.  Questions have been answered.  All parties agreeable.   Sherri Sear, MD  03/28/2021, 7:37 AM

## 2021-03-28 NOTE — Anesthesia Postprocedure Evaluation (Signed)
Anesthesia Post Note  Patient: Stacy Pearson  Procedure(s) Performed: ESOPHAGOGASTRODUODENOSCOPY (EGD) WITH PROPOFOL     Patient location during evaluation: PACU Anesthesia Type: General Level of consciousness: awake and alert and oriented Pain management: satisfactory to patient Vital Signs Assessment: post-procedure vital signs reviewed and stable Respiratory status: spontaneous breathing, nonlabored ventilation and respiratory function stable Cardiovascular status: blood pressure returned to baseline and stable Postop Assessment: Adequate PO intake and No signs of nausea or vomiting Anesthetic complications: no   No notable events documented.  Raliegh Ip

## 2021-03-29 ENCOUNTER — Encounter: Payer: Self-pay | Admitting: Gastroenterology

## 2021-03-29 NOTE — Telephone Encounter (Signed)
Called and left a message for call back  

## 2021-04-01 NOTE — Telephone Encounter (Signed)
Called and left a message for call back. Sent mychart message  °

## 2021-04-03 ENCOUNTER — Other Ambulatory Visit: Payer: Self-pay

## 2021-04-03 ENCOUNTER — Other Ambulatory Visit: Payer: Medicare HMO | Admitting: Nurse Practitioner

## 2021-04-03 ENCOUNTER — Telehealth: Payer: Self-pay | Admitting: Nurse Practitioner

## 2021-04-03 NOTE — Telephone Encounter (Signed)
I called Stacy Pearson to confirm pc visit, message left with contact information. Stacy Pearson returned call, discussed visit and in agreement to reschedule farther out as Stacy Pearson endorses she has been slowly improving. Rescheduled per request

## 2021-04-16 ENCOUNTER — Encounter: Payer: Self-pay | Admitting: Intensive Care

## 2021-04-16 ENCOUNTER — Emergency Department: Payer: Medicare HMO

## 2021-04-16 ENCOUNTER — Other Ambulatory Visit: Payer: Self-pay

## 2021-04-16 ENCOUNTER — Emergency Department
Admission: EM | Admit: 2021-04-16 | Discharge: 2021-04-16 | Disposition: A | Discharge status: Home / Self Care | Payer: Medicare HMO | Attending: Emergency Medicine | Admitting: Emergency Medicine

## 2021-04-16 DIAGNOSIS — R109 Unspecified abdominal pain: Secondary | ICD-10-CM | POA: Insufficient documentation

## 2021-04-16 DIAGNOSIS — Z87891 Personal history of nicotine dependence: Secondary | ICD-10-CM | POA: Diagnosis not present

## 2021-04-16 DIAGNOSIS — I10 Essential (primary) hypertension: Secondary | ICD-10-CM | POA: Insufficient documentation

## 2021-04-16 DIAGNOSIS — J449 Chronic obstructive pulmonary disease, unspecified: Secondary | ICD-10-CM | POA: Diagnosis not present

## 2021-04-16 DIAGNOSIS — Z7951 Long term (current) use of inhaled steroids: Secondary | ICD-10-CM | POA: Insufficient documentation

## 2021-04-16 DIAGNOSIS — R0781 Pleurodynia: Secondary | ICD-10-CM

## 2021-04-16 DIAGNOSIS — Z85828 Personal history of other malignant neoplasm of skin: Secondary | ICD-10-CM | POA: Insufficient documentation

## 2021-04-16 DIAGNOSIS — G20 Parkinson's disease: Secondary | ICD-10-CM | POA: Diagnosis not present

## 2021-04-16 DIAGNOSIS — Z79899 Other long term (current) drug therapy: Secondary | ICD-10-CM | POA: Diagnosis not present

## 2021-04-16 DIAGNOSIS — R059 Cough, unspecified: Secondary | ICD-10-CM | POA: Diagnosis not present

## 2021-04-16 DIAGNOSIS — J45909 Unspecified asthma, uncomplicated: Secondary | ICD-10-CM | POA: Diagnosis not present

## 2021-04-16 LAB — CBC WITH DIFFERENTIAL/PLATELET
Abs Immature Granulocytes: 0.02 10*3/uL (ref 0.00–0.07)
Basophils Absolute: 0.1 10*3/uL (ref 0.0–0.1)
Basophils Relative: 1 %
Eosinophils Absolute: 0.1 10*3/uL (ref 0.0–0.5)
Eosinophils Relative: 2 %
HCT: 34.8 % — ABNORMAL LOW (ref 36.0–46.0)
Hemoglobin: 11.5 g/dL — ABNORMAL LOW (ref 12.0–15.0)
Immature Granulocytes: 0 %
Lymphocytes Relative: 24 %
Lymphs Abs: 1.5 10*3/uL (ref 0.7–4.0)
MCH: 30.5 pg (ref 26.0–34.0)
MCHC: 33 g/dL (ref 30.0–36.0)
MCV: 92.3 fL (ref 80.0–100.0)
Monocytes Absolute: 0.4 10*3/uL (ref 0.1–1.0)
Monocytes Relative: 6 %
Neutro Abs: 4.2 10*3/uL (ref 1.7–7.7)
Neutrophils Relative %: 67 %
Platelets: 442 10*3/uL — ABNORMAL HIGH (ref 150–400)
RBC: 3.77 MIL/uL — ABNORMAL LOW (ref 3.87–5.11)
RDW: 13.9 % (ref 11.5–15.5)
WBC: 6.4 10*3/uL (ref 4.0–10.5)
nRBC: 0 % (ref 0.0–0.2)

## 2021-04-16 LAB — BASIC METABOLIC PANEL
Anion gap: 11 (ref 5–15)
BUN: 17 mg/dL (ref 6–20)
CO2: 26 mmol/L (ref 22–32)
Calcium: 9.4 mg/dL (ref 8.9–10.3)
Chloride: 99 mmol/L (ref 98–111)
Creatinine, Ser: 1.01 mg/dL — ABNORMAL HIGH (ref 0.44–1.00)
GFR, Estimated: >60 mL/min (ref >60)
Glucose, Bld: 104 mg/dL — ABNORMAL HIGH (ref 70–99)
Potassium: 4.1 mmol/L (ref 3.5–5.1)
Sodium: 136 mmol/L (ref 135–145)

## 2021-04-16 MED ORDER — HYDROCODONE-ACETAMINOPHEN 5-325 MG PO TABS: 1.0000 | ORAL_TABLET | Freq: Four times a day (QID) | ORAL | 0 refills | Status: AC | PRN

## 2021-04-16 NOTE — ED Triage Notes (Signed)
Patient had injury last week and diagnosed with right sided rib cage fracture yesterday. Came in today for cough and pain control.

## 2021-04-16 NOTE — ED Notes (Signed)
Patient given discharge instructions, all questions answered. Patient in possession of all belongings, directed to the discharge area  

## 2021-04-16 NOTE — Discharge Instructions (Addendum)
The ultrasound and chest x-ray did not show anything new.  The pneumonia appears to be improving.  Please follow-up with your doctor in a week or 2 to make sure you are getting better.  I have given you some Vicodin which you can use for your pain if Tylenol or Advil are not working enough.  Be careful not to take the Tylenol with the Vicodin as there is Tylenol and Vicodin and you could overdose on the Tylenol.  Please return for increasing pain fever lightheadedness or any other problems.

## 2021-04-16 NOTE — ED Provider Notes (Addendum)
[facility] Emergency Department Provider Note   ____________________________________________   Event Date/Time   First MD Initiated Contact with Patient 04/16/21 1812     (approximate)  I have reviewed the triage vital signs and the nursing notes.   HISTORY  Chief Complaint Cough and Rib Injury    HPI Stacy Pearson is a 60 y.o. female who reports she fell and sustained a rib fracture on the 30th.  This is in the right lower chest.  Pain seems to be getting worse or at least not improving.  There is also now pain in the abdomen on the right side.  This pain is worse if she takes a deep breath or moves.  She has some residual cough.  She had a pneumonia as well.        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[place])    GERD (gastroesophageal reflux disease)    Headache    History of basal cell carcinoma    Hypertension    Neuropathy    Parkinson's disease ([place])    Parkinson's disease ([place])    Parkinsonism ([place])    Wears dentures    full upper and lower    Patient Active Problem List   Diagnosis Date Noted   Gastroesophageal reflux disease with esophagitis without hemorrhage    Pneumonia 03/15/2021   Gastrointestinal hemorrhage with melena 03/15/2021   Epilepsy ([place]) 08/26/2020   Depression with anxiety 08/26/2020   HTN (hypertension) 29/93/7169   Acute metabolic encephalopathy 67/89/3810   UTI (urinary tract infection) 08/26/2020   AKI (acute kidney injury) ([place])  08/26/2020   Severe sepsis with septic shock ([place]) 08/26/2020   Delirium 08/26/2020   Acute diarrhea 03/08/2018   Status post cervical spinal fusion 12/31/2017   Cervical spondylosis with radiculopathy 10/28/2017   Colon polyp 10/08/2017   Essential tremor 10/08/2017   Cervical radiculopathy 09/03/2017   Allergic rhinitis 12/30/2016   Sting of hornets, wasps, and bees causing poisoning and toxic reactions 12/30/2016   Allergic rhinitis due to dogs 12/17/2016   Asthma, currently active 12/17/2016   Muscle cramps 11/18/2016   Sleep disorder 08/25/2016   At risk for falls 08/19/2016   Elevated vitamin B12 level 08/19/2016   History of fall within past 90 days 08/19/2016   Nausea and vomiting 08/19/2016   Parkinson's disease ([place]) 08/19/2016   Diaphoresis 03/25/2016   Dry scalp 03/25/2016   Dry skin 03/25/2016   Back pain 07/04/2015   Tremor 01/02/2014   Asthma 07/28/2013   COPD (chronic obstructive pulmonary disease) ([place]) 07/28/2013   Ulnar neuropathy of left upper extremity 06/06/2013   Meibomian gland dysfunction 11/09/2012   Raynaud phenomenon 05/03/2012   Anorexia nervosa with bulimia 01/17/2011   Basal cell carcinoma of other specified sites of skin 01/17/2011   Dyspepsia and other specified disorders of function of stomach 01/17/2011   Generalized  anxiety disorder 01/17/2011   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) 01/17/2011   Migraine without aura 01/17/2011   Other atopic dermatitis and related conditions 01/17/2011   Bipolar 1 disorder ([place]) 04/28/1995    Past Surgical History:  Procedure Laterality Date   ABDOMINAL HYSTERECTOMY     ADENOIDECTOMY     BLADDER SURGERY     bone grafts     CATARACT EXTRACTION W/ INTRAOCULAR LENS IMPLANT     CERVICAL FUSION     COLONOSCOPY     COLONOSCOPY WITH ESOPHAGOGASTRODUODENOSCOPY (EGD)     COLONOSCOPY WITH PROPOFOL N/A 04/28/2018   Procedure: COLONOSCOPY WITH PROPOFOL;  Surgeon: Manya Silvas, MD;  Location: [facility]  ENDOSCOPY;  Service: Endoscopy;  Laterality: N/A;   DIALYSIS/PERMA CATHETER INSERTION Right 09/04/2020   Procedure: DIALYSIS/PERMA CATHETER INSERTION;  Surgeon: Katha Cabal, MD;  Location: [facility];  Service: Cardiovascular;  Laterality: Right;   DIALYSIS/PERMA CATHETER REMOVAL Left 10/05/2020   Procedure: DIALYSIS/PERMA CATHETER REMOVAL;  Surgeon: Algernon Huxley, MD;  Location: [facility];  Service: Cardiovascular;  Laterality: Left;   ESOPHAGOGASTRODUODENOSCOPY (EGD) WITH PROPOFOL N/A 03/28/2021   Procedure: ESOPHAGOGASTRODUODENOSCOPY (EGD) WITH PROPOFOL;  Surgeon: Lin Landsman, MD;  Location: [facility];  Service: Endoscopy;  Laterality: N/A;   EYE SURGERY     GRAFT APPLICATION     in neck   LAPAROSCOPIC BILATERAL SALPINGO OOPHERECTOMY     NEUROPLASTY / TRANSPOSITION ULNAR NERVE AT ELBOW     OOPHORECTOMY     SKIN CANCER EXCISION     skin grafts   left forearm; hand bil.lower extremeties   titanium plate     TONSILLECTOMY     TUBAL LIGATION      Prior to Admission medications   Medication Sig Start Date End Date Taking? Authorizing Provider  HYDROcodone-acetaminophen (NORCO/VICODIN) 5-325 MG tablet Take 1 tablet by mouth every 6 (six) hours as needed for moderate pain. 04/16/21  Yes Nena Polio, MD  acetaminophen (TYLENOL) 325 MG tablet Take 1 tablet (325 mg total) by mouth every 6 (six) hours as needed for mild pain (or Fever >/= 101). 03/17/21   Florencia Reasons, MD  amLODipine (NORVASC) 10 MG tablet Take 1 tablet (10 mg total) by mouth daily. 09/11/20   Fritzi Mandes, MD  carbidopa-levodopa (SINEMET IR) 25-100 MG tablet Take 1 tablet by mouth 3 (three) times daily. 09/10/20   Fritzi Mandes, MD  cyanocobalamin 1000 MCG tablet Take 1 tablet (1,000 mcg total) by mouth daily. 03/17/21   Florencia Reasons, MD  EPINEPHrine 0.3 mg/0.3 mL IJ SOAJ injection Inject into the muscle once.    [provider]  guaiFENesin (MUCINEX) 600 MG 12 hr tablet Take 1 tablet (600 mg  total) by mouth 2 (two) times daily. 03/17/21   Florencia Reasons, MD  Ipratropium-Albuterol (COMBIVENT) 20-100 MCG/ACT AERS respimat Inhale 1 puff into the lungs every 6 (six) hours.    [provider]  Omega-3 1000 MG CAPS Take 1 capsule (1,000 mg total) by mouth daily at 12 noon. Hold for now 03/17/21   Florencia Reasons, MD  ondansetron (ZOFRAN) 8 MG tablet Take 8 mg by mouth 3 (three) times daily. 03/07/21   [provider]  oxyCODONE (OXY IR/ROXICODONE) 5 MG immediate release tablet Take 1 tablet (5 mg total) by mouth every 6 (six) hours as needed for moderate pain. 03/17/21   Florencia Reasons, MD  pantoprazole (PROTONIX) 40 MG tablet Take 1 tablet (40 mg total) by mouth 2 (two) times  daily. 03/17/21 04/16/21  Florencia Reasons, MD    Allergies Codeine, Other, and Prednisone  Family History  Problem Relation Age of Onset   Breast cancer Mother    Colon cancer Mother    Arthritis Mother    Lung cancer Father     Social History Social History   Tobacco Use   Smoking status: Former    Packs/day: 1.00    Years: 20.00    Pack years: 20.00    Types: Cigarettes    Quit date: 07/14/2006    Years since quitting: 14.7   Smokeless tobacco: Never  Vaping Use   Vaping Use: Former  Substance Use Topics   Alcohol use: Never   Drug use: Never    Review of Systems  Constitutional: No fever/chills Eyes: No visual changes. ENT: No sore throat. Cardiovascular: See HPI as to chest pain Respiratory: Denies shortness of breath. Gastrointestinal: Some right upper quadrant abdominal pain.  No nausea, no vomiting.  No diarrhea.  No constipation. Genitourinary: Negative for dysuria. Musculoskeletal: Negative for back pain. Skin: Negative for rash. Neurological: Negative for headaches, focal weakness   ____________________________________________   PHYSICAL EXAM:  VITAL SIGNS: ED Triage Vitals [04/16/21 1734]  Enc Vitals Group     BP 116/77     Pulse Rate 84     Resp 20     Temp 98.4 F (36.9 C)      Temp Source Oral     SpO2 98 %     Weight 125 lb (56.7 kg)     Height 5' 2.5" (1.588 m)     Head Circumference      Peak Flow      Pain Score 10     Pain Loc      Pain Edu?      Excl. in [place]?     Constitutional: Alert and oriented. Well appearing and in no acute distress. Eyes: Conjunctivae are normal.  Head: Atraumatic. Nose: No congestion/rhinnorhea. Mouth/Throat: Mucous membranes are moist. Neck: No stridor. Cardiovascular: Normal rate, regular rhythm. Grossly normal heart sounds.  Good peripheral circulation. Respiratory: Normal respiratory effort.  No retractions. Lungs CTAB. Gastrointestinal: Tender to palpation in the right upper quadrant and right middle abdomen.  There is no tenderness elsewhere.  No distention. No abdominal bruits. No CVA tenderness. Musculoskeletal: No lower extremity tenderness nor edema.   Neurologic:  Normal speech and language. No gross focal neurologic deficits are appreciated.  Skin:  Skin is warm, dry and intact. No rash noted.   ____________________________________________   LABS (all labs ordered are listed, but only abnormal results are displayed)  Labs Reviewed  BASIC METABOLIC PANEL - Abnormal; Notable for the following components:      Result Value   Glucose, Bld 104 (*)    Creatinine, Ser 1.01 (*)    All other components within normal limits  CBC WITH DIFFERENTIAL/PLATELET - Abnormal; Notable for the following components:   RBC 3.77 (*)    Hemoglobin 11.5 (*)    HCT 34.8 (*)    Platelets 442 (*)    All other components within normal limits   ____________________________________________  EKG   ____________________________________________  RADIOLOGY Gertha Calkin, personally viewed and evaluated these images (plain radiographs) as part of my medical decision making, as well as reviewing the written report by the radiologist.  ED MD interpretation: CT read by radiology reviewed by me does not show any problems in fact  the pneumonia looks almost completely gone to me.  The ultrasound also did not show anything acute going on either.  Official radiology report(s): DG Chest 2 View  Result Date: 04/16/2021 CLINICAL DATA:  Cough.  Right-sided rib pain. EXAM: CHEST - 2 VIEW COMPARISON:  Chest x-ray dated March 15, 2021. FINDINGS: The heart size and mediastinal contours are within normal limits. Normal pulmonary vascularity. Small nodular opacities in both lungs appear decreased in size compared to prior study. No pleural effusion or pneumothorax. No displaced rib fracture identified. IMPRESSION: 1. No displaced rib fracture identified.  No pneumothorax. 2. Resolving multifocal pneumonia. Continued follow-up to resolution is recommended. Electronically Signed   By: Titus Dubin M.D.   On: 04/16/2021 18:44   US Abdomen Complete  Result Date: 04/16/2021 CLINICAL DATA:  History of prior fall with rib fractures with increasing right-sided abdominal pain, initial encounter EXAM: ABDOMEN ULTRASOUND COMPLETE COMPARISON:  None. FINDINGS: Gallbladder: No gallstones or wall thickening visualized. No sonographic Murphy sign noted by sonographer. Common bile duct: Diameter: 4.7 mm Liver: Diffusely increased in echogenicity likely related to fatty infiltration. No focal mass is noted. Portal vein is patent on color Doppler imaging with normal direction of blood flow towards the liver. IVC: No abnormality visualized. Pancreas: Visualized portion unremarkable. Spleen: Size and appearance within normal limits. Right Kidney: Length: 9.1 cm. Echogenicity within normal limits. No mass or hydronephrosis visualized. Left Kidney: Length: 8.9 cm. Mass lesion or hydronephrosis is noted. Upper pole cyst measuring 1.7 cm is noted. Abdominal aorta: No aneurysm visualized. Other findings: None. IMPRESSION: Fatty liver. Left renal cyst. No other focal abnormality is noted. Electronically Signed   By: Inez Catalina M.D.   On: 04/16/2021 20:15     ____________________________________________   PROCEDURES  Procedure(s) performed (including Critical Care):  Procedures   ____________________________________________   INITIAL IMPRESSION / ASSESSMENT AND PLAN / ED COURSE  We will discharge the patient with some pain medicine for her resolving apparently rib pain.  Should add that she is not short of breath.  She is not tachycardic and has no other complaints but the pain which is worse when she moves or lays on the area as well.            ____________________________________________   FINAL CLINICAL IMPRESSION(S) / ED DIAGNOSES  Final diagnoses:  Rib pain     ED Discharge Orders          Ordered    HYDROcodone-acetaminophen (NORCO/VICODIN) 5-325 MG tablet  Every 6 hours PRN        04/16/21 2033             Note:  This document was prepared using Dragon voice recognition software and may include unintentional dictation errors.    Nena Polio, MD 04/17/21 0009    Nena Polio, MD 04/17/21 0010

## 2021-04-16 NOTE — ED Notes (Signed)
Pt was called for triage several times, she was down stairs getting change for the vending machine. Will call for her again shortly for triage.

## 2021-04-30 ENCOUNTER — Encounter: Payer: Self-pay | Admitting: Nurse Practitioner

## 2021-04-30 ENCOUNTER — Other Ambulatory Visit: Payer: Medicare HMO | Admitting: Nurse Practitioner

## 2021-04-30 ENCOUNTER — Other Ambulatory Visit: Payer: Self-pay

## 2021-04-30 DIAGNOSIS — Z515 Encounter for palliative care: Secondary | ICD-10-CM

## 2021-04-30 DIAGNOSIS — R630 Anorexia: Secondary | ICD-10-CM

## 2021-04-30 DIAGNOSIS — F419 Anxiety disorder, unspecified: Secondary | ICD-10-CM
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
## 2021-05-04 ENCOUNTER — Emergency Department
Admission: EM | Admit: 2021-05-04 | Discharge: 2021-05-04 | Discharge status: Against Medical Advice | Payer: Medicare HMO | Attending: Emergency Medicine | Admitting: Emergency Medicine

## 2021-05-04 ENCOUNTER — Encounter: Payer: Self-pay | Admitting: Emergency Medicine

## 2021-05-04 ENCOUNTER — Other Ambulatory Visit: Payer: Self-pay

## 2021-05-04 ENCOUNTER — Emergency Department: Payer: Medicare HMO

## 2021-05-04 DIAGNOSIS — Z5321 Procedure and treatment not carried out due to patient leaving prior to being seen by health care provider: Secondary | ICD-10-CM | POA: Insufficient documentation

## 2021-05-04 DIAGNOSIS — R059 Cough, unspecified: Secondary | ICD-10-CM | POA: Insufficient documentation

## 2021-05-04 DIAGNOSIS — R0602 Shortness of breath: Secondary | ICD-10-CM | POA: Diagnosis present

## 2021-05-04 LAB — CBC WITH DIFFERENTIAL/PLATELET
Abs Immature Granulocytes: 0.01 10*3/uL (ref 0.00–0.07)
Basophils Absolute: 0 10*3/uL (ref 0.0–0.1)
Basophils Relative: 0 %
Eosinophils Absolute: 0.1 10*3/uL (ref 0.0–0.5)
Eosinophils Relative: 1 %
HCT: 36.1 % (ref 36.0–46.0)
Hemoglobin: 11.7 g/dL — ABNORMAL LOW (ref 12.0–15.0)
Immature Granulocytes: 0 %
Lymphocytes Relative: 25 %
Lymphs Abs: 2 10*3/uL (ref 0.7–4.0)
MCH: 29.9 pg (ref 26.0–34.0)
MCHC: 32.4 g/dL (ref 30.0–36.0)
MCV: 92.3 fL (ref 80.0–100.0)
Monocytes Absolute: 0.5 10*3/uL (ref 0.1–1.0)
Monocytes Relative: 6 %
Neutro Abs: 5.3 10*3/uL (ref 1.7–7.7)
Neutrophils Relative %: 68 %
Platelets: 443 10*3/uL — ABNORMAL HIGH (ref 150–400)
RBC: 3.91 MIL/uL (ref 3.87–5.11)
RDW: 13.8 % (ref 11.5–15.5)
WBC: 7.9 10*3/uL (ref 4.0–10.5)
nRBC: 0 % (ref 0.0–0.2)

## 2021-05-04 LAB — COMPREHENSIVE METABOLIC PANEL
ALT: 9 U/L (ref 0–44)
AST: 23 U/L (ref 15–41)
Albumin: 4.3 g/dL (ref 3.5–5.0)
Alkaline Phosphatase: 85 U/L (ref 38–126)
Anion gap: 9 (ref 5–15)
BUN: 11 mg/dL (ref 6–20)
CO2: 28 mmol/L (ref 22–32)
Calcium: 9.4 mg/dL (ref 8.9–10.3)
Chloride: 100 mmol/L (ref 98–111)
Creatinine, Ser: 0.79 mg/dL (ref 0.44–1.00)
GFR, Estimated: >60 mL/min (ref >60)
Glucose, Bld: 89 mg/dL (ref 70–99)
Potassium: 3.5 mmol/L (ref 3.5–5.1)
Sodium: 137 mmol/L (ref 135–145)
Total Bilirubin: 0.4 mg/dL (ref 0.3–1.2)
Total Protein: 7.9 g/dL (ref 6.5–8.1)

## 2021-05-04 LAB — TROPONIN I (HIGH SENSITIVITY): Troponin I (High Sensitivity): <2 ng/L (ref <18)

## 2021-05-04 LAB — BRAIN NATRIURETIC PEPTIDE: B Natriuretic Peptide: 12.6 pg/mL (ref 0.0–100.0)

## 2021-05-04 MED ORDER — IPRATROPIUM-ALBUTEROL 0.5-2.5 (3) MG/3ML IN SOLN
3.0000 mL | Freq: Once | RESPIRATORY_TRACT | Status: AC
  Administered 2021-05-04: 3 mL via RESPIRATORY_TRACT
  Filled 2021-05-04: qty 3

## 2021-05-04 NOTE — ED Provider Notes (Signed)
Emergency Medicine Provider Triage Evaluation Note  Stacy Pearson , a 60 y.o. female  was evaluated in triage.  Pt complains of progressively worsening shortness of breath, wheezing and chest tightness.  Patient reports that she has a history of community-acquired pneumonia and recent rib fracture.  Patient states that she has had some fever and chills at home.  Review of Systems  Positive: Patient has cough, shortness of breath, wheezing Negative: No nausea, vomiting or abdominal pain.  Physical Exam  There were no vitals taken for this visit. Gen:   Awake, no distress   Resp:  Patient has mild increased work of breathing with diffuse expiratory wheezing bilaterally. MSK:   Moves extremities without difficulty  Other:    Medical Decision Making  Medically screening exam initiated at 5:51 PM.  Appropriate orders placed.  DEBORAH LAZCANO was informed that the remainder of the evaluation will be completed by another provider, this initial triage assessment does not replace that evaluation, and the importance of remaining in the ED until their evaluation is complete.     Vallarie Mare [place], PA-C 05/04/21 1753    Carrie Mew, MD 05/05/21 Laureen Abrahams

## 2021-05-04 NOTE — ED Triage Notes (Signed)
Pt via POV from home. Pt c/o SOB, cough, and generalized CP. Pt states it is painful to take a deep breath. Pt has hx of rib fx on the R side approx 5-6 weeks. Pt is A&Ox4 and NAD>

## 2021-05-04 NOTE — ED Triage Notes (Signed)
First Nurse Note:  C/O SOB, Cough, fever x 2-3 weeks. States recent diagnosed with right sided rib fractures.  Also recent pneumonia.  AAOx3.  Skin warm and dry.  No SOB/ DOE  NAD

## 2021-06-10 ENCOUNTER — Telehealth: Payer: Self-pay

## 2021-06-10 NOTE — Telephone Encounter (Signed)
06/10/21 @10 :30 AM: PC SW outreached patient per P NP- C.Gusler, to assess needs and discuss resources.  Call unsuccessful. Sw left HIPPA compliant VM with contact info. Awaiting return call.

## 2021-06-11 ENCOUNTER — Telehealth: Payer: Self-pay

## 2021-06-11 NOTE — Telephone Encounter (Signed)
Left voicemail for patient to let her know our palliative RN will be visiting 12/22 at 1pm instead of NP.  Also sent My Chart message to patient.

## 2021-06-25 ENCOUNTER — Telehealth: Payer: Self-pay

## 2021-06-25 NOTE — Telephone Encounter (Signed)
5 pm.  Phone call made to patient to confirm appointment for next week.   MyChart message was sent on 06/11/21 but no response.   I have called the [phone] and [phone] numbers listed but both are disconnected.  I have left a message on mother's phone and am waiting for a call back.

## 2021-06-26 ENCOUNTER — Telehealth: Payer: Self-pay

## 2021-06-26 NOTE — Telephone Encounter (Signed)
915 am.  Spoke with Christin Gusler, NP regarding inability to reach patient to confirm scheduled appt next week.  New number obtained for patient. [phone].  Message left requesting a call back to confirm next week's appt.

## 2021-06-27 ENCOUNTER — Other Ambulatory Visit: Payer: Self-pay

## 2021-06-27 ENCOUNTER — Other Ambulatory Visit: Payer: Medicare HMO

## 2021-06-27 DIAGNOSIS — Z515 Encounter for palliative care: Secondary | ICD-10-CM

## 2021-06-28 NOTE — Progress Notes (Signed)
°  Due to the COVID-19 crisis, this visit was done via telemedicine from my office and it was initiated and consent by this patient and or family.  I connected with  Stacy Pearson OR PROXY on 06/28/21 by a video enabled telemedicine application and verified that I am speaking with the correct person using two identifiers.   I discussed the limitations of evaluation and management by telemedicine. The patient expressed understanding and agreed to proceed.   245 pm.  Call back received from patient.  Advised of change in schedule for next week with Christin Gusler, NP.  Patient stated this visit should be telehealth visit with NP but she is okay to proceed today.  Patient states she has been dealing with some respiratory issues.  Denies recent visit with PCP and did not feel this was warranted.  She is self-medicating with mucinex, delsym and tylenol.  She is uncertain if has had any fevers.   Patient states her anxiety is stable at this time.  She endorses trying to get better and this is her primary focus.    Appetite remains poor and she believes patient there has been a 15-16 lb weight loss since her hospitalization. No supplement drinks at this time.  Patient does not wish to start any supplement without speaking to her providers.  She notes a kidney function of 48% and is trying to caution.  She is taking a multivitamin and fish oil at this time.  Patient endorses she just wants to feel better and is taking one day at a time.  She denies any needs at this time.  Patient is agreeable for a visit in February with NP.  Visit is scheduled.

## 2021-07-04 ENCOUNTER — Other Ambulatory Visit: Payer: Medicare HMO

## 2021-09-04 ENCOUNTER — Other Ambulatory Visit: Payer: Self-pay

## 2021-09-04 ENCOUNTER — Encounter: Payer: Self-pay | Admitting: Nurse Practitioner

## 2021-09-04 ENCOUNTER — Other Ambulatory Visit: Payer: Medicare HMO | Admitting: Nurse Practitioner

## 2021-09-04 DIAGNOSIS — F419 Anxiety disorder, unspecified: Secondary | ICD-10-CM

## 2021-09-04 DIAGNOSIS — R630 Anorexia: Secondary | ICD-10-CM

## 2021-09-04 DIAGNOSIS — Z515 Encounter for palliative care: Secondary | ICD-10-CM
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
## 2021-09-25 ENCOUNTER — Telehealth: Payer: Self-pay | Admitting: Nurse Practitioner

## 2021-09-25 NOTE — Telephone Encounter (Signed)
Ms. Guadalupe was requesting an in home primary provider. Information given for Alvester Chou NP [facility]. Ms. Burnett requested records be sent to [facility], done ?

## 2021-10-23 ENCOUNTER — Other Ambulatory Visit: Payer: Medicare HMO | Admitting: Nurse Practitioner

## 2021-10-23 ENCOUNTER — Encounter: Payer: Self-pay | Admitting: Nurse Practitioner

## 2021-10-23 DIAGNOSIS — G20 Parkinson's disease: Secondary | ICD-10-CM

## 2021-10-23 DIAGNOSIS — R630 Anorexia: Secondary | ICD-10-CM

## 2021-10-23 DIAGNOSIS — F419 Anxiety disorder, unspecified: Secondary | ICD-10-CM

## 2021-10-23 DIAGNOSIS — Z515 Encounter for palliative care: Secondary | ICD-10-CM
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
## 2021-12-24 ENCOUNTER — Other Ambulatory Visit: Payer: Medicare HMO | Admitting: Nurse Practitioner

## 2022-02-26 ENCOUNTER — Telehealth: Payer: Self-pay

## 2022-02-26 ENCOUNTER — Ambulatory Visit: Payer: Medicare HMO

## 2022-02-26 DIAGNOSIS — Z515 Encounter for palliative care: Secondary | ICD-10-CM

## 2022-02-26 NOTE — Telephone Encounter (Signed)
(  1145) PC SW outreached patient to complete PC check in.  Call unsuccessful. SW LVM with contact information,.

## 2022-02-26 NOTE — Progress Notes (Signed)
[facility] P[facility]ATIVE [facility] SW NOTE  PATIENT NAME: Stacy Pearson DOB: 1960/08/18 MRN: [id number]  PRIMARY CARE PROVIDER: Valera Castle, MD  RESPONSIBLE PARTY:  Acct ID - Guarantor Home Phone Work Phone Relationship Acct Type  [id number] - [name]* [phone] Self P/F     [address], [place], [place] [address]                                                PALLIATIVE CARE SW TELEPHONE CONTACT   Palliative care SW outreached patient to complete telephonic visit.    Patient provided update on medical condition and/or changes.  Patient endorses that today has not been a good day due to thinking about her failing kidney functions and the possibility of having to start dialysis again, more than like home dialysis due to her vision and cognition worsening. Patient states outside of these thoughts she is doing okay.   Hospitalizations: None recently  Recent doctors appointments: 7/31 w/PCP. Neoplasm of uncertain behavior of skin - Lesion of ala of nose  Patient is concerned about 2 skin lesions on her face that have been changing with time. She said she has had skin cancer that behave almost like the changes that she has been experiencing. Because of the video quality unable to fully examine those lesions. A referral to see dermatologist has been requested. The patient was encouraged to contact the dermatologist as she has been a patient there previously. Follow-up as needed. - Ambulatory Referral to Dermatology   Appetite: remains good. No significant weight changes noted.    Medications: no needs.    Psychosocial assessment: completed. No financial or food insecurities reported.   Mood: Patient reported feelings of anxiousness due to her medical complexities and testing that she has to endure. SW implanted reflective reciprocal listening and reviewed what patients current antianxiety coping mechanisms were. Patient shared that she continues to  see her psychiatrist and visits are beneficial. Denises thoughts or feelings of S/I or self harm. Patient has history of dementia as well as Bipolar Type I, possible parkinsonism, per neuro encounter on 7/25. [facility] Test to be scheduled to test for Parkinson's authorization for test was approved on 8/14. F/u with neuro, Dr. Manuella Ghazi on 04/01/22..   Transportation: patient shared that her vision nd cognition is worsening and may not be able to drive soon. Patient shared that she does not have any family/friends that will be able to transport her to doctors appointments. Patient has information for Medicaid medical transportation services but states she does not like them. SW advised patient that there are other transportation options but patient would be required to pay for the services OOP, patient stated understanding that this would be fine.     Next PC visit:  scheduled virtual visit with PC NP for 04/17/22 @ 1 PM.   Palliative care will continue to monitor and assist with long term care planning as needed..          SOCIAL HX:  Social History   Tobacco Use   Smoking status: Former    Packs/day: 1.00    Years: 20.00    Total pack years: 20.00    Types: Cigarettes    Quit date: 07/14/2006    Years since quitting: 15.6   Smokeless tobacco: Never  Substance Use Topics   Alcohol use:  Never    CODE STATUS: FULL CODE ADVANCED DIRECTIVES: N MOST FORM COMPLETE:  NEEDS TO BE DISCUSSED HOSPICE EDUCATION PROVIDED: N  PPS: 70%  Time spent: 20 min      [place] Henrene Pastor, [place]

## 2022-04-17 ENCOUNTER — Telehealth: Payer: Medicare HMO | Admitting: Nurse Practitioner

## 2022-05-12 ENCOUNTER — Encounter (INDEPENDENT_AMBULATORY_CARE_PROVIDER_SITE_OTHER): Payer: Self-pay

## 2022-09-02 IMAGING — CT CT RENAL STONE PROTOCOL
2 of 4 series · 17 of 46 positions shown, 19 images · non-contrast
Comparison: CT abdomen pelvis dated March 08, 2018.

CLINICAL DATA: Sepsis.  Renal failure.

EXAM:
CT ABDOMEN AND PELVIS WITHOUT CONTRAST
TECHNIQUE: Multidetector CT imaging of the abdomen and pelvis was performed
following the standard protocol without IV contrast.

[Series 2: stone full standard · axial · 0.77mm/px · z∈[-1124,-679]mm · 14 of 97 slices shown, 16 images]
[im 4/97  soft-tissue]
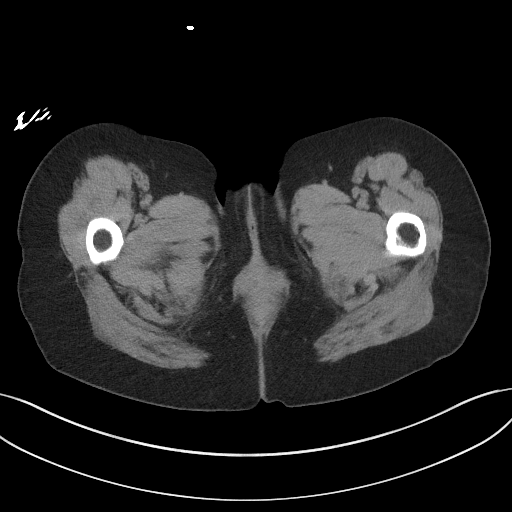
[im 4/97  bone]
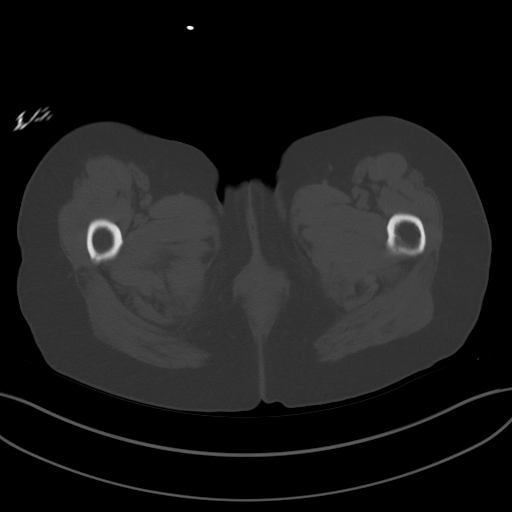
[im 12/97  soft-tissue]
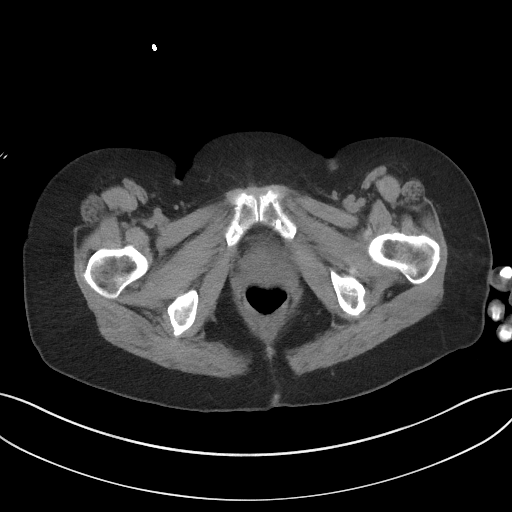
[im 20/97  soft-tissue]
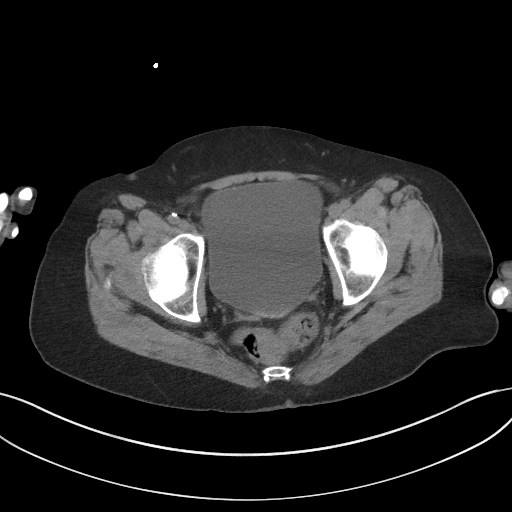
[im 27/97  soft-tissue]
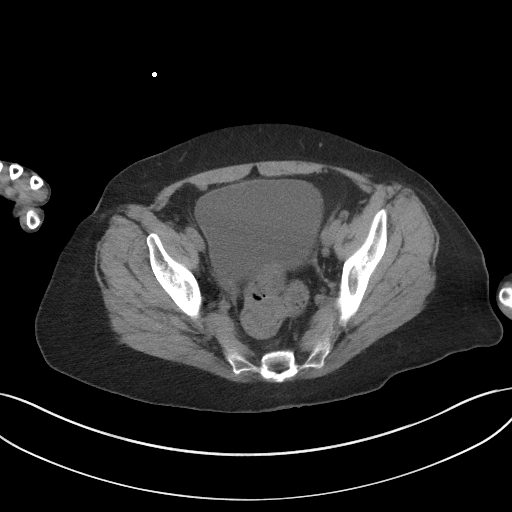
[im 31/97  soft-tissue]
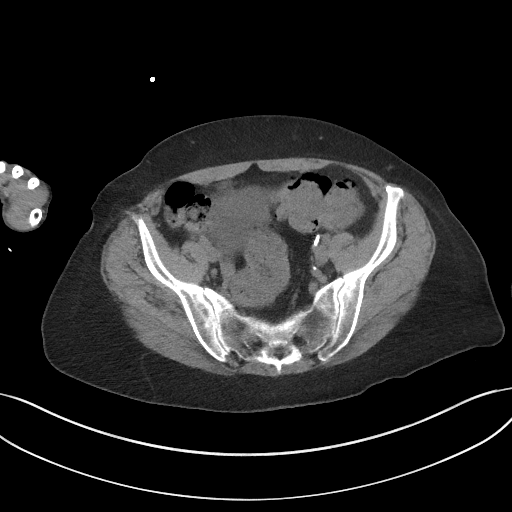
[im 39/97  soft-tissue]
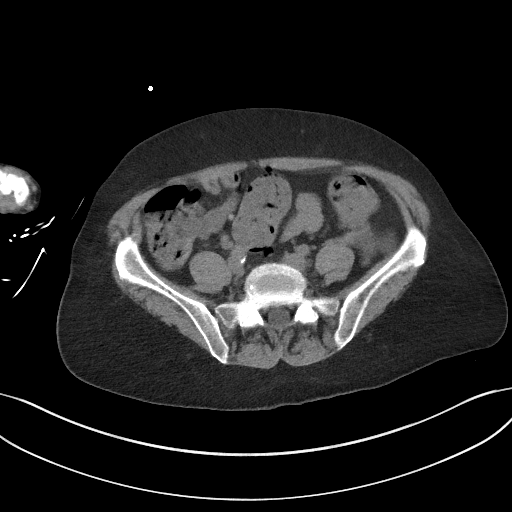
[im 47/97  soft-tissue]
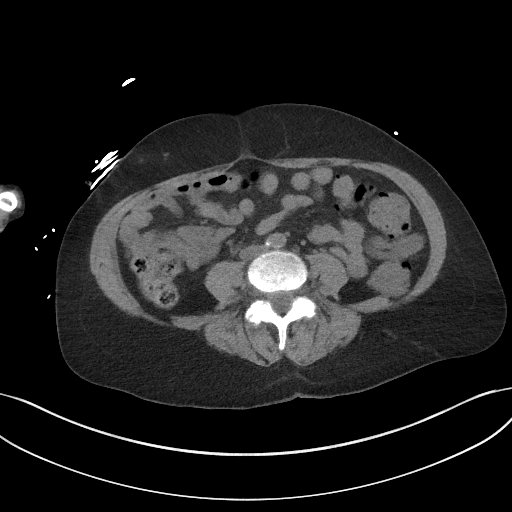
[im 50/97  soft-tissue]
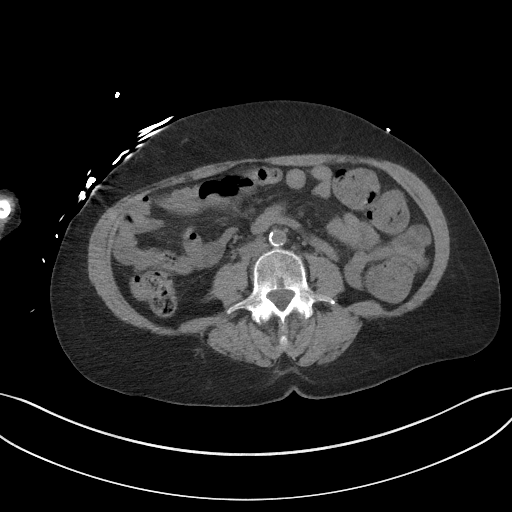
[im 58/97  soft-tissue]
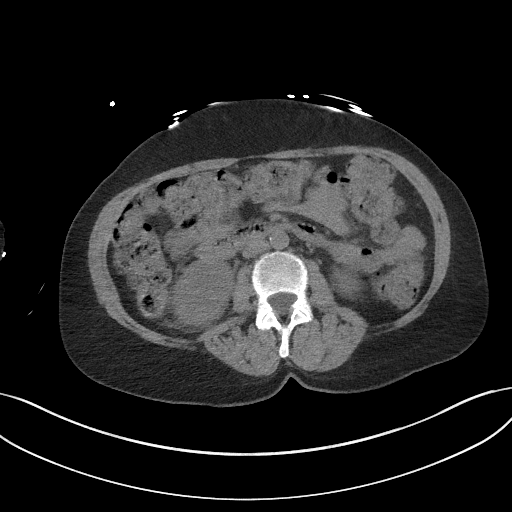
[im 58/97  bone]
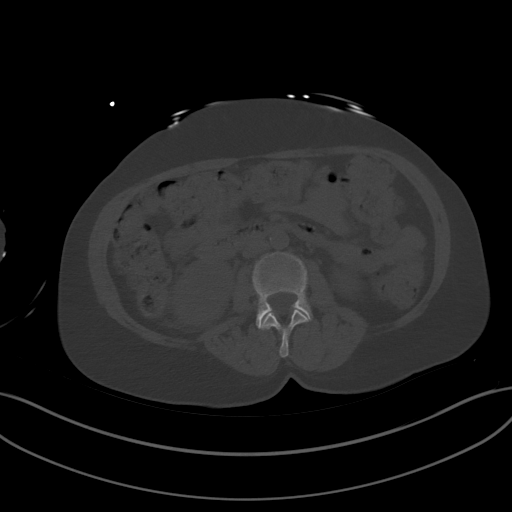
[im 66/97  soft-tissue]
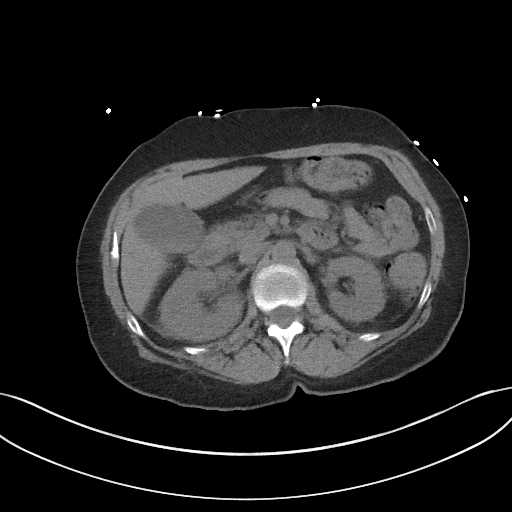
[im 73/97  soft-tissue]
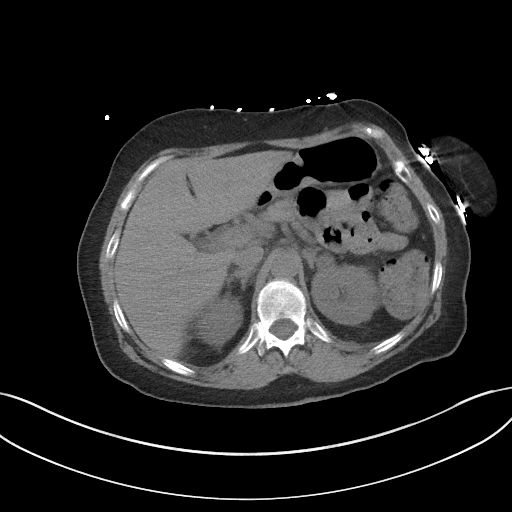
[im 77/97  soft-tissue]
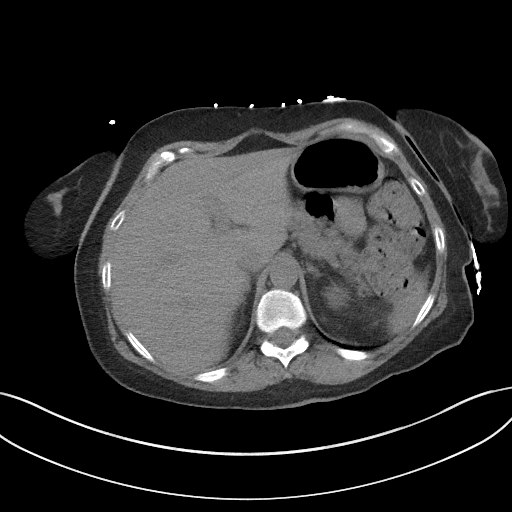
[im 85/97  soft-tissue]
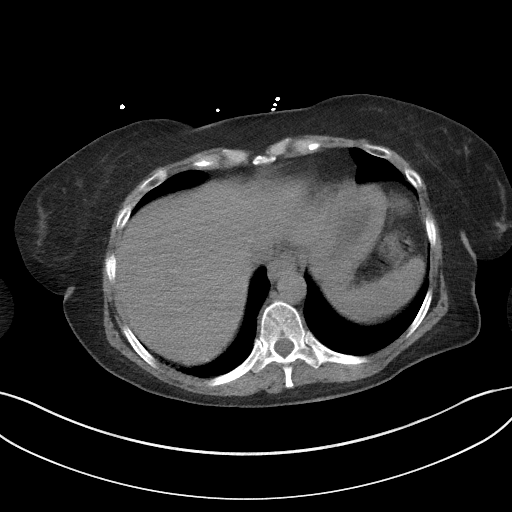
[im 93/97  soft-tissue]
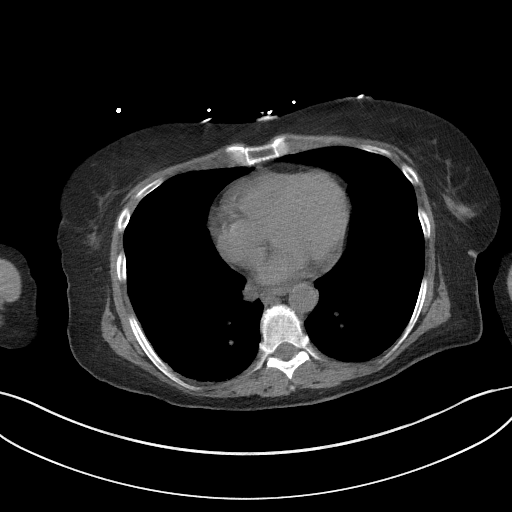

[Series 5: coronal · coronal · 0.98mm/px · 3 of 123 slices shown]
[im 41/123  soft-tissue]
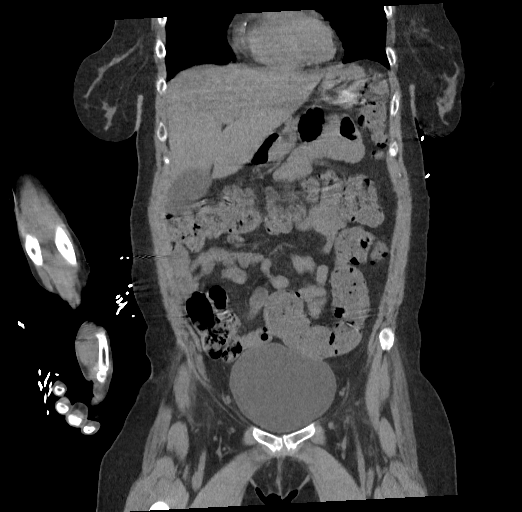
[im 55/123  soft-tissue]
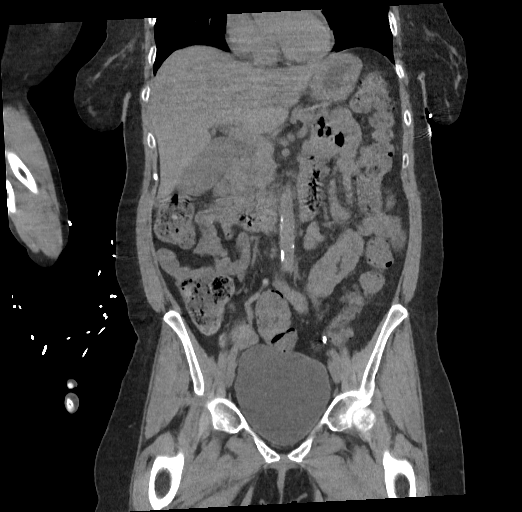
[im 68/123  soft-tissue]
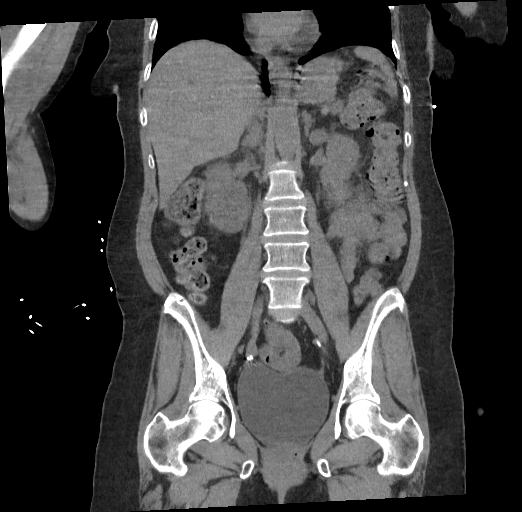

[17 of 46 positions shown; findings below may reference images not displayed]

FINDINGS: Lower chest: No acute abnormality.

Hepatobiliary: No focal liver abnormality. Layering sludge in the
gallbladder. No gallbladder wall thickening or biliary dilatation.

Pancreas: Unremarkable. No pancreatic ductal dilatation or
surrounding inflammatory changes.

Spleen: Normal in size without focal abnormality.

Adrenals/Urinary Tract: The adrenal glands are unremarkable.
Unchanged small left renal simple cysts. No renal calculi or
hydronephrosis. Small amount of layering hyperdensity in the
posterior bladder (series 2, image 79). No bladder wall thickening.

Stomach/Bowel: Stomach is within normal limits. Appendix appears
normal. No evidence of bowel wall thickening, distention, or
inflammatory changes.

Vascular/Lymphatic: Aortic atherosclerosis. No enlarged abdominal or
pelvic lymph nodes.

Reproductive: Status post hysterectomy. No adnexal masses.

Other: No abdominal wall hernia or abnormality. No abdominopelvic
ascites. No pneumoperitoneum.

Musculoskeletal: No acute or significant osseous findings.
IMPRESSION: 1. Small amount of layering hyperdensity in the posterior bladder
could reflect hemorrhage or tiny calculi. No obstructive uropathy.
2. Gallbladder sludge.
3. Aortic Atherosclerosis ([id number].X).

## 2022-09-04 IMAGING — RF DG FLUORO GUIDE SPINAL/SI JT INJ*R*
1 series · 1 of 1 positions shown · non-contrast
Comparison: none

CLINICAL DATA: Altered mental status

[Series 1: cp_standard · 0.18mm/px · 1 of 1 slices shown]
[im 1/1]
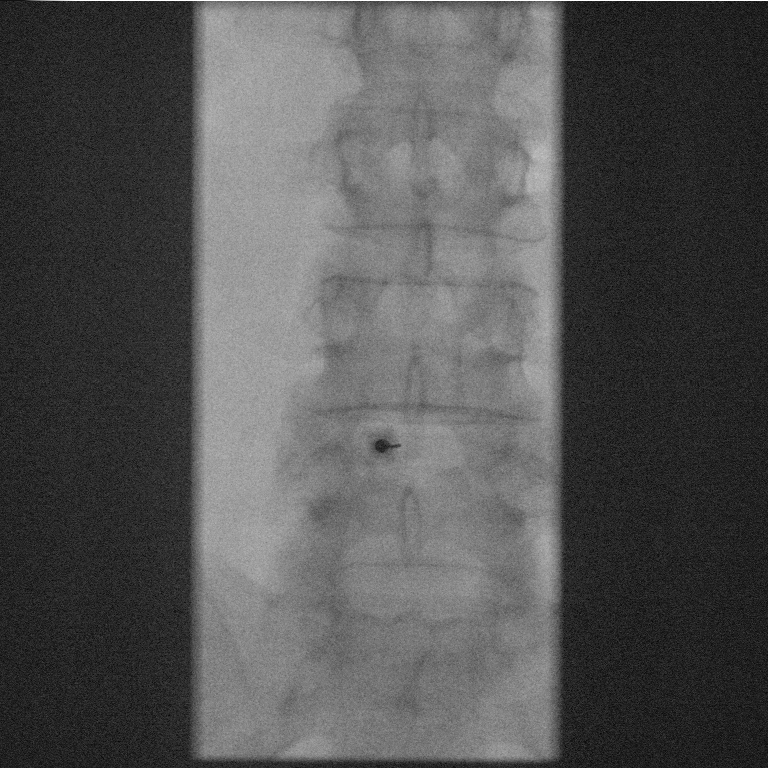

[1 of 1 positions shown; findings below may reference images not displayed]

EXAM:
DIAGNOSTIC LUMBAR PUNCTURE UNDER FLUOROSCOPIC GUIDANCE

FLUOROSCOPY TIME:  Fluoroscopy Time:  18 seconds

Radiation Exposure Index (if provided by the fluoroscopic device):
3.8 mGy

PROCEDURE:
Informed consent was obtained from the patient prior to the
procedure, including potential complications of headache, allergy,
and pain. With the patient prone, the lower back was prepped with
Betadine. 1% Lidocaine was used for local anesthesia. Lumbar
puncture was performed at the L4-L5 level using a 22 gauge needle
with return of clear CSF. 10 ml of CSF were obtained for laboratory
studies. The patient tolerated the procedure well and there were no
apparent complications.
IMPRESSION: Technically successful fluoroscopy guided lumbar puncture.

## 2022-09-05 IMAGING — DX DG CHEST 1V PORT
1 series · 1 of 1 positions shown · non-contrast
Comparison: 08/26/2020

CLINICAL DATA: Altered mental status with shortness of breath

EXAM:
PORTABLE CHEST 1 VIEW

[chest ap]
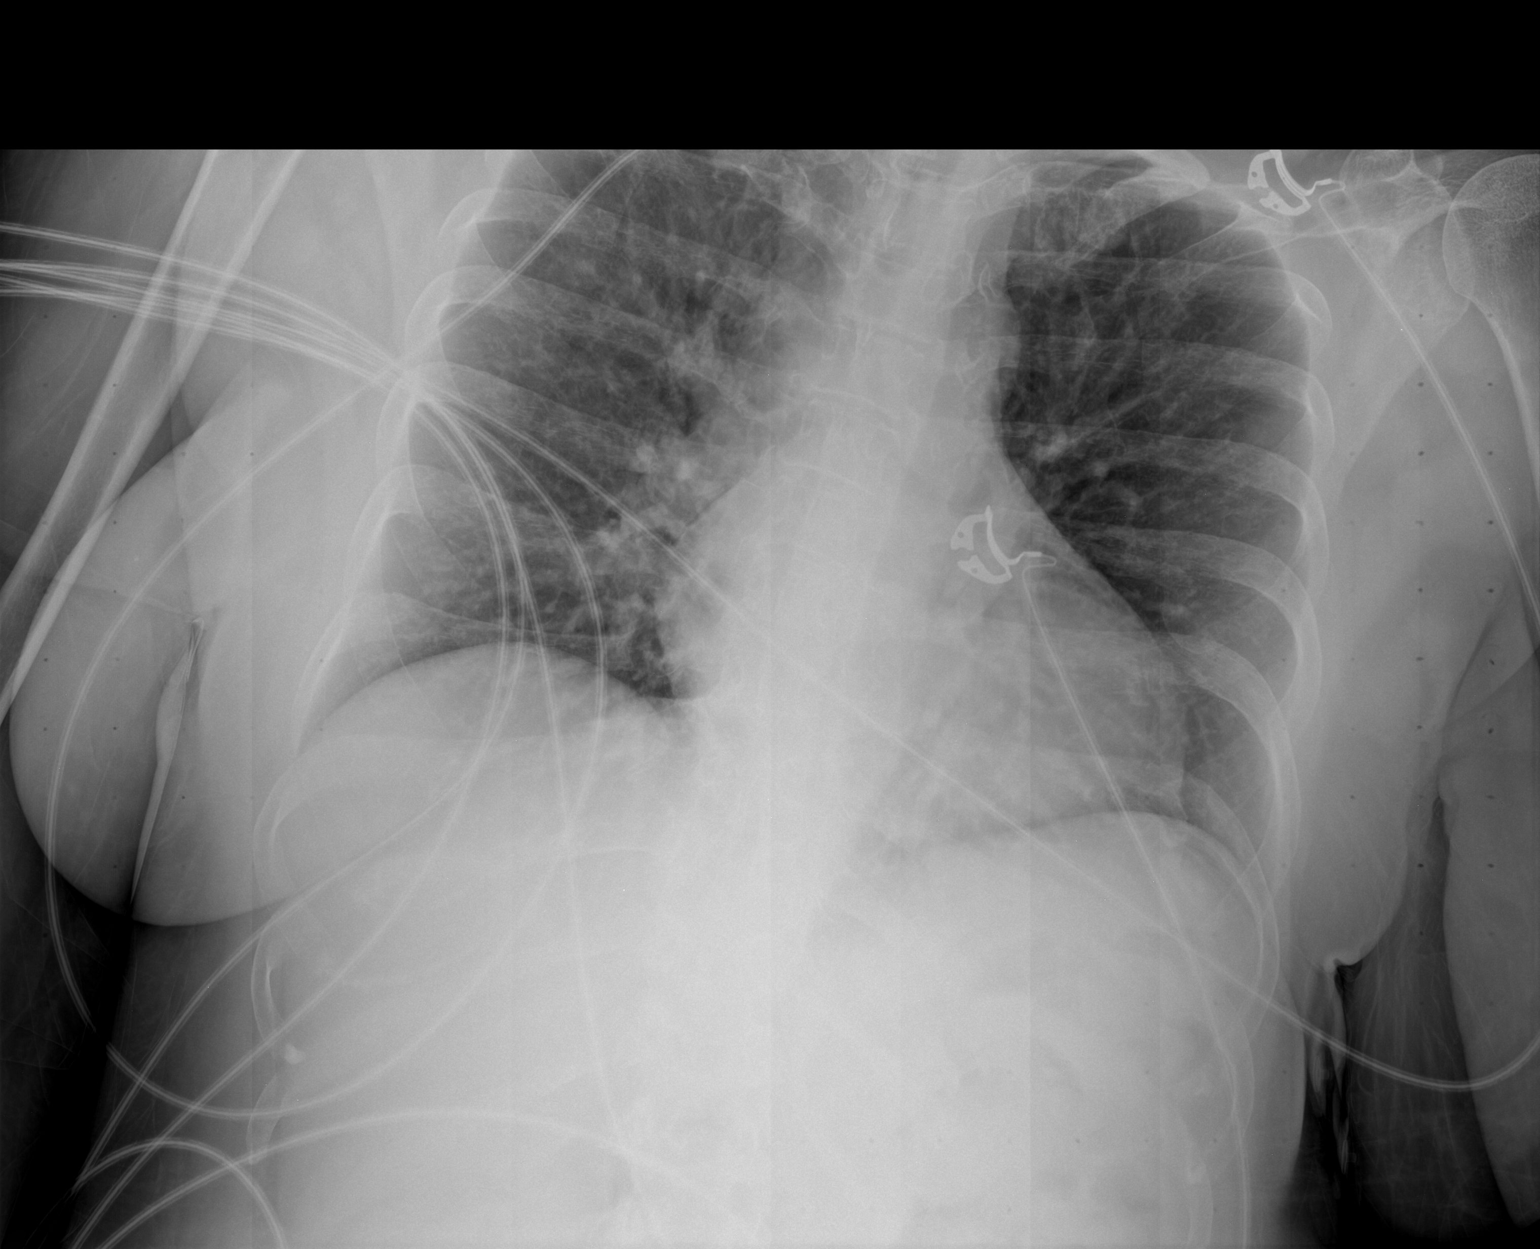

[1 of 1 positions shown; findings below may reference images not displayed]

FINDINGS: Low lung volumes. No consolidation or edema. No pleural effusion or
pneumothorax. Cardiomediastinal contours are within normal limits
for technique.
IMPRESSION: No acute process in the chest.

## 2023-10-21 ENCOUNTER — Other Ambulatory Visit: Payer: Self-pay | Admitting: Family Medicine

## 2023-10-21 DIAGNOSIS — R634 Abnormal weight loss: Secondary | ICD-10-CM

## 2023-10-21 DIAGNOSIS — E782 Mixed hyperlipidemia: Secondary | ICD-10-CM

## 2023-11-13 ENCOUNTER — Other Ambulatory Visit: Payer: Self-pay | Admitting: Family Medicine

## 2023-11-13 DIAGNOSIS — R634 Abnormal weight loss: Secondary | ICD-10-CM

## 2023-11-13 DIAGNOSIS — E782 Mixed hyperlipidemia: Secondary | ICD-10-CM

## 2023-11-26 ENCOUNTER — Ambulatory Visit
Admission: RE | Admit: 2023-11-26 | Discharge: 2023-11-26 | Disposition: A | Discharge status: Home / Self Care | Source: Ambulatory Visit | Attending: Family Medicine | Admitting: Family Medicine

## 2023-11-26 DIAGNOSIS — R634 Abnormal weight loss: Secondary | ICD-10-CM | POA: Insufficient documentation

## 2023-11-26 DIAGNOSIS — E782 Mixed hyperlipidemia: Secondary | ICD-10-CM | POA: Insufficient documentation

## 2023-11-26 DIAGNOSIS — Z6820 Body mass index (BMI) 20.0-20.9, adult: Secondary | ICD-10-CM | POA: Diagnosis not present

## 2023-11-26 DIAGNOSIS — K449 Diaphragmatic hernia without obstruction or gangrene: Secondary | ICD-10-CM | POA: Diagnosis not present

## 2023-11-26 DIAGNOSIS — R918 Other nonspecific abnormal finding of lung field: Secondary | ICD-10-CM | POA: Insufficient documentation

## 2023-11-26 DIAGNOSIS — I700 Atherosclerosis of aorta: Secondary | ICD-10-CM | POA: Diagnosis not present

## 2023-11-26 MED ORDER — IOHEXOL 300 MG/ML  SOLN
100.0000 mL | Freq: Once | INTRAMUSCULAR | Status: AC | PRN
  Administered 2023-11-26: 100 mL via INTRAVENOUS
